# Patient Record
Sex: Male | Born: 1940
Health system: Southern US, Community
[De-identification: ages and names within clinical notes are randomized; demographics above are authoritative.]

## PROBLEM LIST (undated history)

## (undated) ENCOUNTER — Emergency Department (HOSPITAL_COMMUNITY): Admission: EM | Payer: Medicare HMO | Source: Home / Self Care | Admitting: Urology

## (undated) DIAGNOSIS — E890 Postprocedural hypothyroidism: Secondary | ICD-10-CM

## (undated) DIAGNOSIS — N390 Urinary tract infection, site not specified: Secondary | ICD-10-CM

## (undated) DIAGNOSIS — I5020 Unspecified systolic (congestive) heart failure: Secondary | ICD-10-CM

## (undated) DIAGNOSIS — Z9009 Acquired absence of other part of head and neck: Secondary | ICD-10-CM

## (undated) DIAGNOSIS — I10 Essential (primary) hypertension: Secondary | ICD-10-CM

## (undated) DIAGNOSIS — I5032 Chronic diastolic (congestive) heart failure: Secondary | ICD-10-CM

## (undated) DIAGNOSIS — Z87442 Personal history of urinary calculi: Secondary | ICD-10-CM

## (undated) DIAGNOSIS — Z9089 Acquired absence of other organs: Secondary | ICD-10-CM

## (undated) DIAGNOSIS — Z9889 Other specified postprocedural states: Secondary | ICD-10-CM

## (undated) DIAGNOSIS — E039 Hypothyroidism, unspecified: Secondary | ICD-10-CM

## (undated) DIAGNOSIS — A419 Sepsis, unspecified organism: Secondary | ICD-10-CM

## (undated) DIAGNOSIS — I493 Ventricular premature depolarization: Secondary | ICD-10-CM

## (undated) DIAGNOSIS — R7303 Prediabetes: Secondary | ICD-10-CM

## (undated) DIAGNOSIS — J302 Other seasonal allergic rhinitis: Secondary | ICD-10-CM

## (undated) DIAGNOSIS — N400 Enlarged prostate without lower urinary tract symptoms: Secondary | ICD-10-CM

## (undated) DIAGNOSIS — C61 Malignant neoplasm of prostate: Secondary | ICD-10-CM

## (undated) DIAGNOSIS — N529 Male erectile dysfunction, unspecified: Secondary | ICD-10-CM

## (undated) DIAGNOSIS — E785 Hyperlipidemia, unspecified: Secondary | ICD-10-CM

## (undated) HISTORY — PX: PROSTATE SURGERY: SHX751

## (undated) HISTORY — DX: Unspecified systolic (congestive) heart failure: I50.20

## (undated) HISTORY — DX: Chronic diastolic (congestive) heart failure: I50.32

## (undated) HISTORY — DX: Urinary tract infection, site not specified: N39.0

## (undated) HISTORY — DX: Male erectile dysfunction, unspecified: N52.9

## (undated) HISTORY — DX: Hyperlipidemia, unspecified: E78.5

## (undated) HISTORY — DX: Essential (primary) hypertension: I10

## (undated) HISTORY — DX: Sepsis, unspecified organism: A41.9

## (undated) HISTORY — DX: Other seasonal allergic rhinitis: J30.2

## (undated) HISTORY — DX: Ventricular premature depolarization: I49.3

## (undated) HISTORY — DX: Malignant neoplasm of prostate: C61

## (undated) HISTORY — DX: Hypothyroidism, unspecified: E03.9

---

## 2001-10-14 HISTORY — PX: OTHER SURGICAL HISTORY: SHX169

## 2001-10-14 HISTORY — PX: THYROIDECTOMY: SHX17

## 2001-12-24 ENCOUNTER — Ambulatory Visit (HOSPITAL_COMMUNITY): Admission: RE | Admit: 2001-12-24 | Discharge: 2001-12-24 | Payer: Self-pay | Admitting: Family Medicine

## 2001-12-24 ENCOUNTER — Encounter: Payer: Self-pay | Admitting: Family Medicine

## 2001-12-29 ENCOUNTER — Ambulatory Visit (HOSPITAL_COMMUNITY): Admission: RE | Admit: 2001-12-29 | Discharge: 2001-12-29 | Payer: Self-pay | Admitting: Family Medicine

## 2001-12-29 ENCOUNTER — Encounter: Payer: Self-pay | Admitting: Family Medicine

## 2002-01-12 ENCOUNTER — Ambulatory Visit (HOSPITAL_COMMUNITY): Admission: RE | Admit: 2002-01-12 | Discharge: 2002-01-12 | Payer: Self-pay | Admitting: General Surgery

## 2002-01-13 ENCOUNTER — Encounter: Payer: Self-pay | Admitting: General Surgery

## 2002-03-16 ENCOUNTER — Encounter: Payer: Self-pay | Admitting: Internal Medicine

## 2002-03-16 ENCOUNTER — Inpatient Hospital Stay (HOSPITAL_COMMUNITY): Admission: AD | Admit: 2002-03-16 | Discharge: 2002-03-25 | Payer: Self-pay | Admitting: Internal Medicine

## 2002-03-17 ENCOUNTER — Encounter: Payer: Self-pay | Admitting: Internal Medicine

## 2002-03-20 ENCOUNTER — Encounter: Payer: Self-pay | Admitting: Internal Medicine

## 2003-01-31 ENCOUNTER — Ambulatory Visit (HOSPITAL_COMMUNITY): Admission: RE | Admit: 2003-01-31 | Discharge: 2003-01-31 | Payer: Self-pay | Admitting: Family Medicine

## 2003-01-31 ENCOUNTER — Encounter: Payer: Self-pay | Admitting: Family Medicine

## 2003-02-01 ENCOUNTER — Encounter: Payer: Self-pay | Admitting: Family Medicine

## 2003-02-01 ENCOUNTER — Ambulatory Visit (HOSPITAL_COMMUNITY): Admission: RE | Admit: 2003-02-01 | Discharge: 2003-02-01 | Payer: Self-pay | Admitting: Family Medicine

## 2004-09-28 ENCOUNTER — Ambulatory Visit: Payer: Self-pay | Admitting: Family Medicine

## 2004-11-21 ENCOUNTER — Ambulatory Visit: Payer: Self-pay | Admitting: Family Medicine

## 2005-01-02 ENCOUNTER — Ambulatory Visit: Payer: Self-pay | Admitting: Family Medicine

## 2005-05-16 ENCOUNTER — Ambulatory Visit (HOSPITAL_COMMUNITY): Admission: RE | Admit: 2005-05-16 | Discharge: 2005-05-16 | Payer: Self-pay | Admitting: General Surgery

## 2005-05-16 LAB — HM COLONOSCOPY: HM Colonoscopy: NORMAL

## 2005-06-03 ENCOUNTER — Ambulatory Visit: Payer: Self-pay | Admitting: Family Medicine

## 2005-07-23 ENCOUNTER — Ambulatory Visit: Payer: Self-pay | Admitting: Family Medicine

## 2005-11-22 ENCOUNTER — Ambulatory Visit: Payer: Self-pay | Admitting: Family Medicine

## 2006-04-17 ENCOUNTER — Encounter (INDEPENDENT_AMBULATORY_CARE_PROVIDER_SITE_OTHER): Payer: Self-pay | Admitting: *Deleted

## 2006-04-17 LAB — CONVERTED CEMR LAB: PSA: 3.42 ng/mL

## 2006-05-09 ENCOUNTER — Ambulatory Visit: Payer: Self-pay | Admitting: Family Medicine

## 2006-06-05 ENCOUNTER — Ambulatory Visit: Payer: Self-pay | Admitting: Family Medicine

## 2006-08-12 ENCOUNTER — Ambulatory Visit: Payer: Self-pay | Admitting: Family Medicine

## 2006-11-19 ENCOUNTER — Encounter: Payer: Self-pay | Admitting: Family Medicine

## 2006-11-19 LAB — CONVERTED CEMR LAB
ALT: 17 units/L (ref 0–53)
Basophils Absolute: 0 10*3/uL (ref 0.0–0.1)
CO2: 27 meq/L (ref 19–32)
Creatinine, Ser: 1.29 mg/dL (ref 0.40–1.50)
Eosinophils Absolute: 0.2 10*3/uL (ref 0.0–0.7)
HCT: 48.8 % (ref 39.0–52.0)
HDL: 49 mg/dL (ref >39)
Hemoglobin: 15.9 g/dL (ref 13.0–17.0)
LDL Cholesterol: 62 mg/dL (ref 0–99)
MCHC: 32.6 g/dL (ref 30.0–36.0)
Monocytes Absolute: 0.5 10*3/uL (ref 0.2–0.7)
Monocytes Relative: 8 % (ref 3–11)
Neutrophils Relative %: 60 % (ref 43–77)
TSH: 4.352 microintl units/mL (ref 0.350–5.50)
Total CHOL/HDL Ratio: 2.7
Triglycerides: 100 mg/dL (ref <150)
VLDL: 20 mg/dL (ref 0–40)

## 2006-11-25 ENCOUNTER — Ambulatory Visit: Payer: Self-pay | Admitting: Family Medicine

## 2006-12-04 ENCOUNTER — Ambulatory Visit: Payer: Self-pay | Admitting: Internal Medicine

## 2006-12-08 ENCOUNTER — Ambulatory Visit: Payer: Self-pay | Admitting: Internal Medicine

## 2006-12-08 ENCOUNTER — Ambulatory Visit (HOSPITAL_COMMUNITY): Admission: RE | Admit: 2006-12-08 | Discharge: 2006-12-08 | Payer: Self-pay | Admitting: Internal Medicine

## 2007-03-30 ENCOUNTER — Encounter: Payer: Self-pay | Admitting: Family Medicine

## 2007-03-30 LAB — CONVERTED CEMR LAB
BUN: 14 mg/dL
CO2: 26 meq/L
Calcium: 9 mg/dL
Chloride: 103 meq/L
Creatinine, Ser: 1.09 mg/dL
Glucose, Bld: 108 mg/dL — ABNORMAL HIGH
Potassium: 4.1 meq/L
Sodium: 141 meq/L
TSH: 3.889 u[IU]/mL

## 2007-04-07 ENCOUNTER — Ambulatory Visit: Payer: Self-pay | Admitting: Family Medicine

## 2007-07-09 ENCOUNTER — Encounter: Payer: Self-pay | Admitting: Family Medicine

## 2007-07-09 LAB — CONVERTED CEMR LAB
Albumin: 4.2 g/dL (ref 3.5–5.2)
BUN: 13 mg/dL (ref 6–23)
Calcium: 9.2 mg/dL (ref 8.4–10.5)
Glucose, Bld: 112 mg/dL — ABNORMAL HIGH (ref 70–99)
Indirect Bilirubin: 0.7 mg/dL (ref 0.0–0.9)
TSH: 7.244 microintl units/mL — ABNORMAL HIGH (ref 0.350–5.50)
Total Bilirubin: 0.8 mg/dL (ref 0.3–1.2)

## 2007-07-14 ENCOUNTER — Ambulatory Visit: Payer: Self-pay | Admitting: Family Medicine

## 2007-09-15 ENCOUNTER — Encounter: Payer: Self-pay | Admitting: Family Medicine

## 2007-09-15 LAB — CONVERTED CEMR LAB: TSH: 4.35 microintl units/mL (ref 0.350–5.50)

## 2007-10-15 ENCOUNTER — Encounter: Payer: Self-pay | Admitting: Family Medicine

## 2007-11-11 ENCOUNTER — Encounter: Payer: Self-pay | Admitting: Family Medicine

## 2007-11-11 LAB — CONVERTED CEMR LAB
AST: 16 units/L (ref 0–37)
Alkaline Phosphatase: 59 units/L (ref 39–117)
Bilirubin, Direct: 0.2 mg/dL (ref 0.0–0.3)
Calcium: 9.5 mg/dL (ref 8.4–10.5)
Chloride: 103 meq/L (ref 96–112)
Indirect Bilirubin: 0.7 mg/dL (ref 0.0–0.9)
Potassium: 4 meq/L (ref 3.5–5.3)
Total Bilirubin: 0.9 mg/dL (ref 0.3–1.2)
Triglycerides: 114 mg/dL (ref <150)
VLDL: 23 mg/dL (ref 0–40)

## 2007-11-13 ENCOUNTER — Encounter: Payer: Self-pay | Admitting: Family Medicine

## 2007-11-13 LAB — CONVERTED CEMR LAB: Glucose, 2 hour: 197 mg/dL — ABNORMAL HIGH (ref 70–139)

## 2007-11-17 ENCOUNTER — Ambulatory Visit: Payer: Self-pay | Admitting: Family Medicine

## 2007-12-29 ENCOUNTER — Ambulatory Visit: Payer: Self-pay | Admitting: Family Medicine

## 2007-12-29 ENCOUNTER — Encounter (INDEPENDENT_AMBULATORY_CARE_PROVIDER_SITE_OTHER): Payer: Self-pay | Admitting: *Deleted

## 2008-01-08 ENCOUNTER — Encounter (INDEPENDENT_AMBULATORY_CARE_PROVIDER_SITE_OTHER): Payer: Self-pay | Admitting: *Deleted

## 2008-01-08 DIAGNOSIS — I10 Essential (primary) hypertension: Secondary | ICD-10-CM | POA: Insufficient documentation

## 2008-01-08 DIAGNOSIS — J301 Allergic rhinitis due to pollen: Secondary | ICD-10-CM | POA: Insufficient documentation

## 2008-01-08 DIAGNOSIS — E785 Hyperlipidemia, unspecified: Secondary | ICD-10-CM | POA: Insufficient documentation

## 2008-01-08 DIAGNOSIS — E039 Hypothyroidism, unspecified: Secondary | ICD-10-CM | POA: Insufficient documentation

## 2008-01-08 DIAGNOSIS — F528 Other sexual dysfunction not due to a substance or known physiological condition: Secondary | ICD-10-CM

## 2008-03-16 ENCOUNTER — Encounter: Payer: Self-pay | Admitting: Family Medicine

## 2008-03-16 ENCOUNTER — Ambulatory Visit: Payer: Self-pay | Admitting: Family Medicine

## 2008-03-16 LAB — CONVERTED CEMR LAB
AST: 14 units/L (ref 0–37)
BUN: 19 mg/dL (ref 6–23)
Basophils Absolute: 0 10*3/uL (ref 0.0–0.1)
Basophils Relative: 1 % (ref 0–1)
Chloride: 105 meq/L (ref 96–112)
Cholesterol: 154 mg/dL (ref 0–200)
Creatinine, Ser: 1.22 mg/dL (ref 0.40–1.50)
Eosinophils Absolute: 0.2 10*3/uL (ref 0.0–0.7)
Eosinophils Relative: 5 % (ref 0–5)
HDL: 51 mg/dL (ref >39)
Hemoglobin: 15.2 g/dL (ref 13.0–17.0)
Indirect Bilirubin: 0.5 mg/dL (ref 0.0–0.9)
LDL Cholesterol: 85 mg/dL (ref 0–99)
MCHC: 31.7 g/dL (ref 30.0–36.0)
MCV: 85.3 fL (ref 78.0–100.0)
Monocytes Absolute: 0.3 10*3/uL (ref 0.1–1.0)
Neutro Abs: 2.6 10*3/uL (ref 1.7–7.7)
RDW: 14.7 % (ref 11.5–15.5)
Sodium: 144 meq/L (ref 135–145)
Total Bilirubin: 0.6 mg/dL (ref 0.3–1.2)
Total CHOL/HDL Ratio: 3

## 2008-05-19 ENCOUNTER — Encounter: Payer: Self-pay | Admitting: Family Medicine

## 2008-06-27 ENCOUNTER — Ambulatory Visit: Payer: Self-pay | Admitting: Family Medicine

## 2008-07-21 ENCOUNTER — Encounter: Payer: Self-pay | Admitting: Family Medicine

## 2008-07-26 ENCOUNTER — Encounter: Payer: Self-pay | Admitting: Family Medicine

## 2008-08-10 ENCOUNTER — Encounter: Payer: Self-pay | Admitting: Family Medicine

## 2008-08-11 ENCOUNTER — Encounter: Payer: Self-pay | Admitting: Family Medicine

## 2008-08-24 ENCOUNTER — Encounter: Payer: Self-pay | Admitting: Family Medicine

## 2008-09-14 ENCOUNTER — Encounter: Payer: Self-pay | Admitting: Family Medicine

## 2008-10-05 ENCOUNTER — Encounter: Payer: Self-pay | Admitting: Family Medicine

## 2008-10-21 ENCOUNTER — Encounter: Payer: Self-pay | Admitting: Family Medicine

## 2008-10-31 ENCOUNTER — Encounter: Payer: Self-pay | Admitting: Family Medicine

## 2008-10-31 LAB — CONVERTED CEMR LAB
ALT: 14 units/L (ref 0–53)
AST: 19 units/L (ref 0–37)
Alkaline Phosphatase: 61 units/L (ref 39–117)
BUN: 16 mg/dL (ref 6–23)
CO2: 21 meq/L (ref 19–32)
Calcium: 9.5 mg/dL (ref 8.4–10.5)
Chloride: 103 meq/L (ref 96–112)
Cholesterol: 150 mg/dL (ref 0–200)
Creatinine, Ser: 1.02 mg/dL (ref 0.40–1.50)
Glucose, Bld: 104 mg/dL — ABNORMAL HIGH (ref 70–99)
TSH: 3.176 microintl units/mL (ref 0.350–4.50)
Total Bilirubin: 1.3 mg/dL — ABNORMAL HIGH (ref 0.3–1.2)
Triglycerides: 84 mg/dL (ref <150)

## 2008-11-04 ENCOUNTER — Ambulatory Visit: Payer: Self-pay | Admitting: Family Medicine

## 2008-11-04 LAB — CONVERTED CEMR LAB
Glucose, Bld: 138 mg/dL
Hgb A1c MFr Bld: 6 %

## 2008-12-21 ENCOUNTER — Encounter: Payer: Self-pay | Admitting: Family Medicine

## 2009-02-02 ENCOUNTER — Encounter: Payer: Self-pay | Admitting: Family Medicine

## 2009-02-02 LAB — CONVERTED CEMR LAB: TSH: 2.599 microintl units/mL (ref 0.350–4.500)

## 2009-02-06 ENCOUNTER — Encounter: Payer: Self-pay | Admitting: Family Medicine

## 2009-02-07 ENCOUNTER — Encounter: Payer: Self-pay | Admitting: Family Medicine

## 2009-02-09 ENCOUNTER — Encounter: Payer: Self-pay | Admitting: Family Medicine

## 2009-03-08 ENCOUNTER — Encounter: Payer: Self-pay | Admitting: Family Medicine

## 2009-03-09 ENCOUNTER — Ambulatory Visit: Payer: Self-pay | Admitting: Family Medicine

## 2009-03-09 LAB — CONVERTED CEMR LAB: Blood Glucose, Fasting: 99 mg/dL

## 2009-03-10 ENCOUNTER — Encounter: Payer: Self-pay | Admitting: Family Medicine

## 2009-03-10 LAB — CONVERTED CEMR LAB: Microalb, Ur: 2.01 mg/dL — ABNORMAL HIGH (ref 0.00–1.89)

## 2009-05-02 ENCOUNTER — Encounter: Payer: Self-pay | Admitting: Family Medicine

## 2009-05-02 LAB — CONVERTED CEMR LAB
AST: 15 units/L (ref 0–37)
Basophils Relative: 1 % (ref 0–1)
Bilirubin, Direct: 0.2 mg/dL (ref 0.0–0.3)
Calcium: 8.9 mg/dL (ref 8.4–10.5)
Chloride: 104 meq/L (ref 96–112)
Eosinophils Relative: 4 % (ref 0–5)
Glucose, Bld: 99 mg/dL (ref 70–99)
HDL: 54 mg/dL (ref >39)
Hemoglobin: 15 g/dL (ref 13.0–17.0)
LDL Cholesterol: 73 mg/dL (ref 0–99)
Lymphs Abs: 1.9 10*3/uL (ref 0.7–4.0)
MCHC: 32.1 g/dL (ref 30.0–36.0)
MCV: 85.1 fL (ref 78.0–100.0)
Monocytes Absolute: 0.5 10*3/uL (ref 0.1–1.0)
Monocytes Relative: 8 % (ref 3–12)
Neutro Abs: 3.1 10*3/uL (ref 1.7–7.7)
Neutrophils Relative %: 54 % (ref 43–77)
Potassium: 4.3 meq/L (ref 3.5–5.3)
RBC: 5.49 M/uL (ref 4.22–5.81)
Total Bilirubin: 1 mg/dL (ref 0.3–1.2)
Total CHOL/HDL Ratio: 2.6
Triglycerides: 79 mg/dL (ref <150)
VLDL: 16 mg/dL (ref 0–40)
WBC: 5.7 10*3/uL (ref 4.0–10.5)

## 2009-06-05 ENCOUNTER — Encounter: Payer: Self-pay | Admitting: Family Medicine

## 2009-07-11 ENCOUNTER — Ambulatory Visit: Payer: Self-pay | Admitting: Family Medicine

## 2009-07-11 LAB — CONVERTED CEMR LAB
Glucose, Bld: 115 mg/dL
Hgb A1c MFr Bld: 6.2 %

## 2009-08-01 ENCOUNTER — Encounter: Payer: Self-pay | Admitting: Family Medicine

## 2009-08-02 ENCOUNTER — Encounter: Payer: Self-pay | Admitting: Family Medicine

## 2009-10-14 DIAGNOSIS — C61 Malignant neoplasm of prostate: Secondary | ICD-10-CM

## 2009-10-14 HISTORY — DX: Malignant neoplasm of prostate: C61

## 2009-10-17 ENCOUNTER — Ambulatory Visit: Payer: Self-pay | Admitting: Family Medicine

## 2009-10-17 LAB — CONVERTED CEMR LAB
Blood Glucose, Fasting: 117 mg/dL
Hgb A1c MFr Bld: 5.6 %

## 2009-10-18 LAB — CONVERTED CEMR LAB
ALT: 8 units/L (ref 0–53)
Calcium: 9.3 mg/dL (ref 8.4–10.5)
Cholesterol: 163 mg/dL (ref 0–200)
HDL: 60 mg/dL (ref >39)
Indirect Bilirubin: 0.8 mg/dL (ref 0.0–0.9)
LDL Cholesterol: 90 mg/dL (ref 0–99)
Potassium: 4.2 meq/L (ref 3.5–5.3)
TSH: 3.425 microintl units/mL (ref 0.350–4.500)
Total CHOL/HDL Ratio: 2.7
Total Protein: 7.2 g/dL (ref 6.0–8.3)
Triglycerides: 67 mg/dL (ref <150)
VLDL: 13 mg/dL (ref 0–40)

## 2009-10-24 DIAGNOSIS — H6120 Impacted cerumen, unspecified ear: Secondary | ICD-10-CM | POA: Insufficient documentation

## 2009-11-16 ENCOUNTER — Ambulatory Visit (HOSPITAL_COMMUNITY): Admission: RE | Admit: 2009-11-16 | Discharge: 2009-11-16 | Payer: Self-pay | Admitting: Family Medicine

## 2009-12-12 ENCOUNTER — Ambulatory Visit: Admission: RE | Admit: 2009-12-12 | Discharge: 2010-03-12 | Payer: Self-pay | Admitting: Radiation Oncology

## 2010-01-22 ENCOUNTER — Encounter: Payer: Self-pay | Admitting: Family Medicine

## 2010-03-19 ENCOUNTER — Encounter: Payer: Self-pay | Admitting: Family Medicine

## 2010-03-21 ENCOUNTER — Ambulatory Visit: Payer: Self-pay | Admitting: Family Medicine

## 2010-03-21 DIAGNOSIS — R5383 Other fatigue: Secondary | ICD-10-CM

## 2010-03-21 DIAGNOSIS — R5381 Other malaise: Secondary | ICD-10-CM

## 2010-03-21 LAB — CONVERTED CEMR LAB
AST: 14 units/L (ref 0–37)
Albumin: 4.1 g/dL (ref 3.5–5.2)
BUN: 14 mg/dL (ref 6–23)
Basophils Absolute: 0 10*3/uL (ref 0.0–0.1)
Basophils Relative: 0 % (ref 0–1)
Cholesterol: 132 mg/dL (ref 0–200)
Creatinine, Ser: 1.16 mg/dL (ref 0.40–1.50)
Creatinine, Urine: 219.7 mg/dL
Eosinophils Absolute: 0.1 10*3/uL (ref 0.0–0.7)
Eosinophils Relative: 1 % (ref 0–5)
HCT: 43.7 % (ref 39.0–52.0)
HDL: 52 mg/dL (ref >39)
Hemoglobin: 13.9 g/dL (ref 13.0–17.0)
Hgb A1c MFr Bld: 6 % — ABNORMAL HIGH (ref <5.7)
MCV: 87.4 fL (ref 78.0–100.0)
Monocytes Absolute: 0.4 10*3/uL (ref 0.1–1.0)
Neutro Abs: 5.8 10*3/uL (ref 1.7–7.7)
Neutrophils Relative %: 81 % — ABNORMAL HIGH (ref 43–77)
RBC: 5 M/uL (ref 4.22–5.81)
Total Bilirubin: 0.8 mg/dL (ref 0.3–1.2)
Total CHOL/HDL Ratio: 2.5
Total Protein: 7.3 g/dL (ref 6.0–8.3)
VLDL: 16 mg/dL (ref 0–40)
Vit D, 25-Hydroxy: 29 ng/mL — ABNORMAL LOW (ref 30–89)

## 2010-03-21 LAB — HM DIABETES FOOT EXAM

## 2010-03-25 DIAGNOSIS — C61 Malignant neoplasm of prostate: Secondary | ICD-10-CM

## 2010-05-08 ENCOUNTER — Telehealth: Payer: Self-pay | Admitting: Family Medicine

## 2010-07-11 ENCOUNTER — Encounter: Payer: Self-pay | Admitting: Family Medicine

## 2010-07-18 ENCOUNTER — Telehealth: Payer: Self-pay | Admitting: Family Medicine

## 2010-07-24 ENCOUNTER — Encounter: Payer: Self-pay | Admitting: Family Medicine

## 2010-07-25 ENCOUNTER — Ambulatory Visit: Payer: Self-pay | Admitting: Family Medicine

## 2010-07-25 DIAGNOSIS — R7303 Prediabetes: Secondary | ICD-10-CM

## 2010-07-26 LAB — CONVERTED CEMR LAB
BUN: 8 mg/dL (ref 6–23)
CO2: 27 meq/L (ref 19–32)
Cholesterol: 137 mg/dL (ref 0–200)
LDL Cholesterol: 64 mg/dL (ref 0–99)
Sodium: 141 meq/L (ref 135–145)
Total Bilirubin: 0.8 mg/dL (ref 0.3–1.2)
Triglycerides: 66 mg/dL (ref <150)
VLDL: 13 mg/dL (ref 0–40)

## 2010-08-23 ENCOUNTER — Telehealth: Payer: Self-pay | Admitting: Family Medicine

## 2010-09-20 ENCOUNTER — Encounter: Payer: Self-pay | Admitting: Family Medicine

## 2010-10-29 ENCOUNTER — Ambulatory Visit
Admission: RE | Admit: 2010-10-29 | Discharge: 2010-10-29 | Payer: Self-pay | Source: Home / Self Care | Attending: Family Medicine | Admitting: Family Medicine

## 2010-10-29 DIAGNOSIS — N39498 Other specified urinary incontinence: Secondary | ICD-10-CM | POA: Insufficient documentation

## 2010-11-07 LAB — CONVERTED CEMR LAB: TSH: 2.363 microintl units/mL (ref 0.350–4.500)

## 2010-11-13 NOTE — Letter (Signed)
Summary: MEDICAL RELEASE  MEDICAL RELEASE   Imported By: Lind Guest 01/22/2010 13:22:05  _____________________________________________________________________  External Attachment:    Type:   Image     Comment:   External Document

## 2010-11-13 NOTE — Letter (Signed)
Summary: labs  labs   Imported By: Curtis Sites 03/02/2010 12:45:01  _____________________________________________________________________  External Attachment:    Type:   Image     Comment:   External Document

## 2010-11-13 NOTE — Letter (Signed)
Summary: consults  consults   Imported By: Curtis Sites 03/02/2010 12:37:37  _____________________________________________________________________  External Attachment:    Type:   Image     Comment:   External Document

## 2010-11-13 NOTE — Miscellaneous (Signed)
Summary: samples  Clinical Lists Changes     viagra samples given lot [id number] exp 07/15

## 2010-11-13 NOTE — Progress Notes (Signed)
Summary: medication  Phone Note Call from Patient   Summary of Call: [facility]IENT [facility] klor con m20  20 meq, he would like the 90 day supply for this  their number is [phone], [phone]  Initial call taken by: Curtis Sites,  August 23, 2010 3:13 PM  Follow-up for Phone Call        prescription entered historically, pls print and fax tioo the phar of his choice after you spk with him Follow-up by: Syliva Overman MD,  August 23, 2010 5:23 PM    New/Updated Medications: KLOR-CON M20 20 MEQ CR-TABS (POTASSIUM CHLORIDE CRYS CR) Take 1 tablet by mouth once a day Prescriptions: KLOR-CON M20 20 MEQ CR-TABS (POTASSIUM CHLORIDE CRYS CR) Take 1 tablet by mouth once a day  #90 x 3   Entered by:   Adella Hare LPN   Authorized by:   Syliva Overman MD   Signed by:   Adella Hare LPN on 84/69/6295   Method used:   Faxed to ...       [facility]             , [place]         Ph:        Fax: [phone]   RxID:   [id number] KLOR-CON M20 20 MEQ CR-TABS (POTASSIUM CHLORIDE CRYS CR) Take 1 tablet by mouth once a day  #90 x 3   Entered and Authorized by:   Syliva Overman MD   Signed by:   Syliva Overman MD on 08/23/2010   Method used:   Historical   RxID:   [id number]

## 2010-11-13 NOTE — Miscellaneous (Signed)
  Clinical Lists Changes  Medications: Removed medication of METFORMIN HCL 500 MG  TB24 (METFORMIN HCL) one tab by mouth once daily - Signed Added new medication of METFORMIN HCL 500 MG TABS (METFORMIN HCL) Take 1 tablet by mouth once a day - Signed Rx of METFORMIN HCL 500 MG TABS (METFORMIN HCL) Take 1 tablet by mouth once a day;  #90 x 3;  Signed;  Entered by: Syliva Overman MD;  Authorized by: Syliva Overman MD;  Method used: Printed then faxed to [facility], , , [place]  , Ph: , Fax: [phone] Rx of METFORMIN HCL 500 MG TABS (METFORMIN HCL) Take 1 tablet by mouth once a day;  #90 x 3;  Signed;  Entered by: Everitt Amber LPN;  Authorized by: Syliva Overman MD;  Method used: Faxed to [facility], , , [place]  , Ph: [phone], Fax: [phone]    Prescriptions: METFORMIN HCL 500 MG TABS (METFORMIN HCL) Take 1 tablet by mouth once a day  #90 x 3   Entered by:   Everitt Amber LPN   Authorized by:   Syliva Overman MD   Signed by:   Everitt Amber LPN on 75/64/3329   Method used:   Faxed to ...       [facility] (mail-order)             , [place]         Ph: [phone]       Fax: [phone]   RxID:   [id number] METFORMIN HCL 500 MG TABS (METFORMIN HCL) Take 1 tablet by mouth once a day  #90 x 3   Entered and Authorized by:   Syliva Overman MD   Signed by:   Syliva Overman MD on 07/24/2010   Method used:   Printed then faxed to ...       [facility]             , [place]         Ph:        Fax: [phone]   RxID:   [id number]  pls fax new rx for metformin to [facility] and notify thanks

## 2010-11-13 NOTE — Letter (Signed)
Summary: progress notes  progress notes   Imported By: Curtis Sites 03/02/2010 12:36:17  _____________________________________________________________________  External Attachment:    Type:   Image     Comment:   External Document

## 2010-11-13 NOTE — Assessment & Plan Note (Signed)
Summary: F UP   Vital Signs:  Patient profile:   70 year old male Height:      65 inches Weight:      158.50 pounds BMI:     26.47 O2 Sat:      98 % Pulse rate:   92 / minute Pulse rhythm:   regular Resp:     16 per minute BP sitting:   116 / 82  (left arm) Cuff size:   regular  Vitals Entered By: Syliva Overman MD (March 21, 2010 10:17 AM)  Nutrition Counseling: Patient's BMI is greater than 25 and therefore counseled on weight management options. CC: Follow up chronic problems, had some radiation treatment and still having the burning around his prostate but they say every thing is good   Primary Care Provider:  Kerri Perches MD  CC:  Follow up chronic problems and had some radiation treatment and still having the burning around his prostate but they say every thing is good.  History of Present Illness: pt reports thart he just completed an 8 week radiation treatment schedule of radiation for prostate cancer and c/o burning with both urination and defecation, no fever or chills and frequent urination . states that he was told this was expeected and would continue for a few more months. he has been weak, apetite fluictuates  Current Medications (verified): 1)  Klor-Con M20 20 Meq  Tbcr (Potassium Chloride Crys Cr) .... One Tab By Mouth Once Daily 2)  Hydrochlorothiazide 25 Mg  Tabs (Hydrochlorothiazide) .... One Tab By Mouth Once Daily 3)  Norvasc 10 Mg  Tabs (Amlodipine Besylate) .... One Tab By Mouth Once Daily 4)  Simvastatin 20 Mg  Tabs (Simvastatin) .... One Tab By Mouth At Bedtime 5)  Synthroid 75 Mcg  Tabs (Levothyroxine Sodium) .... One Tab By Mouth Once Daily 6)  Metformin Hcl 500 Mg  Tb24 (Metformin Hcl) .... One Tab By Mouth Once Daily 7)  Zyrtec Allergy 10 Mg  Tabs (Cetirizine Hcl) .... One Tab By Mouth Once Daily 8)  Multivitamins  Tabs (Multiple Vitamin) .... One Tab By Mouth Once Daily 9)  Flomax 0.4 Mg Caps (Tamsulosin Hcl) .... Take 1 Tablet By Mouth  Once A Day  Allergies (verified): 1)  ! Pcn 2)  ! * Eggs  Past History:  Past Medical History: Current Problems:  ALLERGIC RHINITIS, SEASONAL (ICD-477.0) ERECTILE DYSFUNCTION (ICD-302.72) UNSPECIFIED HYPOTHYROIDISM (ICD-244.9) HYPERLIPIDEMIA (ICD-272.4) HYPERTENSION (ICD-401.9) DIABETES MELLITUS, TYPE II (ICD-250.00) Prostate cancer dx 2011treated with radiaition  Review of Systems      See HPI General:  Complains of fatigue and malaise; denies chills and fever. Eyes:  Denies discharge and red eye. ENT:  Denies hoarseness, nasal congestion, and sinus pressure. CV:  Denies chest pain or discomfort, difficulty breathing while lying down, palpitations, shortness of breath with exertion, and swelling of feet. Resp:  Denies cough and sputum productive. GI:  Complains of loss of appetite; denies abdominal pain, constipation, diarrhea, nausea, and vomiting. GU:  Denies dysuria, urinary frequency, and urinary hesitancy. MS:  Complains of joint pain and stiffness. Derm:  Denies itching and rash. Neuro:  Denies headaches and seizures. Psych:  Denies anxiety and depression. Endo:  Denies cold intolerance, excessive hunger, excessive thirst, excessive urination, heat intolerance, polyuria, and weight change. Heme:  Denies abnormal bruising and bleeding. Allergy:  Denies hives or rash and itching eyes.  Physical Exam  General:  Well-developed,well-nourished,in no acute distress; alert,appropriate and cooperative throughout examination HEENT: No facial asymmetry,  EOMI, No sinus tenderness, cerumen i[paction, oropharynx  pink and moist.   Chest: Clear to auscultation bilaterally.  CVS: S1, S2, No murmurs, No S3.   Abd: Soft, Nontender.  MS: Adequate ROM spine, hips, shoulders and knees.  Ext: No edema.   CNS: CN 2-12 intact, power tone and sensation normal throughout.   Skin: Intact, no visible lesions or rashes.  Psych: Good eye contact, normal affect.  Memory intact, not anxious or  depressed appearing.    Diabetes Management Exam:    Foot Exam (with socks and/or shoes not present):       Sensory-Monofilament:          Left foot: normal          Right foot: normal       Inspection:          Left foot: normal          Right foot: normal       Nails:          Left foot: thickened          Right foot: thickened   Impression & Recommendations:  Problem # 1:  UNSPECIFIED HYPOTHYROIDISM (ICD-244.9) Assessment Comment Only  His updated medication list for this problem includes:    Synthroid 75 Mcg Tabs (Levothyroxine sodium) ..... One tab by mouth once daily  Labs Reviewed: TSH: 3.425 (10/17/2009)    HgBA1c: 5.6 (10/17/2009) Chol: 163 (10/17/2009)   HDL: 60 (10/17/2009)   LDL: 90 (10/17/2009)   TG: 67 (10/17/2009)  Problem # 2:  DIABETES MELLITUS, TYPE II (ICD-250.00) Assessment: Comment Only  His updated medication list for this problem includes:    Metformin Hcl 500 Mg Tb24 (Metformin hcl) ..... One tab by mouth once daily  Orders: T-Urine Microalbumin w/creat. ratio [phone])  Labs Reviewed: Creat: 1.09 (10/17/2009)    Reviewed HgBA1c results: 5.6 (10/17/2009)  6.2 (07/11/2009)  Problem # 3:  HYPERTENSION (ICD-401.9) Assessment: Unchanged  His updated medication list for this problem includes:    Hydrochlorothiazide 25 Mg Tabs (Hydrochlorothiazide) ..... One tab by mouth once daily    Norvasc 10 Mg Tabs (Amlodipine besylate) ..... One tab by mouth once daily  Orders: T-Basic Metabolic Panel ([id number])  BP today: 116/82 Prior BP: 110/86 (10/17/2009)  Labs Reviewed: K+: 4.2 (10/17/2009) Creat: : 1.09 (10/17/2009)   Chol: 163 (10/17/2009)   HDL: 60 (10/17/2009)   LDL: 90 (10/17/2009)   TG: 67 (10/17/2009)  Problem # 4:  HYPERLIPIDEMIA (ICD-272.4) Assessment: Comment Only  His updated medication list for this problem includes:    Simvastatin 20 Mg Tabs (Simvastatin) ..... One tab by mouth at bedtime  Orders: T-Hepatic  Function [phone]) T-Lipid Profile [phone])  Labs Reviewed: SGOT: 13 (10/17/2009)   SGPT: 8 (10/17/2009)   HDL:60 (10/17/2009), 54 (05/02/2009)  LDL:90 (10/17/2009), 73 (05/02/2009)  Chol:163 (10/17/2009), 143 (05/02/2009)  Trig:67 (10/17/2009), 79 (05/02/2009)  Problem # 5:  PROSTATE CANCER (ICD-185) Assessment: Comment Only dx and treeated with s weeks of radiaition therapy in 2011, followed by urology and oncology  Complete Medication List: 1)  Klor-con M20 20 Meq Tbcr (Potassium chloride crys cr) .... One tab by mouth once daily 2)  Hydrochlorothiazide 25 Mg Tabs (Hydrochlorothiazide) .... One tab by mouth once daily 3)  Norvasc 10 Mg Tabs (Amlodipine besylate) .... One tab by mouth once daily 4)  Simvastatin 20 Mg Tabs (Simvastatin) .... One tab by mouth at bedtime 5)  Synthroid 75 Mcg Tabs (Levothyroxine sodium) .... One tab by mouth once daily  6)  Metformin Hcl 500 Mg Tb24 (Metformin hcl) .... One tab by mouth once daily 7)  Zyrtec Allergy 10 Mg Tabs (Cetirizine hcl) .... One tab by mouth once daily 8)  Multivitamins Tabs (Multiple vitamin) .... One tab by mouth once daily 9)  Flomax 0.4 Mg Caps (Tamsulosin hcl) .... Take 1 tablet by mouth once a day  Other Orders: T-CBC w/Diff ([id number]) T-TSH [phone]) T- Hemoglobin A1C ([id number])  Patient Instructions: 1)  Please schedule a follow-up appointment in 4 months. 2)  BMP prior to visit, ICD-9: 3)  Hepatic Panel prior to visit, ICD-9: 4)  Lipid Panel prior to visit, ICD-9:  fasting today 5)  TSH prior to visit, ICD-9: 6)  CBC w/ Diff prior to visit, ICD-9: 7)  HbgA1C prior to visit, ICD-9: 8)  vitamin d 9)  Urine Microalbumin prior to visit, ICD-9:  today

## 2010-11-13 NOTE — Assessment & Plan Note (Signed)
Summary: OFFICE VISIT   Vital Signs:  Patient profile:   70 year old male Height:      65 inches Weight:      157.50 pounds BMI:     26.30 O2 Sat:      99 % Pulse rate:   77 / minute Pulse rhythm:   regular Resp:     16 per minute BP sitting:   110 / 86  (left arm)  Vitals Entered By: Everitt Amber (October 17, 2009 10:01 AM)  Nutrition Counseling: Patient's BMI is greater than 25 and therefore counseled on weight management options. CC: Follow up chronic problems, was told his psa was elevated lastweek but states he has been feeling really good Is Patient Diabetic? Yes   Primary Care Provider:  Kerri Perches MD  CC:  Follow up chronic problems and was told his psa was elevated lastweek but states he has been feeling really good.  History of Present Illness: Reports  thathe has been doing well. Denies recent fever or chills. Denies sinus pressure, nasal congestion , ear pain or sore throat. Denies chest congestion, or cough productive of sputum. Denies chest pain, palpitations, PND, orthopnea or leg swelling. Denies abdominal pain, nausea, vomitting, diarrhea or constipation. Denies change in bowel movements or bloody stool. Denies dysuria , frequency, incontinence or hesitancy.Still has significant ED symtoms and is barely able to afford the medication. Reports chronic back pain which is unchanged, and which does not adversely affect hismobility. Denies localised joint swelling or instability. Denies headaches, vertigo, seizures. Denies depression, anxiety or insomnia. Denies  rash, lesions, or itch.     Allergies: 1)  ! Pcn 2)  ! * Eggs  Review of Systems      See HPI Eyes:  Denies blurring, discharge, and red eye. Endo:  dfaily fdastings  98 to 105.  Physical Exam  General:  Well-developed,well-nourished,in no acute distress; alert,appropriate and cooperative throughout examination HEENT: No facial asymmetry,  EOMI, No sinus tenderness, cerumen i[paction,  oropharynx  pink and moist.   Chest: Clear to auscultation bilaterally.  CVS: S1, S2, No murmurs, No S3.   Abd: Soft, Nontender.  MS: Adequate ROM spine, hips, shoulders and knees.  Ext: No edema.   CNS: CN 2-12 intact, power tone and sensation normal throughout.   Skin: Intact, no visible lesions or rashes.  Psych: Good eye contact, normal affect.  Memory intact, not anxious or depressed appearing.    Diabetes Management Exam:    Foot Exam (with socks and/or shoes not present):       Sensory-Monofilament:          Left foot: diminished          Right foot: diminished       Inspection:          Left foot: normal          Right foot: normal       Nails:          Left foot: normal          Right foot: thickened   Impression & Recommendations:  Problem # 1:  ERECTILE DYSFUNCTION (ICD-302.72) Assessment Unchanged  The following medications were removed from the medication list:    Cialis 20 Mg Tabs (Tadalafil) ..... One tab by mouth once daily as needed    Viagra 100 Mg Tabs (Sildenafil citrate) ..... Uad    Levitra 20 Mg Tabs (Vardenafil hcl) ..... Uad unable to afford meds, de[pends on samples  and none available  Problem # 2:  HYPERLIPIDEMIA (ICD-272.4) Assessment: Comment Only  His updated medication list for this problem includes:    Simvastatin 20 Mg Tabs (Simvastatin) ..... One tab by mouth at bedtime  Labs Reviewed: SGOT: 15 (05/01/2009)   SGPT: 11 (05/01/2009)   HDL:54 (05/02/2009), 60 (10/31/2008)  LDL:73 (05/02/2009), 73 (10/31/2008)  Chol:143 (05/02/2009), 150 (10/31/2008)  Trig:79 (05/02/2009), 84 (10/31/2008) needs rept labs this month  Problem # 3:  HYPERTENSION (ICD-401.9) Assessment: Unchanged  His updated medication list for this problem includes:    Hydrochlorothiazide 25 Mg Tabs (Hydrochlorothiazide) ..... One tab by mouth once daily    Norvasc 10 Mg Tabs (Amlodipine besylate) ..... One tab by mouth once daily  Orders: CXR- 2view (CXR)  BP  today: 110/86 Prior BP: 110/84 (07/11/2009)  Labs Reviewed: K+: 4.3 (05/01/2009) Creat: : 1.24 (05/01/2009)   Chol: 143 (05/02/2009)   HDL: 54 (05/02/2009)   LDL: 73 (05/02/2009)   TG: 79 (05/02/2009)  Problem # 4:  Gynecological examination-routine (ICD-V72.31)  Problem # 5:  DIABETES MELLITUS, TYPE II (ICD-250.00) Assessment: Improved  His updated medication list for this problem includes:    Metformin Hcl 500 Mg Tb24 (Metformin hcl) ..... One tab by mouth once daily  Orders: Glucose, (CBG) (82962) Hemoglobin A1C (83036)  Labs Reviewed: Creat: 1.24 (05/01/2009)    Reviewed HgBA1c results: 5.6 (10/17/2009)  6.2 (07/11/2009)  Problem # 6:  CERUMEN IMPACTION (ICD-380.4) Assessment: Comment Only pt to use wax remover and return for irrigatrion by nursing  Complete Medication List: 1)  Klor-con M20 20 Meq Tbcr (Potassium chloride crys cr) .... One tab by mouth once daily 2)  Hydrochlorothiazide 25 Mg Tabs (Hydrochlorothiazide) .... One tab by mouth once daily 3)  Norvasc 10 Mg Tabs (Amlodipine besylate) .... One tab by mouth once daily 4)  Simvastatin 20 Mg Tabs (Simvastatin) .... One tab by mouth at bedtime 5)  Synthroid 75 Mcg Tabs (Levothyroxine sodium) .... One tab by mouth once daily 6)  Metformin Hcl 500 Mg Tb24 (Metformin hcl) .... One tab by mouth once daily 7)  Zyrtec Allergy 10 Mg Tabs (Cetirizine hcl) .... One tab by mouth once daily 8)  Multivitamins Tabs (Multiple vitamin) .... One tab by mouth once daily  Patient Instructions: 1)  Please schedule a follow-up appointment in 4.5 months. 2)  It is important that you exercise regularly at least 20 minutes 5 times a week. If you develop chest pain, have severe difficulty breathing, or feel very tired , stop exercising immediately and seek medical attention. 3)  You need to lose weight. Consider a lower calorie diet and regular exercise.  4)  no med  changes. 5)  Happy Birthday  Laboratory Results   Blood Tests     Date/Time Received: October 17, 2009  Date/Time Reported: October 17, 2009   Glucose (fasting): 117 mg/dL   (Normal Range: 57-846) HGBA1C: 5.6%   (Normal Range: Non-Diabetic - 3-6%   Control Diabetic - 6-8%)     Appended Document: OFFICE VISIT pt needed an appt to return for ear irrigation, by nursing, pls schedule and letr him know, in the next 1 to 2 weeks.   Nursing pls go over directions for use of wax softener with the pt prior to his coming in for ear irrigation  Appended Document: OFFICE VISIT patient declines appt for ear irrigation, states he is working on it at home, advised patient not to use Qtips. patient agrees  Appended Document: OFFICE VISIT HE  IS GOING TO DO IT HIMSELF

## 2010-11-13 NOTE — Letter (Signed)
Summary: phone notes  phone notes   Imported By: Curtis Sites 03/02/2010 12:36:55  _____________________________________________________________________  External Attachment:    Type:   Image     Comment:   External Document

## 2010-11-13 NOTE — Letter (Signed)
Summary: history and physical  history and physical   Imported By: Curtis Sites 03/02/2010 12:34:40  _____________________________________________________________________  External Attachment:    Type:   Image     Comment:   External Document

## 2010-11-13 NOTE — Progress Notes (Signed)
Summary: med  Phone Note Call from Patient   Summary of Call: needs to speak with nurse about meds. [phone] Initial call taken by: Rudene Anda,  May 08, 2010 3:29 PM  Follow-up for Phone Call        can you send prescription of cialis Follow-up by: Adella Hare LPN,  May 08, 2010 4:06 PM  Additional Follow-up for Phone Call Additional follow up Details #1::        prescription sent, pls let him know Additional Follow-up by: Syliva Overman MD,  May 08, 2010 4:10 PM    Additional Follow-up for Phone Call Additional follow up Details #2::    Patient aware Follow-up by: Everitt Amber LPN,  May 08, 2010 4:35 PM  New/Updated Medications: CIALIS 20 MG TABS (TADALAFIL) one tablet every 3 days as needed, to be used 30 minutes before intercourse Prescriptions: CIALIS 20 MG TABS (TADALAFIL) one tablet every 3 days as needed, to be used 30 minutes before intercourse  #8 x 4   Entered and Authorized by:   Syliva Overman MD   Signed by:   Syliva Overman MD on 05/08/2010   Method used:   Electronically to        [facility] [address] [phone]* (retail)       [address], [place]  [address]       Ph: [phone]       Fax: [phone]   RxID:   [id number]

## 2010-11-13 NOTE — Letter (Signed)
Summary: misc.  misc.   Imported By: Curtis Sites 03/02/2010 12:45:42  _____________________________________________________________________  External Attachment:    Type:   Image     Comment:   External Document

## 2010-11-13 NOTE — Letter (Signed)
Summary: demographic  demographic   Imported By: Curtis Sites 03/02/2010 12:33:11  _____________________________________________________________________  External Attachment:    Type:   Image     Comment:   External Document

## 2010-11-13 NOTE — Progress Notes (Signed)
Summary: refills  Phone Note Call from Patient   Summary of Call: needs hydrochlorot, metformin, potclmicro and amlodipine and simvastatin sent to [facility]. [phone] Initial call taken by: Rudene Anda,  July 18, 2010 11:46 AM  Follow-up for Phone Call        pls refill x 3 Follow-up by: Syliva Overman MD,  July 18, 2010 12:28 PM  Additional Follow-up for Phone Call Additional follow up Details #1::        [facility] Additional Follow-up by: Everitt Amber LPN,  July 18, 2010 2:11 PM    Prescriptions: METFORMIN HCL 500 MG  TB24 (METFORMIN HCL) one tab by mouth once daily  #90 x 0   Entered by:   Everitt Amber LPN   Authorized by:   Syliva Overman MD   Signed by:   Everitt Amber LPN on 62/13/0865   Method used:   Faxed to ...       [facility] (mail-order)             , [place]         Ph: [phone]       Fax: [phone]   RxID:   [id number] NORVASC 10 MG  TABS (AMLODIPINE BESYLATE) one tab by mouth once daily  #90 x 0   Entered by:   Everitt Amber LPN   Authorized by:   Syliva Overman MD   Signed by:   Everitt Amber LPN on 95/63/8756   Method used:   Faxed to ...       [facility] (mail-order)             , [place]         Ph: [phone]       Fax: [phone]   RxID:   [id number] SIMVASTATIN 20 MG  TABS (SIMVASTATIN) one tab by mouth at bedtime  #90 x 0   Entered by:   Everitt Amber LPN   Authorized by:   Syliva Overman MD   Signed by:   Everitt Amber LPN on 27/03/2375   Method used:   Faxed to ...       [facility] (mail-order)             , [place]         Ph: [phone]       Fax: [phone]   RxID:   [id number] HYDROCHLOROTHIAZIDE 25 MG  TABS (HYDROCHLOROTHIAZIDE) one tab by mouth once daily  #90 x 0   Entered by:   Everitt Amber LPN   Authorized by:   Syliva Overman MD   Signed by:   Everitt Amber LPN on 82/99/3716   Method used:   Faxed to ...       [facility] (mail-order)             ,  [place]         Ph: [phone]       Fax: [phone]   RxID:   [id number] KLOR-CON M20 20 MEQ  TBCR (POTASSIUM CHLORIDE CRYS CR) one tab by mouth once daily  #30.0 Each x 0   Entered by:   Everitt Amber LPN   Authorized by:   Syliva Overman MD   Signed by:   Everitt Amber LPN on 00/86/7619   Method used:   Faxed to ...       [facility] Environmental education officer)             ,  [place]         Ph: [phone]       Fax: [phone]   RxID:   [id number]

## 2010-11-13 NOTE — Progress Notes (Signed)
Summary: [facility]   Imported By: Lind Guest 03/23/2010 09:81:19  _____________________________________________________________________  External Attachment:    Type:   Image     Comment:   External Document

## 2010-11-13 NOTE — Letter (Signed)
Summary: xray  xray   Imported By: Curtis Sites 03/02/2010 12:34:01  _____________________________________________________________________  External Attachment:    Type:   Image     Comment:   External Document

## 2010-11-13 NOTE — Assessment & Plan Note (Signed)
Summary: office visit   Vital Signs:  Patient profile:   70 year old male Height:      65 inches Weight:      162.25 pounds BMI:     27.10 O2 Sat:      97 % on Room air Pulse rate:   112 / minute Pulse rhythm:   regular Resp:     16 per minute BP sitting:   118 / 80  (left arm)  Vitals Entered By: Johny Drilling  Nutrition Counseling: Patient's BMI is greater than 25 and therefore counseled on weight management options.  O2 Flow:  Room air CC: follow up   Primary Care Provider:  Kerri Perches MD  CC:  follow up.  History of Present Illness: Reports  that he has been  doing well. Denies recent fever or chills. Denies sinus pressure, nasal congestion , ear pain or sore throat. Denies chest congestion, or cough productive of sputum. Denies chest pain, palpitations, PND, orthopnea or leg swelling. Denies abdominal pain, nausea, vomitting, diarrhea or constipation. Denies change in bowel movements or bloody stool. Denies dysuria , frequency, incontinence or hesitancy. Intermittent back  pain, with reduced mobility. Denies headaches, vertigo, seizures. Denies depression, anxiety or insomnia. Denies  rash, lesions, or itch.     Allergies: 1)  ! Pcn 2)  ! * Eggs 3)  ! * Peanuts  Review of Systems      See HPI General:  Complains of fatigue. Eyes:  Denies blurring and discharge. MS:  Complains of low back pain and mid back pain. Endo:  Denies excessive thirst and excessive urination. Heme:  Denies abnormal bruising and bleeding. Allergy:  Denies hives or rash and itching eyes.  Physical Exam  General:  Well-developed,well-nourished,in no acute distress; alert,appropriate and cooperative throughout examination HEENT: No facial asymmetry,  EOMI, No sinus tenderness, cerumen i[paction, oropharynx  pink and moist.   Chest: Clear to auscultation bilaterally.  CVS: S1, S2, No murmurs, No S3.   Abd: Soft, Nontender.  MS: Adequate ROM spine, hips, shoulders and  knees.  Ext: No edema.   CNS: CN 2-12 intact, power tone and sensation normal throughout.   Skin: Intact, no visible lesions or rashes.  Psych: Good eye contact, normal affect.  Memory intact, not anxious or depressed appearing.     Impression & Recommendations:  Problem # 1:  PREDIABETES (ICD-790.29) Assessment Comment Only  The following medications were removed from the medication list:    Metformin Hcl 500 Mg Tabs (Metformin hcl) .Marland Kitchen... Take 1 tablet by mouth once a day Pt advised to reduce carbohydrate intake, espescially sweets, and to start regular physical activity, at least 30 minutes 5 days weekly, to enable weight loss, and reduce the risk of becoming diabetic   Problem # 2:  ALLERGIC RHINITIS, SEASONAL (ICD-477.0) Assessment: Unchanged  Problem # 3:  HYPERTENSION (ICD-401.9) Assessment: Unchanged  His updated medication list for this problem includes:    Hydrochlorothiazide 25 Mg Tabs (Hydrochlorothiazide) ..... One tab by mouth once daily    Norvasc 10 Mg Tabs (Amlodipine besylate) ..... One tab by mouth once daily  Orders: T-Basic Metabolic Panel [phone]) T-Basic Metabolic Panel [phone])  BP today: 118/80 Prior BP: 116/82 (03/21/2010)  Labs Reviewed: K+: 4.1 (03/21/2010) Creat: : 1.16 (03/21/2010)   Chol: 132 (03/21/2010)   HDL: 52 (03/21/2010)   LDL: 64 (03/21/2010)   TG: 82 (03/21/2010)  Problem # 4:  HYPERLIPIDEMIA (ICD-272.4) Assessment: Comment Only  The following medications were removed from the  medication list:    Simvastatin 20 Mg Tabs (Simvastatin) ..... One tab by mouth at bedtime  Orders: T-Lipid Profile [phone]) T-Hepatic Function [phone]) T-Lipid Profile [id number]) Low fat diet discussed and encouraged, and literature also given  Labs Reviewed: SGOT: 14 (03/21/2010)   SGPT: 12 (03/21/2010)   HDL:52 (03/21/2010), 60 (10/17/2009)  LDL:64 (03/21/2010), 90 (10/17/2009)  Chol:132 (03/21/2010), 163 (10/17/2009)  Trig:82  (03/21/2010), 67 (10/17/2009)  Complete Medication List: 1)  Klor-con M20 20 Meq Tbcr (Potassium chloride crys cr) .... One tab by mouth once daily 2)  Hydrochlorothiazide 25 Mg Tabs (Hydrochlorothiazide) .... One tab by mouth once daily 3)  Norvasc 10 Mg Tabs (Amlodipine besylate) .... One tab by mouth once daily 4)  Synthroid 75 Mcg Tabs (Levothyroxine sodium) .... One tab by mouth once daily 5)  Zyrtec Allergy 10 Mg Tabs (Cetirizine hcl) .... One tab by mouth once daily 6)  Multivitamins Tabs (Multiple vitamin) .... One tab by mouth once daily 7)  Flomax 0.4 Mg Caps (Tamsulosin hcl) .... Take 1 tablet by mouth once a day 8)  Cialis 20 Mg Tabs (Tadalafil) .... One tablet every 3 days as needed, to be used 30 minutes before intercourse  Other Orders: T- Hemoglobin A1C ([id number]) T-TSH ([id number]) T- Hemoglobin A1C (38756-43329) T-TSH ([id number])  Patient Instructions: 1)  Please schedule a follow-up appointment in 3.5 months. 2)  BMP prior to visit, ICD-9: 3)  Hepatic Panel prior to visit, ICD-9: 4)  Lipid Panel prior to visit, ICD-9:  fasting today 5)  TSH prior to visit, ICD-9: 6)  HbgA1C prior to visit, ICD-9: 7)  BMP prior to visit, ICD-9: 8)  Lipid Panel prior to visit, ICD-9: 9)  TSH prior to visit, ICD-9:   fasting in 3.5 months 10)  HbgA1C prior to visit, ICD-9 11)  STOP simvastatin and metformin please as of today. 12)  I am removing the dx of diabetes from your record as of today. 13)  Patient advised to reduce carbs and sweets, commit to regular physical activity, take meds as prescribed, test blood sugars as directed, and attempt to lose weight , to improve blood sugar control. 14)  Low fat diet discussed and encouraged, and literature also given 15)  The medication list was reviewed and reconciled..All changed/newly prescribed medications were explained. A complete medication list was provided to the patient/caregiver.

## 2010-11-15 NOTE — Assessment & Plan Note (Signed)
Summary: OFFIE VISIT   Vital Signs:  Patient profile:   70 year old male Height:      65 inches Weight:      157.50 pounds BMI:     26.30 O2 Sat:      96 % Pulse rate:   82 / minute Pulse rhythm:   regular Resp:     16 per minute BP sitting:   108 / 82  (left arm) Cuff size:   regular  Vitals Entered By: Everitt Amber LPN (October 29, 2010 9:43 AM)  Nutrition Counseling: Patient's BMI is greater than 25 and therefore counseled on weight management options. CC: Follow up chronic problems, wants to know if he can cut back on fluid pill because he has to go to the bathroom all the time    Primary Care Provider:  Kerri Perches MD  CC:  Follow up chronic problems and wants to know if he can cut back on fluid pill because he has to go to the bathroom all the time .  History of Present Illness: Reports  that he is doing well. Denies recent fever or chills. Denies sinus pressure, nasal congestion , ear pain or sore throat. Denies chest congestion, or cough productive of sputum. Denies chest pain, palpitations, PND, orthopnea or leg swelling.He reports urinary frequency, requestingf a change in his BP med if possible. Has significant probs in social situations in particular. Denies abdominal pain, nausea, vomitting, diarrhea or constipation. Denies change in bowel movements or bloody stool. Denies dysuria , frequency, incontinence or hesitancy. Denies  joint pain, swelling, or reduced mobility. Denies headaches, vertigo, seizures. Denies depression, anxiety or insomnia. Denies  rash, lesions, or itch.     Current Medications (verified): 1)  Hydrochlorothiazide 25 Mg  Tabs (Hydrochlorothiazide) .... One Tab By Mouth Once Daily 2)  Norvasc 10 Mg  Tabs (Amlodipine Besylate) .... One Tab By Mouth Once Daily 3)  Synthroid 75 Mcg  Tabs (Levothyroxine Sodium) .... One Tab By Mouth Once Daily 4)  Zyrtec Allergy 10 Mg  Tabs (Cetirizine Hcl) .... One Tab By Mouth Once Daily 5)   Multivitamins  Tabs (Multiple Vitamin) .... One Tab By Mouth Once Daily 6)  Flomax 0.4 Mg Caps (Tamsulosin Hcl) .... Take 1 Tablet By Mouth Once A Day 7)  Klor-Con M20 20 Meq Cr-Tabs (Potassium Chloride Crys Cr) .... Take 1 Tablet By Mouth Once A Day  Allergies (verified): 1)  ! Pcn 2)  ! * Eggs 3)  ! * Peanuts  Review of Systems      See HPI General:  Complains of fatigue. Eyes:  Denies blurring, discharge, eye pain, and red eye. GU:  Complains of erectile dysfunction and urinary frequency. MS:  Complains of joint pain and stiffness; mild. Endo:  Denies cold intolerance, excessive hunger, excessive thirst, excessive urination, and heat intolerance; on thyroid replacement. Heme:  Denies abnormal bruising and bleeding. Allergy:  Complains of seasonal allergies; denies hives or rash and itching eyes.  Physical Exam  General:  Well-developed,well-nourished,in no acute distress; alert,appropriate and cooperative throughout examination HEENT: No facial asymmetry,  EOMI, No sinus tenderness, cerumen i[paction, oropharynx  pink and moist.   Chest: Clear to auscultation bilaterally.  CVS: S1, S2, No murmurs, No S3.   Abd: Soft, Nontender.  MS: Adequate ROM spine, hips, shoulders and knees.  Ext: No edema.   CNS: CN 2-12 intact, power tone and sensation normal throughout.   Skin: Intact, no visible lesions or rashes.  Psych: Good eye  contact, normal affect.  Memory intact, not anxious or depressed appearing.     Impression & Recommendations:  Problem # 1:  OTHER URINARY INCONTINENCE (ICD-788.39) Assessment Comment Only  Orders: Medicare Electronic Prescription [phone])  Problem # 2:  ERECTILE DYSFUNCTION (ICD-302.72) Assessment: Unchanged  The following medications were removed from the medication list:    Cialis 20 Mg Tabs (Tadalafil) ..... One tablet every 3 days as needed, to be used 30 minutes before intercourse  Problem # 3:  HYPERTENSION (ICD-401.9) Assessment:  Unchanged  The following medications were removed from the medication list:    Hydrochlorothiazide 25 Mg Tabs (Hydrochlorothiazide) ..... One tab by mouth once daily His updated medication list for this problem includes:    Norvasc 10 Mg Tabs (Amlodipine besylate) ..... One tab by mouth once daily    Hydrochlorothiazide 12.5 Mg Tabs (Hydrochlorothiazide) .Marland Kitchen... Take 1 tablet by mouth once a day dose reduction effective 10/29/2010  Orders: Medicare Electronic Prescription [phone]) T-Basic Metabolic Panel [phone])  BP today: 108/82 Prior BP: 118/80 (07/25/2010)  Labs Reviewed: K+: 4.0 (07/25/2010) Creat: : 1.13 (07/25/2010)   Chol: 137 (07/25/2010)   HDL: 60 (07/25/2010)   LDL: 64 (07/25/2010)   TG: 66 (07/25/2010)  Problem # 4:  UNSPECIFIED HYPOTHYROIDISM (ICD-244.9) Assessment: Comment Only  His updated medication list for this problem includes:    Synthroid 75 Mcg Tabs (Levothyroxine sodium) ..... One tab by mouth once daily  Orders: T-TSH ([id number]) T-TSH [phone])  Labs Reviewed: TSH: 9.346 (07/25/2010)   rept lab due HgBA1c: 6.2 (07/25/2010) Chol: 137 (07/25/2010)   HDL: 60 (07/25/2010)   LDL: 64 (07/25/2010)   TG: 66 (07/25/2010)  Problem # 5:  HYPERLIPIDEMIA (ICD-272.4)  Complete Medication List: 1)  Norvasc 10 Mg Tabs (Amlodipine besylate) .... One tab by mouth once daily 2)  Synthroid 75 Mcg Tabs (Levothyroxine sodium) .... One tab by mouth once daily 3)  Zyrtec Allergy 10 Mg Tabs (Cetirizine hcl) .... One tab by mouth once daily 4)  Multivitamins Tabs (Multiple vitamin) .... One tab by mouth once daily 5)  Flomax 0.4 Mg Caps (Tamsulosin hcl) .... Take 1 tablet by mouth once a day 6)  Klor-con M20 20 Meq Cr-tabs (Potassium chloride crys cr) .... Take 1 tablet by mouth once a day 7)  Oxybutynin Chloride 5 Mg Tabs (Oxybutynin chloride) .... Take 1 tablet by mouth once a day as needed for urinary incontinence 8)  Viagra  .... Take 1 tablet by mouth once a  day as needed for sexual activity, use 30 mins before intercourse 9)  Hydrochlorothiazide 12.5 Mg Tabs (Hydrochlorothiazide) .... Take 1 tablet by mouth once a day dose reduction effective 10/29/2010  Other Orders: T- Hemoglobin A1C ([id number])  Patient Instructions: 1)  Please schedule a follow-up appointment in 4. months. 2)  NURSE BP CHECK in 6 weeks 3)  A new med is being started for excessive wetting, use one daily only on the days you anticipate the need, eg when you are going to sing.If this does not work pls contact the specialist 4)  The dose of HCTZ is half the current, pls break your tabs in half, and the new med is 12.5mg  one daily 5)  TSH today. 6)  TSH prior to visit, ICD-9: 7)  HbgA1C prior to visit, ICD-9: in 4 months 8)  BMP prior to visit, ICD-9: Prescriptions: FLOMAX 0.4 MG CAPS (TAMSULOSIN HCL) Take 1 tablet by mouth once a day  #30 x 3   Entered by:   Adella Hare LPN  Authorized by:   Syliva Overman MD   Signed by:   Adella Hare LPN on 04/54/0981   Method used:   Electronically to        [facility] [phone]* (retail)       [address] [place], [place]  [address]       Ph: [phone]       Fax: [phone]   RxID:   [id number] HYDROCHLOROTHIAZIDE 12.5 MG TABS (HYDROCHLOROTHIAZIDE) Take 1 tablet by mouth once a day dose reduction effective 10/29/2010  #30 x 3   Entered and Authorized by:   Syliva Overman MD   Signed by:   Syliva Overman MD on 10/29/2010   Method used:   Printed then faxed to ...       [facility] [address] [phone]* (retail)       [address] [place], [place]  [address]       Ph: [phone]       Fax: [phone]   RxID:   [id number] OXYBUTYNIN CHLORIDE 5 MG TABS (OXYBUTYNIN CHLORIDE) Take 1 tablet by mouth once a day as needed for urinary incontinence  #30 x 2   Entered and Authorized by:   Syliva Overman MD   Signed by:   Syliva Overman MD on 10/29/2010   Method used:   Electronically to         [facility] [address] [phone]* (retail)       [address] [place], [place]  [address]       Ph: [phone]       Fax: [phone]   RxID:   [id number]    Orders Added: 1)  Est. Patient Level IV [38182] 2)  Medicare Electronic Prescription [G8553] 3)  T-TSH [99371-69678] 4)  T-Basic Metabolic Panel [phone] 5)  T- Hemoglobin A1C [83036-23375] 6)  T-TSH [25852-77824]    viagra samples given x2 [id number] exp 7/15 Adella Hare LPN  October 29, 2010 10:44 AM

## 2010-11-19 ENCOUNTER — Telehealth: Payer: Self-pay | Admitting: Family Medicine

## 2010-11-29 NOTE — Progress Notes (Signed)
Summary: medicine  Phone Note Call from Patient   Summary of Call: patient would like to have his medicine called into [facility] he needs his hydrochlorothia zide 12.5 mg, norvasac 10 mg, and klor-cam m20 meq. Initial call taken by: Lind Guest,  November 19, 2010 9:44 AM  Follow-up for Phone Call        Will fax after doctor signs and fax to [facility] Follow-up by: Everitt Amber LPN,  November 19, 2010 9:47 AM    Prescriptions: HYDROCHLOROTHIAZIDE 12.5 MG TABS (HYDROCHLOROTHIAZIDE) Take 1 tablet by mouth once a day dose reduction effective 10/29/2010  #90 x 0   Entered by:   Everitt Amber LPN   Authorized by:   Syliva Overman MD   Signed by:   Everitt Amber LPN on 09/81/1914   Method used:   Printed then faxed to ...       [facility] [address] [phone]* (retail)       [address] [place], [place]  [address]       Ph: [phone]       Fax: [phone]   RxID:   [id number] KLOR-CON M20 20 MEQ CR-TABS (POTASSIUM CHLORIDE CRYS CR) Take 1 tablet by mouth once a day  #90 x 0   Entered by:   Everitt Amber LPN   Authorized by:   Syliva Overman MD   Signed by:   Everitt Amber LPN on 42/59/5638   Method used:   Printed then faxed to ...       [facility] [address] [phone]* (retail)       [address] [place], [place]  [address]       Ph: [phone]       Fax: [phone]   RxID:   [id number] NORVASC 10 MG  TABS (AMLODIPINE BESYLATE) one tab by mouth once daily  #90 x 0   Entered by:   Everitt Amber LPN   Authorized by:   Syliva Overman MD   Signed by:   Everitt Amber LPN on 62/83/1517   Method used:   Printed then faxed to ...       [facility] [address] [phone]* (retail)       [address], [place]  [address]       Ph: [phone]       Fax: [phone]   RxID:   [id number]

## 2010-12-17 ENCOUNTER — Encounter: Payer: Self-pay | Admitting: Family Medicine

## 2010-12-25 NOTE — Assessment & Plan Note (Signed)
Summary: nurse visit/slj  Nurse Visit   Vital Signs:  Patient profile:   70 year old male Pulse rhythm:   regular BP sitting:   120 / 82  (left arm)  Allergies: 1)  ! Pcn 2)  ! * Eggs 3)  ! * Peanuts Of note the right upper ext has a systolic of 120/84, no change in med will be made at this vist, no med changes

## 2011-01-07 ENCOUNTER — Other Ambulatory Visit: Payer: Self-pay | Admitting: Family Medicine

## 2011-01-24 ENCOUNTER — Other Ambulatory Visit: Payer: Self-pay | Admitting: Family Medicine

## 2011-01-24 DIAGNOSIS — R32 Unspecified urinary incontinence: Secondary | ICD-10-CM

## 2011-02-12 ENCOUNTER — Telehealth: Payer: Self-pay | Admitting: Family Medicine

## 2011-02-12 NOTE — Telephone Encounter (Signed)
Patient aware.

## 2011-02-12 NOTE — Telephone Encounter (Signed)
i do not authorize pumps he needs to see a urologist for this, pls let him know, if he decides on this I will refer happily

## 2011-02-12 NOTE — Telephone Encounter (Signed)
Just wanted to make sure you did authorize this because the message wasn't clear

## 2011-02-13 NOTE — Telephone Encounter (Deleted)
error 

## 2011-02-13 NOTE — Telephone Encounter (Signed)
error 

## 2011-02-28 ENCOUNTER — Encounter: Payer: Self-pay | Admitting: Family Medicine

## 2011-03-01 NOTE — Assessment & Plan Note (Signed)
[facility]                        CARDIOLOGY OFFICE NOTE   [name], [name]                  MRN:          [id number]  DATE:12/04/2006                            DOB:          03-01-1941    IDENTIFICATION:  [name] is a 70 year old gentleman who is referred  by Dr. Lodema Hong for an abnormal EKG.   HISTORY OF PRESENT ILLNESS:  The patient is followed for high blood  pressure, hypertension, hypothyroidism. He was seen in [facility]  and had an EKG done earlier this month and this showed sinus rhythm 94  beats per minute. Q-wave only in AVF. Tiny R-waves seen in III, but  again difficult because of transmission by fax.   Talking to the patient he denies chest pain. No shortness of breath. 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 Denies any limitation with  this. No dizziness. No palpitations.   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 No records on  this.   ALLERGIES:  PENICILLIN.   MEDICATIONS:  1. Zyrtec 10.  2. Hydrochlorothiazide 25.  3. Caduet 10/10 daily.  4. Synthroid 0.5 daily.  5. Klor-con 20 mEq.  6. Aspirin 81 mg daily.   PAST MEDICAL HISTORY:  1. Hypertension.  2. Hypothyroidism.  3. Dyslipidemia.  4. Tumor removed five years ago.   SOCIAL HISTORY:  The patient is married. Does not smoke and does not  drink.   FAMILY HISTORY:  Father died at age 34 and was diabetic. Mother died of  a brain hemorrhage at age 45. The patient has five brothers, two are  deceased and one in the military and one because of complications of  diabetes. He has two sisters who are alive.   REVIEW OF SYSTEMS:  All systems reviewed. Negative to the above problem  except as noted above.   PHYSICAL EXAMINATION:  The patient is in no distress. Blood pressure is  130/88, pulse is 87. Weight is 169.  HEENT: Normocephalic, atraumatic.  EOMI. PERRL. Throat clear.  NECK: JVP is normal. No bruits. No thyromegaly.  LUNGS:  Clear without rales or wheezes.  CARDIAC: Regular rate and rhythm, S1, S2. No S3, no S4 and no murmurs.  PMI not displaced.  ABDOMEN: Benign. No hepatosplenomegaly. No masses. Supple.  EXTREMITIES: Good distal pulses throughout. No lower extremity edema.   A 12-lead EKG shows normal sinus rhythm 87 beats per minute. Occasional  PVCs, three during the EKG. Incomplete right bundle branch block.  Possible septal myocardial infarction. Left anterior fascicular block.  No Q-wave seen in III and F. Septal Q's noted.   IMPRESSION:  A 70 year old with no known history of coronary artery  disease. He has had a sternotomy before for a tumor removal.   He is active, denies any limitations. His EKG has some subtle  abnormalities and some PVCs today for which he does not sense. Note,  potassium on last lab check was normal.   Based on the above and with his history of hypertension, I would get  an  echocardiogram. If normal, I would not schedule any other followup. This  would be considered a normal EKG for him. Followup palpitations, but  again asymptomatic and with recent electrolytes normal, would not pursue  unless he becomes symptomatic.   Review of his labs-He has excellent lipid control again at 49 HDL and  LDL of 62.  Continue on Caduet.   I have not set a definite followup unless his echo is abnormal.     Paul Riffle, MD, [facility]  Electronically Signed    PVR/MedQ  DD: 12/04/2006  DT: 12/04/2006  Job #: [id number]   cc:   Milus Mallick. Lodema Hong, M.D.

## 2011-03-01 NOTE — H&P (Signed)
[facility]  Patient:    [name], [name] Visit Number: [id number] MRN: [id number]          Service Type: MED Location: 2A A213 01 Attending Physician:  Elliot Cousin Dictated by:   Elliot Cousin, M.D. Admit Date:  03/16/2002                           History and Physical  CHIEF COMPLAINT: 1. Lower extremity swelling and fatigue. 2. Increase in creatinine and BUN.  HISTORY OF PRESENT ILLNESS:  [name] is a 70 year old African-American man with a past medical history significant for status post median sternotomy, exploration of the mediastinum, and total thyromegaly on Mar 01, 2002, secondary to a large mediastinal goiter, who presented to the office on March 15, 2002, with increasing lower extremity swelling and progressive generalized weakness.  The patient was evaluated by Elpidio Anis, M.D., who ordered a chemistry panel.  The chemistry panel revealed that the patients creatinine was 19.2 and BUN was 82.  As a comparison, in March of 2003, the creatinine was 1.0 and the BUN was 16.  Upon questioning the patient, he had a two to three-day history of progressive swelling in his legs and generalized weakness and fatigue that he could not understand.  He admitted to urinating only approximately 50-100 cc of urine over the past 24-48 hours.  In fact, he states that his urine output has been significantly lower than usual over the past four to five days after being discharged from the hospital on Mar 05, 2002.  The patient denies rectal pain, painful urination, bloody urine, penial discharge, shortness of breath, orthopnea, headache, and chest pain.  He denies any associated abdominal pain, however, he did have some nausea and vomiting twice yesterday.  He denies any blood or coffee ground emesis.  He denies diarrhea or constipation.  He denies blood in his stools or black, tarry stools.  In association with the decreased urine output, the  patient does report urinary  hesitancy and difficulty with urination.  PAST MEDICAL HISTORY: 1. Status post median sternotomy, exploration of the mediastinum, and total    thyroidectomy secondary to the large mediastinal goiter on Mar 01, 2002, at    [facility]. 2. Hypothyroidism secondary to thyroidectomy. 3. Hypertension. 4. History of benign prostatic hypertrophy. 5. History of a back injury in 2001.  CURRENT MEDICATIONS: 1. Cardizem LA 240 mg q.d. 2. Lasix 40 mg q.d. (started on March 15, 2002). 3. Potassium chloride 20 mEq q.d. (started on March 15, 2002). 4. Flomax 0.4 mg q.d. (started on March 15, 2002). 5. Synthroid 150 mcg q.d. 6. Calcium in the form of Tums four to five tablets q.d.  ALLERGIES:  PENICILLIN which causes severe rash.  FAMILY HISTORY:  His mother died of a cerebral hemorrhage at the age of 86 years old.  His father died in his 2s secondary to complications of diabetes mellitus.  SOCIAL HISTORY:  The patient is married and lives with his wife and one of his sons in [place].  He has a total of five children in all.  He is currently unemployed.  He denies alcohol, tobacco, and street drug use.  He completed high school and can read and write.  REVIEW OF SYSTEMS:  As above in the history of present illness.  PHYSICAL EXAMINATION:  Vitals in the office showed pulse 104, respiratory rate 18, and blood pressure 170/105.  Vitals in the  hospital showed temperature 97.3 degrees, pulse 74, respiratory rate 18, and blood pressure 156/82.  WEIGHT:  166 pounds.  GENERAL APPEARANCE:  The patient is an alert, well-nourished, 70 year old, African-American man who is currently in no acute distress.  HEENT:  The head is normocephalic and atraumatic.  The pupils are equal, round, and reactive to light.  Extraocular movements are intact.  The conjunctivae are clear.  The sclerae are white.  The tympanic membrane on the left is clear.  The  tympanic membrane on the right is obscured by cerumen. The nasal mucosa is moist with no drainage.  The oropharynx reveals mucous membranes, no exudates, and no erythema.  NECK:  Supple.  No adenopathy.  No thyromegaly.  He does have a well-healed horizontal surgical scar present.  No drainage.  No erythema.  CHEST:  The chest wall revealed a long vertical, well-healed mid chest sternotomy scar which is mildly tender over the scar and mild tender diffusely over the right and left chest wall.  HEART:  S1 and S2 with no murmurs, rubs, or gallops.  LUNGS:  Clear to auscultation bilaterally.  ABDOMEN:  The patient has a very distended bladder which is tender to palpation mildly to moderately.  Bowel sounds are present.  His abdomen is otherwise soft and no masses or hepatosplenomegaly palpated.  RECTAL AND GENITOURINARY:  Exam deferred until hospitalization, however, the patient had a GU and prostatic exam per Alleen Borne, M.D., urologist. Therefore, the rectal/prostate exam will not be reassessed.  EXTREMITIES:  The patient has approximately 3-4+ pedal and pretibial edema. Pedal pulses were unable to be assessed secondary to the swelling.  Otherwise he has no other joint abnormalities.  NEUROLOGIC:  The patient is alert and oriented x 3.  Cranial nerves II-XII intact.  Strength is 5/5 throughout.  Sensation is intact throughout. Psychological:  The patient does have a somewhat flat and saddened affect.  ADMISSION LABORATORY DATA:  Sodium 139, potassium 5.6, chloride 101, CO2 24, glucose 103, BUN 90, creatinine 23.7, calcium 9.0, total bilirubin 0.7, direct bilirubin less than 0.1, alkaline phosphatase 56, SGOT 21, SGPT 33, total protein 6.7, albumin 3.3.  WBC 9.1, hemoglobin 10.8, hematocrit 32.3, platelets 363.  Urinalysis (after Foley catheter):  Urine color red, urine appearance cloudy, urine specific gravity 1.020, urine ketones 15, urine blood large, urine protein greater  than 300, urine nitrites positive, urine leukocytes large, urine bilirubin positive, urine wbcs too numerous to count,  urine rbcs too numerous to count, urine bacteria many.  The admission chest x-ray revealed vascular congestion, small bilateral pleural effusions, and COPD.  ASSESSMENT: 1. Acute renal failure probably secondary to post renal failure, which is    probably secondary to urinary retention.  The patient has no prior history    of chronic renal insufficiency given that his creatinine was 1.0 and his    BUN was 16 on December 24, 2001.  When it was repeated on March 15, 2002, in the    office, his BUN had gone up to 82 and his creatinine had increased to 19.2.    After the exam revealed that the patient had a very distended bladder, a    Foley catheter was inserted after the patient was admitted.  The patient    promptly diuresed over 2 L of urine into the Foley bag. 2. Hypertension. 3. Bilateral lower extremity edema probably secondary to acute renal failure. 4. Pyuria/hematuria/bacteriuria probably secondary to urinary retention and    urinary tract infection. 5. Anemia  with a hemoglobin of 10.8 and a hematocrit of 32.3.  Compare this a    hemoglobin of 14.9 and a hematocrit of 45.0 in March of 2003.  The decrease    in hemoglobin and hematocrit is probably secondary to acute renal    insufficiency. 6. Status post total thyroidectomy on Mar 01, 2002, at [facility]. 7. Hypothyroidism total thyroidectomy.  A thyroid stimulating hormone in the    office on March 15, 2002, revealed a value of 2.99 (within normal limits).  PLAN:  1. The patient will be admitted for further evaluation and management of     acute renal failure and post renal syndrome secondary to urinary     retention.  2. The patient was initially treated with Lasix 40 mg IV q.12h., however, the     Lasix will be discontinued after the prompt diuresis that the patient     experienced after  the Foley catheter was inserted.  3. Obtain a renal ultrasound to evaluate the patients kidneys, specifically     to assess for hydronephrosis or renal masses/abnormalities.  4. Check a PSA.  5. Urine culture and sensitivity.  6. Empiric treatment with Levaquin 500 mg IV q.24h.  7. Urology consult, which has already been provided by Alleen Borne, M.D.  8. Continue chronic medications for hypertension and hypothyroidism.  9. Strict I&Os and daily weights.  Watch for the diuretic phase of ATN. 10. Follow electrolytes and renal function.  If the patients urine output is     excessive and/or if there is no resolution of the acute azotemia, will     obtain a nephrology consult. 11. Volume repletion with normal saline at 125 cc/hr. 12. Obtain stool cultures. 13. Iron supplementation with ferrous sulfate q.d. or hemocyte one q.d.  ADDENDUM:  Renal ultrasound results revealed mild right hydronephrosis, mildly echogenic right kidney, borderline echogenic left kidney consistent with medical renal disease, and three simple left renal cysts. Dictated by:   Elliot Cousin, M.D. Attending Physician:  Elliot Cousin DD:  03/16/02 TD:  03/16/02 Job: [id number]

## 2011-03-01 NOTE — H&P (Signed)
NAMEMANDEL, SEIDEN           ACCOUNT NO.:  [id number]   MEDICAL RECORD NO.:  [id number]          PATIENT TYPE:  AMB   LOCATION:  DAY                           FACILITY:  [facility]   PHYSICIAN:  Jerolyn Shin C. Katrinka Blazing, M.D.   DATE OF BIRTH:  04-01-1941   DATE OF ADMISSION:  DATE OF DISCHARGE:  LH                                HISTORY & PHYSICAL   A 70 year old male referred for screening colonoscopy.  He has not had  rectal bleeding.  He has not had difficulty with bowel movements.  There is  no prior history of colonoscopy.   PAST HISTORY:  1.  Hypertension.  2.  BPH.  3.  Hypothyroidism.   SURGERY:  1.  Total thyroidectomy.  2.  TURP.   MEDICATIONS:  1.  Norvasc 10 mg daily.  2.  Hydrochlorothiazide 25 mg daily.  3.  Potassium chloride 20 mEq daily.  4.  Synthroid 75 mcg daily.   PHYSICAL EXAMINATION:  VITAL SIGNS:  Blood pressure 160/94, pulse 86,  respirations 20, weight 162 pounds.  HEENT:  Unremarkable.  NECK:  Supple.  No JVD, bruit, adenopathy, or thyromegaly.  CHEST:  Clear to auscultation.  HEART:  Regular rate and rhythm without murmur, gallop, or rub.  ABDOMEN:  Soft, nontender.  No masses.  RECTAL:  No masses.  Stool guaiac negative.  EXTREMITIES:  No cyanosis, clubbing, or edema.  NEUROLOGIC:  No focal motor, sensory, or cerebellar deficit.   IMPRESSION:  1.  Need for screening colonoscopy.  2.  Hypertension.  3.  Hypothyroidism.  4.  Benign prostatic hypertrophy.   PLAN:  Screening colonoscopy.      Dirk Dress. Katrinka Blazing, M.D.  Electronically Signed     LCS/MEDQ  D:  05/15/2005  T:  05/15/2005  Job:  [id number]

## 2011-03-01 NOTE — Procedures (Signed)
NAMEMIGEL, HANNIS           ACCOUNT NO.:  [id number]   MEDICAL RECORD NO.:  [id number]          PATIENT TYPE:  OUT   LOCATION:  RAD                           FACILITY:  [facility]   PHYSICIAN:  Pricilla Riffle, MD, FACCDATE OF BIRTH:  06/08/1941   DATE OF PROCEDURE:  12/08/2006  DATE OF DISCHARGE:                                ECHOCARDIOGRAM   REFERRING PHYSICIAN:  Dr. Lodema Hong.   TEST INDICATION:  A 70 year old with a history of hypertension and  abnormal EKG.   A 2D echo with echo Doppler:  Left ventricle is normal in size with an  end diastolic dimension of 44-mm, and the ventricular septum is  minimally thickened at 12-mm, posterior wall normal at 11-mm.   The left atrium is normal at 28-mm.  Right atrium and right ventricle  are normal.   The aortic valve is mildly thickened.  No stenosis.  Trace  insufficiency.  Tricuspid valve is mildly thickened with trace  insufficiency.   Pulmonic valve is normal with mild insufficiency.  Tricuspid valve is  normal with mild insufficiency.  Estimated PA pressure is 28-mmHg.   Overall LV systolic function appears mildly depressed at approximately  45%.  RVF appears grossly normal.  Note, there is mild LV diastolic  dysfunction.   No pericardial effusion is seen.      Pricilla Riffle, MD, [facility]  Electronically Signed     PVR/MEDQ  D:  12/08/2006  T:  12/08/2006  Job:  [id number]

## 2011-03-01 NOTE — Op Note (Signed)
[facility]  Patient:    BUFORD, BREMER Visit Number: [id number] MRN: [id number]          Service Type: MED Location: 2A A213 01 Attending Physician:  Elliot Cousin Dictated by:   Alleen Borne, M.D. Proc. Date: 03/23/02 Admit Date:  03/16/2002   CC:         Elliot Cousin, M.D.   Operative Report  PREOPERATIVE DIAGNOSES: 1. Acute urinary retention. 2. Benign prostatic hypertrophy.  POSTOPERATIVE DIAGNOSES: 1. Acute urinary retention. 2. Benign prostatic hypertrophy.  OPERATION:  Transurethral resection of prostate.  SURGEON:  Alleen Borne, M.D.  ANESTHESIA:  Spinal.  DESCRIPTION OF PROCEDURE:  The patient was given spinal anesthesia, placed in lithotomy position.  After usual prep and drape, a #28 Iglesias resectoscope was introduced into the bladder.  It is inspected.  He has trilobar hypertrophy.  Resectoscope was pulled back in mid prostate.  Urethral median lobe was resected.  Then the bladder neck was circumferentially resected down to the circular fibers.  The resectoscope was pulled back at the level of the verumontanum reached to 11 oclock position.  A groove is created at the 11 oclock position between the bladder neck and the level of the verumontanum. Then the right lobe is resected between 11 and 7 oclock position.  Similarly, the left lobe is resected between the 1 and 5 oclock position.  There is a small amount of tissue in the anterior midline which is resected next.  At this point, the posterior midline and the apical tissue is resected very carefully, not injuring the sphincter of the verumontanum. Prior surgery ______  wide open.  ______ .  Bleeders were coagulated.  The resectoscope was then removed, and #22 Foley catheter left in for drainage.  The patient left the operating room in satisfactory condition.  palpated, rolled, and brought under the skin.  Skin incision about 1/2 inch long is made.  With blunt  and sharp dissection, the vas is exposed.  It is grabbed with an Allis clamp.  A 1 inch segment of the vas was clamped between Hemostats and divided.  The divided ends of the vas were fulgurated and then ligated with 3-0 Vicryl tie.  The proximal end was buried in fascial layers with 4-0 chromic stitch.  The skin was closed with 4-0 chromic.  The same procedure was repeated on the left side.  I sent his right vas for frozen section to make sure it was the proper structure.  The report came back that it is vas deferens.  The second vas from the left side grossly looks fine, so I did not have to do a frozen section because I was sure that is the vas deferens.  Sterile gauze dressing was applied.  The patient left the operating room in satisfactory condition. Dictated by:   Alleen Borne, M.D. Attending Physician:  Elliot Cousin DD:  03/23/02 TD:  03/25/02 Job: [id number]

## 2011-03-01 NOTE — Consult Note (Signed)
[facility]  Patient:    [name], [name] Visit Number: [id number] MRN: [id number]          Service Type: MED Location: 2A A213 01 Attending Physician:  Elliot Cousin Dictated by:   Alleen Borne, M.D. Proc. Date: 03/16/02 Admit Date:  03/16/2002                            Consultation Report  CHIEF COMPLAINT:  Difficulty with urination.  HISTORY OF PRESENT ILLNESS:  This 70 year old says that two weeks ago he had benign tumor removed from his mediastinum in [facility].  Ever since then, he has felt that he is not making enough urine, and he was going downhill.  Before that, he said he was having difficulty voiding with hesitancy, nocturia x 2 to 3, slow and weak stream, inability to completely empty the bladder.  Then, after his surgery, it appears that he is not really making enough urine.  He was admitted by Dr. Sherrie Mustache, and a Foley catheter was inserted.  The patient is diuresing, outputting a lot of urine.  He was found to have BUN 90 and creatinine 23.7.  The patient is in acute renal failure, but he is having good diuresis right now.  PAST MEDICAL HISTORY:  He is known to have hypertension but no diabetes.  The only surgery was recent sternotomy for benign mediastinal tumor.  At that time when he had the tumor, he said it was difficult for him to breath, especially when lying down, but not any more.  FAMILY HISTORY:  No history of prostate cancer.  SOCIAL HISTORY:  Unremarkable.  REVIEW OF SYSTEMS:  Unremarkable.  PHYSICAL EXAMINATION:  PHYSICAL EXAMINATION:  GENERAL:  Well-nourished, well-developed male in no acute distress.  ABDOMEN:  Soft and flat.  Liver, kidney, spleen not palpable.  No CVA tenderness.  GU:  External genitalia circumcised with Foley catheter in place.  Testicles are atrophic.  RECTAL:  Moderate sphincter tone.  No rectal mass.  Prostate is 1.5+, smooth, and firm.  LABORATORY DATA:  WBC count  9.1, hematocrit 32.3.  Sodium 139, potassium 5.6, chloride 101, CO2 24, glucose 103, BUN 90, creatinine 23.7, calcium 9.  Renal ultrasound shows mild right hydronephrosis, mildly echogenic right kidney, and borderline echogenic left kidney.  Simple left renal cyst.  IMPRESSION: 1. Possible benign prostatic hypertrophy with bladder neck obstruction. 2. Acute renal failure, could be secondary to #1 or maybe dehydration. 3. Hypertension.  RECOMMENDATIONS:  Continue IV fluids, Foley catheter drainage, monitor electrolytes BUN, creatinine, and also get PSA.  Discontinue potassium chloride.  Once BUN and creatinine are stabilized, he will need cystoscopy.  I thank Dr. Sherrie Mustache for allowing me to see her patient. Dictated by:   Alleen Borne, M.D. Attending Physician:  Elliot Cousin DD:  03/16/02 TD:  03/18/02 Job: [id number]

## 2011-03-01 NOTE — Discharge Summary (Signed)
[facility]  Patient:    [name], [name] Visit Number: [id number] MRN: [id number]          Service Type: MED Location: 2A A213 01 Attending Physician:  Elliot Cousin Dictated by:   Elliot Cousin, M.D. Admit Date:  03/16/2002 Discharge Date: 03/25/2002   CC:         Alleen Borne, M.D.   Discharge Summary  DISCHARGE DIAGNOSES: 1. Acute renal failure secondary to obstructive uropathy. 2. Benign prostatic hypertrophy status post transurethral resection of the prostate on March 23, 2002. 3. Hematuria secondary to acute renal failure/obstructive uropathy. 4. Normocytic anemia secondary to hematuria. 5. Lower extremity edema secondary to acute renal failure. Resolved during hospital course. 6. Hypertension.  SECONDARY DISCHARGE DIAGNOSES: 1. Status post mediastinotomy, exploration of the mediastinum, and total thyroidectomy secondary to a large mediastinal goiter on Mar 01, 2002 at [facility]. 2. Hypothyroidism secondary to thyroidectomy. 3. History of back injury in 2001.  DISCHARGE MEDICATIONS: 1. Synthroid 150 mcg one q.d. 2. Cardizem LA 180 mg q.d. 3. Calcium with Vitamin D one q.d. 4. Levaquin 500 mg q.d. 5. Vicodin 5 mg every 6 hours as needed for pain. 6. Discontinue Lasix and potassium.  HOSPITAL DISPOSITION:  The patient was discharged to home on March 25, 2002 in improved and stable condition.  FOLLOWUP:  He has a followup appointment with his urologist, Dr. Jerre Simon, in two weeks. He will need to follow up with his primary care physician, Dr. Lodema Hong, in approximately one week.  CONSULTATIONS:  Alleen Borne, M.D.  PROCEDURES PERFORMED: 1. Transurethral resection of the prostate on March 23, 2002. 2. Renal ultrasound. The results revealed mild right hydronephrosis and proximal hydroureter, increased echogenicity of the right kidney and borderline echogenicity of the left kidney, and three simple cysts  on the left kidney. 3. CT scan of the abdomen and pelvis with contrast. The results revealed tiny   bilateral pleural effusions with bibasilar scarring and atelectasis, no abdominal or retroperitoneal lymphadenopathy seen, the kidney on the left revealed a 2 cm cystic structure, adrenal glands normal, pancreas and gallbladder appear normal, small bowel unremarkable. CT scan of the pelvis revealed markedly thickened bladder wall, nonobstructing renal calculi, left-sided cystic renal lesions, normal appearance of the appendix. 4.  Cystoscopy, performed by Dr. Jerre Simon.  The results revealed a markedly trabeculated bladder secondary to catheter bladder reaction.  No gross evidence of tumor, stone, or foreign body.  The prostate urethra is completely obstructed with trilobar hypertrophy.  HISTORY OF PRESENT ILLNESS:  The patient is a 70 year old African-American man who presented to the office on March 15, 2002 with a chief complaint of progressive lower extremity swelling and progressive generalized weakness. Dr. Elpidio Anis ordered a chemistry panel for evaluation. The chemistry panel revealed that the creatinine was 19.2 and the BUN was 82. In comparison in March 2003, the creatinine was 1.0 and the BUN was 16. The patient was therefore admitted for evaluation and management of acute renal failure.  For additional details, please see the admitting History and Physical.  HOSPITAL COURSE: #1 - ACUTE RENAL FAILURE SECONDARY TO OBSTRUCTIVE UROPATHY:  On initial exam in the office, the patient had a very distended and tender bladder. Upon questioning the patient, he had a 2-3 day history of scant to very little urine output. He thought this was unusual; however, he did not believe it was very significant. However, as stated above in the History of Present Illness, the patients BUN was 82 and creatinine  was 19.2 per chemistry panel ordered March 15, 2002.  The initial management included  inserting a Foley catheter for decompensation of the bladder, Lasix 40 mg IV q.12 h., obtaining a renal ultrasound to evaluate the patients kidneys, obtaining a PSA, obtaining a urine culture and sensitivity, empiric treatment with Levaquin 500 mg IV q.24 h., and a urology consult with Dr. Jerre Simon. After the Foley catheter was inserted by nursing, the patient probably diuresed over 2-1/2 liters within 30 minutes. Approximately five minutes into the Foley insertion, the patient began having hematuria. This was thought to be secondary to the expansion and the decompensation of bladder veins.  On admission to the hospital, the patient had a BUN and creatinine repeated. The creatinine had slightly increased to 23.7 and the BUN had increased to 90 prior to the Foley catheter insertion. However, over the initial 48-72 hours, the BUN and creatinine fell dramatically as the patient began diuresing 3-4 liters of urine daily. On hospital day #2, the BUN fell to 21 and the creatinine fell to 3.0. On hospital day #8, the last recorded BUN was 6 and creatinine was 1.0. As stated previously, the patients diuresis was brisk and quite copious in the initial 2-3 days of hospital course. The patients IV fluids were adjusted accordingly to try to match the output. The patients ins and outs were matched with a good success during the hospital course. The IV fluids were eventually tapered off. By hospital day #3, the lower extremity edema had totally resolved.  Per Dr. Jerre Simon, the patient had benign prostatic hypertrophy on exam. He subsequently performed a transurethral resection of the prostate after the trail of discontinuing the Foley catheter had failed. Dr. Jerre Simon managed the postsurgery course. However, the patient continued to have complaints of suprapubic pain. Therefore, a CT scan of the abdomen and pelvis was ordered for further evaluation. The CT scan of the pelvis was remarkable for an  inflamed  thickened bladder, no other abnormalities with the exception of tiny bilateral renal calculi which were nonobstructing. The patient was therefore continued on treatment with Levaquin 500 mg IV q.24 h. and Vicodin as needed for pain.  After the transurethral resection of the prostate on March 23, 2002, there was trial of discontinuing the Foley catheter on the final hospital day. The Foley catheter was discontinued approximately 48 hours after the surgery. The patient was able to void without difficulty without the catheter. The patient was therefore discharged home on March 25, 2002 with close followup with Dr. Jerre Simon in approximately two weeks.  The patients total PSA was within normal limits at 2.56. The free PSA was 0.2 and the PSA percentage was 8% which was within normal limits. The urine culture was negative for growth during the hospital course.  #2 - NORMOCYTIC ANEMIA THOUGHT TO BE SECONDARY TO HEMATURIA AND ACUTE RENAL FAILURE:  The patients hemoglobin ranged from 10.8 to 12.4 and the hematocrit ranged from 32.3 to 37.7 during the hospital course. The MCV was within normal limits. The patients hemoglobin stabilized at 11.4 prior to hospital discharge. Given that the patient had no prior history of anemia, the anemia during the hospital course was felt to be secondary to the sequelae of acute renal failure and the gross hematuria that the patient experienced after the Foley catheter was inserted. Prior to discharge, the patients urine was clear. He was treated with an iron supplement during hospital course.  #3 - HYPERTENSION:  The patient was started on Cardizem LA at 240  mg q.d. prior to hospital admission secondary to recently uncontrolled hypertension. After the patient was hospitalized, his initial blood pressures systolically ranged in the 140s to the 150s. However, after the acute renal failure had resolved, the patients blood pressure systolically ranged from 110 to 125.  As a consequence, the Cardizem LA dose was reduced to 180 mg q.d. It is possible that the patients acute renal failure and volume overload initially contributed to the patients hypertension. He was quite normotensive prior to hospital discharge. Dictated by:   Elliot Cousin, M.D. Attending Physician:  Elliot Cousin DD:  03/30/02 TD:  03/31/02 Job: [id number]

## 2011-03-01 NOTE — Discharge Summary (Signed)
[facility]  Patient:    [name], STAUFFER Visit Number: [id number] MRN: [id number]          Service Type: MED Location: 2A A213 01 Attending Physician:  Elliot Cousin Dictated by:   Elliot Cousin, M.D. Admit Date:  03/16/2002 Discharge Date: 03/25/2002                             Discharge Summary  CONTINUATION TO #2841:  ADDENDUM:  Please add this addendum under procedures performed.  Add:  4. Cystoscopy, performed by Dr. Jerre Simon.  The results revealed a markedly    trabeculated bladder secondary to catheter bladder reaction.  No gross    evidence of tumor, stone, or foreign body.  The prostate urethra is    completely obstructed with trilobar hypertrophy. Dictated by:   Elliot Cousin, M.D. Attending Physician:  Elliot Cousin DD:  03/30/02 TD:  04/01/02 Job: [id number]

## 2011-03-06 ENCOUNTER — Ambulatory Visit (INDEPENDENT_AMBULATORY_CARE_PROVIDER_SITE_OTHER): Payer: Medicare HMO | Admitting: Family Medicine

## 2011-03-06 ENCOUNTER — Encounter: Payer: Self-pay | Admitting: Family Medicine

## 2011-03-06 DIAGNOSIS — R7309 Other abnormal glucose: Secondary | ICD-10-CM

## 2011-03-06 DIAGNOSIS — E039 Hypothyroidism, unspecified: Secondary | ICD-10-CM

## 2011-03-06 DIAGNOSIS — J301 Allergic rhinitis due to pollen: Secondary | ICD-10-CM

## 2011-03-06 DIAGNOSIS — R7301 Impaired fasting glucose: Secondary | ICD-10-CM

## 2011-03-06 DIAGNOSIS — E119 Type 2 diabetes mellitus without complications: Secondary | ICD-10-CM

## 2011-03-06 DIAGNOSIS — H6120 Impacted cerumen, unspecified ear: Secondary | ICD-10-CM

## 2011-03-06 DIAGNOSIS — R5381 Other malaise: Secondary | ICD-10-CM

## 2011-03-06 DIAGNOSIS — I10 Essential (primary) hypertension: Secondary | ICD-10-CM

## 2011-03-06 DIAGNOSIS — R5383 Other fatigue: Secondary | ICD-10-CM

## 2011-03-06 DIAGNOSIS — E785 Hyperlipidemia, unspecified: Secondary | ICD-10-CM

## 2011-03-06 LAB — BASIC METABOLIC PANEL
BUN: 14 mg/dL (ref 6–23)
Chloride: 104 mEq/L (ref 96–112)
Glucose, Bld: 100 mg/dL — ABNORMAL HIGH (ref 70–99)
Potassium: 4.6 mEq/L (ref 3.5–5.3)
Sodium: 141 mEq/L (ref 135–145)

## 2011-03-06 MED ORDER — HYDROCHLOROTHIAZIDE 12.5 MG PO TABS: 12.5000 mg | ORAL_TABLET | Freq: Every day | ORAL | Status: DC

## 2011-03-06 MED ORDER — LEVOTHYROXINE SODIUM 75 MCG PO TABS: 75.0000 ug | ORAL_TABLET | Freq: Every day | ORAL | Status: DC

## 2011-03-06 MED ORDER — POTASSIUM CHLORIDE CRYS ER 20 MEQ PO TBCR: 20.0000 meq | EXTENDED_RELEASE_TABLET | Freq: Every day | ORAL | Status: DC

## 2011-03-06 MED ORDER — AMLODIPINE BESYLATE 10 MG PO TABS: 10.0000 mg | ORAL_TABLET | Freq: Every day | ORAL | Status: DC

## 2011-03-06 NOTE — Patient Instructions (Addendum)
F/U in 5 months.  TSH, HBA1C and chem 7 today and in 5 months.   It is important that you exercise regularly at least 30 minutes 5 times a week. If you develop chest pain, have severe difficulty breathing, or feel very tired, stop exercising immediately and seek medical attention    A healthy diet is rich in fruit, vegetables and whole grains. Poultry fish, nuts and beans are a healthy choice for protein rather then red meat. A low sodium diet and drinking 64 ounces of water daily is generally recommended. Oils and sweet should be limited. Carbohydrates especially for those who are diabetic or overweight, should be limited to 34-45 gram per meal. It is important to eat on a regular schedule, at least 3 times daily. Snacks should be primarily fruits, vegetables or nuts.

## 2011-03-07 LAB — TSH: TSH: 2.387 u[IU]/mL (ref 0.350–4.500)

## 2011-03-07 MED ORDER — METFORMIN HCL 500 MG PO TABS: 500.0000 mg | ORAL_TABLET | Freq: Every day | ORAL | Status: DC

## 2011-03-07 NOTE — Assessment & Plan Note (Signed)
Deteriorated, will resume metformin

## 2011-03-11 ENCOUNTER — Encounter: Payer: Self-pay | Admitting: Family Medicine

## 2011-03-11 DIAGNOSIS — E119 Type 2 diabetes mellitus without complications: Secondary | ICD-10-CM | POA: Insufficient documentation

## 2011-03-11 NOTE — Assessment & Plan Note (Signed)
Deteriorated, deitary counseling done, and pt to resume metformin

## 2011-03-11 NOTE — Assessment & Plan Note (Signed)
Mild obstruction, pt uses syringe at home succesfully

## 2011-03-11 NOTE — Assessment & Plan Note (Signed)
Controlled, no change in medication  

## 2011-03-11 NOTE — Assessment & Plan Note (Signed)
Adequately controlled, no med change 

## 2011-03-11 NOTE — Assessment & Plan Note (Signed)
Increased symptoms as epected at this time of the year, use of barrier when mowing encouraged, continue med

## 2011-03-11 NOTE — Progress Notes (Signed)
  Subjective:    Patient ID: [name], male    DOB: [birth year], 70 y.o.   MRN: [id number]  HPI The PT is here for follow up and re-evaluation of chronic medical conditions, medication management and review of recent lab and radiology data.  Preventive health is updated, specifically  Cancer screening,  and Immunization.   Questions or concerns regarding consultations or procedures which the PT has had in the interim are  addressed. The PT denies any adverse reactions to current medications since the last visit.  There are no new concerns.  C/o increased allergy symptoms x 6 weks   Review of Systems Denies recent fever or chills. Denies sinus pressure,reports  nasal congestiondenies , ear pain or sore throat. Denies chest congestion, productive cough or wheezing. Denies chest pains, palpitations, paroxysmal nocturnal dyspnea, orthopnea and leg swelling Denies abdominal pain, nausea, vomiting,diarrhea or constipation.  Denies rectal bleeding or change in bowel movement. Denies dysuria, frequency, hesitancy or incontinence. Intermittent back pain and stiffness. Denies headaches, seizure, numbness, or tingling. Denies depression, anxiety or insomnia. Denies skin break down or rash.        Objective:   Physical Exam Patient alert and oriented and in no Cardiopulmonary distress.  HEENT: No facial asymmetry, EOMI, no sinus tenderness, TM's clear, Oropharynx pink and moist.  Neck supple no adenopathy.  Chest: Clear to auscultation bilaterally.  CVS: S1, S2 no murmurs, no S3.  ABD: Soft non tender. Bowel sounds normal.  Ext: No edema  MS: Adequate ROM spine, shoulders, hips and knees.  Skin: Intact, no ulcerations or rash noted.  Psych: Good eye contact, normal affect. Memory intact not anxious or depressed appearing.  CNS: CN 2-12 intact, power, tone and sensation normal throughout.        Assessment & Plan:

## 2011-03-12 ENCOUNTER — Other Ambulatory Visit: Payer: Self-pay | Admitting: Family Medicine

## 2011-04-03 ENCOUNTER — Telehealth: Payer: Self-pay | Admitting: Family Medicine

## 2011-04-04 NOTE — Telephone Encounter (Signed)
Patient states they have already sent the meter but doesn't look like the right thing, asked that I hold off on sending the form until he calls me back

## 2011-04-08 ENCOUNTER — Telehealth: Payer: Self-pay | Admitting: Family Medicine

## 2011-04-08 NOTE — Telephone Encounter (Signed)
noted 

## 2011-04-12 ENCOUNTER — Telehealth: Payer: Self-pay | Admitting: Family Medicine

## 2011-04-18 NOTE — Telephone Encounter (Signed)
NOTED AND WILL COMPLETE PAPERWORK

## 2011-04-19 ENCOUNTER — Telehealth: Payer: Self-pay | Admitting: Family Medicine

## 2011-04-19 NOTE — Telephone Encounter (Signed)
Noted, have paperwork, will complete

## 2011-04-25 ENCOUNTER — Telehealth: Payer: Self-pay | Admitting: Family Medicine

## 2011-04-26 NOTE — Telephone Encounter (Signed)
Completed and sent.

## 2011-04-26 NOTE — Telephone Encounter (Signed)
Completed and faxed.

## 2011-05-23 ENCOUNTER — Other Ambulatory Visit: Payer: Self-pay | Admitting: Family Medicine

## 2011-06-12 ENCOUNTER — Telehealth: Payer: Self-pay | Admitting: Family Medicine

## 2011-06-12 DIAGNOSIS — I10 Essential (primary) hypertension: Secondary | ICD-10-CM

## 2011-06-12 MED ORDER — POTASSIUM CHLORIDE CRYS ER 20 MEQ PO TBCR: 20.0000 meq | EXTENDED_RELEASE_TABLET | Freq: Every day | ORAL | Status: DC

## 2011-06-12 NOTE — Telephone Encounter (Signed)
Faxed to [facility] [employer].

## 2011-07-15 ENCOUNTER — Inpatient Hospital Stay (HOSPITAL_COMMUNITY)
Admission: EM | Admit: 2011-07-15 | Discharge: 2011-07-16 | Disposition: A | Discharge status: Other Acute Inpatient Hospital | Payer: Medicare HMO | Source: Home / Self Care | Attending: Internal Medicine | Admitting: Internal Medicine

## 2011-07-15 ENCOUNTER — Emergency Department (HOSPITAL_COMMUNITY): Payer: Medicare HMO

## 2011-07-15 ENCOUNTER — Other Ambulatory Visit: Payer: Self-pay

## 2011-07-15 ENCOUNTER — Encounter (HOSPITAL_COMMUNITY): Payer: Self-pay

## 2011-07-15 DIAGNOSIS — N179 Acute kidney failure, unspecified: Secondary | ICD-10-CM | POA: Diagnosis present

## 2011-07-15 DIAGNOSIS — E876 Hypokalemia: Secondary | ICD-10-CM | POA: Diagnosis not present

## 2011-07-15 DIAGNOSIS — R6521 Severe sepsis with septic shock: Secondary | ICD-10-CM | POA: Diagnosis present

## 2011-07-15 DIAGNOSIS — N390 Urinary tract infection, site not specified: Secondary | ICD-10-CM | POA: Diagnosis present

## 2011-07-15 DIAGNOSIS — A419 Sepsis, unspecified organism: Secondary | ICD-10-CM

## 2011-07-15 DIAGNOSIS — E039 Hypothyroidism, unspecified: Secondary | ICD-10-CM | POA: Diagnosis present

## 2011-07-15 HISTORY — DX: Sepsis, unspecified organism: A41.9

## 2011-07-15 LAB — CBC
Hemoglobin: 13.5 g/dL (ref 13.0–17.0)
MCH: 26.8 pg (ref 26.0–34.0)
MCV: 85.5 fL (ref 78.0–100.0)
RBC: 5.04 MIL/uL (ref 4.22–5.81)

## 2011-07-15 LAB — URINALYSIS, ROUTINE W REFLEX MICROSCOPIC
Ketones, ur: NEGATIVE mg/dL
Nitrite: POSITIVE — AB
Urobilinogen, UA: 1 mg/dL (ref 0.0–1.0)
pH: 6.5 (ref 5.0–8.0)

## 2011-07-15 LAB — BASIC METABOLIC PANEL
CO2: 26 mEq/L (ref 19–32)
GFR calc non Af Amer: 21 mL/min — ABNORMAL LOW (ref >90)
Glucose, Bld: 102 mg/dL — ABNORMAL HIGH (ref 70–99)
Potassium: 3.9 mEq/L (ref 3.5–5.1)
Sodium: 140 mEq/L (ref 135–145)

## 2011-07-15 LAB — TROPONIN I: Troponin I: <0.3 ng/mL (ref <0.30)

## 2011-07-15 LAB — CK TOTAL AND CKMB (NOT AT ARMC)
Relative Index: 2.1 (ref 0.0–2.5)
Total CK: 128 U/L (ref 7–232)

## 2011-07-15 MED ORDER — SODIUM CHLORIDE 0.9 % IV SOLN
Freq: Once | INTRAVENOUS | Status: AC
  Administered 2011-07-15 (×2): via INTRAVENOUS

## 2011-07-15 MED ORDER — IBUPROFEN 100 MG/5ML PO SUSP
ORAL | Status: AC
  Administered 2011-07-15
  Filled 2011-07-15: qty 40

## 2011-07-15 MED ORDER — IBUPROFEN 100 MG/5ML PO SUSP
800.0000 mg | Freq: Once | ORAL | Status: AC
  Administered 2011-07-15: 800 mg via ORAL

## 2011-07-15 MED ORDER — ACETAMINOPHEN 325 MG PO TABS
650.0000 mg | ORAL_TABLET | Freq: Once | ORAL | Status: AC
  Administered 2011-07-15: 650 mg via ORAL
  Filled 2011-07-15: qty 2

## 2011-07-15 MED ORDER — DEXTROSE 5 % IV SOLN
1.0000 g | Freq: Once | INTRAVENOUS | Status: AC
  Administered 2011-07-15: 1 g via INTRAVENOUS
  Filled 2011-07-15: qty 1

## 2011-07-15 MED ORDER — GENTAMICIN SULFATE 40 MG/ML IJ SOLN: 5.0000 mg/kg | Freq: Once | INTRAMUSCULAR | Status: DC

## 2011-07-15 MED ORDER — GENTAMICIN SULFATE 40 MG/ML IJ SOLN
5.0000 mg/kg | Freq: Once | INTRAVENOUS | Status: AC
  Administered 2011-07-16: 342 mg via INTRAVENOUS
  Filled 2011-07-15: qty 8.55

## 2011-07-15 NOTE — ED Notes (Signed)
Awake , talking,  Resting quietly.

## 2011-07-15 NOTE — ED Provider Notes (Addendum)
Scribed for Nelia Shi, MD, the patient was seen in room APA07/APA07 . This chart was scribed by Ellie Lunch. This patient's care was started at 9:34 PM.   CSN: [id number] Arrival date & time: 07/15/2011  8:20 PM  Chief Complaint  Patient presents with  . Weakness   (Consider location/radiation/quality/duration/timing/severity/associated sxs/prior treatment) HPI [name] is a 70 y.o. male who presents to the Emergency Department complaining of weakness.  Family reports that pt had cp last night, then when woke this am was unsteady and weak, has cont. All day. C/o associated chills, fever, n/v with 1 episode of emesis today. Denies cough.  No cough.   PCP Dr. Lodema Hong.   Past Medical History  Diagnosis Date  . Allergic rhinitis, seasonal   . Erectile dysfunction   . Hypothyroidism   . Hyperlipidemia   . Hypertension   . Diabetes mellitus, type 2   . Prostate cancer 2011    treated with radiation    Past Surgical History  Procedure Date  . Thyroidectomy 2003  . Transurethral resection of thr prostate 2003  Mass behind lungs removed ~12 years ago.  Family History  Problem Relation Age of Onset  . Stroke Mother   . Heart failure Mother   . Diabetes Father   . Kidney disease Brother   . Diabetes Brother     History  Substance Use Topics  . Smoking status: Former Games developer  . Smokeless tobacco: Not on file  . Alcohol Use: No    Review of Systems 10 Systems reviewed and are negative for acute change except as noted in the HPI.  Allergies  Peanut-containing drug products; Eggs or egg-derived products; Iodine; Shellfish allergy; and Penicillins  Home Medications   Current Outpatient Rx  Name Route Sig Dispense Refill  . AMLODIPINE BESYLATE 10 MG PO TABS Oral Take 10 mg by mouth daily. One tablet by mouth once daily for blood pressure      . CETIRIZINE HCL 10 MG PO TABS Oral Take 10 mg by mouth daily. One by mouth once daily for allergies     .  HYDROCHLOROTHIAZIDE 25 MG PO TABS Oral Take 25 mg by mouth daily. Blood pressure/fluid     . LEVOTHYROXINE SODIUM 75 MCG PO TABS Oral Take 75 mcg by mouth daily. For thyroid     . METFORMIN HCL 500 MG PO TABS Oral Take 500 mg by mouth daily with breakfast. For blood sugar     . THERA M PLUS PO TABS Oral Take 1 tablet by mouth daily.      . OXYBUTYNIN CHLORIDE 5 MG PO TABS  TAKE 1 TABLET BY MOUTH EVERY DAY FOR URINARY INCONTINENCE 30 tablet 3  . POTASSIUM CHLORIDE CRYS CR 20 MEQ PO TBCR Oral Take 20 mEq by mouth daily. Take one tablet by mouth once a day for potassium      . SIMVASTATIN 20 MG PO TABS Oral Take 20 mg by mouth every evening. For cholesterol      BP 118/89  Pulse 130  Temp(Src) 101.1 F (38.4 C) (Oral)  Resp 44  Ht 5\' 8"  (1.727 m)  Wt 160 lb (72.576 kg)  BMI 24.33 kg/m2  SpO2 97%  Physical Exam  Nursing note and vitals reviewed. Constitutional: He is oriented to person, place, and time. He appears well-developed and well-nourished.  HENT:  Head: Normocephalic and atraumatic.  Eyes: EOM are normal.  Neck: Normal range of motion.  Cardiovascular: Regular rhythm and normal heart sounds.  tachycardic  Pulmonary/Chest: Effort normal and breath sounds normal. No respiratory distress.  Abdominal: Soft. He exhibits no distension. There is no tenderness.  Musculoskeletal: Normal range of motion.  Neurological: He is alert and oriented to person, place, and time.  Skin: Skin is warm and dry.  Psychiatric: He has a normal mood and affect. Judgment normal.   Procedures (including critical care time)  OTHER DATA REVIEWED: Nursing notes, vital signs, and past medical records reviewed.  DIAGNOSTIC STUDIES: Oxygen Saturation is 97% on room air, normal by my interpretation.    EKG: Rate: 120 Sinus tachycardia with occasional Premature ventricular complexes Left axis deviation Nonspecific T wave abnormality Abnormal ECG No previous ECGs available LABS / RADIOLOGY:    Labs Reviewed  BASIC METABOLIC PANEL - Abnormal; Notable for the following:    Glucose, Bld 102 (*)    Creatinine, Ser 2.80 (*)    GFR calc non Af Amer 21 (*)    GFR calc Af Amer 24 (*)    All other components within normal limits  CBC - Abnormal; Notable for the following:    WBC 22.4 (*)    All other components within normal limits  URINALYSIS, ROUTINE W REFLEX MICROSCOPIC - Abnormal; Notable for the following:    Appearance HAZY (*)    Hgb urine dipstick LARGE (*)    Protein, ur 100 (*)    Nitrite POSITIVE (*)    Leukocytes, UA MODERATE (*)    All other components within normal limits  URINE MICROSCOPIC-ADD ON - Abnormal; Notable for the following:    Bacteria, UA MANY (*)    All other components within normal limits  CK TOTAL AND CKMB  TROPONIN I  CULTURE, BLOOD (ROUTINE X 2)  LACTIC ACID, PLASMA  CULTURE, BLOOD (ROUTINE X 2)   Dg Chest Portable 1 View  07/15/2011  *RADIOLOGY REPORT*  Clinical Data: Fever, chest pain, weakness  PORTABLE CHEST - 1 VIEW  Comparison: 11/16/2009  Findings: Stable postoperative changes in the mediastinum.  Shallow inspiration.  Normal heart size and pulmonary vascularity. Emphysematous changes in the lungs.  Fibrosis in the lung bases. No focal airspace consolidation.  No pneumothorax.  No significant change since prior study.  IMPRESSION: Emphysematous changes and fibrosis in the lungs.  No evidence of active pulmonary disease.  Original Report Authenticated By: Marlon Pel, M.D.    ED COURSE / COORDINATION OF CARE: [facility] Consulted and will see pt in ED  MDM:  Critical Care Time 30 min.   SCRIBE ATTESTATION:I personally performed the services described in this documentation, which was scribed in my presence. The recorded information has been reviewed and considered. Nelia Shi, MD          Nelia Shi, MD 07/16/11 1191  Nelia Shi, MD 08/22/11 [phone]

## 2011-07-15 NOTE — ED Notes (Signed)
Family reports that pt had cp last night, then when woke this am was unsteady and weak, has cont. All day.

## 2011-07-15 NOTE — ED Notes (Signed)
Pt began having chills.  Family at bedside and Dr Radford Pax in to see pt.  Given motrin for fever of 101.3 Rocephin started.

## 2011-07-15 NOTE — ED Notes (Signed)
Feels weak, fever,  Had chest pain last pm.  Nausea, sl cough.  Answer questions well. Knows family and phone no

## 2011-07-15 NOTE — ED Notes (Signed)
Beeped Hospitalist to [employer] phone.

## 2011-07-15 NOTE — ED Notes (Signed)
Resting quietly, no distress. Says he feels "about the same".  Foley draining yellow urine.  Family at bedside.

## 2011-07-15 NOTE — ED Notes (Signed)
Dr Radford Pax notifed of low bp,  Giving 1 liter bolus of n/s

## 2011-07-16 ENCOUNTER — Inpatient Hospital Stay (HOSPITAL_COMMUNITY): Payer: Medicare HMO

## 2011-07-16 ENCOUNTER — Inpatient Hospital Stay (HOSPITAL_COMMUNITY)
Admission: AD | Admit: 2011-07-16 | Discharge: 2011-07-20 | DRG: 871 | Disposition: A | Discharge status: Nursing Home (Includes ICF) | Payer: Medicare HMO | Source: Other Acute Inpatient Hospital | Attending: Internal Medicine | Admitting: Internal Medicine

## 2011-07-16 ENCOUNTER — Encounter (HOSPITAL_COMMUNITY): Payer: Self-pay | Admitting: Internal Medicine

## 2011-07-16 ENCOUNTER — Other Ambulatory Visit (HOSPITAL_COMMUNITY): Payer: Medicare HMO

## 2011-07-16 DIAGNOSIS — J9819 Other pulmonary collapse: Secondary | ICD-10-CM | POA: Diagnosis present

## 2011-07-16 DIAGNOSIS — A419 Sepsis, unspecified organism: Secondary | ICD-10-CM | POA: Diagnosis present

## 2011-07-16 DIAGNOSIS — E039 Hypothyroidism, unspecified: Secondary | ICD-10-CM | POA: Diagnosis present

## 2011-07-16 DIAGNOSIS — Q619 Cystic kidney disease, unspecified: Secondary | ICD-10-CM

## 2011-07-16 DIAGNOSIS — N179 Acute kidney failure, unspecified: Secondary | ICD-10-CM

## 2011-07-16 DIAGNOSIS — N390 Urinary tract infection, site not specified: Secondary | ICD-10-CM

## 2011-07-16 DIAGNOSIS — I428 Other cardiomyopathies: Secondary | ICD-10-CM | POA: Diagnosis present

## 2011-07-16 DIAGNOSIS — R6521 Severe sepsis with septic shock: Secondary | ICD-10-CM

## 2011-07-16 DIAGNOSIS — N400 Enlarged prostate without lower urinary tract symptoms: Secondary | ICD-10-CM | POA: Diagnosis present

## 2011-07-16 DIAGNOSIS — E876 Hypokalemia: Secondary | ICD-10-CM | POA: Diagnosis not present

## 2011-07-16 DIAGNOSIS — N170 Acute kidney failure with tubular necrosis: Secondary | ICD-10-CM | POA: Diagnosis present

## 2011-07-16 DIAGNOSIS — Z8546 Personal history of malignant neoplasm of prostate: Secondary | ICD-10-CM

## 2011-07-16 DIAGNOSIS — N211 Calculus in urethra: Secondary | ICD-10-CM | POA: Diagnosis present

## 2011-07-16 DIAGNOSIS — R6520 Severe sepsis without septic shock: Secondary | ICD-10-CM

## 2011-07-16 HISTORY — DX: Acquired absence of other part of head and neck: Z90.09

## 2011-07-16 HISTORY — DX: Acquired absence of other organs: Z90.89

## 2011-07-16 HISTORY — DX: Benign prostatic hyperplasia without lower urinary tract symptoms: N40.0

## 2011-07-16 HISTORY — DX: Postprocedural hypothyroidism: E89.0

## 2011-07-16 HISTORY — DX: Other specified postprocedural states: Z98.890

## 2011-07-16 LAB — COMPREHENSIVE METABOLIC PANEL
ALT: 15 U/L (ref 0–53)
AST: 97 U/L — ABNORMAL HIGH (ref 0–37)
Albumin: 2.6 g/dL — ABNORMAL LOW (ref 3.5–5.2)
Alkaline Phosphatase: 70 U/L (ref 39–117)
CO2: 19 mEq/L (ref 19–32)
Chloride: 107 mEq/L (ref 96–112)
Chloride: 106 mEq/L (ref 96–112)
Creatinine, Ser: 2.2 mg/dL — ABNORMAL HIGH (ref 0.50–1.35)
GFR calc Af Amer: 27 mL/min — ABNORMAL LOW (ref >90)
GFR calc non Af Amer: 23 mL/min — ABNORMAL LOW (ref >90)
Glucose, Bld: 74 mg/dL (ref 70–99)
Potassium: 3.2 mEq/L — ABNORMAL LOW (ref 3.5–5.1)
Potassium: 4.6 mEq/L (ref 3.5–5.1)
Sodium: 140 mEq/L (ref 135–145)
Total Bilirubin: 2.2 mg/dL — ABNORMAL HIGH (ref 0.3–1.2)

## 2011-07-16 LAB — PROTIME-INR
INR: 1.14 (ref 0.00–1.49)
INR: 1.61 — ABNORMAL HIGH (ref 0.00–1.49)
Prothrombin Time: 14.8 seconds (ref 11.6–15.2)
Prothrombin Time: 19.4 seconds — ABNORMAL HIGH (ref 11.6–15.2)

## 2011-07-16 LAB — GLUCOSE, CAPILLARY
Glucose-Capillary: 132 mg/dL — ABNORMAL HIGH (ref 70–99)
Glucose-Capillary: 174 mg/dL — ABNORMAL HIGH (ref 70–99)

## 2011-07-16 LAB — CBC
Hemoglobin: 12.4 g/dL — ABNORMAL LOW (ref 13.0–17.0)
MCH: 27.8 pg (ref 26.0–34.0)
MCHC: 32.7 g/dL (ref 30.0–36.0)
RBC: 4.38 MIL/uL (ref 4.22–5.81)
RDW: 14.3 % (ref 11.5–15.5)

## 2011-07-16 LAB — CARBOXYHEMOGLOBIN
Carboxyhemoglobin: 0.8 % (ref 0.5–1.5)
Carboxyhemoglobin: 0.9 % (ref 0.5–1.5)
Carboxyhemoglobin: 0.8 % (ref 0.5–1.5)
Methemoglobin: 1 % (ref 0.0–1.5)
O2 Saturation: 46.8 %
O2 Saturation: 63.3 %
O2 Saturation: 66.9 %
Total hemoglobin: 12.4 g/dL — ABNORMAL LOW (ref 13.5–18.0)
Total hemoglobin: 12.5 g/dL — ABNORMAL LOW (ref 13.5–18.0)

## 2011-07-16 LAB — DIFFERENTIAL
Basophils Absolute: 0 10*3/uL (ref 0.0–0.1)
Lymphocytes Relative: 1 % — ABNORMAL LOW (ref 12–46)
Neutro Abs: 28.6 10*3/uL — ABNORMAL HIGH (ref 1.7–7.7)
WBC Morphology: INCREASED

## 2011-07-16 LAB — URINALYSIS, ROUTINE W REFLEX MICROSCOPIC
Ketones, ur: NEGATIVE mg/dL
Nitrite: NEGATIVE
Protein, ur: 100 mg/dL — AB
Urobilinogen, UA: 1 mg/dL (ref 0.0–1.0)

## 2011-07-16 LAB — D-DIMER, QUANTITATIVE: D-Dimer, Quant: 10.34 ug/mL-FEU — ABNORMAL HIGH (ref 0.00–0.48)

## 2011-07-16 LAB — CARDIAC PANEL(CRET KIN+CKTOT+MB+TROPI)
CK, MB: 7.5 ng/mL (ref 0.3–4.0)
CK, MB: 8.9 ng/mL (ref 0.3–4.0)
Total CK: 493 U/L — ABNORMAL HIGH (ref 7–232)
Total CK: 709 U/L — ABNORMAL HIGH (ref 7–232)

## 2011-07-16 LAB — LACTIC ACID, PLASMA
Lactic Acid, Venous: 3.7 mmol/L — ABNORMAL HIGH (ref 0.5–2.2)
Lactic Acid, Venous: 4.4 mmol/L — ABNORMAL HIGH (ref 0.5–2.2)

## 2011-07-16 LAB — URINE MICROSCOPIC-ADD ON

## 2011-07-16 LAB — TYPE AND SCREEN: Antibody Screen: NEGATIVE

## 2011-07-16 LAB — SODIUM, URINE, RANDOM: Sodium, Ur: 27 mEq/L

## 2011-07-16 LAB — PROCALCITONIN: Procalcitonin: >175 ng/mL

## 2011-07-16 LAB — ABO/RH: ABO/RH(D): O POS

## 2011-07-16 MED ORDER — DEXTROSE 5 % IV SOLN
1.0000 g | INTRAVENOUS | Status: DC
  Filled 2011-07-16: qty 1

## 2011-07-16 MED ORDER — SODIUM CHLORIDE 0.9 % IV SOLN
Freq: Once | INTRAVENOUS | Status: AC
  Administered 2011-07-16: 01:00:00 via INTRAVENOUS

## 2011-07-16 MED ORDER — SODIUM CHLORIDE 0.9 % IJ SOLN: 10.0000 mL | Freq: Two times a day (BID) | INTRAMUSCULAR | Status: DC

## 2011-07-16 MED ORDER — SODIUM CHLORIDE 0.9 % IV SOLN
INTRAVENOUS | Status: DC
  Administered 2011-07-16 (×2): via INTRAVENOUS

## 2011-07-16 MED ORDER — NOREPINEPHRINE BITARTRATE 1 MG/ML IJ SOLN
2.0000 ug/min | INTRAVENOUS | Status: DC
  Filled 2011-07-16: qty 8

## 2011-07-16 MED ORDER — HYDROCORTISONE SOD SUCCINATE 100 MG IJ SOLR
100.0000 mg | Freq: Once | INTRAMUSCULAR | Status: AC
  Administered 2011-07-16: 100 mg via INTRAVENOUS
  Filled 2011-07-16: qty 2

## 2011-07-16 MED ORDER — VASOPRESSIN 20 UNIT/ML IJ SOLN
0.0300 [IU]/min | INTRAVENOUS | Status: DC
  Administered 2011-07-16: 0.03 [IU]/min via INTRAVENOUS
  Filled 2011-07-16: qty 2.5

## 2011-07-16 MED ORDER — HYDROCORTISONE SOD SUCCINATE 100 MG IJ SOLR
INTRAMUSCULAR | Status: AC
  Filled 2011-07-16: qty 2

## 2011-07-16 MED ORDER — ACETAMINOPHEN 650 MG RE SUPP: 650.0000 mg | Freq: Four times a day (QID) | RECTAL | Status: DC | PRN

## 2011-07-16 MED ORDER — SODIUM CHLORIDE 0.9 % IV BOLUS (SEPSIS)
250.0000 mL | Freq: Once | INTRAVENOUS | Status: AC
  Administered 2011-07-16: 250 mL via INTRAVENOUS

## 2011-07-16 MED ORDER — SODIUM CHLORIDE 0.9 % IJ SOLN: 10.0000 mL | INTRAMUSCULAR | Status: DC | PRN

## 2011-07-16 MED ORDER — NOREPINEPHRINE BITARTRATE 1 MG/ML IJ SOLN
2.0000 ug/min | INTRAVENOUS | Status: DC
  Administered 2011-07-16: 18 ug/min via INTRAVENOUS
  Administered 2011-07-16: 5.3 ug/min via INTRAVENOUS
  Filled 2011-07-16 (×2): qty 4

## 2011-07-16 MED ORDER — HYDROCORTISONE SOD SUCCINATE 100 MG IJ SOLR
100.0000 mg | Freq: Four times a day (QID) | INTRAMUSCULAR | Status: DC
  Administered 2011-07-16: 100 mg via INTRAVENOUS
  Filled 2011-07-16: qty 2

## 2011-07-16 MED ORDER — POTASSIUM CHLORIDE CRYS ER 20 MEQ PO TBCR
20.0000 meq | EXTENDED_RELEASE_TABLET | Freq: Once | ORAL | Status: AC
  Administered 2011-07-16: 20 meq via ORAL
  Filled 2011-07-16: qty 1

## 2011-07-16 MED ORDER — ALBUTEROL SULFATE (5 MG/ML) 0.5% IN NEBU: 2.5000 mg | INHALATION_SOLUTION | RESPIRATORY_TRACT | Status: DC | PRN

## 2011-07-16 MED ORDER — SODIUM CHLORIDE 0.9 % IV BOLUS (SEPSIS)
1000.0000 mL | Freq: Once | INTRAVENOUS | Status: AC
  Administered 2011-07-16: 1000 mL via INTRAVENOUS

## 2011-07-16 MED ORDER — ENOXAPARIN SODIUM 40 MG/0.4ML ~~LOC~~ SOLN: 40.0000 mg | SUBCUTANEOUS | Status: DC

## 2011-07-16 MED ORDER — ACETAMINOPHEN 325 MG PO TABS: 650.0000 mg | ORAL_TABLET | Freq: Four times a day (QID) | ORAL | Status: DC | PRN

## 2011-07-16 MED ORDER — SODIUM CHLORIDE 0.9 % IV BOLUS (SEPSIS): 500.0000 mL | Freq: Once | INTRAVENOUS | Status: DC

## 2011-07-16 MED ORDER — NOREPINEPHRINE BITARTRATE 1 MG/ML IJ SOLN
INTRAMUSCULAR | Status: AC
  Filled 2011-07-16: qty 4

## 2011-07-16 MED ORDER — PANTOPRAZOLE SODIUM 40 MG PO TBEC: 40.0000 mg | DELAYED_RELEASE_TABLET | Freq: Every day | ORAL | Status: DC

## 2011-07-16 MED ORDER — POTASSIUM CHLORIDE CRYS ER 20 MEQ PO TBCR: 40.0000 meq | EXTENDED_RELEASE_TABLET | Freq: Once | ORAL | Status: DC

## 2011-07-16 MED ORDER — ACETAMINOPHEN 500 MG PO TABS
1000.0000 mg | ORAL_TABLET | Freq: Once | ORAL | Status: AC
  Administered 2011-07-16: 1000 mg via ORAL
  Filled 2011-07-16: qty 2

## 2011-07-16 MED ORDER — LEVOTHYROXINE SODIUM 75 MCG PO TABS
75.0000 ug | ORAL_TABLET | Freq: Every day | ORAL | Status: DC
  Administered 2011-07-16: 75 ug via ORAL
  Filled 2011-07-16: qty 1

## 2011-07-16 MED ORDER — LIDOCAINE HCL (PF) 1 % IJ SOLN
INTRAMUSCULAR | Status: AC
  Filled 2011-07-16: qty 5

## 2011-07-16 MED ORDER — GENTAMICIN SULFATE 40 MG/ML IJ SOLN
INTRAMUSCULAR | Status: AC
  Filled 2011-07-16: qty 8

## 2011-07-16 MED ORDER — SODIUM CHLORIDE 0.9 % IV BOLUS (SEPSIS)
500.0000 mL | Freq: Once | INTRAVENOUS | Status: AC
  Administered 2011-07-16: 500 mL via INTRAVENOUS

## 2011-07-16 NOTE — Procedures (Signed)
Central Venous Catheter Insertion Procedure Note HAMZAH SAVOCA [id number] [birth year]  Procedure: Insertion of Central Venous Catheter Indications: sepsis  Procedure Details Consent: Risks of procedure as well as the alternatives and risks of each were explained to the (patient/caregiver).  Consent for procedure obtained. Time Out: Verified patient identification, verified procedure, site/side was marked, verified correct patient position, special equipment/implants available, medications/allergies/relevent history reviewed, required imaging and test results available.  Performed  Maximum sterile technique was used including antiseptics, cap, gloves, gown, hand hygiene, mask and sheet. Skin prep: Chlorhexidine; local anesthetic administered A antimicrobial bonded/coated triple lumen catheter was placed in the right subclavian vein using the Seldinger technique.  Evaluation Blood flow good Complications: No apparent complications Patient did tolerate procedure well. Chest X-ray ordered to verify placement.  CXR: pending.  Jeno Calleros A 07/16/2011, 9:01 AM

## 2011-07-16 NOTE — Progress Notes (Signed)
Dr Rito Ehrlich updated on ckmb=7.5

## 2011-07-16 NOTE — Progress Notes (Signed)
Subjective: Patient is easily arousable. Patient is mentating. The patient does feel clammy. Patient denies any chest pain. Patient denies any shortness of breath. Patient denies any abdominal pain.  Objective: Vital signs in last 24 hours: Filed Vitals:   07/16/11 0515 07/16/11 0530 07/16/11 0545 07/16/11 0600  BP: 81/56 74/52 78/62  74/52  Pulse: 109 108 108 106  Temp:      TempSrc:      Resp: 17 18 25 21   Height:      Weight:      SpO2: 98% 98% 98% 98%    Intake/Output Summary (Last 24 hours) at 07/16/11 0821 Last data filed at 07/16/11 0600  Gross per 24 hour  Intake 4134.92 ml  Output    200 ml  Net 3934.92 ml    Weight change:   General: Alert, awake, oriented x3, in no acute cardiopulmonary distress. Clammy. HEENT: No bruits, no goiter. Heart: Tachycardic with regular rhythm, without murmurs, rubs, gallops. Lungs: Bibasilar crackles. No Wheezing. Abdomen: Soft, nontender, nondistended, positive bowel sounds. Extremities: No clubbing cyanosis or edema with positive pedal pulses. Neuro: Grossly intact, nonfocal.    Lab Results: Results for orders placed during the hospital encounter of 07/15/11 (from the past 24 hour(s))  BASIC METABOLIC PANEL     Status: Abnormal   Collection Time   07/15/11  8:44 PM      Component Value Range   Sodium 140  135 - 145 (mEq/L)   Potassium 3.9  3.5 - 5.1 (mEq/L)   Chloride 101  96 - 112 (mEq/L)   CO2 26  19 - 32 (mEq/L)   Glucose, Bld 102 (*) 70 - 99 (mg/dL)   BUN 23  6 - 23 (mg/dL)   Creatinine, Ser 1.61 (*) 0.50 - 1.35 (mg/dL)   Calcium 9.5  8.4 - 09.6 (mg/dL)   GFR calc non Af Amer 21 (*) >90 (mL/min)   GFR calc Af Amer 24 (*) >90 (mL/min)  CBC     Status: Abnormal   Collection Time   07/15/11  8:44 PM      Component Value Range   WBC 22.4 (*) 4.0 - 10.5 (K/uL)   RBC 5.04  4.22 - 5.81 (MIL/uL)   Hemoglobin 13.5  13.0 - 17.0 (g/dL)   HCT 04.5  40.9 - 81.1 (%)   MCV 85.5  78.0 - 100.0 (fL)   MCH 26.8  26.0 - 34.0 (pg)     MCHC 31.3  30.0 - 36.0 (g/dL)   RDW 91.4  78.2 - 95.6 (%)   Platelets 182  150 - 400 (K/uL)  CK TOTAL AND CKMB     Status: Normal   Collection Time   07/15/11  8:44 PM      Component Value Range   Total CK 128  7 - 232 (U/L)   CK, MB 2.7  0.3 - 4.0 (ng/mL)   Relative Index 2.1  0.0 - 2.5   TROPONIN I     Status: Normal   Collection Time   07/15/11  8:44 PM      Component Value Range   Troponin I <0.30  <0.30 (ng/mL)  URINALYSIS, ROUTINE W REFLEX MICROSCOPIC     Status: Abnormal   Collection Time   07/15/11 10:13 PM      Component Value Range   Color, Urine YELLOW  YELLOW    Appearance HAZY (*) CLEAR    Specific Gravity, Urine 1.010  1.005 - 1.030    pH 6.5  5.0 -  8.0    Glucose, UA NEGATIVE  NEGATIVE (mg/dL)   Hgb urine dipstick LARGE (*) NEGATIVE    Bilirubin Urine NEGATIVE  NEGATIVE    Ketones, ur NEGATIVE  NEGATIVE (mg/dL)   Protein, ur 161 (*) NEGATIVE (mg/dL)   Urobilinogen, UA 1.0  0.0 - 1.0 (mg/dL)   Nitrite POSITIVE (*) NEGATIVE    Leukocytes, UA MODERATE (*) NEGATIVE   URINE MICROSCOPIC-ADD ON     Status: Abnormal   Collection Time   07/15/11 10:13 PM      Component Value Range   Squamous Epithelial / LPF RARE  RARE    WBC, UA 21-50  <3 (WBC/hpf)   RBC / HPF 21-50  <3 (RBC/hpf)   Bacteria, UA MANY (*) RARE   LACTIC ACID, PLASMA     Status: Abnormal   Collection Time   07/15/11 10:21 PM      Component Value Range   Lactic Acid, Venous 4.5 (*) 0.5 - 2.2 (mmol/L)  CULTURE, BLOOD (ROUTINE X 2)     Status: Normal (Preliminary result)   Collection Time   07/15/11 10:21 PM      Component Value Range   Specimen Description Blood RIGHT ANTECUBITAL     Special Requests BOTTLES DRAWN AEROBIC AND ANAEROBIC 6CC     Culture PENDING     Report Status PENDING    CULTURE, BLOOD (ROUTINE X 2)     Status: Normal (Preliminary result)   Collection Time   07/15/11 10:21 PM      Component Value Range   Specimen Description BLOOD BLOOD RIGHT HAND     Special Requests BOTTLES  DRAWN AEROBIC ONLY 5CC     Culture PENDING     Report Status PENDING    MRSA PCR SCREENING     Status: Normal   Collection Time   07/16/11  1:56 AM      Component Value Range   MRSA by PCR NEGATIVE  NEGATIVE   CARDIAC PANEL(CRET KIN+CKTOT+MB+TROPI)     Status: Abnormal   Collection Time   07/16/11  2:32 AM      Component Value Range   Total CK 493 (*) 7 - 232 (U/L)   CK, MB 7.5 (*) 0.3 - 4.0 (ng/mL)   Troponin I <0.30  <0.30 (ng/mL)   Relative Index 1.5  0.0 - 2.5   COMPREHENSIVE METABOLIC PANEL     Status: Abnormal   Collection Time   07/16/11  2:33 AM      Component Value Range   Sodium 140  135 - 145 (mEq/L)   Potassium 3.2 (*) 3.5 - 5.1 (mEq/L)   Chloride 107  96 - 112 (mEq/L)   CO2 19  19 - 32 (mEq/L)   Glucose, Bld 74  70 - 99 (mg/dL)   BUN 24 (*) 6 - 23 (mg/dL)   Creatinine, Ser 0.96 (*) 0.50 - 1.35 (mg/dL)   Calcium 7.8 (*) 8.4 - 10.5 (mg/dL)   Total Protein 5.5 (*) 6.0 - 8.3 (g/dL)   Albumin 2.6 (*) 3.5 - 5.2 (g/dL)   AST 31  0 - 37 (U/L)   ALT 15  0 - 53 (U/L)   Alkaline Phosphatase 70  39 - 117 (U/L)   Total Bilirubin 1.3 (*) 0.3 - 1.2 (mg/dL)   GFR calc non Af Amer 23 (*) >90 (mL/min)   GFR calc Af Amer 27 (*) >90 (mL/min)  CBC     Status: Abnormal   Collection Time   07/16/11  2:33 AM  Component Value Range   WBC 19.7 (*) 4.0 - 10.5 (K/uL)   RBC 4.43  4.22 - 5.81 (MIL/uL)   Hemoglobin 12.3 (*) 13.0 - 17.0 (g/dL)   HCT 16.1 (*) 09.6 - 52.0 (%)   MCV 84.9  78.0 - 100.0 (fL)   MCH 27.8  26.0 - 34.0 (pg)   MCHC 32.7  30.0 - 36.0 (g/dL)   RDW 04.5  40.9 - 81.1 (%)   Platelets 124 (*) 150 - 400 (K/uL)  PROTIME-INR     Status: Normal   Collection Time   07/16/11  2:51 AM      Component Value Range   Prothrombin Time 14.8  11.6 - 15.2 (seconds)   INR 1.14  0.00 - 1.49   APTT     Status: Normal   Collection Time   07/16/11  2:51 AM      Component Value Range   aPTT 33  24 - 37 (seconds)  LACTIC ACID, PLASMA     Status: Abnormal   Collection Time    07/16/11  6:02 AM      Component Value Range   Lactic Acid, Venous 3.7 (*) 0.5 - 2.2 (mmol/L)  PROCALCITONIN     Status: Normal   Collection Time   07/16/11  6:03 AM      Component Value Range   Procalcitonin >175.00    DIFFERENTIAL     Status: Abnormal   Collection Time   07/16/11  6:03 AM      Component Value Range   Neutrophils Relative 96 (*) 43 - 77 (%)   Neutro Abs 28.6 (*) 1.7 - 7.7 (K/uL)   Lymphocytes Relative 1 (*) 12 - 46 (%)   Lymphs Abs 0.4 (*) 0.7 - 4.0 (K/uL)   Monocytes Relative 3  3 - 12 (%)   Monocytes Absolute 0.8  0.1 - 1.0 (K/uL)   Eosinophils Relative 0  0 - 5 (%)   Eosinophils Absolute 0.0  0.0 - 0.7 (K/uL)   Basophils Relative 0  0 - 1 (%)   Basophils Absolute 0.0  0.0 - 0.1 (K/uL)   WBC Morphology INCREASED BANDS (>20% BANDS)       Micro: Recent Results (from the past 240 hour(s))  CULTURE, BLOOD (ROUTINE X 2)     Status: Normal (Preliminary result)   Collection Time   07/15/11 10:21 PM      Component Value Range Status Comment   Specimen Description Blood RIGHT ANTECUBITAL   Final    Special Requests BOTTLES DRAWN AEROBIC AND ANAEROBIC 6CC   Final    Culture PENDING   Incomplete    Report Status PENDING   Incomplete   CULTURE, BLOOD (ROUTINE X 2)     Status: Normal (Preliminary result)   Collection Time   07/15/11 10:21 PM      Component Value Range Status Comment   Specimen Description BLOOD BLOOD RIGHT HAND   Final    Special Requests BOTTLES DRAWN AEROBIC ONLY 5CC   Final    Culture PENDING   Incomplete    Report Status PENDING   Incomplete   MRSA PCR SCREENING     Status: Normal   Collection Time   07/16/11  1:56 AM      Component Value Range Status Comment   MRSA by PCR NEGATIVE  NEGATIVE  Final     Studies/Results: Dg Chest Portable 1 View  07/15/2011  *RADIOLOGY REPORT*  Clinical Data: Fever, chest pain, weakness  PORTABLE CHEST -  1 VIEW  Comparison: 11/16/2009  Findings: Stable postoperative changes in the mediastinum.  Shallow  inspiration.  Normal heart size and pulmonary vascularity. Emphysematous changes in the lungs.  Fibrosis in the lung bases. No focal airspace consolidation.  No pneumothorax.  No significant change since prior study.  IMPRESSION: Emphysematous changes and fibrosis in the lungs.  No evidence of active pulmonary disease.  Original Report Authenticated By: Marlon Pel, M.D.    Medications:    . sodium chloride   Intravenous Once  . sodium chloride   Intravenous Once  . acetaminophen  1,000 mg Oral Once  . acetaminophen  650 mg Oral Once  . cefTRIAXone (ROCEPHIN) IVPB 1 gram/50 mL D5W  1 g Intravenous Once  . cefTRIAXone (ROCEPHIN) IV  1 g Intravenous Q24H  . gentamicin  5 mg/kg (Ideal) Intravenous Once  . hydrocortisone sodium succinate  100 mg Intravenous Once  . hydrocortisone sodium succinate  100 mg Intravenous Q6H  . ibuprofen  800 mg Oral Once  . ibuprofen      . levothyroxine  75 mcg Oral Daily  . potassium chloride  20 mEq Oral Once  . sodium chloride  1,000 mL Intravenous Once  . sodium chloride  250 mL Intravenous Once  . sodium chloride  500 mL Intravenous Once  . DISCONTD: gentamicin  5 mg/kg (Order-Specific) Intramuscular Once     Assessment/Plan Principal Problem:  *Septic shock Active Problems:  Unspecified hypothyroidism  UTI (lower urinary tract infection)  ARF (acute renal failure) Hypokalemia   #1 septic shock, secondary to urinary tract infection: Lactic acid elevated. SBP in 70-80s. Urine cultures and blood cultures are pending. Pro calcitonin level is pending. Patient is currently afebrile. Leukocytosis is slowly improving. And patient has already received 4 L IVF and on levophed at 14 mcg and on stress dose steroids.  Abdominal ultrasound is pending. His echocardiogram from earlier this summer showed EF of about 40-45%, so, we need to be to be cautious with IV hydration. General surgery has been consulted for central line placement and is pending. Will  give a 500 cc bolus off on normal saline. We'll titrate levophed up to 18 mcg. Once central line is placed will place patient on vasopressin. We'll also place arterial line.  Continue empiric IV Rocephin and gentamicin. I have discussed the case with Dr. Vassie Loll of critical care medicine, and will transport the patient to [facility] to the ICU for management of his acute septic shock. #2 acute cystitis- urine cultures are pending. Leukocytosis is slowly trending down. Continue empiric IV Rocephin. #2 history of hypothyroidism:  Check TSH and Free T4.  Continue levothyroxine. #3 history of prostate cancer.  #4 acute renal failure: Likely secondary to prerenal azotemia secondary to problem #1 . His creatinine from earlier this year was 1.28.  Renal function slowly improving from admission with IV fluids. Will check a fractional excretion of sodium. Abdominal ultrasound is pending. Continue IV fluids and follow. #5 hypokalemia- check a magnesium level. Replete. # 6 leukocytosis- Likely secondary to urinary tract infection. Blood cultures and urine cultures are pending. Chest x-ray was unremarkable. Continue empiric IV Rocephin and gentamicin. Patient is a full code.  Prophylaxis- Protonix for GI prophylaxis. Lovenox for DVT prophylaxis.  Further management decisions will depend on results of further testing and patient's response to treatment.     LOS: 1 day   Concettina Leth 07/16/2011, 8:21 AM

## 2011-07-16 NOTE — Progress Notes (Signed)
Patient's BP still low despite multiple fluid boluses. UO not entirely adequate. Will need measurement of CVP to guide fluid therapy. Will proceed with central line. Discussed with Dr. Lovell Sheehan (surgeon) who will attempt placement of central line this morning. Will check lactic acid and procalcitonin levels. Patient alert per RN. Will monitor.

## 2011-07-16 NOTE — H&P (Addendum)
[name] is an 70 y.o. male.    PCP: Syliva Overman, MD, MD   Chief Complaint: Fever and chills  HPI: This is a 70 year old, African American male, with a past medical history of hypertension, hypothyroidism, and diabetes, who was in his usual state of health till yesterday when he started having chills and shakes. He felt dehydrated, had a couple episodes of vomiting. Unfortunately, he did not check his temperature at home. His wife mentioned that he was complaining of chest pain yesterday, but currently denies any chest pains. He has been having some lower abdominal discomfort, but hasn't had any diarrhea or any dysuria. Denies any recent urological procedures. He does have a history of prostate cancer and has had the TURP in the past. Denies dizziness or lightheadedness. Denies any syncopal episodes. Denies any falls. He is followed by his urologist, Dr. Mitzi Hansen as an outpatient. The last time he saw him was in September for a routine visit.   Prior to Admission medications   Medication Sig Start Date End Date Taking? Authorizing Provider  amLODipine (NORVASC) 10 MG tablet Take 10 mg by mouth daily. One tablet by mouth once daily for blood pressure   03/06/11  Yes Syliva Overman, MD  cetirizine (ZYRTEC ALLERGY) 10 MG tablet Take 10 mg by mouth daily. One by mouth once daily for allergies    Yes Historical Provider, MD  hydrochlorothiazide (HYDRODIURIL) 25 MG tablet Take 25 mg by mouth daily. Blood pressure/fluid    Yes Historical Provider, MD  levothyroxine (SYNTHROID, LEVOTHROID) 75 MCG tablet Take 75 mcg by mouth daily. For thyroid  03/06/11  Yes Syliva Overman, MD  metFORMIN (GLUCOPHAGE) 500 MG tablet Take 500 mg by mouth daily with breakfast. For blood sugar  03/07/11 03/06/12 Yes Syliva Overman, MD  Multiple Vitamins-Minerals (MULTIVITAMINS THER. W/MINERALS) TABS Take 1 tablet by mouth daily.     Yes Historical Provider, MD  oxybutynin (DITROPAN) 5 MG tablet TAKE 1 TABLET BY  MOUTH EVERY DAY FOR URINARY INCONTINENCE 05/23/11  Yes Syliva Overman, MD  potassium chloride SA (K-DUR,KLOR-CON) 20 MEQ tablet Take 20 mEq by mouth daily. Take one tablet by mouth once a day for potassium   06/12/11  Yes Syliva Overman, MD  simvastatin (ZOCOR) 20 MG tablet Take 20 mg by mouth every evening. For cholesterol   Yes Historical Provider, MD    Allergies:  Allergies  Allergen Reactions  . Peanut-Containing Drug Products Shortness Of Breath and Swelling  . Eggs Or Egg-Derived Products Swelling  . Iodine Swelling  . Shellfish Allergy Swelling  . Penicillins Hives and Rash    Past Medical History  Diagnosis Date  . Allergic rhinitis, seasonal   . Erectile dysfunction   . Hypothyroidism   . Hyperlipidemia   . Hypertension   . Diabetes mellitus, type 2   . Prostate cancer 2011    treated with radiation    Past Surgical History  Procedure Date  . Thyroidectomy 2003  . Transurethral resection of thr prostate 2003    Social History:  reports that he has quit smoking. He does not have any smokeless tobacco history on file. He reports that he does not drink alcohol or use illicit drugs.  Family History:  Family History  Problem Relation Age of Onset  . Stroke Mother   . Heart failure Mother   . Diabetes Father   . Hypertension Father   . Kidney disease Brother   . Diabetes Brother     Review of Systems  Constitutional: Positive for fever and chills.  HENT: Negative.   Eyes: Negative.   Respiratory: Negative.   Cardiovascular: Positive for chest pain.  Gastrointestinal: Negative.   Genitourinary: Negative.   Musculoskeletal: Negative.   Skin: Negative.   Neurological: Positive for weakness.  Endo/Heme/Allergies: Negative.   Psychiatric/Behavioral: Negative.    Blood pressure 82/53, pulse 128, temperature 101.3 F (38.5 C), temperature source Oral, resp. rate 44, height 5\' 8"  (1.727 m), weight 72.576 kg (160 lb), SpO2 95.00%. Physical Exam  Vitals  reviewed. Constitutional: He appears well-developed and well-nourished. No distress.  HENT:  Head: Normocephalic and atraumatic.  Mouth/Throat: No oropharyngeal exudate.  Eyes: EOM are normal. Pupils are equal, round, and reactive to light.  Neck: Normal range of motion. Neck supple. No tracheal deviation present. No thyromegaly present.  Cardiovascular: Normal rate and regular rhythm.  Exam reveals no friction rub.   No murmur heard. Pulmonary/Chest: Effort normal. No stridor. No respiratory distress. He has no wheezes. He has no rales. He exhibits no tenderness.  Abdominal: Soft. Bowel sounds are normal. He exhibits no distension and no mass. There is no tenderness. There is no rebound and no guarding.  Musculoskeletal: Normal range of motion.  Lymphadenopathy:    He has no cervical adenopathy.  Neurological: He is alert. He displays normal reflexes. No cranial nerve deficit. Coordination normal.  Skin: Skin is warm. No rash noted. He is not diaphoretic. No erythema.  Psychiatric: He has a normal mood and affect.    Results for orders placed during the hospital encounter of 07/15/11 (from the past 48 hour(s))  BASIC METABOLIC PANEL     Status: Abnormal   Collection Time   07/15/11  8:44 PM      Component Value Range Comment   Sodium 140  135 - 145 (mEq/L)    Potassium 3.9  3.5 - 5.1 (mEq/L)    Chloride 101  96 - 112 (mEq/L)    CO2 26  19 - 32 (mEq/L)    Glucose, Bld 102 (*) 70 - 99 (mg/dL)    BUN 23  6 - 23 (mg/dL)    Creatinine, Ser 1.61 (*) 0.50 - 1.35 (mg/dL)    Calcium 9.5  8.4 - 10.5 (mg/dL)    GFR calc non Af Amer 21 (*) >90 (mL/min)    GFR calc Af Amer 24 (*) >90 (mL/min)   CBC     Status: Abnormal   Collection Time   07/15/11  8:44 PM      Component Value Range Comment   WBC 22.4 (*) 4.0 - 10.5 (K/uL)    RBC 5.04  4.22 - 5.81 (MIL/uL)    Hemoglobin 13.5  13.0 - 17.0 (g/dL)    HCT 09.6  04.5 - 40.9 (%)    MCV 85.5  78.0 - 100.0 (fL)    MCH 26.8  26.0 - 34.0 (pg)     MCHC 31.3  30.0 - 36.0 (g/dL)    RDW 81.1  91.4 - 78.2 (%)    Platelets 182  150 - 400 (K/uL)   CK TOTAL AND CKMB     Status: Normal   Collection Time   07/15/11  8:44 PM      Component Value Range Comment   Total CK 128  7 - 232 (U/L)    CK, MB 2.7  0.3 - 4.0 (ng/mL)    Relative Index 2.1  0.0 - 2.5    TROPONIN I     Status: Normal   Collection Time  07/15/11  8:44 PM      Component Value Range Comment   Troponin I <0.30  <0.30 (ng/mL)   URINALYSIS, ROUTINE W REFLEX MICROSCOPIC     Status: Abnormal   Collection Time   07/15/11 10:13 PM      Component Value Range Comment   Color, Urine YELLOW  YELLOW     Appearance HAZY (*) CLEAR     Specific Gravity, Urine 1.010  1.005 - 1.030     pH 6.5  5.0 - 8.0     Glucose, UA NEGATIVE  NEGATIVE (mg/dL)    Hgb urine dipstick LARGE (*) NEGATIVE     Bilirubin Urine NEGATIVE  NEGATIVE     Ketones, ur NEGATIVE  NEGATIVE (mg/dL)    Protein, ur 161 (*) NEGATIVE (mg/dL)    Urobilinogen, UA 1.0  0.0 - 1.0 (mg/dL)    Nitrite POSITIVE (*) NEGATIVE     Leukocytes, UA MODERATE (*) NEGATIVE    URINE MICROSCOPIC-ADD ON     Status: Abnormal   Collection Time   07/15/11 10:13 PM      Component Value Range Comment   Squamous Epithelial / LPF RARE  RARE     WBC, UA 21-50  <3 (WBC/hpf)    RBC / HPF 21-50  <3 (RBC/hpf)    Bacteria, UA MANY (*) RARE    LACTIC ACID, PLASMA     Status: Abnormal   Collection Time   07/15/11 10:21 PM      Component Value Range Comment   Lactic Acid, Venous 4.5 (*) 0.5 - 2.2 (mmol/L)   CULTURE, BLOOD (ROUTINE X 2)     Status: Normal (Preliminary result)   Collection Time   07/15/11 10:21 PM      Component Value Range Comment   Specimen Description Blood RIGHT ANTECUBITAL      Special Requests BOTTLES DRAWN AEROBIC AND ANAEROBIC 6CC      Culture PENDING      Report Status PENDING     CULTURE, BLOOD (ROUTINE X 2)     Status: Normal (Preliminary result)   Collection Time   07/15/11 10:21 PM      Component Value Range Comment    Specimen Description BLOOD BLOOD RIGHT HAND      Special Requests BOTTLES DRAWN AEROBIC ONLY 5CC      Culture PENDING      Report Status PENDING      Dg Chest Portable 1 View  07/15/2011  *RADIOLOGY REPORT*  Clinical Data: Fever, chest pain, weakness  PORTABLE CHEST - 1 VIEW  Comparison: 11/16/2009  Findings: Stable postoperative changes in the mediastinum.  Shallow inspiration.  Normal heart size and pulmonary vascularity. Emphysematous changes in the lungs.  Fibrosis in the lung bases. No focal airspace consolidation.  No pneumothorax.  No significant change since prior study.  IMPRESSION: Emphysematous changes and fibrosis in the lungs.  No evidence of active pulmonary disease.  Original Report Authenticated By: Marlon Pel, M.D.   EKG shows sinus tachycardia at 121 beats per minute. His car. Left axis deviation. Intervals appear to be in the normal range. PVC is noted. No concerning ST or T-wave changes are seen. No Q waves are noted.  Assessment/Plan  Principal Problem:  *Septic shock Active Problems:  Unspecified hypothyroidism  UTI (lower urinary tract infection)  ARF (acute renal failure)   #1 septic shock, secondary to urinary tract infection: Patient will be drawn ceftriaxone and gentamicin for now. Urine cultures and blood cultures will be obtained.  Aggressive IV hydration will provided. Hydrocortisone intravenously will be given for stress dose. If his blood pressure does not improve Levophed will be utilized. And, if needed a central line will also be placed. His echocardiogram from earlier this summer showed EF of about 40-45%, so, we need to be to be cautious with IV hydration.  #2 history of hypothyroidism: Continue with l-thyroxine. Check TSH and Free T4.  #3 history of prostate cancer.  #4 acute renal failure: We will give IV fluids, and monitor his renal function closely. His creatinine from earlier this year was 1.28.  Patient is a full code.  DVT,  prophylaxis will utilize.  Further management decisions will depend on results of further testing and patient's response to treatment.  Ata Pecha 07/16/2011, 12:40 AM

## 2011-07-17 ENCOUNTER — Inpatient Hospital Stay (HOSPITAL_COMMUNITY): Payer: Medicare HMO

## 2011-07-17 DIAGNOSIS — I369 Nonrheumatic tricuspid valve disorder, unspecified: Secondary | ICD-10-CM

## 2011-07-17 LAB — CBC
HCT: 33.7 % — ABNORMAL LOW (ref 39.0–52.0)
Hemoglobin: 11.4 g/dL — ABNORMAL LOW (ref 13.0–17.0)
MCHC: 33.8 g/dL (ref 30.0–36.0)
MCV: 82.2 fL (ref 78.0–100.0)
RDW: 14.9 % (ref 11.5–15.5)

## 2011-07-17 LAB — URINE CULTURE

## 2011-07-17 LAB — BASIC METABOLIC PANEL
BUN: 24 mg/dL — ABNORMAL HIGH (ref 6–23)
Chloride: 107 mEq/L (ref 96–112)
Creatinine, Ser: 1.51 mg/dL — ABNORMAL HIGH (ref 0.50–1.35)
GFR calc Af Amer: 51 mL/min — ABNORMAL LOW (ref >90)
Glucose, Bld: 104 mg/dL — ABNORMAL HIGH (ref 70–99)
Potassium: 3.4 mEq/L — ABNORMAL LOW (ref 3.5–5.1)

## 2011-07-17 LAB — GLUCOSE, CAPILLARY: Glucose-Capillary: 129 mg/dL — ABNORMAL HIGH (ref 70–99)

## 2011-07-17 LAB — CARBOXYHEMOGLOBIN
Carboxyhemoglobin: 1.1 % (ref 0.5–1.5)
Carboxyhemoglobin: 0.9 % (ref 0.5–1.5)
Methemoglobin: 1.1 % (ref 0.0–1.5)
Methemoglobin: 0.9 % (ref 0.0–1.5)
Total hemoglobin: 11.5 g/dL — ABNORMAL LOW (ref 13.5–18.0)

## 2011-07-18 ENCOUNTER — Inpatient Hospital Stay (HOSPITAL_COMMUNITY): Payer: Medicare HMO

## 2011-07-18 ENCOUNTER — Encounter (HOSPITAL_COMMUNITY): Payer: Self-pay | Admitting: Radiology

## 2011-07-18 DIAGNOSIS — R6521 Severe sepsis with septic shock: Secondary | ICD-10-CM

## 2011-07-18 DIAGNOSIS — A419 Sepsis, unspecified organism: Secondary | ICD-10-CM

## 2011-07-18 DIAGNOSIS — N390 Urinary tract infection, site not specified: Secondary | ICD-10-CM

## 2011-07-18 DIAGNOSIS — R6520 Severe sepsis without septic shock: Secondary | ICD-10-CM

## 2011-07-18 DIAGNOSIS — N179 Acute kidney failure, unspecified: Secondary | ICD-10-CM

## 2011-07-18 LAB — APTT: aPTT: 35 seconds (ref 24–37)

## 2011-07-18 LAB — GLUCOSE, CAPILLARY
Glucose-Capillary: 112 mg/dL — ABNORMAL HIGH (ref 70–99)
Glucose-Capillary: 124 mg/dL — ABNORMAL HIGH (ref 70–99)
Glucose-Capillary: 134 mg/dL — ABNORMAL HIGH (ref 70–99)

## 2011-07-18 LAB — BASIC METABOLIC PANEL
BUN: 22 mg/dL (ref 6–23)
Calcium: 8 mg/dL — ABNORMAL LOW (ref 8.4–10.5)
Creatinine, Ser: 1.1 mg/dL (ref 0.50–1.35)
GFR calc Af Amer: 75 mL/min — ABNORMAL LOW (ref >90)
GFR calc non Af Amer: 65 mL/min — ABNORMAL LOW (ref >90)
Glucose, Bld: 121 mg/dL — ABNORMAL HIGH (ref 70–99)

## 2011-07-18 LAB — CBC
HCT: 36.8 % — ABNORMAL LOW (ref 39.0–52.0)
Hemoglobin: 12.6 g/dL — ABNORMAL LOW (ref 13.0–17.0)
MCH: 27.8 pg (ref 26.0–34.0)
MCHC: 34.2 g/dL (ref 30.0–36.0)
MCV: 81.2 fL (ref 78.0–100.0)
RDW: 14.5 % (ref 11.5–15.5)

## 2011-07-18 LAB — URINE CULTURE
Colony Count: NO GROWTH
Culture  Setup Time: 201210032050
Culture: NO GROWTH

## 2011-07-18 LAB — CULTURE, BLOOD (ROUTINE X 2): Culture  Setup Time: 201210022254

## 2011-07-19 LAB — CBC
HCT: 38.5 % — ABNORMAL LOW (ref 39.0–52.0)
MCHC: 33.5 g/dL (ref 30.0–36.0)
Platelets: 119 10*3/uL — ABNORMAL LOW (ref 150–400)
RDW: 14.3 % (ref 11.5–15.5)
WBC: 21.4 10*3/uL — ABNORMAL HIGH (ref 4.0–10.5)

## 2011-07-19 LAB — GLUCOSE, CAPILLARY
Glucose-Capillary: 96 mg/dL (ref 70–99)
Glucose-Capillary: 86 mg/dL (ref 70–99)
Glucose-Capillary: 148 mg/dL — ABNORMAL HIGH (ref 70–99)

## 2011-07-19 LAB — BASIC METABOLIC PANEL
BUN: 19 mg/dL (ref 6–23)
GFR calc Af Amer: 61 mL/min — ABNORMAL LOW (ref >90)
GFR calc non Af Amer: 53 mL/min — ABNORMAL LOW (ref >90)
Potassium: 3 mEq/L — ABNORMAL LOW (ref 3.5–5.1)

## 2011-07-20 ENCOUNTER — Inpatient Hospital Stay (HOSPITAL_COMMUNITY)
Admission: AD | Admit: 2011-07-20 | Discharge: 2011-07-22 | DRG: 694 | Disposition: A | Discharge status: Home / Self Care | Payer: Medicare HMO | Source: Other Acute Inpatient Hospital | Attending: Internal Medicine | Admitting: Internal Medicine

## 2011-07-20 ENCOUNTER — Ambulatory Visit (HOSPITAL_COMMUNITY)
Admission: AD | Admit: 2011-07-20 | Discharge: 2011-07-20 | Disposition: A | Discharge status: Still In Hospital | Payer: Medicare HMO | Source: Other Acute Inpatient Hospital | Attending: Urology | Admitting: Urology

## 2011-07-20 DIAGNOSIS — E785 Hyperlipidemia, unspecified: Secondary | ICD-10-CM | POA: Diagnosis present

## 2011-07-20 DIAGNOSIS — N390 Urinary tract infection, site not specified: Secondary | ICD-10-CM | POA: Diagnosis present

## 2011-07-20 DIAGNOSIS — I10 Essential (primary) hypertension: Secondary | ICD-10-CM | POA: Diagnosis present

## 2011-07-20 DIAGNOSIS — N400 Enlarged prostate without lower urinary tract symptoms: Secondary | ICD-10-CM | POA: Diagnosis present

## 2011-07-20 DIAGNOSIS — E039 Hypothyroidism, unspecified: Secondary | ICD-10-CM | POA: Diagnosis present

## 2011-07-20 DIAGNOSIS — N201 Calculus of ureter: Principal | ICD-10-CM | POA: Diagnosis present

## 2011-07-20 LAB — CBC
MCH: 28.2 pg (ref 26.0–34.0)
MCV: 82.3 fL (ref 78.0–100.0)
Platelets: 132 10*3/uL — ABNORMAL LOW (ref 150–400)
RDW: 14.4 % (ref 11.5–15.5)

## 2011-07-20 LAB — BASIC METABOLIC PANEL
BUN: 18 mg/dL (ref 6–23)
Calcium: 8.6 mg/dL (ref 8.4–10.5)
Creatinine, Ser: 1.2 mg/dL (ref 0.50–1.35)
GFR calc Af Amer: 68 mL/min — ABNORMAL LOW (ref >90)
GFR calc non Af Amer: 59 mL/min — ABNORMAL LOW (ref >90)

## 2011-07-20 LAB — SURGICAL PCR SCREEN: MRSA, PCR: NEGATIVE

## 2011-07-20 LAB — GLUCOSE, CAPILLARY: Glucose-Capillary: 105 mg/dL — ABNORMAL HIGH (ref 70–99)

## 2011-07-21 LAB — GLUCOSE, CAPILLARY
Glucose-Capillary: 120 mg/dL — ABNORMAL HIGH (ref 70–99)
Glucose-Capillary: 118 mg/dL — ABNORMAL HIGH (ref 70–99)
Glucose-Capillary: 136 mg/dL — ABNORMAL HIGH (ref 70–99)

## 2011-07-21 LAB — CBC
HCT: 38.2 % — ABNORMAL LOW (ref 39.0–52.0)
Hemoglobin: 12.9 g/dL — ABNORMAL LOW (ref 13.0–17.0)
MCV: 82.2 fL (ref 78.0–100.0)
Platelets: 138 10*3/uL — ABNORMAL LOW (ref 150–400)
RBC: 4.65 MIL/uL (ref 4.22–5.81)
WBC: 11.2 10*3/uL — ABNORMAL HIGH (ref 4.0–10.5)

## 2011-07-21 LAB — BASIC METABOLIC PANEL
BUN: 17 mg/dL (ref 6–23)
CO2: 29 mEq/L (ref 19–32)
Chloride: 101 mEq/L (ref 96–112)
Creatinine, Ser: 1.26 mg/dL (ref 0.50–1.35)
Glucose, Bld: 132 mg/dL — ABNORMAL HIGH (ref 70–99)

## 2011-07-21 NOTE — Consult Note (Signed)
[name], RAN NO.:  [id number]  MEDICAL RECORD NO.:  [id number]  LOCATION:  5511                         FACILITY:  [facility]  PHYSICIAN:  Bertram Millard. Aysia Lowder, M.D.DATE OF BIRTH:  May 05, 1941  DATE OF CONSULTATION: DATE OF DISCHARGE:                                CONSULTATION   HISTORY:  [name] is a 70 year old male whose only prior urologic history was TURP approximately 10 years ago in [place].  He presented to the Emergency Room at [facility] on July 15, 2011 with urosepsis. The patient needed pressors and fluid challenges.  Because of his persistent critical state, he was transferred to the Intensive Care Unit at [facility].  The patient has grown blood cultures positive for E. coli, and his urinalysis on admission was obviously infected but culture was negative.  The patient has had a clinically improved course, but recent CT of the abdomen and pelvis was performed on July 18, 2011.  This revealed a cystic left kidney, with the cyst appearing normal.  There was right hydronephrosis and a 5-mm right ureterovesical junction stone.  Interestingly, he denies any right flank pain over the past month or two.  He denies any prior history of kidney stones.  His medical history is significant for hypertension, elevated cholesterol, history of BPH with TURP and hypothyroidism.  He is status post median sternotomy, mediastinal exploration, and thyroidectomy in May 2003 at [facility] for mediastinal goiter.  MEDICATIONS PRIOR TO ADMISSION: 1. Cardizem 240 mg daily. 2. Lasix 40 mg daily. 3. KCl 20 mEq daily. 4. Flomax 0.4 mg daily. 5. Synthroid 150 mcg daily. 6. Calcium supplement.  ALLERGIES:  He is allergic to PENICILLIN.  SOCIAL HISTORY:  The patient is married.  He has children.  He lives with his wife.  He is retired.  He denies tobacco or alcohol use.  FAMILY HISTORY:  Significant for CVA  and diabetes.  REVIEW OF SYSTEMS:  Negative except for symptoms of urosepsis.  PHYSICAL EXAMINATION:  GENERAL:  Pleasant elderly male.  He was talkative and appropriate. VITAL SIGNS:  Blood pressure 111/65, most recent temperature was 98.8, heart rate 69, respiratory rate 18. HEENT:  Normal. NECK:  Without mass.  It was supple. RESPIRATORY:  Normal inspiratory and expiratory excursion, no respiratory distress. ABDOMEN:  Flat, soft, nondistended, nontender.  No mass or megaly. Foley catheter was present in the meatus. GU:  Genitals were normal. RECTAL:  Deferred. EXTREMITIES:  Without cyanosis, clubbing, or edema. NEUROLOGIC:  Grossly intact.  LABORATORY DATA:  Laboratories at this time creatinine 1.3, potassium 3.0, and calcium 8.2.  BMET was otherwise normal.  CBC:  White count 21,000, hematocrit 38.5%, and platelet count 119,000.  Urine culture from July 17, 2011 revealed no growth.  Blood cultures from July 15, 2011 revealed E. coli resistant to Cipro, otherwise sensitive to everything.  I reviewed the patient's CT scan.  This shows very large simple left renal cyst, a 5-mm right ureteral stone at the UVJ.  Bladder appeared normal.  There were right greater than left pleural effusions.  The prostate does not appear enlarged.  There seemed to be fiducial markers within the prostate, which may indicate  that the patient has had prostate cancer and subsequent radiotherapy.  There are punctate stones in each kidney.  IMPRESSION: 1. Right distal ureteral stone with urosepsis.  The patient is     currently improved from his critical status at admission.  Blood     cultures were positive, although urine cultures were negative. 2. Left renal cyst, large, asymptomatic. 3. Urinary tract infection with urosepsis, improved.  This may well be     related the patient's right distal ureteral stone. 4. Possible history of prostate cancer.  The patient does have     fiducial markers,  it looks like he may have had external beam     radiotherapy with perhaps inadequate followup.  PLAN: 1. I discussed with the patient the fact that I think we should     proceed with ureteroscopy and right ureteral stone extraction,     perhaps with stent placement.  I think we should try to do that     tomorrow. 2. I think it worthwhile to consider PSA check while the patient is     here, to follow up prostate cancer. 3. I put the patient's case on for 8:30 on Saturday morning at [facility].  We can transfer him back to [facility] following that     procedure.     Bertram Millard. Jeyda Siebel, M.D.     SMD/MEDQ  D:  07/19/2011  T:  07/20/2011  Job:  [id number]  cc:   Milus Mallick. Lodema Hong, M.D.  Electronically Signed by Marcine Matar M.D. on 07/21/2011 10:19:12 AM

## 2011-07-22 LAB — BASIC METABOLIC PANEL
Calcium: 9.1 mg/dL (ref 8.4–10.5)
Chloride: 102 mEq/L (ref 96–112)
Creatinine, Ser: 1.34 mg/dL (ref 0.50–1.35)
GFR calc Af Amer: 59 mL/min — ABNORMAL LOW (ref >90)

## 2011-07-22 LAB — GLUCOSE, CAPILLARY
Glucose-Capillary: 100 mg/dL — ABNORMAL HIGH (ref 70–99)
Glucose-Capillary: 140 mg/dL — ABNORMAL HIGH (ref 70–99)
Glucose-Capillary: 97 mg/dL (ref 70–99)

## 2011-07-22 LAB — CBC
MCH: 27.7 pg (ref 26.0–34.0)
Platelets: 148 10*3/uL — ABNORMAL LOW (ref 150–400)
RBC: 4.83 MIL/uL (ref 4.22–5.81)

## 2011-07-25 NOTE — Discharge Summary (Signed)
[name], [name]           ACCOUNT NO.:  [id number]  MEDICAL RECORD NO.:  [id number]  LOCATION:  5505                         FACILITY:  [facility]  PHYSICIAN:  Manson Passey, MD        DATE OF BIRTH:  [birth year]  DATE OF ADMISSION:  07/16/2011 DATE OF DISCHARGE:  07/22/2011                              DISCHARGE SUMMARY   PRIMARY CARE PHYSICIAN:  Unassigned.  DISCHARGE DIAGNOSES: 1. Right distal ureteral stone with urosepsis. 2. Urinary tract infection with urosepsis. 3. Benign prostatic hypertrophy. 4. Dyslipidemia. 5. Hypertension. 6. Hypothyroidism.  DISCHARGE MEDICATIONS: 1. Norvasc 10 mg daily. 2. Cetirizine 10 mg daily. 3. Hydrochlorothiazide 25 mg daily. 4. Levothyroxine 75 mcg daily. 5. Metformin 500 mg daily with meals. 6. Multivitamin 1 tab daily. 7. Oxybutynin 5 mg daily. 8. Potassium chloride 20 mEq daily. 9. Simvastatin 20 mg every evening.  CONSULTS:  Genitourinary.  DIAGNOSTIC STUDIES: 1. July 16, 2011, chest x-ray shows no pneumothorax and mild     enlargement of cardiac silhouette.  Emphysematous and bronchitic     changing with developing opacities at the lung bases, atelectasis     versus early infiltrate. 2. July 16, 2011, renal ultrasound shows no evidence of renal     obstruction.  Both kidneys demonstrate increased cortical     echogenicity consistent with chronic kidney disease.  Multiple     cysts are present on the left. 3. July 18, 2011, chest x-ray shows stable chest x-ray findings. 4. July 18, 2011, CT chest without contrast shows bilateral pleural     effusions with overlying atelectasis or infiltrates.  No mass     lesions or lymphadenopathy.  There is a 5 mm right UVJ calculus     causing moderate right-sided hydroureter nephrosis.  Multiple     bilateral renal calculi and multiple large left renal cyst.     Cholelithiasis.  Mild diffuse bladder wall thickening.  DISCHARGE LABS:  July 22, 2011, sodium 142, potassium 3.5,  chloride 102, bicarb 30, BUN 15, creatinine 1.34, glucose 107, calcium 9.1. White blood cells 11.5, hemoglobin 13.4 hematocrit 39.8, platelet count 148,000; blood cultures from July 15, 2011, 2 sets grew E. coli; blood cultures from July 21, 2011, 2 sets negative growth to date; PSA 2.06; urine culture July 17, 2011 shows no growth.  HISTORY OF PRESENT ILLNESS:  The patient is a pleasant 70 year old male with history of BPH, TURP approximately 10 years ago in [place], diabetes, dyslipidemia who presented to ED, initially [facility] July 15, 2011 with signs and symptoms consistent with urosepsis at which time the patient required fluid challenges and pressors but because he was refractory to treatment, the patient was transferred to be managed by Intensive Care Unit at [facility]. While in ICU, the patient required pressors as well as continuous fluid challenges.  Once hemodynamically stable, the patient was transferred to hospitalist team for further management.  IMAGING STUDIES:  CAT scan abdomen pelvis showed 5-mm right ureterovesical junction stone for which reason GU was consulted.  PHYSICAL EXAMINATION:  VITAL SIGNS:  Blood pressure 117/80, pulse 89, respiration 18, temperature 98.4 Fahrenheit, oxygen saturation 94% on room air. GENERAL:  No acute distress, the  patient appears comfortable. LUNGS:  Clear to auscultation bilaterally, no wheezing, no rales. CARDIOVASCULAR:  Positive S1, S2, regular rate and rhythm, no murmurs. ABDOMEN:  Positive bowel sounds, soft, nontender/nondistended. EXTREMITIES:  Pulses palpable bilaterally, no lower extremity edema. SKIN:  Warm, dry. NECK:  Supple, no lymphadenopathy. NEUROLOGICAL:  Alert, awake, oriented x3, no focal neurologic deficits.  HOSPITAL COURSE BY PROBLEM: 1. Right distal ureteral stone with urosepsis.  The patient is status     post ureteroscopy and right ureteral stone extraction with stent      placement.  The patient has a good urine output and will follow as     per GU on an outpatient basis with Dr. Retta Diones at phone [phone]     [phone].  Appointment is scheduled for October 16. 2. Urinary tract infection with urosepsis, improved.  The patient has     been afebrile since the admission with white blood cells slowly     trending down to the discharge value of 11.5.  The patient was on     antibiotics throughout the hospitalization on ceftriaxone 2 g     daily, which was adjusted after the sensitivities came back from     blood culture results being sensitive to the ceftriaxone but     resistant to cefepime.  The patient has received antibiotics for 7     days now and can go home without antibiotics.  The blood cultures     from July 21, 2011, 2 sets showed no evidence of growth. 3. Hypertension.  The patient's blood pressure is at goal, 117/80.     The patient can go home on the same medications which is Norvasc 10     mg and hydrochlorothiazide 25 mg. 4. Diabetes.  Blood glucose measurements while in hospital 140, 136,     118, 110.  Well managed with metformin 500 mg daily, so please     continue the same regimen. 5. Hypothyroidism.  Continue taking 75 mcg levothyroxine. 6. Dyslipidemia.  Continue simvastatin 20 mg every evening.  CONDITION AT DISCHARGE:  The patient is clinically well and medically stable to be discharged home today.  TIME SPENT DISCHARGING PATIENT:  More than 35 minutes.          ______________________________ Manson Passey, MD     AD/MEDQ  D:  07/22/2011  T:  07/22/2011  Job:  [id number]  Electronically Signed by Manson Passey MD on 07/25/2011 09:41:17 AM

## 2011-07-25 NOTE — H&P (Signed)
NAMECLEMONS, [name]           ACCOUNT NO.:  [id number]  MEDICAL RECORD NO.:  [id number]  LOCATION:  5511                         FACILITY:  [facility]  PHYSICIAN:  Manson Passey, MD        DATE OF BIRTH:  1940/12/20  DATE OF ADMISSION:  07/16/2011 DATE OF DISCHARGE:                             HISTORY & PHYSICAL   CHIEF COMPLAINT:  Sepsis.  HISTORY OF PRESENT ILLNESS:  The patient is a pleasant 70 year old male with history of hypertension, dyslipidemia, BPH, hypothyroidism who initially presented to [facility] on July 15, 2011, with signs and symptoms consistent with sepsis/urosepsis.  The patient was on pressors and required additional fluid challenges, however, due to failed treatment, the patient was transferred to [facility] under Pulmonary Critical Care Medicine Team.  In ICU, the patient continued to require pressors but eventually was tapered off when he was hemodynamically stable.  The patient is now transferred to Hospitalist Team for further management.  At present, the patient feels very well. No complaints of nausea or vomiting or abdominal pain.  No complaints of chest pain or cough.  No dysuria or hematuria.  No fever or chills.  REVIEW OF SYSTEMS:  As per HPI.  PAST MEDICAL HISTORY: 1. Hypertension. 2. Dyslipidemia. 3. BPH. 4. Hypothyroidism.  PAST SURGICAL HISTORY:  Status post median sternotomy, exploration of the mediastinum, and total thyroidectomy secondary to large mediastinal goiter (Mar 01, 2002) at [facility].  ALLERGIES:  PENICILLIN.  FAMILY HISTORY:  Significant for mother who died of cerebral hemorrhage at the age of 44 years.  Father died in his 26s secondary to complications of diabetes mellitus.  SOCIAL HISTORY:  The patient denies alcohol, tobacco, illegal drug use.  HOME MEDICATIONS: 1. Cardizem 240 mg daily. 2. Lasix 40 mg daily. 3. Potassium chloride 20 mEq daily. 4. Flomax 0.4 mg daily. 5. Synthroid  150 mcg daily. 6. Calcium 4-5 tablets daily in the form of Tums.  PHYSICAL EXAMINATION:  VITAL SIGNS:  Blood pressure 112/82, pulse was 79, respirations 15, oxygen saturation 100% on room air. GENERAL APPEARANCE:  No acute distress, the patient appears comfortable. LUNGS:  Clear to auscultation bilaterally.  No wheezes, no rales, no rhonchi. CARDIOVASCULAR:  Positive S1-S2, regular rate and rhythm.  No murmurs appreciated. ABDOMEN:  Positive bowel sounds, soft, nontender/nondistended. NEUROLOGIC:  Alert, awake, oriented x3.  No focal neurologic deficits. EXTREMITY:  No lower extremity edema, pulses palpable bilaterally. SKIN:  Warm, dry. NECK:  Supple, no lymphadenopathy.  LABORATORY:  July 18, 2011, sodium 141, potassium 3.0, chloride 109, bicarb 27, BUN 22, creatinine 1.10, glucose 121, calcium 8, albumin 2.6. White blood cells 49.1, hemoglobin 12.6, hematocrit 36.8, platelet count 106.  Blood cultures two sets July 15, 2011, grew E-coli resistant to Ciprofloxacin only.  Urine culture July 15, 2011, shows no growth to date.  DIAGNOSTIC STUDIES:  CT chest without contrast July 18, 2011 - pending.  CT abdomen and pelvis without contrast July 18, 2011 - pending.  Chest x-ray July 18, 2011 - stable.  Persistent bilateral lower lobe airspace disease and central vascular congestion.  No pneumothorax.  ASSESSMENT AND PLAN: 1. Cardiovascular:  The patient is at present hemodynamically stable,  off pressors.  Continue to monitor blood pressure.  At present,     blood pressure is 113/68. 2. Infectious Disease/Escherichia coli bacteremia.  Blood culture     sensitivity resistant to ciprofloxacin only.  Please continue     Maxipime q. 24 hours and start ceftriaxone 1 g q. 12 IV.  We will     discontinue ciprofloxacin.  Followup CT abdomen and pelvis without     contrast to evaluate for possible prostate abscess or prostatitis.     The patient had 2-D echo July 17, 2011,  which showed systolic     function moderately to severely reduced with estimated ejection     fraction in 30%.  Diffuse hypokinesis.  Pulmonary artery pressure     36-40 mmHg.  No valvular vegetation seen. 3. Respiratory.  Follow up CT chest to rule out the possibility of     pneumonia.  We will taper Solu-Cortef to oral steroids.  The     patient is saturating well on ambient air at present. 4. Endocrine.  The patient has history of hypothyroidism.  Continue     levothyroxine 75 mcg daily. 5. Diet - heart healthy. 6. Education - the patient and family are aware of the plan of care     and treatment.          ______________________________ Manson Passey, MD     AD/MEDQ  D:  07/18/2011  T:  07/19/2011  Job:  [id number]  Electronically Signed by Manson Passey MD on 07/25/2011 09:41:14 AM

## 2011-07-27 LAB — CULTURE, BLOOD (ROUTINE X 2): Culture  Setup Time: 201210071805

## 2011-08-03 LAB — HEMOGLOBIN A1C: Hgb A1c MFr Bld: 6.1 % — ABNORMAL HIGH (ref <5.7)

## 2011-08-03 LAB — LIPID PANEL
HDL: 52 mg/dL (ref >39)
LDL Cholesterol: 128 mg/dL — ABNORMAL HIGH (ref 0–99)
Triglycerides: 107 mg/dL (ref <150)
VLDL: 21 mg/dL (ref 0–40)

## 2011-08-03 LAB — BASIC METABOLIC PANEL
BUN: 16 mg/dL (ref 6–23)
CO2: 28 mEq/L (ref 19–32)
Chloride: 100 mEq/L (ref 96–112)
Creat: 1.16 mg/dL (ref 0.50–1.35)
Sodium: 139 mEq/L (ref 135–145)

## 2011-08-03 LAB — CBC WITH DIFFERENTIAL/PLATELET
Basophils Absolute: 0.1 10*3/uL (ref 0.0–0.1)
HCT: 40.1 % (ref 39.0–52.0)
Hemoglobin: 13.1 g/dL (ref 13.0–17.0)
Lymphs Abs: 1.1 10*3/uL (ref 0.7–4.0)
MCH: 27.9 pg (ref 26.0–34.0)
MCHC: 32.7 g/dL (ref 30.0–36.0)
RDW: 14.6 % (ref 11.5–15.5)

## 2011-08-06 ENCOUNTER — Encounter: Payer: Self-pay | Admitting: Family Medicine

## 2011-08-06 NOTE — Op Note (Addendum)
[name], [name] NO.:  [id number]  MEDICAL RECORD NO.:  [id number]  LOCATION:  5505                         FACILITY:  [facility]  PHYSICIAN:  Bertram Millard. Seleta Hovland, M.D.DATE OF BIRTH:  [birth year]  DATE OF PROCEDURE:  07/20/2011 DATE OF DISCHARGE:                              OPERATIVE REPORT   PREOPERATIVE DIAGNOSIS:  Obstructing right distal ureteral stone, 5 mm, with history of sepsis and hydronephrosis.  POSTOPERATIVE DIAGNOSIS:  Obstructing right distal ureteral stone, 5 mm, with history of sepsis and hydronephrosis.  BRIEF HISTORY:  This is a 70 year old male who was transferred to the Intensive Care Unit at [facility] earlier this week from [place].  The patient had urosepsis.  He was recently found to have a right distal ureteral stone and mild hydro.  Because of the patient's resolving sepsis/urinary tract infection, and his obstructing right ureteral calculus, it was recommended that he will be taken to the operating room urgently for decompression of his kidney plus possible stone _extraction_.  I discussed the procedure with the patient as well as his wife.  He was transferred from [facility] to [place] on July 20, 2011, for this procedure.  DESCRIPTION OF PROCEDURE:  The patient has been on IV Rocephin.  He was appropriately marked and identified in the holding area.  He was taken to the operating room where general anesthetic was administered using the LMA.  He was placed in the dorsal lithotomy position.  Genitalia and perineum were prepped and draped.  Time-out was then performed.  The patient then commenced.  A 22-French panendoscope was advanced under direct vision through his urethra.  Prostate was nonobstructive and indicative per prior resection.  There was some mild nodular regrowth on the left.  There was erythematous mucosa at the bladder base and within the prostatic urethra consistent with prior  radiation.  It was very difficult to identify the ureteral orifices due to mild distortion at the base of the bladder as well as his erythema.  No lesions were seen within his bladder.  The rest of the bladder mucosa was pale, with mild trabeculations noted.  The left ureteral orifice was eventually identified, and this was only done after I thought that it was a right orifice and then a Glidewire had been advanced.  Eventually, after using the 70-degree lens and a flexible ureteroscope, I used Albarran bridge on the 70-degree lens to eventually identify the right ureteral orifice.  More than likely, a stone in the distal ureter was causing distortion of the orifice as well, and this was located on the superior edge of the interureteric ridge.  Using the bridge, an open- ended catheter and a Glidewire, I was eventually able to negotiate the Glidewire up into the right ureteral orifice.  The Glidewire was advanced all the way to the renal pelvis using fluoroscopic guidance.  I then removed the open-ended catheter, advanced a ureteral access sheath into the distal ureter.  This dilated the orifice.  I then replaced a Cook guidewire for the Glidewire, and then tried to advance a 6-French rigid ureteroscope into the right distal ureter.  This was unsuccessful, basically because of the angle of the ureter as  well as the fibrosis of the ureteral orifice.  I did attempt to do this for elongated period of time, but eventually, after attempts were met with resistance, placed a 24 cm x 6-French Contour stent in the right ureter using fluoroscopic and cystoscopic guidance.  Good proximal distal curls were seen after I removed the guidewire.  The bladder was then drained.  The procedure was then terminated.  The patient tolerated the procedure well.  He was awakened and taken to PACU in stable condition.  He will be transferred to [facility] to his regular bed.     Bertram Millard. Sagrario Lineberry,  M.D.     SMD/MEDQ  D:  07/21/2011  T:  07/21/2011  Job:  [id number]  cc:   Milus Mallick. Lodema Hong, M.D. Fax: [phone]  Dr. __________  Electronically Signed by Marcine Matar M.D. on 08/06/2011 06:00:16 PM

## 2011-08-07 ENCOUNTER — Ambulatory Visit (INDEPENDENT_AMBULATORY_CARE_PROVIDER_SITE_OTHER): Payer: Medicare HMO | Admitting: Family Medicine

## 2011-08-07 ENCOUNTER — Encounter: Payer: Self-pay | Admitting: Family Medicine

## 2011-08-07 VITALS — BP 110/80 | HR 91 | Resp 16 | Ht 66.5 in | Wt 154.1 lb

## 2011-08-07 DIAGNOSIS — E039 Hypothyroidism, unspecified: Secondary | ICD-10-CM

## 2011-08-07 DIAGNOSIS — E785 Hyperlipidemia, unspecified: Secondary | ICD-10-CM

## 2011-08-07 DIAGNOSIS — I10 Essential (primary) hypertension: Secondary | ICD-10-CM

## 2011-08-07 DIAGNOSIS — E119 Type 2 diabetes mellitus without complications: Secondary | ICD-10-CM

## 2011-08-07 DIAGNOSIS — R5383 Other fatigue: Secondary | ICD-10-CM

## 2011-08-07 MED ORDER — OXYBUTYNIN CHLORIDE 5 MG PO TABS: ORAL_TABLET | ORAL | Status: DC

## 2011-08-07 MED ORDER — LEVOTHYROXINE SODIUM 75 MCG PO TABS: 75.0000 ug | ORAL_TABLET | Freq: Every day | ORAL | Status: DC

## 2011-08-07 MED ORDER — LOVASTATIN 40 MG PO TABS: 40.0000 mg | ORAL_TABLET | Freq: Every day | ORAL | Status: DC

## 2011-08-07 NOTE — Patient Instructions (Signed)
/  u in 4 months.  Fasting labs and urine in 4 months LABWORK  NEEDS TO BE DONE BETWEEN 3 TO 7 DAYS BEFORE YOUR NEXT SCEDULED  VISIT.  THIS WILL IMPROVE THE QUALITY OF YOUR CARE.   I am thankful you are better. New is lovastatin 40mg  one at bedtime for your cholesterol   Blood sugar abd thyroid lab work and blood pressure are all excellent

## 2011-08-13 ENCOUNTER — Other Ambulatory Visit: Payer: Self-pay | Admitting: Family Medicine

## 2011-08-14 ENCOUNTER — Ambulatory Visit (HOSPITAL_BASED_OUTPATIENT_CLINIC_OR_DEPARTMENT_OTHER)
Admission: RE | Admit: 2011-08-14 | Discharge: 2011-08-14 | Disposition: A | Discharge status: Home / Self Care | Payer: Medicare HMO | Source: Ambulatory Visit | Attending: Urology | Admitting: Urology

## 2011-08-14 DIAGNOSIS — Z8546 Personal history of malignant neoplasm of prostate: Secondary | ICD-10-CM | POA: Insufficient documentation

## 2011-08-14 DIAGNOSIS — Z01812 Encounter for preprocedural laboratory examination: Secondary | ICD-10-CM | POA: Insufficient documentation

## 2011-08-14 DIAGNOSIS — Z79899 Other long term (current) drug therapy: Secondary | ICD-10-CM | POA: Insufficient documentation

## 2011-08-14 DIAGNOSIS — N201 Calculus of ureter: Secondary | ICD-10-CM | POA: Insufficient documentation

## 2011-08-14 DIAGNOSIS — I10 Essential (primary) hypertension: Secondary | ICD-10-CM | POA: Insufficient documentation

## 2011-08-14 DIAGNOSIS — N1330 Unspecified hydronephrosis: Secondary | ICD-10-CM | POA: Insufficient documentation

## 2011-08-14 LAB — POCT I-STAT, CHEM 8
Creatinine, Ser: 1.2 mg/dL (ref 0.50–1.35)
Glucose, Bld: 102 mg/dL — ABNORMAL HIGH (ref 70–99)
HCT: 41 % (ref 39.0–52.0)
Hemoglobin: 13.9 g/dL (ref 13.0–17.0)
Potassium: 3.7 mEq/L (ref 3.5–5.1)
Sodium: 144 mEq/L (ref 135–145)
TCO2: 28 mmol/L (ref 0–100)

## 2011-08-15 HISTORY — PX: OTHER SURGICAL HISTORY: SHX169

## 2011-08-16 MED ORDER — HYDROCHLOROTHIAZIDE 12.5 MG PO CAPS: 12.5000 mg | ORAL_CAPSULE | Freq: Every day | ORAL | Status: DC

## 2011-08-16 MED ORDER — AMLODIPINE BESYLATE 10 MG PO TABS: 10.0000 mg | ORAL_TABLET | Freq: Every day | ORAL | Status: DC

## 2011-08-16 NOTE — Telephone Encounter (Signed)
Sent in

## 2011-08-18 NOTE — Op Note (Signed)
[name], [name] NO.:  [id number]  MEDICAL RECORD NO.:  [id number]  LOCATION:                                 FACILITY:  PHYSICIAN:  [name], M.D.DATE OF BIRTH:  [birth year]  DATE OF PROCEDURE:  08/14/2011 DATE OF DISCHARGE:                              OPERATIVE REPORT   PREOPERATIVE DIAGNOSIS:  History of right ureteral stone with urosepsis and hydronephrosis.  POSTOPERATIVE DIAGNOSIS:  History of right ureteral stone with urosepsis and hydronephrosis, with ureteral stone passing spontaneously.  PRINCIPLE PROCEDURES:  Cystoscopy, right double-J stent extraction, right ureteroscopy - flexible.  SURGEON:  Paul Millard. Antjuan Rothe, MD  ANESTHESIA:  General with LMA.  COMPLICATIONS:  None.  BRIEF HISTORY:  [name] is a 70 year old male with a prior history of prostate cancer.  I have consulted on him in early October, when he was admitted to the hospital with urosepsis.  He spent time in the intensive care unit.  At the time of hospitalization, he was noted to have hydronephrosis on the right with a right distal ureteral stone which was fairly decent size.  He underwent urgent cystoscopy and double- J stent placement.  He presents at this time for ureteroscopy to extract the stone.  Risks and complications were discussed with the patient during recent hospitalization on October 18.  These include, but are not limited to recurring infection, bleeding, injury to the ureter, among others.  He accepts these and desires to proceed.  The patient does state that recently he passed a dark object which he cannot tell me was soft or hard.  This was the same time of his significant hematuria which has since cleared.  DESCRIPTION OF PROCEDURE:  The patient was identified in the holding area and the surgical site correctly marked.  He received preoperative IV antibiotics.  He was taken to the operating room where general anesthetic was  administered with LMA.  He was placed in the dorsal lithotomy position.  Genitalia and perineum were prepped and draped. Time-out was then performed.  At this point, a 22-French panendoscope was placed through his urethra. Urethra was normal and prostate was nonobstructive.  His bladder was entered and inspected circumferentially.  There was mild erythema around the right ureteral orifice where the stent was adequately seen curled up.  No bladder lesions were seen.  The left ureteral orifice was normal.  There was no significant air erythema or radiation effects.  The stent was grasped, and the distal end extracted just to the urethral meatus.  At this point, a guidewire was placed through the stent and up into the right renal pelvis using fluoroscopic guidance.  The stent was then removed over top of the guidewire.  I then tried to pass a 6-French rigid ureteroscope.  I was unable to get this into the right ureteral orifice.  At this point, I dilated the distal ureter with the ureteral access sheath and left the access sheath indwelling.  I then placed a digital ureteroscope (flexible) up through the sheath and up into the right renal pelvis using fluoroscopic guidance.  The entire right pyelocaliceal system was inspected.  There had been some punctate calcifications on the patient's prior CT  scan.  I was unable to see any stones within the pyelocaliceal system which appeared slightly dilated. Pictures were taken.  There were no lesions seen.  I then removed the scope slowly through the ureter.  At this point, no stones were seen. With a normal cystoscopy, it was felt that the patient most likely passed the stone between August 01, 2011, and today.  The ureter had been adequately dilated, with the stent being indwelling for approximately 4 weeks.  I decided not to replace the stent.  After total inspection of the ureter and renal pelvis was completed, I removed the ureteroscope,  drained the bladder, and then removed the scope.  The patient was then awakened and taken to PACU in stable condition.  I will send the patient home on 3 days of Cipro 500 mg p.o. q.12 hours, as well as hydrocodone.  He will follow up in my office in [place] on September 03, 2011, at 10:45 a.m.     [name], M.D.     SMD/MEDQ  D:  08/14/2011  T:  08/14/2011  Job:  [id number]  cc:   Milus Mallick. Lodema Hong, M.D. Fax: [phone]  Electronically Signed by Marcine Matar M.D. on 08/18/2011 05:34:41 AM

## 2011-08-21 ENCOUNTER — Telehealth: Payer: Self-pay | Admitting: Family Medicine

## 2011-08-21 MED ORDER — AMLODIPINE BESYLATE 10 MG PO TABS: 10.0000 mg | ORAL_TABLET | Freq: Every day | ORAL | Status: DC

## 2011-08-21 MED ORDER — HYDROCHLOROTHIAZIDE 12.5 MG PO CAPS: 12.5000 mg | ORAL_CAPSULE | Freq: Every day | ORAL | Status: DC

## 2011-08-21 NOTE — Telephone Encounter (Signed)
Sent one week to local pharmacy

## 2011-09-03 ENCOUNTER — Ambulatory Visit (INDEPENDENT_AMBULATORY_CARE_PROVIDER_SITE_OTHER): Payer: Medicare HMO | Admitting: Urology

## 2011-09-03 DIAGNOSIS — N529 Male erectile dysfunction, unspecified: Secondary | ICD-10-CM

## 2011-09-03 DIAGNOSIS — C61 Malignant neoplasm of prostate: Secondary | ICD-10-CM

## 2011-09-03 DIAGNOSIS — N201 Calculus of ureter: Secondary | ICD-10-CM

## 2011-09-06 NOTE — Progress Notes (Signed)
  Subjective:    Patient ID: [name], male    DOB: 1940-11-08, 70 y.o.   MRN: [id number]  HPI Pt in for f/u. He was hospitalized with urosepsis from 10/02 to 10/08/2012At that time he had  A kidney stone the first ever.He is feeling much now , though still somewhat weak, and apetiie gradually improving but not back to normal as yet.He denies any recent fever or chills. He denies blood in the urine or dysuria   Review of Systems See HPI Denies recent fever or chills. Denies sinus pressure, nasal congestion, ear pain or sore throat. Denies chest congestion, productive cough or wheezing. Denies chest pains, palpitations and leg swelling Denies abdominal pain, nausea, vomiting,diarrhea or constipation.   Denies dysuria, frequency, hesitancy or incontinence. C/o mild  joint pain, and limitation in mobility. Denies headaches, seizures, numbness, or tingling. Denies depression, anxiety or insomnia. Denies skin break down or rash.        Objective:   Physical Exam Patient alert and oriented and in no cardiopulmonary distress.  HEENT: No facial asymmetry, EOMI, no sinus tenderness,  oropharynx pink and moist.  Neck supple no adenopathy.  Chest: Clear to auscultation bilaterally.  CVS: S1, S2 no murmurs, no S3.  ABD: Soft non tender. Bowel sounds normal.  Ext: No edema  MS: Adequate ROM spine, shoulders, hips and knees.  Skin: Intact, no ulcerations or rash noted.  Psych: Good eye contact, normal affect. Memory intact not anxious or depressed appearing.  CNS: CN 2-12 intact, power, tone and sensation normal throughout.        Assessment & Plan:

## 2011-09-06 NOTE — Assessment & Plan Note (Signed)
Controlled, no change in medication  

## 2011-09-06 NOTE — Assessment & Plan Note (Addendum)
Uncontrolled, low fat diet discussed and encouraged, lovastatin dose increased

## 2011-09-26 ENCOUNTER — Other Ambulatory Visit: Payer: Self-pay

## 2011-09-26 MED ORDER — POTASSIUM CHLORIDE CRYS ER 20 MEQ PO TBCR: 20.0000 meq | EXTENDED_RELEASE_TABLET | Freq: Every day | ORAL | Status: DC

## 2011-10-03 ENCOUNTER — Telehealth: Payer: Self-pay | Admitting: Family Medicine

## 2011-10-03 MED ORDER — POTASSIUM CHLORIDE CRYS ER 20 MEQ PO TBCR: 20.0000 meq | EXTENDED_RELEASE_TABLET | Freq: Every day | ORAL | Status: DC

## 2011-10-03 NOTE — Telephone Encounter (Signed)
7 pills sent to [facility]

## 2011-10-03 NOTE — Telephone Encounter (Signed)
Sent in 7 pills to [facility]

## 2011-10-04 ENCOUNTER — Other Ambulatory Visit: Payer: Self-pay | Admitting: Family Medicine

## 2011-11-11 ENCOUNTER — Other Ambulatory Visit: Payer: Self-pay

## 2011-11-11 ENCOUNTER — Telehealth: Payer: Self-pay | Admitting: Family Medicine

## 2011-11-11 DIAGNOSIS — E785 Hyperlipidemia, unspecified: Secondary | ICD-10-CM

## 2011-11-11 MED ORDER — HYDROCHLOROTHIAZIDE 12.5 MG PO CAPS: 12.5000 mg | ORAL_CAPSULE | Freq: Every day | ORAL | Status: DC

## 2011-11-11 MED ORDER — AMLODIPINE BESYLATE 10 MG PO TABS: 10.0000 mg | ORAL_TABLET | Freq: Every day | ORAL | Status: DC

## 2011-11-11 MED ORDER — LOVASTATIN 40 MG PO TABS: 40.0000 mg | ORAL_TABLET | Freq: Every day | ORAL | Status: DC

## 2011-11-11 MED ORDER — LEVOTHYROXINE SODIUM 75 MCG PO TABS: 75.0000 ug | ORAL_TABLET | Freq: Every day | ORAL | Status: DC

## 2011-11-11 MED ORDER — POTASSIUM CHLORIDE CRYS ER 20 MEQ PO TBCR: 20.0000 meq | EXTENDED_RELEASE_TABLET | Freq: Every day | ORAL | Status: DC

## 2011-11-11 MED ORDER — OXYBUTYNIN CHLORIDE 5 MG PO TABS: ORAL_TABLET | ORAL | Status: DC

## 2011-11-11 MED ORDER — METFORMIN HCL 500 MG PO TABS: 500.0000 mg | ORAL_TABLET | Freq: Every day | ORAL | Status: DC

## 2011-11-12 NOTE — Telephone Encounter (Signed)
meds sent

## 2011-12-11 ENCOUNTER — Ambulatory Visit (INDEPENDENT_AMBULATORY_CARE_PROVIDER_SITE_OTHER): Payer: Medicare Other | Admitting: Family Medicine

## 2011-12-11 DIAGNOSIS — I10 Essential (primary) hypertension: Secondary | ICD-10-CM

## 2011-12-11 DIAGNOSIS — E119 Type 2 diabetes mellitus without complications: Secondary | ICD-10-CM

## 2011-12-11 DIAGNOSIS — J301 Allergic rhinitis due to pollen: Secondary | ICD-10-CM

## 2011-12-11 DIAGNOSIS — E785 Hyperlipidemia, unspecified: Secondary | ICD-10-CM

## 2011-12-11 DIAGNOSIS — R5381 Other malaise: Secondary | ICD-10-CM

## 2011-12-11 DIAGNOSIS — E039 Hypothyroidism, unspecified: Secondary | ICD-10-CM

## 2011-12-11 MED ORDER — LEVOTHYROXINE SODIUM 75 MCG PO TABS: 75.0000 ug | ORAL_TABLET | Freq: Every day | ORAL | Status: DC

## 2011-12-11 MED ORDER — AMLODIPINE BESYLATE 10 MG PO TABS: 10.0000 mg | ORAL_TABLET | Freq: Every day | ORAL | Status: DC

## 2011-12-11 MED ORDER — HYDROCHLOROTHIAZIDE 12.5 MG PO CAPS: 12.5000 mg | ORAL_CAPSULE | Freq: Every day | ORAL | Status: DC

## 2011-12-11 MED ORDER — OXYBUTYNIN CHLORIDE 5 MG PO TABS: ORAL_TABLET | ORAL | Status: DC

## 2011-12-11 NOTE — Patient Instructions (Addendum)
F/u in 5.5 month  Please stop metformin once you finish what you have, your diabetes can be managed by healthy food choices, regular exercise and maintaining a healthy weight.  Test supplies will not be paid for when you are on no medication, and you really do not need to test anymore. I will do blood tests every 5.5 to 6 month  Fasting lipid, cmp and HBA1C and tSH as soon as possible  Hba1C in 5.5 month

## 2011-12-12 LAB — HEMOGLOBIN A1C: Mean Plasma Glucose: 114 mg/dL (ref <117)

## 2011-12-12 LAB — LIPID PANEL
Cholesterol: 151 mg/dL (ref 0–200)
HDL: 67 mg/dL (ref >39)
Total CHOL/HDL Ratio: 2.3 Ratio
Triglycerides: 65 mg/dL (ref <150)
VLDL: 13 mg/dL (ref 0–40)

## 2011-12-12 LAB — COMPLETE METABOLIC PANEL WITH GFR
AST: 18 U/L (ref 0–37)
Alkaline Phosphatase: 60 U/L (ref 39–117)
BUN: 13 mg/dL (ref 6–23)
GFR, Est Non African American: 55 mL/min — ABNORMAL LOW
Glucose, Bld: 103 mg/dL — ABNORMAL HIGH (ref 70–99)
Potassium: 4.2 mEq/L (ref 3.5–5.3)
Total Bilirubin: 0.7 mg/dL (ref 0.3–1.2)

## 2011-12-13 ENCOUNTER — Encounter: Payer: Self-pay | Admitting: Family Medicine

## 2011-12-13 NOTE — Assessment & Plan Note (Signed)
Controlled, no change in medication  

## 2011-12-13 NOTE — Progress Notes (Signed)
  Subjective:    Patient ID: [name], male    DOB: 09/19/1941, 70 y.o.   MRN: [id number]  HPI The PT is here for follow up and re-evaluation of chronic medical conditions, medication management and review of any available recent lab and radiology data.  Preventive health is updated, specifically  Cancer screening and Immunization.   Questions or concerns regarding consultations or procedures which the PT has had in the interim are  addressed. The PT denies any adverse reactions to current medications since the last visit.  There are no new concerns.  There are no specific complaints  Pt has been testing blood sugar and taking medication for diabetes. Numbers are excellent , with fasting sugars seldom over 110. I discussed at length th fact that he no longer needs med for dM or to test at home, just lifestyle modification is acceptable for his control, and will closely f/u on labwork to verify this. He denies cold or heat intolerance     Review of Systems See HPI Denies recent fever or chills. Denies sinus pressure, nasal congestion, ear pain or sore throat. Denies chest congestion, productive cough or wheezing. Denies chest pains, palpitations and leg swelling Denies abdominal pain, nausea, vomiting,diarrhea or constipation.   Denies dysuria, frequency, hesitancy or incontinence. Denies joint pain, swelling and limitation in mobility. Denies headaches, seizures, numbness, or tingling. Denies depression, anxiety or insomnia. Denies skin break down or rash.        Objective:   Physical Exam Patient alert and oriented and in no cardiopulmonary distress.  HEENT: No facial asymmetry, EOMI, no sinus tenderness,  oropharynx pink and moist.  Neck supple no adenopathy.  Chest: Clear to auscultation bilaterally.  CVS: S1, S2 no murmurs, no S3.  ABD: Soft non tender. Bowel sounds normal.  Ext: No edema  MS: Adequate ROM spine, shoulders, hips and knees.  Skin: Intact,  no ulcerations or rash noted.  Psych: Good eye contact, normal affect. Memory intact not anxious or depressed appearing.  CNS: CN 2-12 intact, power, tone and sensation normal throughout.        Assessment & Plan:

## 2011-12-23 ENCOUNTER — Telehealth: Payer: Self-pay | Admitting: Family Medicine

## 2011-12-24 NOTE — Telephone Encounter (Signed)
This is ok, pls sched a new pt visit when open

## 2011-12-31 ENCOUNTER — Other Ambulatory Visit: Payer: Self-pay

## 2011-12-31 ENCOUNTER — Telehealth: Payer: Self-pay | Admitting: Family Medicine

## 2011-12-31 MED ORDER — POTASSIUM CHLORIDE CRYS ER 20 MEQ PO TBCR: 20.0000 meq | EXTENDED_RELEASE_TABLET | Freq: Every day | ORAL | Status: DC

## 2011-12-31 NOTE — Telephone Encounter (Signed)
Refill sent.

## 2012-01-16 ENCOUNTER — Telehealth: Payer: Self-pay | Admitting: Family Medicine

## 2012-01-17 ENCOUNTER — Other Ambulatory Visit: Payer: Self-pay

## 2012-01-17 MED ORDER — POTASSIUM CHLORIDE CRYS ER 20 MEQ PO TBCR: 20.0000 meq | EXTENDED_RELEASE_TABLET | Freq: Every day | ORAL | Status: DC

## 2012-01-17 NOTE — Telephone Encounter (Signed)
Med sent in.

## 2012-02-10 ENCOUNTER — Telehealth: Payer: Self-pay | Admitting: Family Medicine

## 2012-02-11 ENCOUNTER — Telehealth: Payer: Self-pay | Admitting: Family Medicine

## 2012-02-13 ENCOUNTER — Other Ambulatory Visit: Payer: Self-pay

## 2012-02-13 DIAGNOSIS — E785 Hyperlipidemia, unspecified: Secondary | ICD-10-CM

## 2012-02-13 MED ORDER — LEVOTHYROXINE SODIUM 75 MCG PO TABS: 75.0000 ug | ORAL_TABLET | Freq: Every day | ORAL | Status: DC

## 2012-02-13 MED ORDER — OXYBUTYNIN CHLORIDE 5 MG PO TABS: ORAL_TABLET | ORAL | Status: DC

## 2012-02-13 MED ORDER — HYDROCHLOROTHIAZIDE 12.5 MG PO CAPS: 12.5000 mg | ORAL_CAPSULE | Freq: Every day | ORAL | Status: DC

## 2012-02-13 MED ORDER — AMLODIPINE BESYLATE 10 MG PO TABS: 10.0000 mg | ORAL_TABLET | Freq: Every day | ORAL | Status: DC

## 2012-02-13 MED ORDER — LOVASTATIN 40 MG PO TABS: 40.0000 mg | ORAL_TABLET | Freq: Every day | ORAL | Status: DC

## 2012-02-14 NOTE — Telephone Encounter (Signed)
I told the patient I sent his meds in before but I sent them in again yesterday

## 2012-02-14 NOTE — Telephone Encounter (Signed)
meds sent

## 2012-02-21 ENCOUNTER — Other Ambulatory Visit: Payer: Self-pay

## 2012-02-21 MED ORDER — POTASSIUM CHLORIDE CRYS ER 20 MEQ PO TBCR: 20.0000 meq | EXTENDED_RELEASE_TABLET | Freq: Every day | ORAL | Status: DC

## 2012-05-14 ENCOUNTER — Ambulatory Visit (INDEPENDENT_AMBULATORY_CARE_PROVIDER_SITE_OTHER): Payer: Medicare Other | Admitting: Family Medicine

## 2012-05-14 ENCOUNTER — Encounter: Payer: Self-pay | Admitting: Family Medicine

## 2012-05-14 VITALS — BP 120/90 | HR 100 | Resp 15 | Ht 66.5 in | Wt 159.0 lb

## 2012-05-14 DIAGNOSIS — M25559 Pain in unspecified hip: Secondary | ICD-10-CM

## 2012-05-14 DIAGNOSIS — E785 Hyperlipidemia, unspecified: Secondary | ICD-10-CM

## 2012-05-14 DIAGNOSIS — I10 Essential (primary) hypertension: Secondary | ICD-10-CM

## 2012-05-14 DIAGNOSIS — M25552 Pain in left hip: Secondary | ICD-10-CM

## 2012-05-14 DIAGNOSIS — R5381 Other malaise: Secondary | ICD-10-CM

## 2012-05-14 DIAGNOSIS — R7309 Other abnormal glucose: Secondary | ICD-10-CM

## 2012-05-14 DIAGNOSIS — E039 Hypothyroidism, unspecified: Secondary | ICD-10-CM

## 2012-05-14 DIAGNOSIS — R5383 Other fatigue: Secondary | ICD-10-CM

## 2012-05-14 MED ORDER — IBUPROFEN 800 MG PO TABS: 800.0000 mg | ORAL_TABLET | Freq: Two times a day (BID) | ORAL | Status: DC

## 2012-05-14 MED ORDER — METHYLPREDNISOLONE ACETATE 80 MG/ML IJ SUSP
80.0000 mg | Freq: Once | INTRAMUSCULAR | Status: AC
  Administered 2012-05-14: 80 mg via INTRAMUSCULAR

## 2012-05-14 MED ORDER — KETOROLAC TROMETHAMINE 60 MG/2ML IM SOLN
60.0000 mg | Freq: Once | INTRAMUSCULAR | Status: AC
  Administered 2012-05-14: 60 mg via INTRAMUSCULAR

## 2012-05-14 MED ORDER — PREDNISONE (PAK) 5 MG PO TABS: 5.0000 mg | ORAL_TABLET | ORAL | Status: DC

## 2012-05-14 NOTE — Progress Notes (Signed)
  Subjective:    Patient ID: [name], male    DOB: May 04, 1941, 71 y.o.   MRN: [id number]  HPI Pt injured left hip 10 years ago, building fell on him, now has 1 week h/o pain radiating to mid thigh, he is limping, denies lower extremity weakness or numbness, no incontinence of stool or urine Pain occurs with weight bearing does not keep him up at night. Otherwise he reports feeling weill , with no other concerns   Review of Systems See HPI Denies recent fever or chills. Denies sinus pressure, nasal congestion, ear pain or sore throat. Denies chest congestion, productive cough or wheezing. Denies chest pains, palpitations and leg swelling Denies abdominal pain, nausea, vomiting,diarrhea or constipation.   Denies dysuria, frequency, hesitancy or incontinence. Denies headaches, seizures, numbness, or tingling. Denies depression, anxiety or insomnia. Denies skin break down or rash.        Objective:   Physical Exam  Patient alert and oriented and in no cardiopulmonary distress.Pt in pain  HEENT: No facial asymmetry, EOMI, no sinus tenderness,  oropharynx pink and moist.  Neck supple no adenopathy.  Chest: Clear to auscultation bilaterally.  CVS: S1, S2 no murmurs, no S3.  ABD: Soft non tender. Bowel sounds normal.  Ext: No edema  MS: decreased  ROM spine,  And left hip, adequate  shoulders and knees  Skin: Intact, no ulcerations or rash noted.  Psych: Good eye contact, normal affect. Memory intact not anxious or depressed appearing.  CNS: CN 2-12 intact, power, tone and sensation normal throughout.       Assessment & Plan:

## 2012-05-14 NOTE — Patient Instructions (Addendum)
Annual exam in November.  Fasting lipid , cmp TSH , HBA1C October 31 or after   You are being treated for acute arthritis flare in left hip. An xray of the hip is ordered, and you will get injections of toradol and depo medrol in the office , also medication is sent in.  Please call if symptoms are still severe next week

## 2012-05-17 NOTE — Assessment & Plan Note (Signed)
Hyperlipidemia:Low fat diet discussed and encouraged.  Controlled, no change in medication   

## 2012-05-17 NOTE — Assessment & Plan Note (Signed)
Controlled, no change in medication DASH diet and commitment to daily physical activity for a minimum of 30 minutes discussed and encouraged, as a part of hypertension management. The importance of attaining a healthy weight is also discussed.  

## 2012-05-17 NOTE — Assessment & Plan Note (Signed)
Controlled, no change in medication  

## 2012-05-17 NOTE — Assessment & Plan Note (Signed)
Acute onset, radiating from hip to calf, short sharp anti inflammatory course, will refer if symptoms persost

## 2012-05-26 ENCOUNTER — Ambulatory Visit (HOSPITAL_COMMUNITY)
Admission: RE | Admit: 2012-05-26 | Discharge: 2012-05-26 | Disposition: A | Discharge status: Home / Self Care | Payer: Medicare Other | Source: Ambulatory Visit | Attending: Family Medicine | Admitting: Family Medicine

## 2012-05-26 ENCOUNTER — Other Ambulatory Visit: Payer: Self-pay

## 2012-05-26 DIAGNOSIS — M25552 Pain in left hip: Secondary | ICD-10-CM

## 2012-05-26 DIAGNOSIS — C61 Malignant neoplasm of prostate: Secondary | ICD-10-CM | POA: Insufficient documentation

## 2012-05-26 DIAGNOSIS — M25559 Pain in unspecified hip: Secondary | ICD-10-CM | POA: Insufficient documentation

## 2012-05-26 MED ORDER — PREDNISONE (PAK) 5 MG PO TABS: 5.0000 mg | ORAL_TABLET | ORAL | Status: DC

## 2012-05-27 ENCOUNTER — Ambulatory Visit: Payer: Medicare Other | Admitting: Family Medicine

## 2012-08-20 LAB — COMPREHENSIVE METABOLIC PANEL
CO2: 30 mEq/L (ref 19–32)
Creat: 1.18 mg/dL (ref 0.50–1.35)
Glucose, Bld: 103 mg/dL — ABNORMAL HIGH (ref 70–99)
Total Bilirubin: 0.9 mg/dL (ref 0.3–1.2)

## 2012-08-20 LAB — LIPID PANEL
Cholesterol: 141 mg/dL (ref 0–200)
LDL Cholesterol: 75 mg/dL (ref 0–99)
Triglycerides: 59 mg/dL (ref <150)

## 2012-08-20 LAB — HEMOGLOBIN A1C: Hgb A1c MFr Bld: 6.2 % — ABNORMAL HIGH (ref <5.7)

## 2012-08-20 LAB — TSH: TSH: 1.933 u[IU]/mL (ref 0.350–4.500)

## 2012-08-26 ENCOUNTER — Ambulatory Visit (INDEPENDENT_AMBULATORY_CARE_PROVIDER_SITE_OTHER): Payer: Medicare Other | Admitting: Family Medicine

## 2012-08-26 ENCOUNTER — Encounter: Payer: Self-pay | Admitting: Family Medicine

## 2012-08-26 VITALS — BP 132/80 | HR 91 | Resp 18 | Ht 66.5 in | Wt 166.1 lb

## 2012-08-26 DIAGNOSIS — Z0000 Encounter for general adult medical examination without abnormal findings: Secondary | ICD-10-CM

## 2012-08-26 DIAGNOSIS — E785 Hyperlipidemia, unspecified: Secondary | ICD-10-CM

## 2012-08-26 DIAGNOSIS — E039 Hypothyroidism, unspecified: Secondary | ICD-10-CM

## 2012-08-26 DIAGNOSIS — R5381 Other malaise: Secondary | ICD-10-CM

## 2012-08-26 DIAGNOSIS — I10 Essential (primary) hypertension: Secondary | ICD-10-CM

## 2012-08-26 DIAGNOSIS — R7301 Impaired fasting glucose: Secondary | ICD-10-CM

## 2012-08-26 MED ORDER — OXYBUTYNIN CHLORIDE 5 MG PO TABS: ORAL_TABLET | ORAL | Status: DC

## 2012-08-26 NOTE — Progress Notes (Signed)
Subjective:    Patient ID: [name], male    DOB: [birth year], 71 y.o.   MRN: [id number]  HPI Preventive Screening-Counseling & Management   Patient present here today for a Medicare annual wellness visit.   Current Problems (verified)   Medications Prior to Visit Allergies (verified)   PAST HISTORY  Family History: 5 living brothers and 2 living sisters. 2 brothers deceased killed in the service, the other was uncontrolled diabetic died in his late 42's  Social History Married x 52 years, father of 5 children. Retired at age 39 from [employer] in [place]. No illicit drug use or nicotine  Risk Factors  Current exercise habits:  Current is 20 minutes daily, need to increase to 30 minutes  Dietary issues discussed:need to increase fruit and vegetable, and needs to commit to breakfast  Cardiac risk factors: Diabetes increases his risk  Depression Screen  (Note: if answer to either of the following is "Yes", a more complete depression screening is indicated)   Over the past two weeks, have you felt down, depressed or hopeless? No  Over the past two weeks, have you felt little interest or pleasure in doing things? No  Have you lost interest or pleasure in daily life? No  Do you often feel hopeless? No  Do you cry easily over simple problems? No   Activities of Daily Living  In your present state of health, do you have any difficulty performing the following activities?  Driving?: No Managing money?: No Feeding yourself?:No Getting from bed to chair?:No Climbing a flight of stairs?:No Preparing food and eating?:No Bathing or showering?:No Getting dressed?:No Getting to the toilet?:No Using the toilet?:No Moving around from place to place?: No  Fall Risk Assessment In the past year have you fallen or had a near fall?:No Are you currently taking any medications that make you dizziness?:No   Hearing Difficulties: No Do you often ask people  to speak up or repeat themselves?:No Do you experience ringing or noises in your ears?:No Do you have difficulty understanding soft or whispered voices?:No  Cognitive Testing  Alert? Yes Normal Appearance?Yes  Oriented to person? Yes Place? Yes  Time? Yes  Displays appropriate judgment?Yes  Can read the correct time from a watch face? yes Are you having problems remembering things?No  Advanced Directives have been discussed with the patient?Yes    List the Names of Other Physician/Practitioners you currently use: Dr. Jerre Simon     Assessment:    Annual Wellness Exam   Plan:    During the course of the visit the patient was educated and counseled about appropriate screening and preventive services including:  A healthy diet is rich in fruit, vegetables and whole grains. Poultry fish, nuts and beans are a healthy choice for protein rather then red meat. A low sodium diet and drinking 64 ounces of water daily is generally recommended. Oils and sweet should be limited. Carbohydrates especially for those who are diabetic or overweight, should be limited to 30-45 gram per meal. It is important to eat on a regular schedule, at least 3 times daily. Snacks should be primarily fruits, vegetables or nuts. It is important that you exercise regularly at least 30 minutes 5 times a week. If you develop chest pain, have severe difficulty breathing, or feel very tired, stop exercising immediately and seek medical attention  Immunization reviewed and updated. Cancer screening reviewed and updated    Patient Instructions (the written plan) was given to the patient.  Medicare  Attestation  I have personally reviewed:  The patient's medical and social history  Their use of alcohol, tobacco or illicit drugs  Their current medications and supplements  The patient's functional ability including ADLs,fall risks, home safety risks, cognitive, and hearing and visual impairment  Diet and physical activities   Evidence for depression or mood disorders  The patient's weight, height, BMI, and visual acuity have been recorded in the chart. I have made referrals, counseling, and provided education to the patient based on review of the above and I have provided the patient with a written personalized care plan for preventive services.      Review of Systems     Objective:   Physical Exam        Assessment & Plan:

## 2012-08-26 NOTE — Patient Instructions (Addendum)
F/u in 5.5 month  Fasting lipid, cmp and HBa1Cand TSH and CBC in 5.5 month  No med changes  Commit to daily physical activity please

## 2012-08-30 DIAGNOSIS — Z0000 Encounter for general adult medical examination without abnormal findings: Secondary | ICD-10-CM | POA: Insufficient documentation

## 2012-08-30 NOTE — Assessment & Plan Note (Signed)
Annual wellness performed and documented. Problem list, Providers, and history reviewed and updated. Lifestyle issues discussed to promote improved health, pt needs to commit more fully to daily exercise. Advanced directives discussed, need to be formalized in the presence of his wife, full code status, but if prognosis poor or brain death, then discontinuation of of life support within what is deemed a reasonable time

## 2012-09-03 ENCOUNTER — Telehealth: Payer: Self-pay | Admitting: Family Medicine

## 2012-09-03 MED ORDER — OXYBUTYNIN CHLORIDE 5 MG PO TABS: ORAL_TABLET | ORAL | Status: DC

## 2012-09-03 NOTE — Telephone Encounter (Signed)
Sent to his mail order pharmacy.

## 2012-09-08 ENCOUNTER — Other Ambulatory Visit: Payer: Self-pay

## 2012-09-08 ENCOUNTER — Telehealth: Payer: Self-pay | Admitting: Family Medicine

## 2012-09-08 MED ORDER — OXYBUTYNIN CHLORIDE 5 MG PO TABS: ORAL_TABLET | ORAL | Status: DC

## 2012-09-08 NOTE — Telephone Encounter (Signed)
Med reordered.

## 2013-02-03 ENCOUNTER — Other Ambulatory Visit: Payer: Self-pay | Admitting: Family Medicine

## 2013-02-04 LAB — LIPID PANEL
Cholesterol: 159 mg/dL (ref 0–200)
HDL: 58 mg/dL (ref >39)
Total CHOL/HDL Ratio: 2.7 Ratio
Triglycerides: 92 mg/dL (ref <150)

## 2013-02-04 LAB — COMPREHENSIVE METABOLIC PANEL
ALT: 18 U/L (ref 0–53)
BUN: 22 mg/dL (ref 6–23)
CO2: 31 mEq/L (ref 19–32)
Calcium: 9.8 mg/dL (ref 8.4–10.5)
Chloride: 104 mEq/L (ref 96–112)
Creat: 1.27 mg/dL (ref 0.50–1.35)
Glucose, Bld: 111 mg/dL — ABNORMAL HIGH (ref 70–99)

## 2013-02-04 LAB — CBC WITH DIFFERENTIAL/PLATELET
Eosinophils Relative: 4 % (ref 0–5)
HCT: 46.1 % (ref 39.0–52.0)
Lymphocytes Relative: 31 % (ref 12–46)
Lymphs Abs: 1.5 10*3/uL (ref 0.7–4.0)
MCH: 27 pg (ref 26.0–34.0)
MCV: 81.3 fL (ref 78.0–100.0)
Monocytes Absolute: 0.3 10*3/uL (ref 0.1–1.0)
Monocytes Relative: 6 % (ref 3–12)
RBC: 5.67 MIL/uL (ref 4.22–5.81)
WBC: 4.8 10*3/uL (ref 4.0–10.5)

## 2013-02-04 LAB — HEMOGLOBIN A1C
Hgb A1c MFr Bld: 6.3 % — ABNORMAL HIGH (ref <5.7)
Mean Plasma Glucose: 134 mg/dL — ABNORMAL HIGH (ref <117)

## 2013-02-09 ENCOUNTER — Encounter: Payer: Self-pay | Admitting: Family Medicine

## 2013-02-09 ENCOUNTER — Ambulatory Visit (INDEPENDENT_AMBULATORY_CARE_PROVIDER_SITE_OTHER): Payer: Medicare Other | Admitting: Family Medicine

## 2013-02-09 VITALS — BP 132/72 | HR 88 | Resp 18 | Ht 66.5 in | Wt 161.1 lb

## 2013-02-09 DIAGNOSIS — E039 Hypothyroidism, unspecified: Secondary | ICD-10-CM

## 2013-02-09 DIAGNOSIS — R7309 Other abnormal glucose: Secondary | ICD-10-CM

## 2013-02-09 DIAGNOSIS — J301 Allergic rhinitis due to pollen: Secondary | ICD-10-CM

## 2013-02-09 DIAGNOSIS — E785 Hyperlipidemia, unspecified: Secondary | ICD-10-CM

## 2013-02-09 DIAGNOSIS — I10 Essential (primary) hypertension: Secondary | ICD-10-CM

## 2013-02-09 DIAGNOSIS — F528 Other sexual dysfunction not due to a substance or known physiological condition: Secondary | ICD-10-CM

## 2013-02-09 MED ORDER — AMLODIPINE BESYLATE 10 MG PO TABS: 10.0000 mg | ORAL_TABLET | Freq: Every day | ORAL | Status: DC

## 2013-02-09 NOTE — Progress Notes (Signed)
  Subjective:    Patient ID: [name], male    DOB: Jan 02, 1941, 72 y.o.   MRN: [id number]  HPI The PT is here for follow up and re-evaluation of chronic medical conditions, medication management and review of any available recent lab and radiology data.  Preventive health is updated, specifically  Cancer screening and Immunization.   Questions or concerns regarding consultations or procedures which the PT has had in the interim are  addressed. The PT denies any adverse reactions to current medications since the last visit.  There are no new concerns.  There are no specific complaints       Review of Systems See HPI Denies recent fever or chills. Denies sinus pressure, nasal congestion, ear pain or sore throat. Denies chest congestion, productive cough or wheezing. Denies chest pains, palpitations and leg swelling Denies abdominal pain, nausea, vomiting,diarrhea or constipation.   Denies dysuria, frequency, hesitancy or incontinence. Denies joint pain, swelling and limitation in mobility. Denies headaches, seizures, numbness, or tingling. Denies depression, anxiety or insomnia. Denies skin break down or rash.        Objective:   Physical Exam Patient alert and oriented and in no cardiopulmonary distress.  HEENT: No facial asymmetry, EOMI, no sinus tenderness,  oropharynx pink and moist.  Neck supple no adenopathy.  Chest: Clear to auscultation bilaterally.  CVS: S1, S2 no murmurs, no S3.  ABD: Soft non tender. Bowel sounds normal.  Ext: No edema  MS: Adequate ROM spine, shoulders, hips and knees.  Skin: Intact, no ulcerations or rash noted.  Psych: Good eye contact, normal affect. Memory intact not anxious or depressed appearing.  CNS: CN 2-12 intact, power, tone and sensation normal throughout.        Assessment & Plan:

## 2013-02-09 NOTE — Patient Instructions (Addendum)
Annual wellness in 5 month  It is important that you exercise regularly at least 30 minutes 5 times a week. If you develop chest pain, have severe difficulty breathing, or feel very tired, stop exercising immediately and seek medical attention   Cholesterol is excellent, no med change.   Blood sugar has increased slightly , cut back on carbohydrates and sugars  I will try to get samples of cialis for you, however, please speak with Dr Jerre Simon about use of a vacuum pump  Keep active and eat a lot of vegetable and fruit  TSH, HBA1C and chem 7  in 5 month

## 2013-02-13 NOTE — Assessment & Plan Note (Signed)
Controlled, no change in medication DASH diet and commitment to daily physical activity for a minimum of 30 minutes discussed and encouraged, as a part of hypertension management. The importance of attaining a healthy weight is also discussed.  

## 2013-02-13 NOTE — Assessment & Plan Note (Signed)
Deteriorated, Patient educated about the importance of limiting  Carbohydrate intake , the need to commit to daily physical activity for a minimum of 30 minutes , and to commit weight loss. The fact that changes in all these areas will reduce or eliminate all together the development of diabetes is stressed.

## 2013-02-13 NOTE — Assessment & Plan Note (Signed)
Controlled, no change in medication  

## 2013-02-13 NOTE — Assessment & Plan Note (Signed)
Unchanged, encouraged pt to discuss with urology use of a vacuum pump  Will attempt to get samples of meds

## 2013-02-13 NOTE — Assessment & Plan Note (Signed)
Hyperlipidemia:Low fat diet discussed and encouraged.  Controlled, no change in medication   

## 2013-07-07 ENCOUNTER — Other Ambulatory Visit: Payer: Self-pay | Admitting: Family Medicine

## 2013-07-07 LAB — HEMOGLOBIN A1C
Hgb A1c MFr Bld: 6.4 % — ABNORMAL HIGH (ref <5.7)
Mean Plasma Glucose: 137 mg/dL — ABNORMAL HIGH (ref <117)

## 2013-07-07 LAB — BASIC METABOLIC PANEL
BUN: 20 mg/dL (ref 6–23)
Calcium: 9.3 mg/dL (ref 8.4–10.5)
Chloride: 102 mEq/L (ref 96–112)
Creat: 1.14 mg/dL (ref 0.50–1.35)

## 2013-07-07 LAB — TSH: TSH: 2.697 u[IU]/mL (ref 0.350–4.500)

## 2013-07-12 ENCOUNTER — Encounter: Payer: Self-pay | Admitting: Family Medicine

## 2013-07-12 ENCOUNTER — Ambulatory Visit (INDEPENDENT_AMBULATORY_CARE_PROVIDER_SITE_OTHER): Payer: Medicare Other | Admitting: Family Medicine

## 2013-07-12 ENCOUNTER — Other Ambulatory Visit: Payer: Self-pay

## 2013-07-12 VITALS — BP 120/82 | HR 83 | Resp 16 | Ht 66.5 in | Wt 159.8 lb

## 2013-07-12 DIAGNOSIS — C61 Malignant neoplasm of prostate: Secondary | ICD-10-CM

## 2013-07-12 DIAGNOSIS — E039 Hypothyroidism, unspecified: Secondary | ICD-10-CM

## 2013-07-12 DIAGNOSIS — E785 Hyperlipidemia, unspecified: Secondary | ICD-10-CM

## 2013-07-12 DIAGNOSIS — R195 Other fecal abnormalities: Secondary | ICD-10-CM

## 2013-07-12 DIAGNOSIS — N529 Male erectile dysfunction, unspecified: Secondary | ICD-10-CM

## 2013-07-12 DIAGNOSIS — R7309 Other abnormal glucose: Secondary | ICD-10-CM

## 2013-07-12 DIAGNOSIS — I10 Essential (primary) hypertension: Secondary | ICD-10-CM

## 2013-07-12 DIAGNOSIS — Z1211 Encounter for screening for malignant neoplasm of colon: Secondary | ICD-10-CM

## 2013-07-12 LAB — POC HEMOCCULT BLD/STL (OFFICE/1-CARD/DIAGNOSTIC)

## 2013-07-12 MED ORDER — POTASSIUM CHLORIDE CRYS ER 20 MEQ PO TBCR: 20.0000 meq | EXTENDED_RELEASE_TABLET | Freq: Every day | ORAL | Status: DC

## 2013-07-12 MED ORDER — AMLODIPINE BESYLATE 10 MG PO TABS: 10.0000 mg | ORAL_TABLET | Freq: Every day | ORAL | Status: DC

## 2013-07-12 MED ORDER — LOVASTATIN 40 MG PO TABS: 40.0000 mg | ORAL_TABLET | Freq: Every day | ORAL | Status: DC

## 2013-07-12 MED ORDER — OXYBUTYNIN CHLORIDE 5 MG PO TABS: ORAL_TABLET | ORAL | Status: DC

## 2013-07-12 NOTE — Assessment & Plan Note (Signed)
Controlled, no change in medication  

## 2013-07-12 NOTE — Assessment & Plan Note (Signed)
Hyperlipidemia:Low fat diet discussed and encouraged. Updated lab next lab draw

## 2013-07-12 NOTE — Assessment & Plan Note (Signed)
C/o difficulty attaining and maintaining erections, will request samples of cialis preferably

## 2013-07-12 NOTE — Assessment & Plan Note (Signed)
Controlled well, continue low carb diet and regular exercise

## 2013-07-12 NOTE — Assessment & Plan Note (Signed)
Recent note made of BRRB with defecation at times and pt has piles and stool is also heme positive, will refer  for GI eval and recommendation

## 2013-07-12 NOTE — Progress Notes (Signed)
  Subjective:   HPI  The PT is here for follow up and re-evaluation of chronic medical conditions, medication management and review of any available recent lab and radiology data.  Preventive health is updated, specifically  Cancer screening and Immunization.   Questions or concerns regarding consultations or procedures which the PT has had in the interim are  addressed. The PT denies any adverse reactions to current medications since the last visit.  There are no new concerns.  There are no specific complaints           Assessment:     Plan:    IReview of Systems See HPI Denies recent fever or chills. Denies sinus pressure, nasal congestion, ear pain or sore throat. Denies chest congestion, productive cough or wheezing. Denies chest pains, palpitations and leg swelling Denies abdominal pain, nausea, vomiting,diarrhea or constipation. Does note BRRB at defecation from time to time  Denies dysuria, frequency, hesitancy or incontinence.Ongoing problems with ED requests samples Denies uncontrolled  joint pain, swelling and limitation in mobility. Denies headaches, seizures, numbness, or tingling.Does note some knee pain and stiffness at times Denies depression, anxiety or insomnia. Denies skin break down or rash.        Objective:   Physical Exam   Patient alert and oriented and in no cardiopulmonary distress.  HEENT: No facial asymmetry, EOMI, no sinus tenderness,  oropharynx pink and moist.  Neck supple no adenopathy.  Chest: Clear to auscultation bilaterally.  CVS: S1, S2 no murmurs, no S3.  ABD: Soft non tender. Bowel sounds normal. Rectal: positive piles and heme positive stool Ext: No edema  MS: Adequate ROM spine, shoulders, hips and knees.  Skin: Intact, no ulcerations or rash noted.  Psych: Good eye contact, normal affect. Memory intact not anxious or depressed appearing.  CNS: CN 2-12 intact, power, tone and sensation normal throughout.       Assessment & Plan:

## 2013-07-12 NOTE — Assessment & Plan Note (Signed)
Appointment in next 1 to 2 months with urology

## 2013-07-12 NOTE — Patient Instructions (Addendum)
Annual wellness in 5.5 month, call if you need me before  Rectal exam has hidden blood, you report straining at stool, you need to use stool softener and drinhk a lot of water and eat high fiber  Foods so you go regularly and not strain. I am also referring you to you GI Doc to determine if you need your colonoscopy a bit sooner , you will get a letter from dr Rehmn's office requesting you call to schedule an appointment  Blood pressure and labs are excellent  Fasting lipid, cmp and TSH in 5.5 month, before next visit  We will request cialis samples for you and call when available

## 2013-07-12 NOTE — Addendum Note (Signed)
Addended by: Abner Greenspan on: 07/12/2013 01:27 PM   Modules accepted: Orders

## 2013-07-12 NOTE — Assessment & Plan Note (Signed)
Controlled, no change in medication DASH diet and commitment to daily physical activity for a minimum of 30 minutes discussed and encouraged, as a part of hypertension management. The importance of attaining a healthy weight is also discussed.  

## 2013-08-19 ENCOUNTER — Encounter (INDEPENDENT_AMBULATORY_CARE_PROVIDER_SITE_OTHER): Payer: Self-pay | Admitting: *Deleted

## 2013-08-30 ENCOUNTER — Encounter (INDEPENDENT_AMBULATORY_CARE_PROVIDER_SITE_OTHER): Payer: Self-pay | Admitting: Internal Medicine

## 2013-08-30 ENCOUNTER — Ambulatory Visit (INDEPENDENT_AMBULATORY_CARE_PROVIDER_SITE_OTHER): Payer: Medicare Other | Admitting: Internal Medicine

## 2013-08-30 VITALS — BP 124/84 | HR 84 | Temp 98.6°F | Ht 67.5 in | Wt 161.2 lb

## 2013-08-30 DIAGNOSIS — R195 Other fecal abnormalities: Secondary | ICD-10-CM

## 2013-08-30 NOTE — Patient Instructions (Signed)
Colonoscopy with Dr. [name]. The risks and benefits such as perforation, bleeding, and infection were reviewed with the patient and is agreeable. 

## 2013-08-30 NOTE — Progress Notes (Signed)
Subjective:     Patient ID: [name], male   DOB: [birth year], 72 y.o.   MRN: [id number]  HPI Referred to our office by Dr. Lodema Hong for heme positive stools. He tells me when he wipes after having a BM he will see blood.  He sees blood occasionally. Symptoms for about a month. He denies being constipated. He denies any abdominal pain. There has been no weight loss.  He usually has a BM about once a day or every three days.  He denies any black stools. His BMs are tan in color. He does not see blood on the stool. His appetite is good for the most part. He rarely has acid reflux and none lately.   Last colonoscopy in 2006 by Dr. Katrinka Blazing and he reports it was normal. (Screening).    CBC    Component Value Date/Time   WBC 4.8 02/03/2013 1100   RBC 5.67 02/03/2013 1100   HGB 15.3 02/03/2013 1100   HCT 46.1 02/03/2013 1100   PLT 233 02/03/2013 1100   MCV 81.3 02/03/2013 1100   MCH 27.0 02/03/2013 1100   MCHC 33.2 02/03/2013 1100   RDW 14.7 02/03/2013 1100   LYMPHSABS 1.5 02/03/2013 1100   MONOABS 0.3 02/03/2013 1100   EOSABS 0.2 02/03/2013 1100   BASOSABS 0.1 02/03/2013 1100      Review of Systems see hpi Current Outpatient Prescriptions  Medication Sig Dispense Refill  . amLODipine (NORVASC) 10 MG tablet Take 1 tablet (10 mg total) by mouth daily. One tablet by mouth once daily for blood pressure  90 tablet  1  . cetirizine (ZYRTEC ALLERGY) 10 MG tablet Take 10 mg by mouth daily. One by mouth once daily for allergies       . levothyroxine (SYNTHROID, LEVOTHROID) 75 MCG tablet Take 1 tablet (75 mcg total) by mouth daily. For thyroid  90 tablet  1  . lovastatin (MEVACOR) 40 MG tablet Take 1 tablet (40 mg total) by mouth daily.  90 tablet  1  . Multiple Vitamins-Minerals (MULTIVITAMINS THER. W/MINERALS) TABS Take 1 tablet by mouth daily.        Marland Kitchen oxybutynin (DITROPAN) 5 MG tablet TAKE 1 TABLET BY MOUTH EVERY DAY FOR URINARY INCONTINENCE  90 tablet  1  . potassium chloride SA  (K-DUR,KLOR-CON) 20 MEQ tablet Take 1 tablet (20 mEq total) by mouth daily. Take one tablet by mouth once a day for potassium  90 tablet  1  . hydrochlorothiazide (MICROZIDE) 12.5 MG capsule Take 1 capsule (12.5 mg total) by mouth daily.  90 capsule  1  . tadalafil (CIALIS) 10 MG tablet Take 1 tablet (10 mg total) by mouth as needed for erectile dysfunction.  10 tablet  1  . [DISCONTINUED] oxybutynin (DITROPAN) 5 MG tablet TAKE 1 TABLET BY MOUTH EVERY DAY FOR URINARY INCONTINENCE  90 tablet  1   No current facility-administered medications for this visit.   Past Medical History  Diagnosis Date  . Allergic rhinitis, seasonal   . Erectile dysfunction   . Hypothyroidism   . Hyperlipidemia   . Hypertension   . BPH (benign prostatic hypertrophy)   . Hx of thyroidectomy   . Diabetes mellitus, type 2     diet controlled  . Prostate cancer 2011    treated with radiation  . Urosepsis 07/2011    ICU admission [facility]   Past Surgical History  Procedure Laterality Date  . Thyroidectomy  2003  . Transurethral resection of thr prostate  2003  .  Ureteral stone extraction  08/2011  . Prostate surgery     Allergies  Allergen Reactions  . Peanut-Containing Drug Products Shortness Of Breath and Swelling  . Eggs Or Egg-Derived Products Swelling  . Iodine Swelling  . Shellfish Allergy Swelling  . Penicillins Hives and Rash        Objective:   Physical Exam  Filed Vitals:   08/30/13 1109  BP: 124/84  Pulse: 84  Temp: 98.6 F (37 C)  Height: 5' 7.5" (1.715 m)  Weight: 161 lb 3.2 oz (73.12 kg)   Alert and oriented. Skin warm and dry. Oral mucosa is moist.   . Sclera anicteric, conjunctivae is pink. Thyroid not enlarged. No cervical lymphadenopathy. Lungs clear. Heart regular rate and rhythm.  Abdomen is soft. Bowel sounds are positive. No hepatomegaly. No abdominal masses felt. No tenderness.  No edema to lower extremities. Stool brown and some bright red blood noted. Guaiac positive.      Assessment:   Heme positive stools. Colonic neoplasm needs to be ruled out,.  Hemorrhoids, polyp are also in the differential    Plan:    Colonoscopy with Dr Karilyn Cota.   The risks and benefits such as perforation, bleeding, and infection were reviewed with the patient and is agreeable.

## 2013-09-01 ENCOUNTER — Other Ambulatory Visit (INDEPENDENT_AMBULATORY_CARE_PROVIDER_SITE_OTHER): Payer: Self-pay | Admitting: *Deleted

## 2013-09-01 ENCOUNTER — Encounter (INDEPENDENT_AMBULATORY_CARE_PROVIDER_SITE_OTHER): Payer: Self-pay | Admitting: *Deleted

## 2013-09-01 DIAGNOSIS — R195 Other fecal abnormalities: Secondary | ICD-10-CM

## 2013-09-03 ENCOUNTER — Encounter (HOSPITAL_COMMUNITY)
Admission: RE | Disposition: A | Discharge status: Home / Self Care | Payer: Self-pay | Source: Ambulatory Visit | Attending: Internal Medicine

## 2013-09-03 ENCOUNTER — Encounter (HOSPITAL_COMMUNITY): Payer: Self-pay | Admitting: *Deleted

## 2013-09-03 ENCOUNTER — Ambulatory Visit (HOSPITAL_COMMUNITY)
Admission: RE | Admit: 2013-09-03 | Discharge: 2013-09-03 | Disposition: A | Discharge status: Home / Self Care | Payer: Medicare Other | Source: Ambulatory Visit | Attending: Internal Medicine | Admitting: Internal Medicine

## 2013-09-03 DIAGNOSIS — K644 Residual hemorrhoidal skin tags: Secondary | ICD-10-CM

## 2013-09-03 DIAGNOSIS — R195 Other fecal abnormalities: Secondary | ICD-10-CM

## 2013-09-03 DIAGNOSIS — K6289 Other specified diseases of anus and rectum: Secondary | ICD-10-CM | POA: Insufficient documentation

## 2013-09-03 DIAGNOSIS — K5730 Diverticulosis of large intestine without perforation or abscess without bleeding: Secondary | ICD-10-CM | POA: Insufficient documentation

## 2013-09-03 DIAGNOSIS — Z8546 Personal history of malignant neoplasm of prostate: Secondary | ICD-10-CM | POA: Insufficient documentation

## 2013-09-03 DIAGNOSIS — D126 Benign neoplasm of colon, unspecified: Secondary | ICD-10-CM

## 2013-09-03 DIAGNOSIS — K520 Gastroenteritis and colitis due to radiation: Secondary | ICD-10-CM

## 2013-09-03 DIAGNOSIS — K921 Melena: Secondary | ICD-10-CM | POA: Insufficient documentation

## 2013-09-03 DIAGNOSIS — I10 Essential (primary) hypertension: Secondary | ICD-10-CM | POA: Insufficient documentation

## 2013-09-03 HISTORY — PX: COLONOSCOPY: SHX5424

## 2013-09-03 SURGERY — COLONOSCOPY
Anesthesia: Moderate Sedation

## 2013-09-03 MED ORDER — SODIUM CHLORIDE 0.9 % IV SOLN
INTRAVENOUS | Status: DC
  Administered 2013-09-03: 12:00:00 via INTRAVENOUS

## 2013-09-03 MED ORDER — MIDAZOLAM HCL 5 MG/5ML IJ SOLN
INTRAMUSCULAR | Status: DC | PRN
  Administered 2013-09-03 (×2): 2 mg via INTRAVENOUS
  Administered 2013-09-03: 1 mg via INTRAVENOUS

## 2013-09-03 MED ORDER — MEPERIDINE HCL 50 MG/ML IJ SOLN
INTRAMUSCULAR | Status: AC
  Filled 2013-09-03: qty 1

## 2013-09-03 MED ORDER — MEPERIDINE HCL 50 MG/ML IJ SOLN
INTRAMUSCULAR | Status: DC | PRN
  Administered 2013-09-03 (×2): 25 mg via INTRAVENOUS

## 2013-09-03 MED ORDER — MIDAZOLAM HCL 5 MG/5ML IJ SOLN
INTRAMUSCULAR | Status: AC
  Filled 2013-09-03: qty 10

## 2013-09-03 NOTE — H&P (Signed)
[name]. is an 72 y.o. male.   Chief Complaint: Patient is here for colonoscopy. HPI: Patient is 72 year old African male who is here for diagnostic colonoscopy. He was recently found to have heme-positive stools his primary care physician Dr. Syliva Overman. He denies melena or frank rectal bleeding. Every now and then he notices blood on the tissue. He denies abdominal pain anorexia weight loss. His last colonoscopy was more than 8 years ago. Patient does not take NSAIDs. Family history is negative for CRC.  Past Medical History  Diagnosis Date  . Allergic rhinitis, seasonal   . Erectile dysfunction   . Hypothyroidism   . Hyperlipidemia   . Hypertension   . BPH (benign prostatic hypertrophy)   . Hx of thyroidectomy   . Prostate cancer 2011    treated with radiation  . Urosepsis 07/2011    ICU admission [facility]    Past Surgical History  Procedure Laterality Date  . Thyroidectomy  2003  . Transurethral resection of thr prostate  2003  . Ureteral stone extraction  08/2011  . Prostate surgery      Family History  Problem Relation Age of Onset  . Stroke Mother   . Heart failure Mother   . Diabetes Father   . Hypertension Father   . Kidney disease Brother   . Diabetes Brother    Social History:  reports that he has never smoked. He has never used smokeless tobacco. He reports that he does not drink alcohol or use illicit drugs.  Allergies:  Allergies  Allergen Reactions  . Peanut-Containing Drug Products Shortness Of Breath and Swelling  . Eggs Or Egg-Derived Products Swelling  . Iodine Swelling  . Shellfish Allergy Swelling  . Penicillins Hives and Rash    Medications Prior to Admission  Medication Sig Dispense Refill  . amLODipine (NORVASC) 10 MG tablet Take 1 tablet (10 mg total) by mouth daily. One tablet by mouth once daily for blood pressure  90 tablet  1  . cetirizine (ZYRTEC ALLERGY) 10 MG tablet Take 10 mg by mouth daily. One by mouth once daily  for allergies       . hydrochlorothiazide (MICROZIDE) 12.5 MG capsule Take 1 capsule (12.5 mg total) by mouth daily.  90 capsule  1  . levothyroxine (SYNTHROID, LEVOTHROID) 75 MCG tablet Take 1 tablet (75 mcg total) by mouth daily. For thyroid  90 tablet  1  . lovastatin (MEVACOR) 40 MG tablet Take 1 tablet (40 mg total) by mouth daily.  90 tablet  1  . Multiple Vitamins-Minerals (MULTIVITAMINS THER. W/MINERALS) TABS Take 1 tablet by mouth daily.        Marland Kitchen oxybutynin (DITROPAN) 5 MG tablet TAKE 1 TABLET BY MOUTH EVERY DAY FOR URINARY INCONTINENCE  90 tablet  1  . potassium chloride SA (K-DUR,KLOR-CON) 20 MEQ tablet Take 1 tablet (20 mEq total) by mouth daily. Take one tablet by mouth once a day for potassium  90 tablet  1  . tadalafil (CIALIS) 10 MG tablet Take 1 tablet (10 mg total) by mouth as needed for erectile dysfunction.  10 tablet  1    No results found for this or any previous visit (from the past 48 hour(s)). No results found.  ROS  Blood pressure 133/94, pulse 95, temperature 98.3 F (36.8 C), temperature source Oral, resp. rate 18, height 5' 7.5" (1.715 m), weight 161 lb (73.029 kg), SpO2 99.00%. Physical Exam  Constitutional: He appears well-developed and well-nourished.  HENT:  Mouth/Throat:  Oropharynx is clear and moist.  Eyes: Conjunctivae are normal. No scleral icterus.  Neck: No thyromegaly present.  Cardiovascular: Regular rhythm and normal heart sounds.   No murmur heard. Respiratory: Effort normal and breath sounds normal.  Midsternal scar  GI: Soft. He exhibits no distension. There is no tenderness.  Musculoskeletal: He exhibits no edema.  Lymphadenopathy:    He has no cervical adenopathy.  Neurological: He is alert.  Skin: Skin is warm and dry.     Assessment/Plan Heme positive stool. Diagnostic colonoscopy.  [name],[name] 09/03/2013, 12:01 PM

## 2013-09-03 NOTE — Op Note (Signed)
COLONOSCOPY PROCEDURE REPORT  PATIENT:  [name].  MR#:  [id number] Birthdate:  July 30, 1941, 72 y.o., male Endoscopist:  Dr. Malissa Hippo, MD Referred By:  Dr. Syliva Overman, MD  Procedure Date: 09/03/2013  Procedure:   Colonoscopy with snare polypectomy and Hemoclip application.  Indications:  Patient is 72 year old African male who is undergoing diagnostic colonoscopy because of heme positive stools. His last colonoscopy was over 8 years ago.  Informed Consent:  The procedure and risks were reviewed with the patient and informed consent was obtained.  Medications:  Demerol 50 mg IV Versed 5 mg IV  Description of procedure:  After a digital rectal exam was performed, that colonoscope was advanced from the anus through the rectum and colon to the area of the cecum, ileocecal valve and appendiceal orifice. The cecum was deeply intubated. These structures were well-seen and photographed for the record. From the level of the cecum and ileocecal valve, the scope was slowly and cautiously withdrawn. The mucosal surfaces were carefully surveyed utilizing scope tip to flexion to facilitate fold flattening as needed. The scope was pulled down into the rectum where a thorough exam including retroflexion was performed.  Findings:   Prep satisfactory. 8 mm polyp snared from the hepatic flexure. Over 20 mm lobulated broad-based polyp snared from hepatic flexure. 2 hemoclips applied to polypectomy site. Polyp retrieved using a Roth net. Scattered diverticula at sigmoid colon. Multiple telangiectasia at distal rectum. Small hemorrhoids below the dentate line.   Therapeutic/Diagnostic Maneuvers Performed:  See above  Complications:  None  Cecal Withdrawal Time:  25 minutes  Impression:  Examination performed to cecum. Large broad-based polyp snared from hepatic flexure and two hemoclips applied to polypectomy site. 8mm polyp also snare from hepatic flexure. Mild sigmoid colon  diverticulosis. Radiation proctitis and small external hemorrhoids.  Recommendations:  Standard instructions given. Patient advised not to undergo MRI until hemoclips have passed. I will contact patient with biopsy results and further recommendations.  Corrine Tillis U  09/03/2013 1:01 PM  CC: Dr. Syliva Overman, MD & Dr. Bonnetta Barry ref. provider found

## 2013-09-13 ENCOUNTER — Encounter (HOSPITAL_COMMUNITY): Payer: Self-pay | Admitting: Internal Medicine

## 2013-11-05 ENCOUNTER — Other Ambulatory Visit: Payer: Self-pay

## 2013-11-05 DIAGNOSIS — E785 Hyperlipidemia, unspecified: Secondary | ICD-10-CM

## 2013-11-05 MED ORDER — LOVASTATIN 40 MG PO TABS: 40.0000 mg | ORAL_TABLET | Freq: Every day | ORAL | Status: DC

## 2013-11-05 MED ORDER — POTASSIUM CHLORIDE CRYS ER 20 MEQ PO TBCR: 20.0000 meq | EXTENDED_RELEASE_TABLET | Freq: Every day | ORAL | Status: DC

## 2013-11-05 MED ORDER — AMLODIPINE BESYLATE 10 MG PO TABS: 10.0000 mg | ORAL_TABLET | Freq: Every day | ORAL | Status: DC

## 2013-12-14 LAB — LIPID PANEL
Cholesterol: 137 mg/dL (ref 0–200)
HDL: 59 mg/dL (ref >39)
LDL Cholesterol: 65 mg/dL (ref 0–99)
Total CHOL/HDL Ratio: 2.3 Ratio
Triglycerides: 65 mg/dL (ref <150)
VLDL: 13 mg/dL (ref 0–40)

## 2013-12-14 LAB — TSH: TSH: 2.425 u[IU]/mL (ref 0.350–4.500)

## 2013-12-14 LAB — COMPREHENSIVE METABOLIC PANEL
ALT: 18 U/L (ref 0–53)
AST: 19 U/L (ref 0–37)
Albumin: 4.3 g/dL (ref 3.5–5.2)
Alkaline Phosphatase: 54 U/L (ref 39–117)
BUN: 18 mg/dL (ref 6–23)
CO2: 31 mEq/L (ref 19–32)
Calcium: 9.4 mg/dL (ref 8.4–10.5)
Chloride: 102 mEq/L (ref 96–112)
Creat: 1.23 mg/dL (ref 0.50–1.35)
Glucose, Bld: 103 mg/dL — ABNORMAL HIGH (ref 70–99)
Potassium: 4.3 mEq/L (ref 3.5–5.3)
Sodium: 141 mEq/L (ref 135–145)
Total Bilirubin: 0.7 mg/dL (ref 0.2–1.2)
Total Protein: 6.9 g/dL (ref 6.0–8.3)

## 2013-12-20 ENCOUNTER — Encounter: Payer: Self-pay | Admitting: Family Medicine

## 2013-12-20 ENCOUNTER — Ambulatory Visit (INDEPENDENT_AMBULATORY_CARE_PROVIDER_SITE_OTHER): Payer: Medicare Other | Admitting: Family Medicine

## 2013-12-20 ENCOUNTER — Encounter (INDEPENDENT_AMBULATORY_CARE_PROVIDER_SITE_OTHER): Payer: Self-pay

## 2013-12-20 VITALS — BP 130/84 | HR 80 | Resp 16 | Ht 67.5 in | Wt 164.4 lb

## 2013-12-20 DIAGNOSIS — R5381 Other malaise: Secondary | ICD-10-CM

## 2013-12-20 DIAGNOSIS — E785 Hyperlipidemia, unspecified: Secondary | ICD-10-CM

## 2013-12-20 DIAGNOSIS — I10 Essential (primary) hypertension: Secondary | ICD-10-CM

## 2013-12-20 DIAGNOSIS — R7309 Other abnormal glucose: Secondary | ICD-10-CM

## 2013-12-20 DIAGNOSIS — R5383 Other fatigue: Secondary | ICD-10-CM

## 2013-12-20 DIAGNOSIS — E039 Hypothyroidism, unspecified: Secondary | ICD-10-CM

## 2013-12-20 DIAGNOSIS — Z0000 Encounter for general adult medical examination without abnormal findings: Secondary | ICD-10-CM

## 2013-12-20 MED ORDER — HYDROCHLOROTHIAZIDE 12.5 MG PO CAPS: 12.5000 mg | ORAL_CAPSULE | Freq: Every day | ORAL | Status: DC

## 2013-12-20 MED ORDER — AMLODIPINE BESYLATE 10 MG PO TABS: 10.0000 mg | ORAL_TABLET | Freq: Every day | ORAL | Status: DC

## 2013-12-20 MED ORDER — POTASSIUM CHLORIDE CRYS ER 20 MEQ PO TBCR: 20.0000 meq | EXTENDED_RELEASE_TABLET | Freq: Every day | ORAL | Status: DC

## 2013-12-20 MED ORDER — OXYBUTYNIN CHLORIDE 5 MG PO TABS: ORAL_TABLET | ORAL | Status: DC

## 2013-12-20 MED ORDER — LOVASTATIN 40 MG PO TABS: 40.0000 mg | ORAL_TABLET | Freq: Every day | ORAL | Status: DC

## 2013-12-20 MED ORDER — LEVOTHYROXINE SODIUM 75 MCG PO TABS: 75.0000 ug | ORAL_TABLET | Freq: Every day | ORAL | Status: DC

## 2013-12-20 MED ORDER — ASPIRIN EC 81 MG PO TBEC: 81.0000 mg | DELAYED_RELEASE_TABLET | Freq: Every day | ORAL | Status: AC

## 2013-12-20 NOTE — Assessment & Plan Note (Addendum)
Annual exam as documented. Counseling done  re healthy lifestyle involving commitment to 150 minutes exercise per week, heart healthy diet, and attaining healthy weight.The importance of adequate sleep also discussed. Regular seat belt use and safe storage  of firearms if patient has them, is also discussed. Changes in health habits are decided on by the patient with goals and time frames  set for achieving them. Immunization and cancer screening needs are specifically addressed at this visit. Fall risk reduction discussed and information provided

## 2013-12-20 NOTE — Progress Notes (Signed)
Subjective:    Patient ID: [name], male    DOB: Mar 29, 1941, 73 y.o.   MRN: [id number]  HPI Preventive Screening-Counseling & Management   Patient present here today for a Medicare annual wellness visit.   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 No  Over the past two weeks, have you felt little interest or pleasure in doing things? No  Have you lost interest or pleasure in daily life? No  Do you often feel hopeless? No  Do you cry easily over simple problems? No   Activities of Daily Living  In your present state of health, do you have any difficulty performing the following activities?  Driving?: No Managing money?: No Feeding yourself?:No Getting from bed to chair?:No Climbing a flight of stairs?:No Preparing food and eating?:No Bathing or showering?:No Getting dressed?:No Getting to the toilet?:No Using the toilet?:No Moving around from place to place?: No  Fall Risk Assessment In the past year have you fallen or had a near fall?:No Are you currently taking any medications that make you dizzy?:No   Hearing Difficulties: No Do you often ask people to speak up or repeat themselves?:No Do you experience ringing or noises in your ears?:No Do you have difficulty understanding soft or whispered  voices?:No  Cognitive Testing  Alert? Yes Normal Appearance?Yes  Oriented to person? Yes Place? Yes  Time? Yes  Displays appropriate judgment?Yes  Can read the correct time from a watch face? yes Are you having problems remembering things?No  Advanced Directives have been discussed with the patient?Yes , full code   List the Names of Other Physician/Practitioners you currently use: [employer]   Indicate any recent Medical Services you may have received from other than [facility] providers in the past year (date may be approximate).   Assessment:    Annual Wellness Exam   Plan:    During the course of the visit the patient was educated and counseled about appropriate screening and preventive services including:  A healthy diet is rich in fruit, vegetables and whole grains. Poultry fish, nuts and beans are a healthy choice for protein rather then red meat. A low sodium diet and drinking 64 ounces of water daily is generally recommended. Oils and sweet should be limited. Carbohydrates especially for those who are diabetic or overweight, should be limited to 30-45 gram per meal. It is important to eat on a regular schedule, at least 3 times daily. Snacks should be primarily fruits, vegetables or nuts. It is important that you exercise regularly at least 30 minutes 5 times a week. If you develop chest pain, have severe difficulty breathing, or feel very tired, stop exercising immediately and seek medical attention  Immunization reviewed and updated. Cancer screening reviewed and updated    Patient Instructions (the written plan) was given to the patient.  Medicare Attestation  I have personally reviewed:  The patient's medical and social history  Their use of alcohol, tobacco or illicit drugs  Their current medications and supplements  The patient's functional ability including ADLs,fall risks, home safety risks, cognitive, and hearing and visual impairment  Diet and physical activities   Evidence for depression or mood disorders  The patient's weight, height, BMI, and visual acuity have been recorded in the chart. I have made referrals, counseling, and provided education to the patient based on review of the above and I have provided the patient with a written personalized care plan for preventive services.      Review of Systems     Objective:   Physical Exam        Assessment & Plan:  Routine general medical examination at a health care facility Annual exam as documented. Counseling done  re healthy lifestyle involving commitment to 150 minutes exercise per week, heart healthy diet, and attaining healthy weight.The importance of adequate sleep also discussed. Regular seat belt use and safe storage  of firearms if patient has them, is also discussed. Changes in health habits are decided on by the patient with goals and time frames  set for achieving them. Immunization and cancer screening needs are specifically addressed at this visit. Fall risk reduction discussed and information provided

## 2013-12-20 NOTE — Patient Instructions (Addendum)
F/u with rectal in mid September , call if you need me before   CBC, hBA1C, tSH and chem 7 in September, non fasting  Commit to daily exercise for health for 30 minutes every day  Fall Prevention and Home Safety Falls cause injuries and can affect all age groups. It is possible to prevent falls.  HOW TO PREVENT FALLS  Wear shoes with rubber soles that do not have an opening for your toes.  Keep the inside and outside of your house well lit.  Use night lights throughout your home.  Remove clutter from floors.  Clean up floor spills.  Remove throw rugs or fasten them to the floor with carpet tape.  Do not place electrical cords across pathways.  Put grab bars by your tub, shower, and toilet. Do not use towel bars as grab bars.  Put handrails on both sides of the stairway. Fix loose handrails.  Do not climb on stools or stepladders, if possible.  Do not wax your floors.  Repair uneven or unsafe sidewalks, walkways, or stairs.  Keep items you use a lot within reach.  Be aware of pets.  Keep emergency numbers next to the telephone.  Put smoke detectors in your home and near bedrooms. Ask your doctor what other things you can do to prevent falls. Document Released: 07/27/2009 Document Revised: 03/31/2012 Document Reviewed: 12/31/2011 [facility] Patient Information 2014 [place], [place].   You need the shingles vaccine

## 2014-05-11 ENCOUNTER — Other Ambulatory Visit: Payer: Self-pay

## 2014-05-11 MED ORDER — LEVOTHYROXINE SODIUM 75 MCG PO TABS: 75.0000 ug | ORAL_TABLET | Freq: Every day | ORAL | Status: DC

## 2014-06-23 ENCOUNTER — Ambulatory Visit: Payer: Medicare Other | Admitting: Family Medicine

## 2014-08-05 ENCOUNTER — Other Ambulatory Visit: Payer: Self-pay

## 2014-08-05 MED ORDER — OXYBUTYNIN CHLORIDE 5 MG PO TABS: ORAL_TABLET | ORAL | Status: DC

## 2014-08-05 MED ORDER — HYDROCHLOROTHIAZIDE 12.5 MG PO CAPS: 12.5000 mg | ORAL_CAPSULE | Freq: Every day | ORAL | Status: DC

## 2014-08-31 ENCOUNTER — Other Ambulatory Visit: Payer: Self-pay | Admitting: Family Medicine

## 2014-08-31 LAB — CBC WITH DIFFERENTIAL/PLATELET
Basophils Absolute: 0.1 10*3/uL (ref 0.0–0.1)
Basophils Relative: 1 % (ref 0–1)
EOS PCT: 5 % (ref 0–5)
Eosinophils Absolute: 0.3 10*3/uL (ref 0.0–0.7)
HCT: 46.4 % (ref 39.0–52.0)
HEMOGLOBIN: 15.4 g/dL (ref 13.0–17.0)
LYMPHS ABS: 1.4 10*3/uL (ref 0.7–4.0)
LYMPHS PCT: 26 % (ref 12–46)
MCH: 27.4 pg (ref 26.0–34.0)
MCHC: 33.2 g/dL (ref 30.0–36.0)
MCV: 82.4 fL (ref 78.0–100.0)
MONO ABS: 0.4 10*3/uL (ref 0.1–1.0)
MPV: 9.9 fL (ref 9.4–12.4)
Monocytes Relative: 8 % (ref 3–12)
Neutro Abs: 3.2 10*3/uL (ref 1.7–7.7)
Neutrophils Relative %: 60 % (ref 43–77)
PLATELETS: 240 10*3/uL (ref 150–400)
RBC: 5.63 MIL/uL (ref 4.22–5.81)
RDW: 14.8 % (ref 11.5–15.5)
WBC: 5.4 10*3/uL (ref 4.0–10.5)

## 2014-08-31 LAB — BASIC METABOLIC PANEL
BUN: 11 mg/dL (ref 6–23)
CHLORIDE: 103 meq/L (ref 96–112)
CO2: 29 meq/L (ref 19–32)
Calcium: 9.6 mg/dL (ref 8.4–10.5)
Creat: 1.1 mg/dL (ref 0.50–1.35)
GLUCOSE: 107 mg/dL — AB (ref 70–99)
Potassium: 4.3 mEq/L (ref 3.5–5.3)
Sodium: 141 mEq/L (ref 135–145)

## 2014-08-31 LAB — HEMOGLOBIN A1C
Hgb A1c MFr Bld: 6.5 % — ABNORMAL HIGH (ref <5.7)
Mean Plasma Glucose: 140 mg/dL — ABNORMAL HIGH (ref <117)

## 2014-08-31 LAB — TSH: TSH: 2.833 u[IU]/mL (ref 0.350–4.500)

## 2014-09-05 ENCOUNTER — Encounter: Payer: Self-pay | Admitting: Family Medicine

## 2014-09-05 ENCOUNTER — Ambulatory Visit (INDEPENDENT_AMBULATORY_CARE_PROVIDER_SITE_OTHER): Payer: Medicare Other | Admitting: Family Medicine

## 2014-09-05 VITALS — BP 130/76 | HR 88 | Resp 18 | Ht 68.0 in | Wt 163.1 lb

## 2014-09-05 DIAGNOSIS — F528 Other sexual dysfunction not due to a substance or known physiological condition: Secondary | ICD-10-CM

## 2014-09-05 DIAGNOSIS — E039 Hypothyroidism, unspecified: Secondary | ICD-10-CM

## 2014-09-05 DIAGNOSIS — R7309 Other abnormal glucose: Secondary | ICD-10-CM

## 2014-09-05 DIAGNOSIS — Z23 Encounter for immunization: Secondary | ICD-10-CM | POA: Insufficient documentation

## 2014-09-05 DIAGNOSIS — J301 Allergic rhinitis due to pollen: Secondary | ICD-10-CM

## 2014-09-05 DIAGNOSIS — Z1211 Encounter for screening for malignant neoplasm of colon: Secondary | ICD-10-CM | POA: Insufficient documentation

## 2014-09-05 DIAGNOSIS — I10 Essential (primary) hypertension: Secondary | ICD-10-CM

## 2014-09-05 DIAGNOSIS — R7303 Prediabetes: Secondary | ICD-10-CM

## 2014-09-05 DIAGNOSIS — N39498 Other specified urinary incontinence: Secondary | ICD-10-CM

## 2014-09-05 DIAGNOSIS — E785 Hyperlipidemia, unspecified: Secondary | ICD-10-CM

## 2014-09-05 LAB — LIPID PANEL
CHOL/HDL RATIO: 2.2 ratio
CHOLESTEROL: 127 mg/dL (ref 0–200)
HDL: 59 mg/dL (ref >39)
LDL CALC: 53 mg/dL (ref 0–99)
Triglycerides: 76 mg/dL (ref <150)
VLDL: 15 mg/dL (ref 0–40)

## 2014-09-05 LAB — POC HEMOCCULT BLD/STL (OFFICE/1-CARD/DIAGNOSTIC): Fecal Occult Blood, POC: POSITIVE

## 2014-09-05 NOTE — Assessment & Plan Note (Signed)
Ongoing problem script given to be filled at [facility] for troches if he can afford them

## 2014-09-05 NOTE — Assessment & Plan Note (Signed)
Controlled, no change in medication  

## 2014-09-05 NOTE — Patient Instructions (Addendum)
Annual wellness in 4 month, call if you need me before  Rectal exam today and script for viagra to be filled at [facility] back on sweet tea and sweets your blood sugar has increased  HBa1C, chem 7 and EGFr and tSH in 4 month  Prevnar today  Next colonoscopy is due in 2017, petr GI report in 2014, you still  have hidden blood in your stool,

## 2014-09-05 NOTE — Assessment & Plan Note (Signed)
No mass, heme positive stool No change in BM, colonoscopy in 2014, diverticulosis, needs rept in 2017, will monitor symptoms and weight

## 2014-09-05 NOTE — Assessment & Plan Note (Signed)
Vaccine administered at visit.  

## 2014-09-05 NOTE — Assessment & Plan Note (Signed)
Controlled, no change in medication DASH diet and commitment to daily physical activity for a minimum of 30 minutes discussed and encouraged, as a part of hypertension management. The importance of attaining a healthy weight is also discussed.  

## 2014-09-05 NOTE — Assessment & Plan Note (Signed)
Controlled with medcation

## 2014-09-05 NOTE — Assessment & Plan Note (Signed)
Deteriorated Patient educated about the importance of limiting  Carbohydrate intake , the need to commit to daily physical activity for a minimum of 30 minutes , and to commit weight loss. The fact that changes in all these areas will reduce or eliminate all together the development of diabetes is stressed.   \Updated lab needed at/ before next visit.  

## 2014-09-05 NOTE — Assessment & Plan Note (Signed)
Hyperlipidemia:Low fat diet discussed and encouraged.  Lab added

## 2014-09-05 NOTE — Progress Notes (Signed)
   Subjective:    Patient ID: [name], male    DOB: 31-Aug-1941, 73 y.o.   MRN: [id number]  HPI  The PT is here for follow up and re-evaluation of chronic medical conditions, medication management and review of any available recent lab and radiology data.  Preventive health is updated, specifically  Cancer screening and Immunization.   Questions or concerns regarding consultations or procedures which the PT has had in the interim are  addressed. The PT denies any adverse reactions to current medications since the last visit.  There are no new concerns.  There are no specific complaints      Review of Systems    See HPI Denies recent fever or chills. Denies sinus pressure, nasal congestion, ear pain or sore throat. Denies chest congestion, productive cough or wheezing. Denies chest pains, palpitations and leg swelling Denies abdominal pain, nausea, vomiting,diarrhea or constipation.   Denies dysuria, frequency, hesitancy or incontinence. Denies joint pain, swelling and limitation in mobility. Denies headaches, seizures, numbness, or tingling. Denies depression, anxiety or insomnia. Denies skin break down or rash.      Objective:   Physical Exam BP 130/76 mmHg  Pulse 88  Resp 18  Ht 5\' 8"  (1.727 m)  Wt 163 lb 1.3 oz (73.973 kg)  BMI 24.80 kg/m2  SpO2 99% Patient alert and oriented and in no cardiopulmonary distress.  HEENT: No facial asymmetry, EOMI,   oropharynx pink and moist.  Neck supple no JVD, no mass.  Chest: Clear to auscultation bilaterally.  CVS: S1, S2 no murmurs, no S3.Regular rate.  ABD: Soft non tender. No organomegaly or mass Rectal: no mass, heme positive stool  Ext: No edema  MS: Adequate ROM spine, shoulders, hips and knees.  Skin: Intact, no ulcerations or rash noted.  Psych: Good eye contact, normal affect. Memory intact not anxious or depressed appearing.  CNS: CN 2-12 intact, power,  normal throughout.no focal deficits  noted.        Assessment & Plan:  Essential hypertension Controlled, no change in medication DASH diet and commitment to daily physical activity for a minimum of 30 minutes discussed and encouraged, as a part of hypertension management. The importance of attaining a healthy weight is also discussed.   ALLERGIC RHINITIS, SEASONAL Controlled, no change in medication   Hypothyroidism Controlled, no change in medication   Prediabetes Deteriorated Patient educated about the importance of limiting  Carbohydrate intake , the need to commit to daily physical activity for a minimum of 30 minutes , and to commit weight loss. The fact that changes in all these areas will reduce or eliminate all together the development of diabetes is stressed.   Updated lab needed at/ before next visit.   ERECTILE DYSFUNCTION Ongoing problem script given to be filled at [facility] for troches if he can afford them  Hyperlipidemia LDL goal <100 Hyperlipidemia:Low fat diet discussed and encouraged.  Lab added  Need for vaccination with 13-polyvalent pneumococcal conjugate vaccine Vaccine administered at visit.   OTHER URINARY INCONTINENCE Controlled with medcation  Special screening for malignant neoplasms, colon No mass, heme positive stool No change in BM, colonoscopy in 2014, diverticulosis, needs rept in 2017, will monitor symptoms and weight

## 2014-11-02 ENCOUNTER — Other Ambulatory Visit: Payer: Self-pay

## 2014-11-02 DIAGNOSIS — E785 Hyperlipidemia, unspecified: Secondary | ICD-10-CM

## 2014-11-02 MED ORDER — POTASSIUM CHLORIDE CRYS ER 20 MEQ PO TBCR: 20.0000 meq | EXTENDED_RELEASE_TABLET | Freq: Every day | ORAL | Status: DC

## 2014-11-02 MED ORDER — LOVASTATIN 40 MG PO TABS: 40.0000 mg | ORAL_TABLET | Freq: Every day | ORAL | Status: DC

## 2014-11-02 MED ORDER — OXYBUTYNIN CHLORIDE 5 MG PO TABS: ORAL_TABLET | ORAL | Status: DC

## 2014-11-02 MED ORDER — HYDROCHLOROTHIAZIDE 12.5 MG PO CAPS: 12.5000 mg | ORAL_CAPSULE | Freq: Every day | ORAL | Status: DC

## 2014-11-02 MED ORDER — AMLODIPINE BESYLATE 10 MG PO TABS: 10.0000 mg | ORAL_TABLET | Freq: Every day | ORAL | Status: DC

## 2014-11-02 MED ORDER — LEVOTHYROXINE SODIUM 75 MCG PO TABS: 75.0000 ug | ORAL_TABLET | Freq: Every day | ORAL | Status: DC

## 2014-12-20 ENCOUNTER — Other Ambulatory Visit: Payer: Self-pay | Admitting: Urology

## 2014-12-20 ENCOUNTER — Ambulatory Visit (INDEPENDENT_AMBULATORY_CARE_PROVIDER_SITE_OTHER): Payer: Medicare Other | Admitting: Urology

## 2014-12-20 DIAGNOSIS — N200 Calculus of kidney: Secondary | ICD-10-CM | POA: Diagnosis not present

## 2014-12-20 DIAGNOSIS — C61 Malignant neoplasm of prostate: Secondary | ICD-10-CM | POA: Diagnosis not present

## 2014-12-20 DIAGNOSIS — N5201 Erectile dysfunction due to arterial insufficiency: Secondary | ICD-10-CM | POA: Diagnosis not present

## 2014-12-20 DIAGNOSIS — N390 Urinary tract infection, site not specified: Secondary | ICD-10-CM | POA: Diagnosis not present

## 2015-01-03 LAB — BASIC METABOLIC PANEL WITH GFR
BUN: 14 mg/dL (ref 6–23)
CHLORIDE: 102 meq/L (ref 96–112)
CO2: 30 meq/L (ref 19–32)
CREATININE: 1.21 mg/dL (ref 0.50–1.35)
Calcium: 9.6 mg/dL (ref 8.4–10.5)
GFR, Est African American: 67 mL/min
GFR, Est Non African American: 58 mL/min — ABNORMAL LOW
Glucose, Bld: 104 mg/dL — ABNORMAL HIGH (ref 70–99)
Potassium: 4.3 mEq/L (ref 3.5–5.3)
Sodium: 140 mEq/L (ref 135–145)

## 2015-01-03 LAB — HEMOGLOBIN A1C
Hgb A1c MFr Bld: 6.4 % — ABNORMAL HIGH (ref <5.7)
MEAN PLASMA GLUCOSE: 137 mg/dL — AB (ref <117)

## 2015-01-03 LAB — TSH: TSH: 3.974 u[IU]/mL (ref 0.350–4.500)

## 2015-01-05 ENCOUNTER — Encounter: Payer: Self-pay | Admitting: Family Medicine

## 2015-01-05 ENCOUNTER — Ambulatory Visit (INDEPENDENT_AMBULATORY_CARE_PROVIDER_SITE_OTHER): Payer: Medicare Other | Admitting: Family Medicine

## 2015-01-05 VITALS — BP 124/84 | HR 91 | Resp 16 | Ht 68.0 in | Wt 164.0 lb

## 2015-01-05 DIAGNOSIS — R7309 Other abnormal glucose: Secondary | ICD-10-CM

## 2015-01-05 DIAGNOSIS — I10 Essential (primary) hypertension: Secondary | ICD-10-CM

## 2015-01-05 DIAGNOSIS — R7303 Prediabetes: Secondary | ICD-10-CM

## 2015-01-05 DIAGNOSIS — E039 Hypothyroidism, unspecified: Secondary | ICD-10-CM

## 2015-01-05 DIAGNOSIS — Z0000 Encounter for general adult medical examination without abnormal findings: Secondary | ICD-10-CM

## 2015-01-05 DIAGNOSIS — E785 Hyperlipidemia, unspecified: Secondary | ICD-10-CM

## 2015-01-05 DIAGNOSIS — F528 Other sexual dysfunction not due to a substance or known physiological condition: Secondary | ICD-10-CM

## 2015-01-05 NOTE — Patient Instructions (Signed)
Annual physical exam in  5 month, call if you need me before  Continue excellent health habits   No med changes  Fasting lipid, cmp and EGFr, hBa1C, tSH in 5 month

## 2015-01-05 NOTE — Progress Notes (Signed)
Subjective:    Patient ID: [name], male    DOB: 06-Sep-1941, 74 y.o.   MRN: [id number]  HPI Preventive Screening-Counseling & Management   Patient present here today for a Medicare annual wellness visit.   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 No  Over the past two weeks, have you felt little interest or pleasure in doing things? No  Have you lost interest or pleasure in daily life? No  Do you often feel hopeless? No  Do you cry easily over simple problems? No   Activities of Daily Living  In your present state of health, do you have any difficulty performing the following activities?  Driving?: No Managing money?: No Feeding yourself?:No Getting from bed to chair?:No Climbing a flight of stairs?:No Preparing food and eating?:No Bathing or showering?:No Getting dressed?:No Getting to the toilet?:No Using the toilet?:No Moving around from place to place?: No  Fall Risk Assessment In the past year have you fallen or had a near fall?:No Are you currently taking any medications that make you dizzy?:No   Hearing Difficulties: No Do you often ask people to speak up or repeat themselves?:No Do you experience ringing or noises in your ears?:No Do you have difficulty understanding soft or whispered voices?:No  Cognitive Testing  Alert? Yes Normal  Appearance?Yes  Oriented to person? Yes Place? Yes  Time? Yes  Displays appropriate judgment?Yes  Can read the correct time from a watch face? yes Are you having problems remembering things?No  Advanced Directives have been discussed with the patient?Yes    List the Names of Other Physician/Practitioners you currently use:    Indicate any recent Medical Services you may have received from other than [facility] providers in the past year (date may be approximate).   Assessment:    Annual Wellness Exam   Plan:     Medicare Attestation  I have personally reviewed:  The patient's medical and social history  Their use of alcohol, tobacco or illicit drugs  Their current medications and supplements  The patient's functional ability including ADLs,fall risks, home safety risks, cognitive, and hearing and visual impairment  Diet and physical activities  Evidence for depression or mood disorders  The patient's weight, height, BMI, and visual acuity have been recorded in the chart. I have made referrals, counseling, and provided education to the patient based on review of the above and I have provided the patient with a written personalized care plan for preventive services.      Review of Systems     Objective:   Physical Exam  BP 124/84 mmHg  Pulse 91  Resp 16  Ht 5\' 8"  (1.727 m)  Wt 164 lb (74.39 kg)  BMI 24.94 kg/m2  SpO2 97%       Assessment & Plan:  Medicare annual wellness visit, subsequent Annual exam as documented. Counseling done  re  healthy lifestyle involving commitment to 150 minutes exercise per week, heart healthy diet, and attaining healthy weight.The importance of adequate sleep also discussed. Regular seat belt use and home safety, is also discussed. Changes in health habits are decided on by the patient with goals and time frames  set for achieving them. Immunization and cancer screening needs are specifically addressed at this visit.

## 2015-01-07 NOTE — Assessment & Plan Note (Signed)

## 2015-01-10 ENCOUNTER — Telehealth: Payer: Self-pay

## 2015-01-10 DIAGNOSIS — Z87438 Personal history of other diseases of male genital organs: Secondary | ICD-10-CM | POA: Insufficient documentation

## 2015-01-10 MED ORDER — SILDENAFIL CITRATE 20 MG PO TABS: ORAL_TABLET | ORAL | Status: DC

## 2015-01-10 NOTE — Telephone Encounter (Signed)
Is wanting something for ED. Per Dr Moshe Cipro, I will send viagra 20 5 tabs daily to see if covered.

## 2015-01-17 ENCOUNTER — Ambulatory Visit (HOSPITAL_COMMUNITY)
Admission: RE | Admit: 2015-01-17 | Discharge: 2015-01-17 | Disposition: A | Discharge status: Home / Self Care | Payer: Medicare Other | Source: Ambulatory Visit | Attending: Urology | Admitting: Urology

## 2015-01-17 DIAGNOSIS — N200 Calculus of kidney: Secondary | ICD-10-CM | POA: Insufficient documentation

## 2015-01-19 ENCOUNTER — Other Ambulatory Visit: Payer: Self-pay

## 2015-01-19 MED ORDER — SILDENAFIL CITRATE 20 MG PO TABS: ORAL_TABLET | ORAL | Status: DC

## 2015-04-24 ENCOUNTER — Other Ambulatory Visit: Payer: Self-pay | Admitting: Family Medicine

## 2015-06-07 ENCOUNTER — Ambulatory Visit (INDEPENDENT_AMBULATORY_CARE_PROVIDER_SITE_OTHER): Payer: Medicare Other | Admitting: Family Medicine

## 2015-06-07 ENCOUNTER — Encounter: Payer: Self-pay | Admitting: Family Medicine

## 2015-06-07 VITALS — BP 130/82 | HR 86 | Resp 16 | Ht 67.0 in | Wt 164.0 lb

## 2015-06-07 DIAGNOSIS — Z0000 Encounter for general adult medical examination without abnormal findings: Secondary | ICD-10-CM | POA: Insufficient documentation

## 2015-06-07 LAB — COMPLETE METABOLIC PANEL WITH GFR
ALT: 14 U/L (ref 9–46)
AST: 19 U/L (ref 10–35)
Albumin: 3.9 g/dL (ref 3.6–5.1)
Alkaline Phosphatase: 56 U/L (ref 40–115)
BILIRUBIN TOTAL: 0.8 mg/dL (ref 0.2–1.2)
BUN: 16 mg/dL (ref 7–25)
CHLORIDE: 104 mmol/L (ref 98–110)
CO2: 29 mmol/L (ref 20–31)
CREATININE: 1.14 mg/dL (ref 0.70–1.18)
Calcium: 9 mg/dL (ref 8.6–10.3)
GFR, EST AFRICAN AMERICAN: 72 mL/min (ref >=60)
GFR, Est Non African American: 62 mL/min (ref >=60)
Glucose, Bld: 107 mg/dL — ABNORMAL HIGH (ref 65–99)
Potassium: 4.3 mmol/L (ref 3.5–5.3)
Sodium: 141 mmol/L (ref 135–146)
TOTAL PROTEIN: 6.8 g/dL (ref 6.1–8.1)

## 2015-06-07 LAB — LIPID PANEL
CHOLESTEROL: 143 mg/dL (ref 125–200)
HDL: 54 mg/dL (ref >=40)
LDL CALC: 76 mg/dL (ref <130)
TRIGLYCERIDES: 66 mg/dL (ref <150)
Total CHOL/HDL Ratio: 2.6 Ratio (ref <=5.0)
VLDL: 13 mg/dL (ref <30)

## 2015-06-07 LAB — HEMOGLOBIN A1C
HEMOGLOBIN A1C: 6.4 % — AB (ref <5.7)
Mean Plasma Glucose: 137 mg/dL — ABNORMAL HIGH (ref <117)

## 2015-06-07 LAB — TSH: TSH: 2.606 u[IU]/mL (ref 0.350–4.500)

## 2015-06-07 NOTE — Progress Notes (Signed)
   Subjective:    Patient ID: [name], male    DOB: April 17, 1941, 74 y.o.   MRN: [id number]  HPI  Patient is in for annual physical exam. No other health concerns are expressed or addressed at the visit. Recent labs, if available are reviewed. Immunization is reviewed , and  updated if needed.   Review of Systems See HPI     Objective:   Physical Exam  BP 130/82 mmHg  Pulse 86  Resp 16  Ht 5\' 7"  (1.702 m)  Wt 164 lb (74.39 kg)  BMI 25.68 kg/m2  SpO2 97%   Pleasant well nourished male, alert and oriented x 3, in no cardio-pulmonary distress. Afebrile. HEENT No facial trauma or asymetry. Sinuses non tender. EOMI, PERTL, fundoscopic exam is negative for hemorhages or exudates. External ears normal, tympanic membranes clear. Oropharynx moist, no exudate, fair dentition. Neck: supple, no adenopathy,JVD or thyromegaly.No bruits.  Chest: Clear to ascultation bilaterally.No crackles or wheezes. Non tender to palpation Central surgical scar  Breast: No asymetry,no masses. No nipple discharge or inversion. No axillary or supraclavicular adenopathy  Cardiovascular system; Heart sounds normal,  S1 and  S2 ,no S3.  No murmur, or thrill. Apical beat not displaced Peripheral pulses normal.  Abdomen: Soft, non tender, no organomegaly or masses. No bruits. Bowel sounds normal. No guarding, tenderness or rebound.  Rectal:  Deferred, FOB testing next due in November  GU: Deferred, sees urology  Musculoskeletal exam: Full ROM of spine, hips , shoulders and knees. No deformity ,swelling or crepitus noted. No muscle wasting or atrophy.   Neurologic: Cranial nerves 2 to 12 intact. Power, tone ,sensation and reflexes normal throughout. No disturbance in gait. No tremor.  Skin: Intact, no ulceration, erythema , scaling or rash noted. Pigmentation normal throughout  Psych; Normal mood and affect. Judgement and concentration normal        Assessment &  Plan:  Annual physical exam Annual exam as documented. Counseling done  re healthy lifestyle involving commitment to 150 minutes exercise per week, heart healthy diet, and attaining healthy weight.The importance of adequate sleep also discussed. Regular seat belt use and home safety, is also discussed. Changes in health habits are decided on by the patient with goals and time frames  set for achieving them. Immunization and cancer screening needs are specifically addressed at this visit.

## 2015-06-07 NOTE — Assessment & Plan Note (Signed)

## 2015-06-07 NOTE — Patient Instructions (Addendum)
F/u with rectal first week in December  Excellent exam and labs, keep up great health habits  No change in meds  Viagra will be refilled  Thanks for choosing [facility], we consider it a privelige to serve you.

## 2015-06-13 ENCOUNTER — Telehealth: Payer: Self-pay | Admitting: *Deleted

## 2015-06-13 NOTE — Telephone Encounter (Signed)
Noted.  Will fax most recent lab results to Dr. Zannie Cove.

## 2015-06-13 NOTE — Telephone Encounter (Signed)
Pt called requesting his blood work to be faxed to Dr. Ruby Cola.

## 2015-06-27 ENCOUNTER — Ambulatory Visit (INDEPENDENT_AMBULATORY_CARE_PROVIDER_SITE_OTHER): Payer: Medicare Other | Admitting: Urology

## 2015-06-27 DIAGNOSIS — N390 Urinary tract infection, site not specified: Secondary | ICD-10-CM

## 2015-06-27 DIAGNOSIS — N5201 Erectile dysfunction due to arterial insufficiency: Secondary | ICD-10-CM

## 2015-06-27 DIAGNOSIS — C61 Malignant neoplasm of prostate: Secondary | ICD-10-CM | POA: Diagnosis not present

## 2015-06-27 DIAGNOSIS — N281 Cyst of kidney, acquired: Secondary | ICD-10-CM | POA: Diagnosis not present

## 2015-07-13 ENCOUNTER — Other Ambulatory Visit: Payer: Self-pay | Admitting: Family Medicine

## 2015-07-26 ENCOUNTER — Other Ambulatory Visit: Payer: Self-pay | Admitting: Family Medicine

## 2015-09-20 ENCOUNTER — Ambulatory Visit (INDEPENDENT_AMBULATORY_CARE_PROVIDER_SITE_OTHER): Payer: Medicare Other | Admitting: Family Medicine

## 2015-09-20 ENCOUNTER — Encounter: Payer: Self-pay | Admitting: Family Medicine

## 2015-09-20 VITALS — BP 130/82 | HR 84 | Resp 16 | Ht 67.0 in | Wt 163.0 lb

## 2015-09-20 DIAGNOSIS — E039 Hypothyroidism, unspecified: Secondary | ICD-10-CM | POA: Diagnosis not present

## 2015-09-20 DIAGNOSIS — Z1211 Encounter for screening for malignant neoplasm of colon: Secondary | ICD-10-CM | POA: Diagnosis not present

## 2015-09-20 DIAGNOSIS — I10 Essential (primary) hypertension: Secondary | ICD-10-CM | POA: Diagnosis not present

## 2015-09-20 DIAGNOSIS — E785 Hyperlipidemia, unspecified: Secondary | ICD-10-CM

## 2015-09-20 DIAGNOSIS — R7303 Prediabetes: Secondary | ICD-10-CM

## 2015-09-20 DIAGNOSIS — R194 Change in bowel habit: Secondary | ICD-10-CM | POA: Insufficient documentation

## 2015-09-20 LAB — HEMOCCULT GUIAC POC 1CARD (OFFICE): Fecal Occult Blood, POC: POSITIVE — AB

## 2015-09-20 MED ORDER — LOVASTATIN 40 MG PO TABS: 40.0000 mg | ORAL_TABLET | Freq: Every day | ORAL | Status: DC

## 2015-09-20 MED ORDER — POTASSIUM CHLORIDE CRYS ER 20 MEQ PO TBCR: 20.0000 meq | EXTENDED_RELEASE_TABLET | Freq: Every day | ORAL | Status: DC

## 2015-09-20 MED ORDER — HYDROCHLOROTHIAZIDE 12.5 MG PO CAPS: 12.5000 mg | ORAL_CAPSULE | Freq: Every day | ORAL | Status: DC

## 2015-09-20 MED ORDER — AMLODIPINE BESYLATE 10 MG PO TABS: 10.0000 mg | ORAL_TABLET | Freq: Every day | ORAL | Status: DC

## 2015-09-20 MED ORDER — LEVOTHYROXINE SODIUM 75 MCG PO TABS: ORAL_TABLET | ORAL | Status: DC

## 2015-09-20 MED ORDER — OXYBUTYNIN CHLORIDE 5 MG PO TABS: ORAL_TABLET | ORAL | Status: DC

## 2015-09-20 NOTE — Progress Notes (Signed)
   Subjective:    Patient ID: [name], male    DOB: 1941-03-18, 74 y.o.   MRN: [id number]  HPI   [name]     MRN: [id number]      DOB: 1941/06/11   HPI [name] is here for follow up and re-evaluation of chronic medical conditions, medication management and review of any available recent lab and radiology data.  Preventive health is updated, specifically  Cancer screening and Immunization.   Questions or concerns regarding consultations or procedures which the PT has had in the interim are  addressed. The PT denies any adverse reactions to current medications since the last visit.  Loose tooth for 1 month, negatively affecting his ability to enjoy food, unable to afford dental care. C/o poor appetite  nd recent change in stool habits  ROS Denies recent fever or chills. Denies sinus pressure, nasal congestion, ear pain or sore throat. Denies chest congestion, productive cough or wheezing. Denies chest pains, palpitations and leg swelling  Denies dysuria, frequency, hesitancy or incontinence. Denies uncontrolled  joint pain, swelling and limitation in mobility. Denies headaches, seizures, numbness, or tingling. Denies depression, anxiety or insomnia. Denies skin break down or rash.   PE  BP 114/74 mmHg  Pulse 84  Resp 16  Ht 5\' 7"  (1.702 m)  Wt 163 lb (73.936 kg)  BMI 25.52 kg/m2  SpO2 97%  Patient alert and oriented and in no cardiopulmonary distress.  HEENT: No facial asymmetry, EOMI,   oropharynx pink and moist.  Neck supple no JVD, no mass. Very poor dentition Chest: Clear to auscultation bilaterally.  CVS: S1, S2 no murmurs, no S3.Regular rate.  ABD: Soft non tender. No organomegaly or mass, normal BS Rectal: no mass , heme positive stool  Ext: No edema  MS: Adequate ROM spine, shoulders, hips and knees.  Skin: Intact, no ulcerations or rash noted.  Psych: Good eye contact, normal affect. Memory intact not anxious or depressed  appearing.  CNS: CN 2-12 intact, power,  normal throughout.no focal deficits noted.   Assessment & Plan   Essential hypertension Controlled, no change in medication DASH diet and commitment to daily physical activity for a minimum of 30 minutes discussed and encouraged, as a part of hypertension management. The importance of attaining a healthy weight is also discussed.  BP/Weight 10/03/2015 09/20/2015 06/07/2015 01/05/2015 09/05/2014 12/20/2013 [id number]  Systolic BP [id number]  Diastolic BP 70 82 82 84 76 84 85  Wt. (Lbs) 162.6 163 164 164 163.08 164.4 161  BMI 25.08 25.52 25.68 24.94 24.8 25.35 24.83        Hypothyroidism Controlled, no change in medication   Change in stool habits Reports recent change in bowel habits with reduced appeitite, stool is heme positive on exam at this visit , will refer to Gi for eval  Hyperlipidemia LDL goal <100 Controlled, no change in medication Hyperlipidemia:Low fat diet discussed and encouraged.   Lipid Panel  Lab Results  Component Value Date   CHOL 143 06/06/2015   HDL 54 06/06/2015   LDLCALC 76 06/06/2015   TRIG 66 06/06/2015   CHOLHDL 2.6 06/06/2015   Updated lab needed at/ before next visit.          Review of Systems     Objective:   Physical Exam        Assessment & Plan:

## 2015-09-20 NOTE — Patient Instructions (Addendum)
Annual wellness March 25 or after, call if you need me sooner  Fasting lipid, cmp and EGFR, hBA1C, TSH , CBC Feb 28 or after   Check the health department for dental services  You are referred to Dr Laural Golden re change in bowels and poor appetite , with hidden blood in stool  Thanks for choosing [facility], we consider it a privelige to serve you.

## 2015-09-25 ENCOUNTER — Encounter (INDEPENDENT_AMBULATORY_CARE_PROVIDER_SITE_OTHER): Payer: Self-pay | Admitting: *Deleted

## 2015-10-03 ENCOUNTER — Encounter (INDEPENDENT_AMBULATORY_CARE_PROVIDER_SITE_OTHER): Payer: Self-pay | Admitting: Internal Medicine

## 2015-10-03 ENCOUNTER — Ambulatory Visit (INDEPENDENT_AMBULATORY_CARE_PROVIDER_SITE_OTHER): Payer: Medicare Other | Admitting: Internal Medicine

## 2015-10-03 ENCOUNTER — Telehealth (INDEPENDENT_AMBULATORY_CARE_PROVIDER_SITE_OTHER): Payer: Self-pay | Admitting: *Deleted

## 2015-10-03 ENCOUNTER — Other Ambulatory Visit (INDEPENDENT_AMBULATORY_CARE_PROVIDER_SITE_OTHER): Payer: Self-pay | Admitting: Internal Medicine

## 2015-10-03 VITALS — BP 134/70 | HR 68 | Temp 98.5°F | Resp 18 | Ht 67.5 in | Wt 162.6 lb

## 2015-10-03 DIAGNOSIS — Z860100 Personal history of colon polyps, unspecified: Secondary | ICD-10-CM

## 2015-10-03 DIAGNOSIS — R195 Other fecal abnormalities: Secondary | ICD-10-CM | POA: Diagnosis not present

## 2015-10-03 DIAGNOSIS — Z1211 Encounter for screening for malignant neoplasm of colon: Secondary | ICD-10-CM

## 2015-10-03 LAB — CBC
HCT: 49 % (ref 39.0–52.0)
Hemoglobin: 15.7 g/dL (ref 13.0–17.0)
MCH: 26.7 pg (ref 26.0–34.0)
MCHC: 32 g/dL (ref 30.0–36.0)
MCV: 83.5 fL (ref 78.0–100.0)
MPV: 10.2 fL (ref 8.6–12.4)
PLATELETS: 190 10*3/uL (ref 150–400)
RBC: 5.87 MIL/uL — AB (ref 4.22–5.81)
RDW: 14.7 % (ref 11.5–15.5)
WBC: 4.8 10*3/uL (ref 4.0–10.5)

## 2015-10-03 NOTE — Progress Notes (Signed)
Presenting complaint;  Heme positive stool and change in bowel habits.  History of present illness:  Patient is 74 year old African-American male who is here for scheduled visit. He was last seen in November 2014 for heme positive stool and underwent colonoscopy and found to have a 20 mm broad-based polyp at hepatic flexure as well as 8 mm polyp at hepatic flexure. Both of these polyps are removed and turned to be tubular adenomas. Patient was advised to return for surveillance colonoscopy in 3 years which would've been November 2017. He was having problems with one of his left upper molars and was not able to eat. He also noted change in his bowel habits. He used to have 1-2 bowel movements a week and he started to have bowel movement at least every other day. He also has noted blood on wiping. He has not had frank bleeding. He was seen by Dr. Moshe Cipro and noted to have heme positive stool. He has not had a recent CBC. He states his appetite has improved since he had molar removed now he has a loose upper incisor. He denies recent weight loss. He also denies abdominal pain or melena. He denies heartburn nausea vomiting or dysphagia. He stays busy and he walks on a treadmill at least 6 times a week. He is on low-dose aspirin but does not take other OTC NSAIDs.    Current Medications: Outpatient Encounter Prescriptions as of 10/03/2015  Medication Sig  . amLODipine (NORVASC) 10 MG tablet Take 1 tablet (10 mg total) by mouth daily. for blood pressure  . aspirin EC 81 MG tablet Take 1 tablet (81 mg total) by mouth daily.  . cetirizine (ZYRTEC ALLERGY) 10 MG tablet Take 10 mg by mouth daily. One by mouth once daily for allergies   . hydrochlorothiazide (MICROZIDE) 12.5 MG capsule Take 1 capsule (12.5 mg total) by mouth daily.  Marland Kitchen levothyroxine (SYNTHROID, LEVOTHROID) 75 MCG tablet TAKE 1 TABLET BY MOUTH DAILY FOR THYROID  . lovastatin (MEVACOR) 40 MG tablet Take 1 tablet (40 mg total) by mouth daily.   . Multiple Vitamin (MULTIVITAMIN WITH MINERALS) TABS tablet Take 1 tablet by mouth daily.  Marland Kitchen oxybutynin (DITROPAN) 5 MG tablet TAKE 1 TABLET BY MOUTH DAILY FOR URINARY INCONTINENCE  . potassium chloride SA (K-DUR,KLOR-CON) 20 MEQ tablet Take 1 tablet (20 mEq total) by mouth daily.   No facility-administered encounter medications on file as of 10/03/2015.   Past Medical History  Diagnosis Date  . Allergic rhinitis, seasonal   . Erectile dysfunction   . Hypothyroidism   . Hyperlipidemia   . Hypertension   . BPH (benign prostatic hypertrophy) he had TURP in June 2002.    Marland Kitchen Hx of thyroidectomy for large retrosternal goiter.    . Prostate cancer ([place]) 2011    treated with radiation  . Urosepsis 07/2011    ICU admission [facility]   Past Surgical History  Procedure Laterality Date  . Thyroidectomy  2003  . Transurethral resection of thr prostate  2003  . Ureteral stone extraction  08/2011  . Prostate surgery    . Colonoscopy N/A 09/03/2013    Procedure: COLONOSCOPY;  Surgeon: Rogene Houston, MD;  Location: [facility];  Service: Endoscopy;  Laterality: N/A;  1225   Allergies; Allergies  Allergen Reactions  . Peanut-Containing Drug Products Shortness Of Breath and Swelling  . Eggs Or Egg-Derived Products Swelling  . Iodine Swelling  . Shellfish Allergy Swelling  . Penicillins Hives and Rash   Family history:  Father was diabetic and died at 71. Mother died of brain hemorrhage at age 81. He has 2 sisters in good health. 2 brothers are deceased and 5 brothers are living. One brother died in service while he was in his early 45s another brother was diabetic and died in his 84s.   Social history: He is married and accompanied by his wife today. They have 5 children; 3 daughters and one son are in good health. One son sustained head injury when he was in middle school(hit by a swing) and now goes to mental health clinic. He is not retired. 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 He does not smoke cigarettes or drink alcohol.    Physical examination: Blood pressure 134/70, pulse 68, temperature 98.5 F (36.9 C), temperature source Oral, resp. rate 18, height 5' 7.5" (1.715 m), weight 162 lb 9.6 oz (73.755 kg). Patient is alert and in no acute distress. Conjunctiva is pink. Sclera is nonicteric Oropharyngeal mucosa is normal. No neck masses or thyromegaly noted. Cardiac exam with regular rhythm normal S1 and S2. No murmur or gallop noted. Lungs are clear to auscultation. Abdomen is symmetrical and soft without tenderness organomegaly or masses. Rectal examination deferred. No LE edema or clubbing noted.  Labs/studies Results: Last CBC is from 08/31/2014  WBC 5.4, H&H 15.4 and 46.4 and platelet count 240K    Assessment:  #1. Heme positive stool and recent change in bowel bowel habits. He has history of colonic polyps. Last colonoscopy was in November 2014 with removal of 20 mm and 8 mm tubular adenoma from hepatic flexure. He was advised to undergo repeat exam in 3 years but with history of heme positive stool and hematochezia lower GI tract need to be evaluated now. He does not have alarm symptoms. Change in bowel habits most likely due to change in his eating habits secondary to dental problems. He is doing better since he had one of left upper molar removed and now he has loose upper incisor which needs to be addressed.    Plan:  Patient will go to the lab for CBC. Diagnostic colonoscopy to be performed in the future. Patient will follow-up with his dentist ASAP regarding loose upper incisor.

## 2015-10-03 NOTE — Patient Instructions (Signed)
Patient will call with results of blood work when completed. Colonoscopy to be scheduled.

## 2015-10-03 NOTE — Telephone Encounter (Signed)
Patient needs trilyte 

## 2015-10-05 ENCOUNTER — Telehealth (INDEPENDENT_AMBULATORY_CARE_PROVIDER_SITE_OTHER): Payer: Self-pay | Admitting: *Deleted

## 2015-10-05 NOTE — Telephone Encounter (Signed)
Rec'd a call from the patient,who stated that the message that Dr.[name] left on the voicemail was unclear. Patient was called and another voice message was left ,stating that the lab,CBC, was normal.

## 2015-10-09 NOTE — Assessment & Plan Note (Signed)
Reports recent change in bowel habits with reduced appeitite, stool is heme positive on exam at this visit , will refer to Gi for eval

## 2015-10-09 NOTE — Assessment & Plan Note (Signed)
Controlled, no change in medication  

## 2015-10-09 NOTE — Assessment & Plan Note (Signed)
Controlled, no change in medication DASH diet and commitment to daily physical activity for a minimum of 30 minutes discussed and encouraged, as a part of hypertension management. The importance of attaining a healthy weight is also discussed.  BP/Weight 10/03/2015 09/20/2015 06/07/2015 01/05/2015 09/05/2014 12/20/2013 [id number]  Systolic BP [id number] [id number]  Diastolic BP 70 82 82 84 76 84 85  Wt. (Lbs) 162.6 163 164 164 163.08 164.4 161  BMI 25.08 25.52 25.68 24.94 24.8 25.35 24.83

## 2015-10-09 NOTE — Assessment & Plan Note (Signed)
Controlled, no change in medication Hyperlipidemia:Low fat diet discussed and encouraged.   Lipid Panel  Lab Results  Component Value Date   CHOL 143 06/06/2015   HDL 54 06/06/2015   LDLCALC 76 06/06/2015   TRIG 66 06/06/2015   CHOLHDL 2.6 06/06/2015   Updated lab needed at/ before next visit.

## 2015-10-10 MED ORDER — PEG 3350-KCL-NA BICARB-NACL 420 G PO SOLR: 4000.0000 mL | Freq: Once | ORAL | Status: DC

## 2015-11-08 ENCOUNTER — Encounter (HOSPITAL_COMMUNITY)
Admission: RE | Disposition: A | Discharge status: Home / Self Care | Payer: Self-pay | Source: Ambulatory Visit | Attending: Internal Medicine

## 2015-11-08 ENCOUNTER — Ambulatory Visit (HOSPITAL_COMMUNITY)
Admission: RE | Admit: 2015-11-08 | Discharge: 2015-11-08 | Disposition: A | Discharge status: Home / Self Care | Payer: PPO | Source: Ambulatory Visit | Attending: Internal Medicine | Admitting: Internal Medicine

## 2015-11-08 DIAGNOSIS — Z8546 Personal history of malignant neoplasm of prostate: Secondary | ICD-10-CM | POA: Diagnosis not present

## 2015-11-08 DIAGNOSIS — Z79899 Other long term (current) drug therapy: Secondary | ICD-10-CM | POA: Insufficient documentation

## 2015-11-08 DIAGNOSIS — K5730 Diverticulosis of large intestine without perforation or abscess without bleeding: Secondary | ICD-10-CM | POA: Insufficient documentation

## 2015-11-08 DIAGNOSIS — Z860100 Personal history of colon polyps, unspecified: Secondary | ICD-10-CM

## 2015-11-08 DIAGNOSIS — K5521 Angiodysplasia of colon with hemorrhage: Secondary | ICD-10-CM | POA: Diagnosis not present

## 2015-11-08 DIAGNOSIS — K625 Hemorrhage of anus and rectum: Secondary | ICD-10-CM | POA: Diagnosis not present

## 2015-11-08 DIAGNOSIS — I10 Essential (primary) hypertension: Secondary | ICD-10-CM | POA: Diagnosis not present

## 2015-11-08 DIAGNOSIS — K648 Other hemorrhoids: Secondary | ICD-10-CM | POA: Diagnosis not present

## 2015-11-08 DIAGNOSIS — K6289 Other specified diseases of anus and rectum: Secondary | ICD-10-CM | POA: Diagnosis not present

## 2015-11-08 DIAGNOSIS — K644 Residual hemorrhoidal skin tags: Secondary | ICD-10-CM | POA: Insufficient documentation

## 2015-11-08 DIAGNOSIS — Z7982 Long term (current) use of aspirin: Secondary | ICD-10-CM | POA: Insufficient documentation

## 2015-11-08 DIAGNOSIS — D125 Benign neoplasm of sigmoid colon: Secondary | ICD-10-CM | POA: Insufficient documentation

## 2015-11-08 DIAGNOSIS — I781 Nevus, non-neoplastic: Secondary | ICD-10-CM | POA: Insufficient documentation

## 2015-11-08 DIAGNOSIS — E785 Hyperlipidemia, unspecified: Secondary | ICD-10-CM | POA: Insufficient documentation

## 2015-11-08 DIAGNOSIS — R195 Other fecal abnormalities: Secondary | ICD-10-CM

## 2015-11-08 DIAGNOSIS — E039 Hypothyroidism, unspecified: Secondary | ICD-10-CM | POA: Diagnosis not present

## 2015-11-08 DIAGNOSIS — Z880 Allergy status to penicillin: Secondary | ICD-10-CM | POA: Insufficient documentation

## 2015-11-08 HISTORY — PX: COLONOSCOPY: SHX5424

## 2015-11-08 SURGERY — COLONOSCOPY
Anesthesia: Moderate Sedation

## 2015-11-08 MED ORDER — BENEFIBER DRINK MIX PO PACK: 4.0000 g | PACK | Freq: Every day | ORAL | Status: DC

## 2015-11-08 MED ORDER — STERILE WATER FOR IRRIGATION IR SOLN
Status: DC | PRN
  Administered 2015-11-08: 12:00:00

## 2015-11-08 MED ORDER — MEPERIDINE HCL 50 MG/ML IJ SOLN
INTRAMUSCULAR | Status: AC
  Filled 2015-11-08: qty 1

## 2015-11-08 MED ORDER — SODIUM CHLORIDE 0.9 % IV SOLN
INTRAVENOUS | Status: DC
  Administered 2015-11-08: 1000 mL via INTRAVENOUS

## 2015-11-08 MED ORDER — MIDAZOLAM HCL 5 MG/5ML IJ SOLN
INTRAMUSCULAR | Status: AC
  Filled 2015-11-08: qty 10

## 2015-11-08 MED ORDER — MEPERIDINE HCL 50 MG/ML IJ SOLN
INTRAMUSCULAR | Status: DC | PRN
  Administered 2015-11-08 (×2): 25 mg via INTRAVENOUS

## 2015-11-08 MED ORDER — MIDAZOLAM HCL 5 MG/5ML IJ SOLN
INTRAMUSCULAR | Status: DC | PRN
  Administered 2015-11-08: 2 mg via INTRAVENOUS
  Administered 2015-11-08 (×2): 1 mg via INTRAVENOUS

## 2015-11-08 NOTE — H&P (Signed)
[name] is an 75 y.o. male.   Chief Complaint:  Patient is here for colonoscopy. HPI:  Asian is 75 year old African male was history of colonic adenomas. Last colonoscopy was in November 2014 with removal of 20 mm broad-based polyp from hepatic flexure along with another 8 mm tubular adenoma. Patient was to return for repeat exam in 3 years which would have been in November this year. However he was noted having positive stool. She also has noted change in his bowel habits. He is to have 1-2 bowel movements every day now having 1 every other day or so.  Family history is negative for CRC.  Past Medical History  Diagnosis Date  . Allergic rhinitis, seasonal   . Erectile dysfunction   . Hypothyroidism   . Hyperlipidemia   . Hypertension   . BPH (benign prostatic hypertrophy)   . Hx of thyroidectomy   . Prostate cancer ([place]) 2011    treated with radiation  . Urosepsis 07/2011    ICU admission [facility]    Past Surgical History  Procedure Laterality Date  . Thyroidectomy  2003  . Transurethral resection of thr prostate  2003  . Ureteral stone extraction  08/2011  . Prostate surgery    . Colonoscopy N/A 09/03/2013    Procedure: COLONOSCOPY;  Surgeon: Rogene Houston, MD;  Location: [facility];  Service: Endoscopy;  Laterality: N/A;  1225    Family History  Problem Relation Age of Onset  . Stroke Mother   . Diabetes Father   . Hypertension Father   . Kidney disease Brother   . Stroke Brother   . Diabetes Brother    Social History:  reports that he has never smoked. He has never used smokeless tobacco. He reports that he does not drink alcohol or use illicit drugs.  Allergies:  Allergies  Allergen Reactions  . Peanut-Containing Drug Products Shortness Of Breath and Swelling  . Eggs Or Egg-Derived Products Swelling  . Iodine Swelling  . Shellfish Allergy Swelling  . Penicillins Hives and Rash    Has patient had a PCN reaction causing immediate rash,  facial/tongue/throat swelling, SOB or lightheadedness with hypotension: No Has patient had a PCN reaction causing severe rash involving mucus membranes or skin necrosis: Yes Has patient had a PCN reaction that required hospitalization No Has patient had a PCN reaction occurring within the last 10 years: No If all of the above answers are "NO", then may proceed with Cephalosporin use.     Medications Prior to Admission  Medication Sig Dispense Refill  . amLODipine (NORVASC) 10 MG tablet Take 1 tablet (10 mg total) by mouth daily. for blood pressure 90 tablet 0  . aspirin EC 81 MG tablet Take 1 tablet (81 mg total) by mouth daily. 30 tablet 5  . cetirizine (ZYRTEC ALLERGY) 10 MG tablet Take 10 mg by mouth daily.     . hydrochlorothiazide (MICROZIDE) 12.5 MG capsule Take 1 capsule (12.5 mg total) by mouth daily. 90 capsule 0  . levothyroxine (SYNTHROID, LEVOTHROID) 75 MCG tablet TAKE 1 TABLET BY MOUTH DAILY FOR THYROID 90 tablet 0  . lovastatin (MEVACOR) 40 MG tablet Take 1 tablet (40 mg total) by mouth daily. 90 tablet 0  . Multiple Vitamin (MULTIVITAMIN WITH MINERALS) TABS tablet Take 1 tablet by mouth daily.    Marland Kitchen oxybutynin (DITROPAN) 5 MG tablet TAKE 1 TABLET BY MOUTH DAILY FOR URINARY INCONTINENCE 90 tablet 0  . polyethylene glycol-electrolytes (NULYTELY/GOLYTELY) 420 G solution Take 4,000  mLs by mouth once. 4000 mL 0  . potassium chloride SA (K-DUR,KLOR-CON) 20 MEQ tablet Take 1 tablet (20 mEq total) by mouth daily. 90 tablet 0    No results found for this or any previous visit (from the past 48 hour(s)). No results found.  ROS  Blood pressure 131/98, pulse 87, temperature 98.2 F (36.8 C), temperature source Oral, resp. rate 17, height 5\' 7"  (1.702 m), weight 164 lb (74.39 kg), SpO2 100 %. Physical Exam  Constitutional: He appears well-developed and well-nourished.  HENT:  Mouth/Throat: Oropharynx is clear and moist.  Eyes: Conjunctivae are normal. No scleral icterus.  Neck: No  thyromegaly present.  Cardiovascular: Normal rate, regular rhythm and normal heart sounds.   No murmur heard. Respiratory: Effort normal and breath sounds normal.  Midsternal scar.  GI: Soft. He exhibits no distension and no mass. There is no tenderness.  Musculoskeletal: He exhibits no edema.  Lymphadenopathy:    He has no cervical adenopathy.  Neurological: He is alert.  Skin: Skin is warm and dry.     Assessment/Plan Heme positive stool and  And change in bowel habits. History of colonic adenomas.  Diagnostic colonoscopy.  PaulNAJEEB Cooper 11/08/2015, 11:37 AM

## 2015-11-08 NOTE — Op Note (Addendum)
COLONOSCOPY PROCEDURE REPORT  PATIENT:  [name]  MR#:  [id number] Birthdate:  1941/07/30, 75 y.o., male Endoscopist:  Dr. Rogene Houston, MD Referred By:  Dr. Tula Nakayama, MD Procedure Date: 11/08/2015  Procedure:   Colonoscopy with coagulation of rectal telangiectasia with APC.  Indications: patient is 75 year old American male who has history of colonic adenomas. He was recently noted to have heme-positive stool in stool frequency has decreased. He also hashas been passing small amount of blood per rectum with his bowel movements.  Informed Consent:  The procedure and risks were reviewed with the patient and informed consent was obtained.  Medications:  Demerol 50 mg IV Versed 4 mg IV  First dose administered at 1143 Last dose administered at  22  Scope out 12:21 PM   Description of procedure:  After a digital rectal exam was performed, that colonoscope was advanced from the anus through the rectum and colon to the area of the cecum, ileocecal valve and appendiceal orifice. The cecum was deeply intubated. These structures were well-seen and photographed for the record. From the level of the cecum and ileocecal valve, the scope was slowly and cautiously withdrawn. The mucosal surfaces were carefully surveyed utilizing scope tip to flexion to facilitate fold flattening as needed. The scope was pulled down into the rectum where a thorough exam including retroflexion was performed.  Findings:   Prep excellent. Normal mucosa of cecum, ascending colon, hepatic flexure, transverse colonand splenic flexure. Scattered diverticula noted at descending and sigmoid colon. Small polyp ablated via cold biopsy from proximal sigmoid colon. Fresh blood noted in the rectum and was traced to bleeding telangiectasia proximal to  dentate line. Small hemorrhoids noted below the dentate line.    Therapeutic/Diagnostic Maneuvers Performed:  Bleeding and nonbleeding telangiectasia were  ablated with argon plasma coagulator. Small lesions very close to the dentate line were not treated.  Complications: None  EBL: Minimal  Cecal Withdrawal Time:  22 minutes  Impression:  Examination performed to cecum. Left-sided diverticulosis. Small polyp ablated via cold biopsy from sigmoid colon. Distal rectal telangiectasia with active bleeding( radiation proctitis). These lesions were coagulated with argon plasma coagulator with hemostasis. Small external hemorrhoids.  Recommendations:  Standard instructions given. No aspirin for 3 days. Benefiber 4 g by mouth daily at bedtime. I will contact patient with biopsy results and further recommendations.  [name],[name]  11/08/2015 12:28 PM  CC: Dr. Tula Nakayama, MD & Dr. Rayne Du ref. provider found

## 2015-11-08 NOTE — Discharge Instructions (Signed)
Resume aspirin on 11/12/2015. Resume other medications as before. High fiber diet. Benefiber 4 g by mouth daily at bedtime. No driving for 24 hours. Physician will call with biopsy results.   Colonoscopy, Care After These instructions give you information on caring for yourself after your procedure. Your doctor may also give you more specific instructions. Call your doctor if you have any problems or questions after your procedure. HOME CARE  Do not drive for 24 hours.  Do not sign important papers or use machinery for 24 hours.  You may shower.  You may go back to your usual activities, but go slower for the first 24 hours.  Take rest breaks often during the first 24 hours.  Walk around or use warm packs on your belly (abdomen) if you have belly cramping or gas.  Drink enough fluids to keep your pee (urine) clear or pale yellow.  Resume your normal diet. Avoid heavy or fried foods.  Avoid drinking alcohol for 24 hours or as told by your doctor.  Only take medicines as told by your doctor. If a tissue sample (biopsy) was taken during the procedure:   Do not take aspirin or blood thinners for 7 days, or as told by your doctor.  Do not drink alcohol for 7 days, or as told by your doctor.  Eat soft foods for the first 24 hours. GET HELP IF: You still have a small amount of blood in your poop (stool) 2-3 days after the procedure. GET HELP RIGHT AWAY IF:  You have more than a small amount of blood in your poop.  You see clumps of tissue (blood clots) in your poop.  Your belly is puffy (swollen).  You feel sick to your stomach (nauseous) or throw up (vomit).  You have a fever.  You have belly pain that gets worse and medicine does not help. MAKE SURE YOU:  Understand these instructions.  Will watch your condition.  Will get help right away if you are not doing well or get worse.   This information is not intended to replace advice given to you by your health  care provider. Make sure you discuss any questions you have with your health care provider.   Document Released: 11/02/2010 Document Revised: 10/05/2013 Document Reviewed: 06/07/2013 Elsevier Interactive Patient Education [employer].   Diverticulosis Diverticulosis is the condition that develops when small pouches (diverticula) form in the wall of your colon. Your colon, or large intestine, is where water is absorbed and stool is formed. The pouches form when the inside layer of your colon pushes through weak spots in the outer layers of your colon. CAUSES  No one knows exactly what causes diverticulosis. RISK FACTORS  Being older than 37. Your risk for this condition increases with age. Diverticulosis is rare in people younger than 40 years. By age 66, almost everyone has it.  Eating a low-fiber diet.  Being frequently constipated.  Being overweight.  Not getting enough exercise.  Smoking.  Taking over-the-counter pain medicines, like aspirin and ibuprofen. SYMPTOMS  Most people with diverticulosis do not have symptoms. DIAGNOSIS  Because diverticulosis often has no symptoms, health care providers often discover the condition during an exam for other colon problems. In many cases, a health care provider will diagnose diverticulosis while using a flexible scope to examine the colon (colonoscopy). TREATMENT  If you have never developed an infection related to diverticulosis, you may not need treatment. If you have had an infection before, treatment may include:  Eating more fruits, vegetables, and grains.  Taking a fiber supplement.  Taking a live bacteria supplement (probiotic).  Taking medicine to relax your colon. HOME CARE INSTRUCTIONS   Drink at least 6-8 glasses of water each day to prevent constipation.  Try not to strain when you have a bowel movement.  Keep all follow-up appointments. If you have had an infection before:  Increase the fiber in your  diet as directed by your health care provider or dietitian.  Take a dietary fiber supplement if your health care provider approves.  Only take medicines as directed by your health care provider. SEEK MEDICAL CARE IF:   You have abdominal pain.  You have bloating.  You have cramps.  You have not gone to the bathroom in 3 days. SEEK IMMEDIATE MEDICAL CARE IF:   Your pain gets worse.  Yourbloating becomes very bad.  You have a fever or chills, and your symptoms suddenly get worse.  You begin vomiting.  You have bowel movements that are bloody or black. MAKE SURE YOU:  Understand these instructions.  Will watch your condition.  Will get help right away if you are not doing well or get worse.   This information is not intended to replace advice given to you by your health care provider. Make sure you discuss any questions you have with your health care provider.   Document Released: 06/27/2004 Document Revised: 10/05/2013 Document Reviewed: 08/25/2013 Elsevier Interactive Patient Education 2016 Else[employer] Inc[employer]    High-Fiber Diet Fiber, also called dietary fiber, is a type of carbohydrate found in fruits, vegetables, whole grains, and beans. A high-fiber diet can have many health benefits. Your health care provider may recommend a high-fiber diet to help:  Prevent constipation. Fiber can make your bowel movements more regular.  Lower your cholesterol.  Relieve hemorrhoids, uncomplicated diverticulosis, or irritable bowel syndrome.  Prevent overeating as part of a weight-loss plan.  Prevent heart disease, type 2 diabetes, and certain cancers. WHAT IS MY PLAN? The recommended daily intake of fiber includes:  38 grams for men under age 33.  83 grams for men over age 10.  65 grams for women under age 47.  22 grams for women over age 79. You can get the recommended daily intake of dietary fiber by eating a variety of fruits, vegetables, grains, and beans. Your  health care provider may also recommend a fiber supplement if it is not possible to get enough fiber through your diet. WHAT DO I NEED TO KNOW ABOUT A HIGH-FIBER DIET?  Fiber supplements have not been widely studied for their effectiveness, so it is better to get fiber through food sources.  Always check the fiber content on thenutrition facts label of any prepackaged food. Look for foods that contain at least 5 grams of fiber per serving.  Ask your dietitian if you have questions about specific foods that are related to your condition, especially if those foods are not listed in the following section.  Increase your daily fiber consumption gradually. Increasing your intake of dietary fiber too quickly may cause bloating, cramping, or gas.  Drink plenty of water. Water helps you to digest fiber. WHAT FOODS CAN I EAT? Grains Whole-grain breads. Multigrain cereal. Oats and oatmeal. Brown rice. Barley. Bulgur wheat. [place]. Bran muffins. Popcorn. Rye wafer crackers. Vegetables Sweet potatoes. Spinach. Kale. Artichokes. Cabbage. Broccoli. Green peas. Carrots. Squash. Fruits Berries. Pears. Apples. Oranges. Avocados. Prunes and raisins. Dried figs. Meats and Other Protein Sources [place], kidney, pinto, and soy  beans. Split peas. Lentils. Nuts and seeds. Dairy Fiber-fortified yogurt. Beverages Fiber-fortified soy milk. Fiber-fortified orange juice. Other Fiber bars. The items listed above may not be a complete list of recommended foods or beverages. Contact your dietitian for more options. WHAT FOODS ARE NOT RECOMMENDED? Grains White bread. Pasta made with refined flour. White rice. Vegetables Fried potatoes. Canned vegetables. Well-cooked vegetables.  Fruits Fruit juice. Cooked, strained fruit. Meats and Other Protein Sources Fatty cuts of meat. Fried Sales executive or fried fish. Dairy Milk. Yogurt. Cream cheese. Sour cream. Beverages Soft drinks. Other Cakes and pastries. Butter and  oils. The items listed above may not be a complete list of foods and beverages to avoid. Contact your dietitian for more information. WHAT ARE SOME TIPS FOR INCLUDING HIGH-FIBER FOODS IN MY DIET?  Eat a wide variety of high-fiber foods.  Make sure that half of all grains consumed each day are whole grains.  Replace breads and cereals made from refined flour or white flour with whole-grain breads and cereals.  Replace white rice with brown rice, bulgur wheat, or millet.  Start the day with a breakfast that is high in fiber, such as a cereal that contains at least 5 grams of fiber per serving.  Use beans in place of meat in soups, salads, or pasta.  Eat high-fiber snacks, such as berries, raw vegetables, nuts, or popcorn.   This information is not intended to replace advice given to you by your health care provider. Make sure you discuss any questions you have with your health care provider.   Document Released: 09/30/2005 Document Revised: 10/21/2014 Document Reviewed: 03/15/2014 Elsevier Interactive Patient Education [employer].

## 2015-11-10 ENCOUNTER — Encounter: Payer: Self-pay | Admitting: Family Medicine

## 2015-11-10 ENCOUNTER — Encounter (HOSPITAL_COMMUNITY): Payer: Self-pay | Admitting: Internal Medicine

## 2015-11-10 DIAGNOSIS — D126 Benign neoplasm of colon, unspecified: Secondary | ICD-10-CM | POA: Insufficient documentation

## 2015-12-11 ENCOUNTER — Other Ambulatory Visit: Payer: Self-pay

## 2015-12-11 MED ORDER — SILDENAFIL CITRATE 20 MG PO TABS: ORAL_TABLET | ORAL | Status: DC

## 2015-12-26 DIAGNOSIS — R7303 Prediabetes: Secondary | ICD-10-CM | POA: Diagnosis not present

## 2015-12-26 DIAGNOSIS — R7309 Other abnormal glucose: Secondary | ICD-10-CM | POA: Diagnosis not present

## 2015-12-26 DIAGNOSIS — E785 Hyperlipidemia, unspecified: Secondary | ICD-10-CM | POA: Diagnosis not present

## 2015-12-26 DIAGNOSIS — I10 Essential (primary) hypertension: Secondary | ICD-10-CM | POA: Diagnosis not present

## 2015-12-26 LAB — CBC
HEMATOCRIT: 49.1 % (ref 39.0–52.0)
HEMOGLOBIN: 15.7 g/dL (ref 13.0–17.0)
MCH: 27 pg (ref 26.0–34.0)
MCHC: 32 g/dL (ref 30.0–36.0)
MCV: 84.4 fL (ref 78.0–100.0)
MPV: 10.4 fL (ref 8.6–12.4)
Platelets: 213 10*3/uL (ref 150–400)
RBC: 5.82 MIL/uL — AB (ref 4.22–5.81)
RDW: 14.8 % (ref 11.5–15.5)
WBC: 5.5 10*3/uL (ref 4.0–10.5)

## 2015-12-27 LAB — COMPLETE METABOLIC PANEL WITH GFR
ALT: 16 U/L (ref 9–46)
AST: 19 U/L (ref 10–35)
Albumin: 4.2 g/dL (ref 3.6–5.1)
Alkaline Phosphatase: 56 U/L (ref 40–115)
BUN: 15 mg/dL (ref 7–25)
CALCIUM: 9.8 mg/dL (ref 8.6–10.3)
CHLORIDE: 102 mmol/L (ref 98–110)
CO2: 29 mmol/L (ref 20–31)
CREATININE: 1.2 mg/dL — AB (ref 0.70–1.18)
GFR, Est African American: 67 mL/min (ref >=60)
GFR, Est Non African American: 58 mL/min — ABNORMAL LOW (ref >=60)
Glucose, Bld: 127 mg/dL — ABNORMAL HIGH (ref 65–99)
POTASSIUM: 4.3 mmol/L (ref 3.5–5.3)
Sodium: 144 mmol/L (ref 135–146)
Total Bilirubin: 0.7 mg/dL (ref 0.2–1.2)
Total Protein: 7.5 g/dL (ref 6.1–8.1)

## 2015-12-27 LAB — LIPID PANEL
Cholesterol: 155 mg/dL (ref 125–200)
HDL: 64 mg/dL (ref >=40)
LDL CALC: 75 mg/dL (ref <130)
Total CHOL/HDL Ratio: 2.4 Ratio (ref <=5.0)
Triglycerides: 78 mg/dL (ref <150)
VLDL: 16 mg/dL (ref <30)

## 2015-12-27 LAB — TSH: TSH: 2.59 m[IU]/L (ref 0.40–4.50)

## 2015-12-27 LAB — HEMOGLOBIN A1C
HEMOGLOBIN A1C: 6.4 % — AB (ref <5.7)
MEAN PLASMA GLUCOSE: 137 mg/dL — AB (ref <117)

## 2016-01-08 ENCOUNTER — Encounter: Payer: Self-pay | Admitting: Family Medicine

## 2016-01-08 ENCOUNTER — Ambulatory Visit (INDEPENDENT_AMBULATORY_CARE_PROVIDER_SITE_OTHER): Payer: PPO | Admitting: Family Medicine

## 2016-01-08 VITALS — BP 124/80 | HR 85 | Resp 16 | Ht 68.0 in | Wt 165.0 lb

## 2016-01-08 DIAGNOSIS — I10 Essential (primary) hypertension: Secondary | ICD-10-CM

## 2016-01-08 DIAGNOSIS — E785 Hyperlipidemia, unspecified: Secondary | ICD-10-CM

## 2016-01-08 DIAGNOSIS — Z0000 Encounter for general adult medical examination without abnormal findings: Secondary | ICD-10-CM | POA: Diagnosis not present

## 2016-01-08 DIAGNOSIS — E039 Hypothyroidism, unspecified: Secondary | ICD-10-CM

## 2016-01-08 DIAGNOSIS — R7303 Prediabetes: Secondary | ICD-10-CM

## 2016-01-08 MED ORDER — AMLODIPINE BESYLATE 10 MG PO TABS: 10.0000 mg | ORAL_TABLET | Freq: Every day | ORAL | Status: DC

## 2016-01-08 MED ORDER — HYDROCHLOROTHIAZIDE 12.5 MG PO CAPS: 12.5000 mg | ORAL_CAPSULE | Freq: Every day | ORAL | Status: DC

## 2016-01-08 MED ORDER — LEVOTHYROXINE SODIUM 75 MCG PO TABS: ORAL_TABLET | ORAL | Status: DC

## 2016-01-08 MED ORDER — POTASSIUM CHLORIDE CRYS ER 20 MEQ PO TBCR: 20.0000 meq | EXTENDED_RELEASE_TABLET | Freq: Every day | ORAL | Status: DC

## 2016-01-08 MED ORDER — LOVASTATIN 40 MG PO TABS: 40.0000 mg | ORAL_TABLET | Freq: Every day | ORAL | Status: DC

## 2016-01-08 MED ORDER — OXYBUTYNIN CHLORIDE 5 MG PO TABS: ORAL_TABLET | ORAL | Status: DC

## 2016-01-08 NOTE — Assessment & Plan Note (Signed)

## 2016-01-08 NOTE — Progress Notes (Signed)
Subjective:    Patient ID: [name], male    DOB: Feb 20, 1941, 75 y.o.   MRN: [id number]  HPI Preventive Screening-Counseling & Management   Patient present here today for a Medicare annual wellness visit.   Current Problems (verified)   Medications Prior to Visit Allergies (verified)   PAST HISTORY  Family History (verified)   Social History Married for 60 years, retired from [employer], educational, never smoked    Risk Factors  Current exercise habits:  Walks on treadmill several days per week  Dietary issues discussed:   Cardiac risk factors:   Depression Screen  (Note: if answer to either of the following is "Yes", a more complete depression screening is indicated)   Over the past two weeks, have you felt down, depressed or hopeless? No  Over the past two weeks, have you felt little interest or pleasure in doing things? No  Have you lost interest or pleasure in daily life? No  Do you often feel hopeless? No  Do you cry easily over simple problems? No   Activities of Daily Living  In your present state of health, do you have any difficulty performing the following activities?  Driving?: No Managing money?: No Feeding yourself?:No Getting from bed to chair?:No Climbing a flight of stairs?:No Preparing food and eating?:No Bathing or showering?:No Getting dressed?:No Getting to the toilet?:No Using the toilet?:No Moving around from place to place?: No  Fall Risk Assessment In the past year have you fallen or had a near fall?:No Are you currently taking any medications that make you dizzy?:No   Hearing Difficulties: No Do you often ask people to speak up or repeat themselves?:No Do you experience ringing or noises in your ears?:No Do you have difficulty understanding soft or whispered voices?:No  Cognitive Testing  Alert? Yes Normal Appearance?Yes  Oriented to person? Yes Place? Yes  Time? Yes  Displays appropriate judgment?Yes  Can read the correct  time from a watch face? yes Are you having problems remembering things?No  Advanced Directives have been discussed with the patient?Yes, brochure given     List the Names of Other Physician/Practitioners you currently use:  Dr Teresa Pelton (urology) Dr Laural Golden (GI )  Dr Richarda Osmond any recent Medical Services you may have received from other than [facility] providers in the past year (date may be approximate).   Assessment:    Annual Wellness Exam   Plan:     Medicare Attestation  I have personally reviewed:  The patient's medical and social history  Their use of alcohol, tobacco or illicit drugs  Their current medications and supplements  The patient's functional ability including ADLs,fall risks, home safety risks, cognitive, and hearing and visual impairment  Diet and physical activities  Evidence for depression or mood disorders  The patient's weight, height, BMI, and visual acuity have been recorded in the chart. I have made referrals, counseling, and provided education to the patient based on review of the above and I have provided the patient with a written personalized care plan for preventive services.     Review of Systems     Objective:   Physical Exam  BP 124/80 mmHg  Pulse 85  Resp 16  Ht 5\' 8"  (1.727 m)  Wt 165 lb (74.844 kg)  BMI 25.09 kg/m2  SpO2 99%       Assessment & Plan:  Medicare annual wellness visit, subsequent Annual exam as documented. Counseling done  re healthy lifestyle involving commitment to 150 minutes exercise per  week, heart healthy diet, and attaining healthy weight.The importance of adequate sleep also discussed. Regular seat belt use and home safety, is also discussed. Changes in health habits are decided on by the patient with goals and time frames  set for achieving them. Immunization and cancer screening needs are specifically addressed at this visit.

## 2016-01-08 NOTE — Patient Instructions (Signed)
Annual physical exam in 6 months, call if you need me before  Fasting lipid cmp and EGFr, HBA1C and TSH in 5 months and 3 weeks  Please work on good  health habits so that your health will improve. 1. Commitment to daily physical activity for 30 to 60  minutes, if you are able to do this.  2. Commitment to wise food choices. Aim for half of your  food intake to be vegetable and fruit, one quarter starchy foods, and one quarter protein. Try to eat on a regular schedule  3 meals per day, snacking between meals should be limited to vegetables or fruits or small portions of nuts. 64 ounces of water per day is generally recommended, unless you have specific health conditions, like heart failure or kidney failure where you will need to limit fluid intake.  3. Commitment to sufficient and a  good quality of physical and mental rest daily, generally between 6 to 8 hours per day.  WITH PERSISTANCE AND PERSEVERANCE, THE IMPOSSIBLE , BECOMES THE NORM!     Fall Prevention in the Home  Falls can cause injuries. They can happen to people of all ages. There are many things you can do to make your home safe and to help prevent falls.  WHAT CAN I DO ON THE OUTSIDE OF MY HOME?  Regularly fix the edges of walkways and driveways and fix any cracks.  Remove anything that might make you trip as you walk through a door, such as a raised step or threshold.  Trim any bushes or trees on the path to your home.  Use bright outdoor lighting.  Clear any walking paths of anything that might make someone trip, such as rocks or tools.  Regularly check to see if handrails are loose or broken. Make sure that both sides of any steps have handrails.  Any raised decks and porches should have guardrails on the edges.  Have any leaves, snow, or ice cleared regularly.  Use sand or salt on walking paths during winter.  Clean up any spills in your garage right away. This includes oil or grease spills. WHAT CAN I DO IN  THE BATHROOM?   Use night lights.  Install grab bars by the toilet and in the tub and shower. Do not use towel bars as grab bars.  Use non-skid mats or decals in the tub or shower.  If you need to sit down in the shower, use a plastic, non-slip stool.  Keep the floor dry. Clean up any water that spills on the floor as soon as it happens.  Remove soap buildup in the tub or shower regularly.  Attach bath mats securely with double-sided non-slip rug tape.  Do not have throw rugs and other things on the floor that can make you trip. WHAT CAN I DO IN THE BEDROOM?  Use night lights.  Make sure that you have a light by your bed that is easy to reach.  Do not use any sheets or blankets that are too big for your bed. They should not hang down onto the floor.  Have a firm chair that has side arms. You can use this for support while you get dressed.  Do not have throw rugs and other things on the floor that can make you trip. WHAT CAN I DO IN THE KITCHEN?  Clean up any spills right away.  Avoid walking on wet floors.  Keep items that you use a lot in easy-to-reach places.  If  you need to reach something above you, use a strong step stool that has a grab bar.  Keep electrical cords out of the way.  Do not use floor polish or wax that makes floors slippery. If you must use wax, use non-skid floor wax.  Do not have throw rugs and other things on the floor that can make you trip. WHAT CAN I DO WITH MY STAIRS?  Do not leave any items on the stairs.  Make sure that there are handrails on both sides of the stairs and use them. Fix handrails that are broken or loose. Make sure that handrails are as long as the stairways.  Check any carpeting to make sure that it is firmly attached to the stairs. Fix any carpet that is loose or worn.  Avoid having throw rugs at the top or bottom of the stairs. If you do have throw rugs, attach them to the floor with carpet tape.  Make sure that you  have a light switch at the top of the stairs and the bottom of the stairs. If you do not have them, ask someone to add them for you. WHAT ELSE CAN I DO TO HELP PREVENT FALLS?  Wear shoes that:  Do not have high heels.  Have rubber bottoms.  Are comfortable and fit you well.  Are closed at the toe. Do not wear sandals.  If you use a stepladder:  Make sure that it is fully opened. Do not climb a closed stepladder.  Make sure that both sides of the stepladder are locked into place.  Ask someone to hold it for you, if possible.  Clearly mark and make sure that you can see:  Any grab bars or handrails.  First and last steps.  Where the edge of each step is.  Use tools that help you move around (mobility aids) if they are needed. These include:  Canes.  Walkers.  Scooters.  Crutches.  Turn on the lights when you go into a dark area. Replace any light bulbs as soon as they burn out.  Set up your furniture so you have a clear path. Avoid moving your furniture around.  If any of your floors are uneven, fix them.  If there are any pets around you, be aware of where they are.  Review your medicines with your doctor. Some medicines can make you feel dizzy. This can increase your chance of falling. Ask your doctor what other things that you can do to help prevent falls.   This information is not intended to replace advice given to you by your health care provider. Make sure you discuss any questions you have with your health care provider.   Document Released: 07/27/2009 Document Revised: 02/14/2015 Document Reviewed: 11/04/2014 Elsevier Interactive Patient Education [employer].

## 2016-04-25 ENCOUNTER — Other Ambulatory Visit: Payer: Self-pay | Admitting: Family Medicine

## 2016-04-30 ENCOUNTER — Other Ambulatory Visit: Payer: Self-pay | Admitting: Family Medicine

## 2016-06-25 ENCOUNTER — Other Ambulatory Visit: Payer: Self-pay | Admitting: Family Medicine

## 2016-07-03 ENCOUNTER — Telehealth: Payer: Self-pay | Admitting: Family Medicine

## 2016-07-03 DIAGNOSIS — E785 Hyperlipidemia, unspecified: Secondary | ICD-10-CM | POA: Diagnosis not present

## 2016-07-03 DIAGNOSIS — R7309 Other abnormal glucose: Secondary | ICD-10-CM | POA: Diagnosis not present

## 2016-07-03 DIAGNOSIS — R7303 Prediabetes: Secondary | ICD-10-CM | POA: Diagnosis not present

## 2016-07-03 DIAGNOSIS — I10 Essential (primary) hypertension: Secondary | ICD-10-CM | POA: Diagnosis not present

## 2016-07-03 NOTE — Telephone Encounter (Signed)
LM to reschedule - Dr. Simpson PAL °

## 2016-07-04 ENCOUNTER — Ambulatory Visit (INDEPENDENT_AMBULATORY_CARE_PROVIDER_SITE_OTHER): Payer: PPO | Admitting: Family Medicine

## 2016-07-04 ENCOUNTER — Encounter: Payer: Self-pay | Admitting: Family Medicine

## 2016-07-04 VITALS — BP 118/80 | HR 82 | Resp 16 | Ht 67.0 in | Wt 162.0 lb

## 2016-07-04 DIAGNOSIS — E559 Vitamin D deficiency, unspecified: Secondary | ICD-10-CM

## 2016-07-04 DIAGNOSIS — R7303 Prediabetes: Secondary | ICD-10-CM

## 2016-07-04 DIAGNOSIS — I10 Essential (primary) hypertension: Secondary | ICD-10-CM

## 2016-07-04 DIAGNOSIS — E785 Hyperlipidemia, unspecified: Secondary | ICD-10-CM

## 2016-07-04 DIAGNOSIS — E039 Hypothyroidism, unspecified: Secondary | ICD-10-CM

## 2016-07-04 DIAGNOSIS — Z0000 Encounter for general adult medical examination without abnormal findings: Secondary | ICD-10-CM

## 2016-07-04 LAB — TSH: TSH: 3.37 m[IU]/L (ref 0.40–4.50)

## 2016-07-04 LAB — LIPID PANEL
CHOL/HDL RATIO: 2.2 ratio (ref <=5.0)
Cholesterol: 146 mg/dL (ref 125–200)
HDL: 65 mg/dL (ref >=40)
LDL CALC: 67 mg/dL (ref <130)
Triglycerides: 70 mg/dL (ref <150)
VLDL: 14 mg/dL (ref <30)

## 2016-07-04 LAB — COMPLETE METABOLIC PANEL WITH GFR
ALBUMIN: 4.1 g/dL (ref 3.6–5.1)
ALK PHOS: 49 U/L (ref 40–115)
ALT: 12 U/L (ref 9–46)
AST: 16 U/L (ref 10–35)
BILIRUBIN TOTAL: 0.9 mg/dL (ref 0.2–1.2)
BUN: 18 mg/dL (ref 7–25)
CALCIUM: 9.2 mg/dL (ref 8.6–10.3)
CHLORIDE: 105 mmol/L (ref 98–110)
CO2: 27 mmol/L (ref 20–31)
CREATININE: 1.36 mg/dL — AB (ref 0.70–1.18)
GFR, EST AFRICAN AMERICAN: 58 mL/min — AB (ref >=60)
GFR, Est Non African American: 50 mL/min — ABNORMAL LOW (ref >=60)
Glucose, Bld: 106 mg/dL — ABNORMAL HIGH (ref 65–99)
Potassium: 4.2 mmol/L (ref 3.5–5.3)
SODIUM: 142 mmol/L (ref 135–146)
Total Protein: 6.9 g/dL (ref 6.1–8.1)

## 2016-07-04 LAB — HEMOGLOBIN A1C
HEMOGLOBIN A1C: 5.9 % — AB (ref <5.7)
Mean Plasma Glucose: 123 mg/dL

## 2016-07-04 NOTE — Progress Notes (Signed)
   [name]     MRN: [id number]      DOB: 01-28-1941   HPI: Patient is in for annual physical exam. No other health concerns are expressed or addressed at the visit. Recent labs, if available are reviewed. Immunization is reviewed , and  updated if needed.    PE; Pleasant male, alert and oriented x 3, in no cardio-pulmonary distress. Afebrile. HEENT No facial trauma or asymetry. Sinuses non tender. EOMI, pupils equally reactive to light. External ears normal, tympanic membranes clear. Oropharynx moist, no exudate. Neck: supple, no adenopathy,JVD or thyromegaly.No bruits.  Chest: Clear to ascultation bilaterally.No crackles or wheezes. Non tender to palpation  Breast: No asymetry,no masses. No nipple discharge or inversion. No axillary or supraclavicular adenopathy  Cardiovascular system; Heart sounds normal,  S1 and  S2 ,no S3.  No murmur, or thrill. Apical beat not displaced Peripheral pulses normal.  Abdomen: Soft, non tender, no organomegaly or masses. No bruits. Bowel sounds normal. No guarding, tenderness or rebound.  Rectal:  Deferred, not due  Musculoskeletal exam: Full ROM of spine, hips , shoulders and knees. No deformity ,swelling or crepitus noted. No muscle wasting or atrophy.   Neurologic: Cranial nerves 2 to 12 intact. Power, tone ,sensation and reflexes normal throughout. No disturbance in gait. No tremor.  Skin: Intact, no ulceration, erythema , scaling or rash noted. Pigmentation normal throughout  Psych; Normal mood and affect. Judgement and concentration normal   Assessment & Plan:  Annual physical exam Annual exam as documented. Counseling done  re healthy lifestyle involving commitment to 150 minutes exercise per week, heart healthy diet, and attaining healthy weight.The importance of adequate sleep also discussed. Regular seat belt use and home safety, is also discussed. Changes in health habits are decided on by the  patient with goals and time frames  set for achieving them. Immunization and cancer screening needs are specifically addressed at this visit.

## 2016-07-04 NOTE — Patient Instructions (Addendum)
Annual wellness March 28 or after, call if you need me sooner  Excellent labs and exam, no changes in medication  Fasting lipid, cmp and eGFr, hBa1C, TSH, cBC and Vit D  2nd week in March  Please work on good  health habits so that your health will improve. 1. Commitment to daily physical activity for 30 to 60  minutes, if you are able to do this.  2. Commitment to wise food choices. Aim for half of your  food intake to be vegetable and fruit, one quarter starchy foods, and one quarter protein. Try to eat on a regular schedule  3 meals per day, snacking between meals should be limited to vegetables or fruits or small portions of nuts. 64 ounces of water per day is generally recommended, unless you have specific health conditions, like heart failure or kidney failure where you will need to limit fluid intake.  3. Commitment to sufficient and a  good quality of physical and mental rest daily, generally between 6 to 8 hours per day.  WITH PERSISTANCE AND PERSEVERANCE, THE IMPOSSIBLE , BECOMES THE NORM! Thank you  for choosing [facility]. We consider it a privelige to serve you.  Delivering excellent health care in a caring and  compassionate way is our goal.  Partnering with you,  so that together we can achieve this goal is our strategy.

## 2016-07-04 NOTE — Assessment & Plan Note (Signed)

## 2016-07-11 ENCOUNTER — Encounter: Payer: PPO | Admitting: Family Medicine

## 2016-08-05 ENCOUNTER — Other Ambulatory Visit: Payer: Self-pay | Admitting: Family Medicine

## 2016-08-12 ENCOUNTER — Other Ambulatory Visit: Payer: Self-pay | Admitting: Family Medicine

## 2016-09-19 ENCOUNTER — Other Ambulatory Visit: Payer: Self-pay | Admitting: Family Medicine

## 2016-10-21 ENCOUNTER — Other Ambulatory Visit: Payer: Self-pay | Admitting: Family Medicine

## 2016-10-21 ENCOUNTER — Other Ambulatory Visit: Payer: Self-pay

## 2016-10-21 MED ORDER — AMLODIPINE BESYLATE 10 MG PO TABS: 10.0000 mg | ORAL_TABLET | Freq: Every day | ORAL | 1 refills | Status: DC

## 2016-10-25 NOTE — Telephone Encounter (Signed)
Is this ok to refill?  

## 2016-10-25 NOTE — Telephone Encounter (Signed)
pls re direct to urology, please have urology prescribe

## 2016-11-08 ENCOUNTER — Other Ambulatory Visit: Payer: Self-pay | Admitting: Family Medicine

## 2017-01-06 ENCOUNTER — Telehealth: Payer: Self-pay

## 2017-01-06 ENCOUNTER — Other Ambulatory Visit: Payer: Self-pay | Admitting: Family Medicine

## 2017-01-06 DIAGNOSIS — R7303 Prediabetes: Secondary | ICD-10-CM | POA: Diagnosis not present

## 2017-01-06 DIAGNOSIS — I10 Essential (primary) hypertension: Secondary | ICD-10-CM

## 2017-01-06 DIAGNOSIS — E039 Hypothyroidism, unspecified: Secondary | ICD-10-CM

## 2017-01-06 DIAGNOSIS — E559 Vitamin D deficiency, unspecified: Secondary | ICD-10-CM | POA: Diagnosis not present

## 2017-01-06 LAB — COMPLETE METABOLIC PANEL WITH GFR
ALBUMIN: 4 g/dL (ref 3.6–5.1)
ALK PHOS: 51 U/L (ref 40–115)
ALT: 11 U/L (ref 9–46)
AST: 18 U/L (ref 10–35)
BILIRUBIN TOTAL: 1 mg/dL (ref 0.2–1.2)
BUN: 16 mg/dL (ref 7–25)
CALCIUM: 9.4 mg/dL (ref 8.6–10.3)
CO2: 31 mmol/L (ref 20–31)
CREATININE: 1.31 mg/dL — AB (ref 0.70–1.18)
Chloride: 102 mmol/L (ref 98–110)
GFR, EST AFRICAN AMERICAN: 60 mL/min (ref >=60)
GFR, Est Non African American: 52 mL/min — ABNORMAL LOW (ref >=60)
Glucose, Bld: 107 mg/dL — ABNORMAL HIGH (ref 65–99)
Potassium: 4.4 mmol/L (ref 3.5–5.3)
Sodium: 141 mmol/L (ref 135–146)
Total Protein: 7.1 g/dL (ref 6.1–8.1)

## 2017-01-06 LAB — CBC
HEMATOCRIT: 46.4 % (ref 38.5–50.0)
HEMOGLOBIN: 15.3 g/dL (ref 13.2–17.1)
MCH: 27.7 pg (ref 27.0–33.0)
MCHC: 33 g/dL (ref 32.0–36.0)
MCV: 84.1 fL (ref 80.0–100.0)
MPV: 9.6 fL (ref 7.5–12.5)
Platelets: 232 10*3/uL (ref 140–400)
RBC: 5.52 MIL/uL (ref 4.20–5.80)
RDW: 14.5 % (ref 11.0–15.0)
WBC: 5.1 10*3/uL (ref 3.8–10.8)

## 2017-01-06 LAB — LIPID PANEL
Cholesterol: 149 mg/dL (ref <200)
HDL: 59 mg/dL (ref >40)
LDL Cholesterol: 75 mg/dL (ref <100)
Total CHOL/HDL Ratio: 2.5 Ratio (ref <5.0)
Triglycerides: 74 mg/dL (ref <150)
VLDL: 15 mg/dL (ref <30)

## 2017-01-06 LAB — TSH: TSH: 2.74 m[IU]/L (ref 0.40–4.50)

## 2017-01-07 LAB — HEMOGLOBIN A1C
HEMOGLOBIN A1C: 6 % — AB (ref <5.7)
MEAN PLASMA GLUCOSE: 126 mg/dL

## 2017-01-07 LAB — VITAMIN D 25 HYDROXY (VIT D DEFICIENCY, FRACTURES): VIT D 25 HYDROXY: 30 ng/mL (ref 30–100)

## 2017-01-09 ENCOUNTER — Ambulatory Visit (INDEPENDENT_AMBULATORY_CARE_PROVIDER_SITE_OTHER): Payer: PPO

## 2017-01-09 VITALS — BP 110/68 | HR 84 | Temp 98.9°F | Ht 67.0 in | Wt 163.1 lb

## 2017-01-09 DIAGNOSIS — Z0000 Encounter for general adult medical examination without abnormal findings: Secondary | ICD-10-CM

## 2017-01-09 NOTE — Patient Instructions (Addendum)
[name] , Thank you for taking time to come for your Medicare Wellness Visit. I appreciate your ongoing commitment to your health goals. Please review the following plan we discussed and let me know if I can assist you in the future.   Screening recommendations/referrals: Colonoscopy: Up to date Recommended yearly ophthalmology/optometry visit for glaucoma screening and checkup Recommended yearly dental visit for hygiene and checkup  Vaccinations: Influenza vaccine: Overdue, declined Pneumococcal vaccine: Up to date Tdap vaccine: Overdue, declined Shingles vaccine: Overdue, declined. Check with your insurance company to see if this is covered.     Advanced directives: Advance directive discussed with you today. I have provided a copy for you to complete at home and have notarized. Once this is complete please bring a copy in to our office so we can scan it into your chart.  Conditions/risks identified: Pre-obese, recommend starting a routine exercise program at least 3 days a week for 30-45 minutes at a time as tolerated.   Next appointment: Follow up with Dr. Moshe Cipro toward the end of September or beginning of October for your complete physical.  Preventive Care 78 Years and Older, Male Preventive care refers to lifestyle choices and visits with your health care provider that can promote health and wellness. What does preventive care include?  A yearly physical exam. This is also called an annual well check.  Dental exams once or twice a year.  Routine eye exams. Ask your health care provider how often you should have your eyes checked.  Personal lifestyle choices, including:  Daily care of your teeth and gums.  Regular physical activity.  Eating a healthy diet.  Avoiding tobacco and drug use.  Limiting alcohol use.  Practicing safe sex.  Taking low doses of aspirin every day.  Taking vitamin and mineral supplements as recommended by your health care  provider. What happens during an annual well check? The services and screenings done by your health care provider during your annual well check will depend on your age, overall health, lifestyle risk factors, and family history of disease. Counseling  Your health care provider may ask you questions about your:  Alcohol use.  Tobacco use.  Drug use.  Emotional well-being.  Home and relationship well-being.  Sexual activity.  Eating habits.  History of falls.  Memory and ability to understand (cognition).  Work and work Statistician. Screening  You may have the following tests or measurements:  Height, weight, and BMI.  Blood pressure.  Lipid and cholesterol levels. These may be checked every 5 years, or more frequently if you are over 55 years old.  Skin check.  Lung cancer screening. You may have this screening every year starting at age 78 if you have a 30-pack-year history of smoking and currently smoke or have quit within the past 15 years.  Fecal occult blood test (FOBT) of the stool. You may have this test every year starting at age 78.  Flexible sigmoidoscopy or colonoscopy. You may have a sigmoidoscopy every 5 years or a colonoscopy every 10 years starting at age 78.  Prostate cancer screening. Recommendations will vary depending on your family history and other risks.  Hepatitis C blood test.  Hepatitis B blood test.  Sexually transmitted disease (STD) testing.  Diabetes screening. This is done by checking your blood sugar (glucose) after you have not eaten for a while (fasting). You may have this done every 1-3 years.  Abdominal aortic aneurysm (AAA) screening. You may need this if you are a  current or former smoker.  Osteoporosis. You Alberico be screened starting at age 78 if you are at high risk. Talk with your health care provider about your test results, treatment options, and if necessary, the need for more tests. Vaccines  Your health care provider  Sobczyk recommend certain vaccines, such as:  Influenza vaccine. This is recommended every year.  Tetanus, diphtheria, and acellular pertussis (Tdap, Td) vaccine. You Bosques need a Td booster every 10 years.  Zoster vaccine. You Cogliano need this after age 78.  Pneumococcal 13-valent conjugate (PCV13) vaccine. One dose is recommended after age 78.  Pneumococcal polysaccharide (PPSV23) vaccine. One dose is recommended after age 78. Talk to your health care provider about which screenings and vaccines you need and how often you need them. This information is not intended to replace advice given to you by your health care provider. Make sure you discuss any questions you have with your health care provider. Document Released: 10/27/2015 Document Revised: 06/19/2016 Document Reviewed: 08/01/2015 Elsevier Interactive Patient Education  2017 [place] Prevention in the Home Falls can cause injuries. They can happen to people of all ages. There are many things you can do to make your home safe and to help prevent falls. What can I do on the outside of my home?  Regularly fix the edges of walkways and driveways and fix any cracks.  Remove anything that might make you trip as you walk through a door, such as a raised step or threshold.  Trim any bushes or trees on the path to your home.  Use bright outdoor lighting.  Clear any walking paths of anything that might make someone trip, such as rocks or tools.  Regularly check to see if handrails are loose or broken. Make sure that both sides of any steps have handrails.  Any raised decks and porches should have guardrails on the edges.  Have any leaves, snow, or ice cleared regularly.  Use sand or salt on walking paths during winter.  Clean up any spills in your garage right away. This includes oil or grease spills. What can I do in the bathroom?  Use night lights.  Install grab bars by the toilet and in the tub and shower. Do not use  towel bars as grab bars.  Use non-skid mats or decals in the tub or shower.  If you need to sit down in the shower, use a plastic, non-slip stool.  Keep the floor dry. Clean up any water that spills on the floor as soon as it happens.  Remove soap buildup in the tub or shower regularly.  Attach bath mats securely with double-sided non-slip rug tape.  Do not have throw rugs and other things on the floor that can make you trip. What can I do in the bedroom?  Use night lights.  Make sure that you have a light by your bed that is easy to reach.  Do not use any sheets or blankets that are too big for your bed. They should not hang down onto the floor.  Have a firm chair that has side arms. You can use this for support while you get dressed.  Do not have throw rugs and other things on the floor that can make you trip. What can I do in the kitchen?  Clean up any spills right away.  Avoid walking on wet floors.  Keep items that you use a lot in easy-to-reach places.  If you need to reach something above  you, use a strong step stool that has a grab bar.  Keep electrical cords out of the way.  Do not use floor polish or wax that makes floors slippery. If you must use wax, use non-skid floor wax.  Do not have throw rugs and other things on the floor that can make you trip. What can I do with my stairs?  Do not leave any items on the stairs.  Make sure that there are handrails on both sides of the stairs and use them. Fix handrails that are broken or loose. Make sure that handrails are as long as the stairways.  Check any carpeting to make sure that it is firmly attached to the stairs. Fix any carpet that is loose or worn.  Avoid having throw rugs at the top or bottom of the stairs. If you do have throw rugs, attach them to the floor with carpet tape.  Make sure that you have a light switch at the top of the stairs and the bottom of the stairs. If you do not have them, ask  someone to add them for you. What else can I do to help prevent falls?  Wear shoes that:  Do not have high heels.  Have rubber bottoms.  Are comfortable and fit you well.  Are closed at the toe. Do not wear sandals.  If you use a stepladder:  Make sure that it is fully opened. Do not climb a closed stepladder.  Make sure that both sides of the stepladder are locked into place.  Ask someone to hold it for you, if possible.  Clearly mark and make sure that you can see:  Any grab bars or handrails.  First and last steps.  Where the edge of each step is.  Use tools that help you move around (mobility aids) if they are needed. These include:  Canes.  Walkers.  Scooters.  Crutches.  Turn on the lights when you go into a dark area. Replace any light bulbs as soon as they burn out.  Set up your furniture so you have a clear path. Avoid moving your furniture around.  If any of your floors are uneven, fix them.  If there are any pets around you, be aware of where they are.  Review your medicines with your doctor. Some medicines can make you feel dizzy. This can increase your chance of falling. Ask your doctor what other things that you can do to help prevent falls. This information is not intended to replace advice given to you by your health care provider. Make sure you discuss any questions you have with your health care provider. Document Released: 07/27/2009 Document Revised: 03/07/2016 Document Reviewed: 11/04/2014 Elsevier Interactive Patient Education  2017 [employer].

## 2017-01-09 NOTE — Progress Notes (Signed)
Subjective:   [name] is a 76 y.o. male who presents for Medicare Annual/Subsequent preventive examination.  Review of Systems:  Cardiac Risk Factors include: advanced age (>52men, >57 women);dyslipidemia;hypertension;male gender;sedentary lifestyle     Objective:    Vitals: BP 110/68   Pulse 84   Temp 98.9 F (37.2 C) (Oral)   Ht 5\' 7"  (1.702 m)   Wt 163 lb 1.9 oz (74 kg)   SpO2 98%   BMI 25.55 kg/m   Body mass index is 25.55 kg/m.  Tobacco History  Smoking Status  . Never Smoker  Smokeless Tobacco  . Never Used     Counseling given: Not Answered   Past Medical History:  Diagnosis Date  . Allergic rhinitis, seasonal   . BPH (benign prostatic hypertrophy)   . Erectile dysfunction   . Hx of thyroidectomy   . Hyperlipidemia   . Hypertension   . Hypothyroidism   . Prostate cancer ([place]) 2011   treated with radiation  . Urosepsis 07/2011   ICU admission [facility]   Past Surgical History:  Procedure Laterality Date  . COLONOSCOPY N/A 09/03/2013   Procedure: COLONOSCOPY;  Surgeon: Rogene Houston, MD;  Location: [facility];  Service: Endoscopy;  Laterality: N/A;  1225  . COLONOSCOPY N/A 11/08/2015   Procedure: COLONOSCOPY;  Surgeon: Rogene Houston, MD;  Location: [facility];  Service: Endoscopy;  Laterality: N/A;  1200  . PROSTATE SURGERY    . THYROIDECTOMY  2003  . transurethral resection of thr prostate  2003  . ureteral stone extraction  08/2011   Family History  Problem Relation Age of Onset  . Stroke Mother   . Diabetes Father   . Hypertension Father   . Kidney disease Brother   . Stroke Brother   . Diabetes Brother   . Gout Brother   . Diabetes Brother    History  Sexual Activity  . Sexual activity: Yes    Outpatient Encounter Prescriptions as of 01/09/2017  Medication Sig  . amLODipine (NORVASC) 10 MG tablet Take 1 tablet (10 mg total) by mouth daily. for blood pressure  . aspirin EC 81 MG tablet Take 1 tablet (81 mg total) by  mouth daily.  . cetirizine (ZYRTEC ALLERGY) 10 MG tablet Take 10 mg by mouth daily.   . hydrochlorothiazide (MICROZIDE) 12.5 MG capsule TAKE ONE CAPSULE BY MOUTH ONCE DAILY.  Marland Kitchen levothyroxine (SYNTHROID, LEVOTHROID) 75 MCG tablet TAKE 1 TABLET BY MOUTH ONCE A DAY.  Marland Kitchen lovastatin (MEVACOR) 40 MG tablet TAKE 1 TABLET BY MOUTH ONCE A DAY.  . Multiple Vitamin (MULTIVITAMIN WITH MINERALS) TABS tablet Take 1 tablet by mouth daily.  Marland Kitchen oxybutynin (DITROPAN) 5 MG tablet TAKE 1 TABLET BY MOUTH ONCE A DAY.  Marland Kitchen potassium chloride SA (K-DUR,KLOR-CON) 20 MEQ tablet TAKE 1 TABLET BY MOUTH ONCE A DAY.  . sildenafil (REVATIO) 20 MG tablet TAKE 3 TABLETS BY MOUTH DAILY.  Marland Kitchen Wheat Dextrin (BENEFIBER DRINK MIX) PACK Take 4 g by mouth at bedtime. (Patient not taking: Reported on 01/09/2017)   No facility-administered encounter medications on file as of 01/09/2017.     Activities of Daily Living In your present state of health, do you have any difficulty performing the following activities: 01/09/2017  Hearing? N  Vision? N  Difficulty concentrating or making decisions? N  Walking or climbing stairs? N  Dressing or bathing? N  Doing errands, shopping? N  Preparing Food and eating ? N  Using the Toilet? N  In the past six months, have you accidently leaked urine? N  Do you have problems with loss of bowel control? N  Managing your Medications? N  Managing your Finances? N  Housekeeping or managing your Housekeeping? N  Some recent data might be hidden    Patient Care Team: Fayrene Helper, MD as PCP - General Enzo Montgomery, MD as Attending Physician (Urology)   Assessment:    Exercise Activities and Dietary recommendations Current Exercise Habits: The patient does not participate in regular exercise at present, Exercise limited by: None identified  Goals    . Exercise 3x per week (30 min per time)          Recommend starting a routine exercise program at least 3 days a week for 30-45 minutes at a  time as tolerated.        Fall Risk Fall Risk  01/09/2017 01/08/2016 09/20/2015 06/07/2015 01/05/2015  Falls in the past year? No No No No No   Depression Screen PHQ 2/9 Scores 01/09/2017 01/05/2015 07/12/2013  PHQ - 2 Score 0 0 0  PHQ- 9 Score - 0 -    Cognitive Function: Normal   6CIT Screen 01/09/2017  What Year? 0 points  What month? 0 points  What time? 0 points  Count back from 20 0 points  Months in reverse 0 points  Repeat phrase 0 points  Total Score 0    Immunization History  Administered Date(s) Administered  . Influenza Whole 09/28/2004  . Pneumococcal Conjugate-13 09/05/2014  . Pneumococcal Polysaccharide-23 03/19/2004, 07/11/2009  . Td 03/19/2004   Screening Tests Health Maintenance  Topic Date Due  . INFLUENZA VACCINE  01/11/2017 (Originally 05/14/2016)  . TETANUS/TDAP  10/01/2017 (Originally 03/19/2014)  . HEMOGLOBIN A1C  07/09/2017  . COLONOSCOPY  11/07/2018  . PNA vac Low Risk Adult  Completed      Plan:  I have personally reviewed and addressed the Medicare Annual Wellness questionnaire and have noted the following in the patient's chart:  A. Medical and social history B. Use of alcohol, tobacco or illicit drugs  C. Current medications and supplements D. Functional ability and status E.  Nutritional status F.  Physical activity G. Advance directives H. List of other physicians I.  Hospitalizations, surgeries, and ER visits in previous 12 months J.  [place] to include cognitive, depression, and falls L. Referrals and appointments - none  In addition, I have reviewed and discussed with patient certain preventive protocols, quality metrics, and best practice recommendations. A written personalized care plan for preventive services as well as general preventive health recommendations were provided to patient.  Signed,   Stormy Fabian, LPN Lead Nurse Health Advisor

## 2017-01-13 NOTE — Telephone Encounter (Signed)
error 

## 2017-02-04 ENCOUNTER — Other Ambulatory Visit: Payer: Self-pay | Admitting: Family Medicine

## 2017-02-24 ENCOUNTER — Encounter: Payer: Self-pay | Admitting: Family Medicine

## 2017-02-24 ENCOUNTER — Ambulatory Visit (INDEPENDENT_AMBULATORY_CARE_PROVIDER_SITE_OTHER): Payer: PPO | Admitting: Family Medicine

## 2017-02-24 VITALS — BP 130/82 | HR 83 | Temp 98.6°F | Resp 16 | Ht 67.0 in | Wt 162.0 lb

## 2017-02-24 DIAGNOSIS — J301 Allergic rhinitis due to pollen: Secondary | ICD-10-CM

## 2017-02-24 DIAGNOSIS — E039 Hypothyroidism, unspecified: Secondary | ICD-10-CM | POA: Diagnosis not present

## 2017-02-24 DIAGNOSIS — R351 Nocturia: Secondary | ICD-10-CM

## 2017-02-24 DIAGNOSIS — E785 Hyperlipidemia, unspecified: Secondary | ICD-10-CM

## 2017-02-24 DIAGNOSIS — Z8546 Personal history of malignant neoplasm of prostate: Secondary | ICD-10-CM

## 2017-02-24 DIAGNOSIS — I10 Essential (primary) hypertension: Secondary | ICD-10-CM

## 2017-02-24 MED ORDER — AZELASTINE HCL 0.1 % NA SOLN: 2.0000 | Freq: Two times a day (BID) | NASAL | 12 refills | Status: DC

## 2017-02-24 NOTE — Patient Instructions (Signed)
Keep appointment for physical as before  I am referring you you to your urologist  Please change the way you drink water to finish by 7:30 at night this will help a lot.  Recent blood work is excellent  I have sent an nose spray to [place] to help with your allergies  Thank you  for choosing [facility]. We consider it a privelige to serve you.  Delivering excellent health care in a caring and  compassionate way is our goal.  Partnering with you,  so that together we can achieve this goal is our strategy.      TSH, hBa1C and chem 7 1 week before next visitnon fasting

## 2017-02-24 NOTE — Assessment & Plan Note (Signed)
Controlled, no change in medication  

## 2017-02-24 NOTE — Assessment & Plan Note (Signed)
Controlled, no change in medication DASH diet and commitment to daily physical activity for a minimum of 30 minutes discussed and encouraged, as a part of hypertension management. The importance of attaining a healthy weight is also discussed.  BP/Weight 02/24/2017 01/09/2017 07/04/2016 01/08/2016 11/08/2015 10/03/2015 06/21/1190  Systolic BP 478 [phone]  Diastolic BP 82 68 80 80 72 70 82  Wt. (Lbs) 162 163.12 162 165 164 162.6 163  BMI 25.37 25.55 25.37 25.09 25.68 25.08 25.52

## 2017-02-24 NOTE — Assessment & Plan Note (Signed)
Hyperlipidemia:Low fat diet discussed and encouraged.   Lipid Panel  Lab Results  Component Value Date   CHOL 149 01/06/2017   HDL 59 01/06/2017   LDLCALC 75 01/06/2017   TRIG 74 01/06/2017   CHOLHDL 2.5 01/06/2017   Controlled, no change in medication

## 2017-02-24 NOTE — Progress Notes (Signed)
[name]     MRN: [id number]      DOB: 27-Apr-1941   HPI [name] is here for follow up and re-evaluation of chronic medical conditions, medication management and review of any available recent lab and radiology data.  Preventive health is updated, specifically  Cancer screening and Immunization.    The PT denies any adverse reactions to current medications since the last visit.  c/o fatigue x 3 month, but states once he does get going he keeps going Notes that he has to void at least 4 times per night, flow is good and he voids a lot but incomplete since December, has not seen urologist for over 1 year, this concerns him, he has been dx with prostate cancer and had radiation treatment. Spouse c/o him lying in bed later in the morning, not eating breakfast, but on review his weight is essentially the same in the past 3 years Labs done 6 weeks ago are excellent, and this is reassuring to both himself and his spouse ROS Denies recent fever or chills. Denies sinus pressure, c/o increased  nasal congestion,  Denies ear pain or sore throat. Denies chest congestion, productive cough or wheezing. Denies chest pains, palpitations and leg swelling Denies abdominal pain, nausea, vomiting,diarrhea or constipation.   Denies disabling joint pain, swelling and limitation in mobility. Denies headaches, seizures, numbness, or tingling. Denies depression, anxiety or insomnia. Denies skin break down or rash.   PE  BP 118/74   Pulse 83   Temp 98.6 F (37 C) (Oral)   Resp 16   Ht 5\' 7"  (1.702 m)   Wt 162 lb (73.5 kg)   SpO2 96%   BMI 25.37 kg/m   Patient alert and oriented and in no cardiopulmonary distress.  HEENT: No facial asymmetry, EOMI,   oropharynx pink and moist.  Neck supple no JVD, no mass.No sinus tenderness, nasal mucosa erythematous and edematous  Chest: Clear to auscultation bilaterally.  CVS: S1, S2 no murmurs, no S3.Regular rate.  ABD: Soft non tender.    Ext: No edema  MS: Adequate though slightly reduced ROM spine, normal in  shoulders, hips and knees.  Skin: Intact, no ulcerations or rash noted.  Psych: Good eye contact, normal affect. Memory intact not anxious or depressed appearing.  CNS: CN 2-12 intact, power,  normal throughout.no focal deficits noted.   Assessment & Plan  Essential hypertension Controlled, no change in medication DASH diet and commitment to daily physical activity for a minimum of 30 minutes discussed and encouraged, as a part of hypertension management. The importance of attaining a healthy weight is also discussed.  BP/Weight 02/24/2017 01/09/2017 07/04/2016 01/08/2016 11/08/2015 10/03/2015 51/0/2585  Systolic BP 277 824 235 361 443 154 008  Diastolic BP 82 68 80 80 72 70 82  Wt. (Lbs) 162 163.12 162 165 164 162.6 163  BMI 25.37 25.55 25.37 25.09 25.68 25.08 25.52       Hyperlipidemia LDL goal <100 Hyperlipidemia:Low fat diet discussed and encouraged.   Lipid Panel  Lab Results  Component Value Date   CHOL 149 01/06/2017   HDL 59 01/06/2017   LDLCALC 75 01/06/2017   TRIG 74 01/06/2017   CHOLHDL 2.5 01/06/2017   Controlled, no change in medication    Hypothyroidism Controlled, no change in medication   ALLERGIC RHINITIS, SEASONAL Uncontrolled add astelin  Nocturia more than twice per night Reports frequency on avg 4 times per night , large amounts, disturbing sleep, causing fatigue , developed over  past 4 months. Needs to re establish with urology who he a snot seen in over 1 year. Already on medication for OAB Encouraged to ensure fluid intake primarily occurs by 7pm

## 2017-02-24 NOTE — Assessment & Plan Note (Signed)
Uncontrolled add astelin

## 2017-02-25 DIAGNOSIS — R351 Nocturia: Secondary | ICD-10-CM | POA: Insufficient documentation

## 2017-02-25 NOTE — Assessment & Plan Note (Signed)
Reports frequency on avg 4 times per night , large amounts, disturbing sleep, causing fatigue , developed over past 4 months. Needs to re establish with urology who he a snot seen in over 1 year. Already on medication for OAB Encouraged to ensure fluid intake primarily occurs by 7pm

## 2017-03-28 ENCOUNTER — Other Ambulatory Visit: Payer: Self-pay | Admitting: Family Medicine

## 2017-04-22 ENCOUNTER — Other Ambulatory Visit: Payer: Self-pay | Admitting: Family Medicine

## 2017-05-01 ENCOUNTER — Other Ambulatory Visit: Payer: Self-pay | Admitting: Family Medicine

## 2017-06-23 ENCOUNTER — Other Ambulatory Visit: Payer: Self-pay | Admitting: Family Medicine

## 2017-06-23 NOTE — Telephone Encounter (Signed)
Seen [phone] 

## 2017-07-11 DIAGNOSIS — I10 Essential (primary) hypertension: Secondary | ICD-10-CM | POA: Diagnosis not present

## 2017-07-11 DIAGNOSIS — E785 Hyperlipidemia, unspecified: Secondary | ICD-10-CM | POA: Diagnosis not present

## 2017-07-12 LAB — COMPLETE METABOLIC PANEL WITH GFR
AG Ratio: 1.4 (calc) (ref 1.0–2.5)
ALBUMIN MSPROF: 4.1 g/dL (ref 3.6–5.1)
ALKALINE PHOSPHATASE (APISO): 56 U/L (ref 40–115)
ALT: 12 U/L (ref 9–46)
AST: 16 U/L (ref 10–35)
BUN: 14 mg/dL (ref 7–25)
CO2: 30 mmol/L (ref 20–32)
CREATININE: 1.17 mg/dL (ref 0.70–1.18)
Calcium: 8.9 mg/dL (ref 8.6–10.3)
Chloride: 104 mmol/L (ref 98–110)
GFR, Est African American: 69 mL/min/{1.73_m2} (ref >=60)
GFR, Est Non African American: 59 mL/min/{1.73_m2} — ABNORMAL LOW (ref >=60)
Globulin: 3 g/dL (calc) (ref 1.9–3.7)
Glucose, Bld: 98 mg/dL (ref 65–99)
Potassium: 4.3 mmol/L (ref 3.5–5.3)
Sodium: 142 mmol/L (ref 135–146)
Total Bilirubin: 0.9 mg/dL (ref 0.2–1.2)
Total Protein: 7.1 g/dL (ref 6.1–8.1)

## 2017-07-12 LAB — HEMOGLOBIN A1C
EAG (MMOL/L): 7 (calc)
HEMOGLOBIN A1C: 6 %{Hb} — AB (ref <5.7)
MEAN PLASMA GLUCOSE: 126 (calc)

## 2017-07-12 LAB — TSH: TSH: 4.08 mIU/L (ref 0.40–4.50)

## 2017-07-15 ENCOUNTER — Ambulatory Visit (INDEPENDENT_AMBULATORY_CARE_PROVIDER_SITE_OTHER): Payer: PPO | Admitting: Family Medicine

## 2017-07-15 ENCOUNTER — Encounter: Payer: Self-pay | Admitting: Family Medicine

## 2017-07-15 VITALS — BP 130/78 | HR 83 | Temp 98.6°F | Resp 16 | Ht 67.0 in | Wt 160.2 lb

## 2017-07-15 DIAGNOSIS — I10 Essential (primary) hypertension: Secondary | ICD-10-CM

## 2017-07-15 DIAGNOSIS — E785 Hyperlipidemia, unspecified: Secondary | ICD-10-CM | POA: Diagnosis not present

## 2017-07-15 DIAGNOSIS — E039 Hypothyroidism, unspecified: Secondary | ICD-10-CM

## 2017-07-15 DIAGNOSIS — R351 Nocturia: Secondary | ICD-10-CM | POA: Diagnosis not present

## 2017-07-15 DIAGNOSIS — R221 Localized swelling, mass and lump, neck: Secondary | ICD-10-CM

## 2017-07-15 DIAGNOSIS — R7303 Prediabetes: Secondary | ICD-10-CM

## 2017-07-15 DIAGNOSIS — L723 Sebaceous cyst: Secondary | ICD-10-CM

## 2017-07-15 DIAGNOSIS — Z1211 Encounter for screening for malignant neoplasm of colon: Secondary | ICD-10-CM | POA: Diagnosis not present

## 2017-07-15 DIAGNOSIS — Z0000 Encounter for general adult medical examination without abnormal findings: Secondary | ICD-10-CM

## 2017-07-15 LAB — HEMOCCULT GUIAC POC 1CARD (OFFICE): FECAL OCCULT BLD: NEGATIVE

## 2017-07-15 NOTE — Patient Instructions (Addendum)
F/u with nurse for wellness visit in end February  We will try to get appt date for Dr Blair Dolphin for you   MD visit first week in April  You are referred to Dr Arnoldo Morale in [place] re mass at back of neck      We will add lipid and PSA  To recent labs  You need fasting lipid, cmp and eGFr, hBa1C tSH , cbc  March 27 or shortly after  Thank you  for choosing [facility]. We consider it a privelige to serve you.  Delivering excellent health care in a caring and  compassionate way is our goal.  Partnering with you,  so that together we can achieve this goal is our strategy.

## 2017-07-20 NOTE — Assessment & Plan Note (Signed)
Large sebaceous cyst on upper back and posterior neck over 10 cm diameter, reportedly increasing over years in size. Needs removal. Refer to Dr Arnoldo Morale

## 2017-07-20 NOTE — Assessment & Plan Note (Signed)
Annual exam as documented. Counseling done  re healthy lifestyle involving commitment to 150 minutes exercise per week, heart healthy diet, and attaining healthy weight.The importance of adequate sleep also discussed.  Immunization and cancer screening needs are specifically addressed at this visit.  

## 2017-07-20 NOTE — Progress Notes (Signed)
   [name]     MRN: [id number]      DOB: [birth year]   HPI: Patient is in for annual physical exam. No other health concerns are expressed or addressed at the visit. Recent labs, i are reviewed. Immunization is reviewed , patients is allergic to eggs and cannot have the influenza vaccine  PE; BP 130/78 (BP Location: Left Arm, Patient Position: Sitting, Cuff Size: Normal)   Pulse 83   Temp 98.6 F (37 C) (Other (Comment))   Resp 16   Ht 5\' 7"  (1.702 m)   Wt 160 lb 4 oz (72.7 kg)   SpO2 97%   BMI 25.10 kg/m   Pleasant male, alert and oriented x 3, in no cardio-pulmonary distress. Afebrile. HEENT No facial trauma or asymetry. Sinuses non tender. EOMI, pupils equally reactive to light. External ears normal, tympanic membranes clear. Oropharynx moist, no exudate. Neck: supple, no adenopathy,JVD or thyromegaly.No bruits.  Chest: Clear to ascultation bilaterally.No crackles or wheezes. Non tender to palpation  Breast: No asymetry,no masses. No nipple discharge or inversion. No axillary or supraclavicular adenopathy  Cardiovascular system; Heart sounds normal,  S1 and  S2 ,no S3.  No murmur, or thrill. Apical beat not displaced Peripheral pulses normal.  Abdomen: Soft, non tender, no organomegaly or masses. No bruits. Bowel sounds normal. No guarding, tenderness or rebound.  Rectal:  Normal sphincter tone. No hemorrhoids or  masses. guaiac negative stool. Prostate smooth and firm    Musculoskeletal exam: Decreased though adequate  ROM of spine, hips , shoulders and knees. No deformity ,swelling or crepitus noted. No muscle wasting or atrophy.   Neurologic: Cranial nerves 2 to 12 intact. Power, tone ,sensation and reflexes normal throughout. No disturbance in gait. No tremor.  Skin: Intact, no ulceration, erythema , scaling or rash noted. Pigmentation normal throughout Extremal;y large cyst on upper back and posterior neck , reports continual  increase in size, non tender  Psych; Normal mood and affect. Judgement and concentration normal   Assessment & Plan:  Annual physical exam Annual exam as documented. Counseling done  re healthy lifestyle involving commitment to 150 minutes exercise per week, heart healthy diet, and attaining healthy weight.The importance of adequate sleep also discussed.  Immunization and cancer screening needs are specifically addressed at this visit.   Mass in neck Large sebaceous cyst on upper back and posterior neck over 10 cm diameter, reportedly increasing over years in size. Needs removal. Refer to Dr Arnoldo Morale  Nocturia more than twice per night No appt as yet, will follow up, noted pt has billing issue which he denied during visit , will unfortunately need to request our office staff to make pt aware of his need to call urology again to sort this out

## 2017-07-20 NOTE — Assessment & Plan Note (Signed)
No appt as yet, will follow up, noted pt has billing issue which he denied during visit , will unfortunately need to request our office staff to make pt aware of his need to call urology again to sort this out

## 2017-08-04 ENCOUNTER — Other Ambulatory Visit: Payer: Self-pay | Admitting: Family Medicine

## 2017-08-05 NOTE — Telephone Encounter (Signed)
Seen 10 2 18

## 2017-08-07 ENCOUNTER — Ambulatory Visit (INDEPENDENT_AMBULATORY_CARE_PROVIDER_SITE_OTHER): Payer: PPO | Admitting: General Surgery

## 2017-08-07 ENCOUNTER — Encounter: Payer: Self-pay | Admitting: General Surgery

## 2017-08-07 VITALS — BP 156/102 | HR 83 | Temp 97.7°F | Resp 18 | Ht 67.0 in | Wt 164.0 lb

## 2017-08-07 DIAGNOSIS — L723 Sebaceous cyst: Secondary | ICD-10-CM | POA: Diagnosis not present

## 2017-08-07 NOTE — Patient Instructions (Signed)
Epidermal Cyst An epidermal cyst is a small, painless lump under your skin. It may be called an epidermal inclusion cyst or an infundibular cyst. The cyst contains a grayish-white, bad-smelling substance (keratin). It is important not to pop epidermal cysts yourself. These cysts are usually harmless (benign), but they can get infected. Symptoms of infection may include:  Redness.  Inflammation.  Tenderness.  Warmth.  Fever.  A grayish-white, bad-smelling substance draining from the cyst.  Pus draining from the cyst.  Follow these instructions at home:  Take over-the-counter and prescription medicines only as told by your doctor.  If you were prescribed an antibiotic, use it as told by your doctor. Do not stop using the antibiotic even if you start to feel better.  Keep the area around your cyst clean and dry.  Wear loose, dry clothing.  Do not try to pop your cyst.  Avoid touching your cyst.  Check your cyst every day for signs of infection.  Keep all follow-up visits as told by your doctor. This is important. How is this prevented?  Wear clean, dry, clothing.  Avoid wearing tight clothing.  Keep your skin clean and dry. Shower or take baths every day.  Wash your body with a benzoyl peroxide wash when you shower or bathe. Contact a health care provider if:  Your cyst has symptoms of infection.  Your condition is not improving or is getting worse.  You have a cyst that looks different from other cysts you have had.  You have a fever. Get help right away if:  Redness spreads from the cyst into the surrounding area. This information is not intended to replace advice given to you by your health care provider. Make sure you discuss any questions you have with your health care provider. Document Released: 11/07/2004 Document Revised: 05/29/2016 Document Reviewed: 08/02/2015 Elsevier Interactive Patient Education  2018 [employer]vier Inc[employer]  

## 2017-08-07 NOTE — Progress Notes (Signed)
[name]; [id number]; [birth year]   HPI Patient is a 76 year old black male who is referred to my care by Dr. Moshe Cipro for evaluation and treatment of a sebaceous cyst on his neck. It is been present for some time now, but is a causing increasing discomfort. He has had it incised in the past. He currently has no drainage. He currently has no pain. Past Medical History:  Diagnosis Date  . Allergic rhinitis, seasonal   . BPH (benign prostatic hypertrophy)   . Erectile dysfunction   . Hx of thyroidectomy   . Hyperlipidemia   . Hypertension   . Hypothyroidism   . Prostate cancer ([place]) 2011   treated with radiation  . Urosepsis 07/2011   ICU admission [facility]    Past Surgical History:  Procedure Laterality Date  . COLONOSCOPY N/A 09/03/2013   Procedure: COLONOSCOPY;  Surgeon: Rogene Houston, MD;  [facility];  Service: Endoscopy;  Laterality: N/A;  1225  . COLONOSCOPY N/A 11/08/2015   Procedure: COLONOSCOPY;  Surgeon: Rogene Houston, MD;  Location: [facility];  Service: Endoscopy;  Laterality: N/A;  1200  . PROSTATE SURGERY    . THYROIDECTOMY  2003  . transurethral resection of thr prostate  2003  . ureteral stone extraction  08/2011    Family History  Problem Relation Age of Onset  . Stroke Mother   . Diabetes Father   . Hypertension Father   . Kidney disease Brother   . Stroke Brother   . Diabetes Brother   . Gout Brother   . Diabetes Brother     Current Outpatient Prescriptions on File Prior to Visit  Medication Sig Dispense Refill  . amLODipine (NORVASC) 10 MG tablet TAKE ONE TABLET BY MOUTH ONCE DAILY. 90 tablet 0  . aspirin EC 81 MG tablet Take 1 tablet (81 mg total) by mouth daily. 30 tablet 5  . cetirizine (ZYRTEC ALLERGY) 10 MG tablet Take 10 mg by mouth daily.     . hydrochlorothiazide (MICROZIDE) 12.5 MG capsule TAKE ONE CAPSULE BY MOUTH ONCE DAILY. 90 capsule 0  . levothyroxine (SYNTHROID, LEVOTHROID) 75 MCG tablet TAKE 1 TABLET BY MOUTH  ONCE A DAY. 90 tablet 0  . lovastatin (MEVACOR) 40 MG tablet TAKE 1 TABLET BY MOUTH ONCE A DAY. 90 tablet 0  . Multiple Vitamin (MULTIVITAMIN WITH MINERALS) TABS tablet Take 1 tablet by mouth daily.    Marland Kitchen oxybutynin (DITROPAN) 5 MG tablet TAKE 1 TABLET BY MOUTH ONCE A DAY. 90 tablet 1  . oxybutynin (DITROPAN) 5 MG tablet TAKE 1 TABLET BY MOUTH ONCE DAILY. 90 tablet 0  . potassium chloride SA (K-DUR,KLOR-CON) 20 MEQ tablet TAKE 1 TABLET BY MOUTH ONCE A DAY. 90 tablet 0  . sildenafil (REVATIO) 20 MG tablet TAKE 3 TABLETS BY MOUTH DAILY. 90 tablet 0  . Wheat Dextrin (BENEFIBER DRINK MIX) PACK Take 4 g by mouth at bedtime. (Patient not taking: Reported on 01/09/2017)    . [DISCONTINUED] oxybutynin (DITROPAN) 5 MG tablet TAKE 1 TABLET BY MOUTH EVERY DAY FOR URINARY INCONTINENCE 90 tablet 1   No current facility-administered medications on file prior to visit.     Allergies  Allergen Reactions  . Peanut-Containing Drug Products Shortness Of Breath and Swelling  . Eggs Or Egg-Derived Products Swelling  . Iodine Swelling  . Shellfish Allergy Swelling  . Penicillins Hives and Rash    Has patient had a PCN reaction causing immediate rash, facial/tongue/throat swelling, SOB or lightheadedness with hypotension: No  Has patient had a PCN reaction causing severe rash involving mucus membranes or skin necrosis: Yes Has patient had a PCN reaction that required hospitalization No Has patient had a PCN reaction occurring within the last 10 years: No If all of the above answers are "NO", then may proceed with Cephalosporin use.     History  Alcohol Use No    History  Smoking Status  . Never Smoker  Smokeless Tobacco  . Never Used    Review of Systems  Constitutional: Positive for malaise/fatigue.  HENT: Negative.   Eyes: Negative.   Respiratory: Negative.   Cardiovascular: Negative.   Gastrointestinal: Positive for heartburn.  Genitourinary: Positive for frequency.  Musculoskeletal:  Negative.   Skin: Negative.   Neurological: Negative.   Endo/Heme/Allergies: Negative.   Psychiatric/Behavioral: Negative.     Objective   Vitals:   08/07/17 1058  BP: (!) 156/102  Pulse: 83  Resp: 18  Temp: 97.7 F (36.5 C)    Physical Exam  Constitutional: He is oriented to person, place, and time and well-developed, well-nourished, and in no distress.  HENT:  Head: Normocephalic and atraumatic.  Neck:  Large 8 cm ovoid sebaceous cyst present with punctum. Tender to touch. No erythema noted.  Cardiovascular: Normal rate, regular rhythm and normal heart sounds.  Exam reveals no gallop and no friction rub.   No murmur heard. Pulmonary/Chest: Effort normal and breath sounds normal. No respiratory distress. He has no wheezes. He has no rales.  Neurological: He is alert and oriented to person, place, and time.  Vitals reviewed.  Dr. Griffin Dakin notes reviewed.  Assessment  Sebaceous cyst, neck Plan   Patient is scheduled for excision of the sebaceous cyst, neck on 08/15/2017. Risks and benefits of the procedure including recurrence of the cyst were fully explained to the patient, who gave informed consent.

## 2017-08-07 NOTE — H&P (Signed)
[name]; [id number]; December 19, 1940   HPI Patient is a 76 year old black male who is referred to my care by Dr. Moshe Cooper for evaluation and treatment of a sebaceous cyst on his neck. It is been present for some time now, but is a causing increasing discomfort. He has had it incised in the past. He currently has no drainage. He currently has no pain. Past Medical History:  Diagnosis Date  . Allergic rhinitis, seasonal   . BPH (benign prostatic hypertrophy)   . Erectile dysfunction   . Hx of thyroidectomy   . Hyperlipidemia   . Hypertension   . Hypothyroidism   . Prostate cancer ([place]) 2011   treated with radiation  . Urosepsis 07/2011   ICU admission [facility]    Past Surgical History:  Procedure Laterality Date  . COLONOSCOPY N/A 09/03/2013   Procedure: COLONOSCOPY;  Surgeon: Paul Houston, MD;  [facility];  Service: Endoscopy;  Laterality: N/A;  1225  . COLONOSCOPY N/A 11/08/2015   Procedure: COLONOSCOPY;  Surgeon: Paul Houston, MD;  [facility];  Service: Endoscopy;  Laterality: N/A;  1200  . PROSTATE SURGERY    . THYROIDECTOMY  2003  . transurethral resection of thr prostate  2003  . ureteral stone extraction  08/2011    Family History  Problem Relation Age of Onset  . Stroke Mother   . Diabetes Father   . Hypertension Father   . Kidney disease Brother   . Stroke Brother   . Diabetes Brother   . Gout Brother   . Diabetes Brother     Current Outpatient Prescriptions on File Prior to Visit  Medication Sig Dispense Refill  . amLODipine (NORVASC) 10 MG tablet TAKE ONE TABLET BY MOUTH ONCE DAILY. 90 tablet 0  . aspirin EC 81 MG tablet Take 1 tablet (81 mg total) by mouth daily. 30 tablet 5  . cetirizine (ZYRTEC ALLERGY) 10 MG tablet Take 10 mg by mouth daily.     . hydrochlorothiazide (MICROZIDE) 12.5 MG capsule TAKE ONE CAPSULE BY MOUTH ONCE DAILY. 90 capsule 0  . levothyroxine (SYNTHROID, LEVOTHROID) 75 MCG tablet TAKE 1 TABLET BY MOUTH  ONCE A DAY. 90 tablet 0  . lovastatin (MEVACOR) 40 MG tablet TAKE 1 TABLET BY MOUTH ONCE A DAY. 90 tablet 0  . Multiple Vitamin (MULTIVITAMIN WITH MINERALS) TABS tablet Take 1 tablet by mouth daily.    Marland Kitchen oxybutynin (DITROPAN) 5 MG tablet TAKE 1 TABLET BY MOUTH ONCE A DAY. 90 tablet 1  . oxybutynin (DITROPAN) 5 MG tablet TAKE 1 TABLET BY MOUTH ONCE DAILY. 90 tablet 0  . potassium chloride SA (K-DUR,KLOR-CON) 20 MEQ tablet TAKE 1 TABLET BY MOUTH ONCE A DAY. 90 tablet 0  . sildenafil (REVATIO) 20 MG tablet TAKE 3 TABLETS BY MOUTH DAILY. 90 tablet 0  . Wheat Dextrin (BENEFIBER DRINK MIX) PACK Take 4 g by mouth at bedtime. (Patient not taking: Reported on 01/09/2017)    . [DISCONTINUED] oxybutynin (DITROPAN) 5 MG tablet TAKE 1 TABLET BY MOUTH EVERY DAY FOR URINARY INCONTINENCE 90 tablet 1   No current facility-administered medications on file prior to visit.     Allergies  Allergen Reactions  . Peanut-Containing Drug Products Shortness Of Breath and Swelling  . Eggs Or Egg-Derived Products Swelling  . Iodine Swelling  . Shellfish Allergy Swelling  . Penicillins Hives and Rash    Has patient had a PCN reaction causing immediate rash, facial/tongue/throat swelling, SOB or lightheadedness with hypotension: No  Has patient had a PCN reaction causing severe rash involving mucus membranes or skin necrosis: Yes Has patient had a PCN reaction that required hospitalization No Has patient had a PCN reaction occurring within the last 10 years: No If all of the above answers are "NO", then may proceed with Cephalosporin use.     History  Alcohol Use No    History  Smoking Status  . Never Smoker  Smokeless Tobacco  . Never Used    Review of Systems  Constitutional: Positive for malaise/fatigue.  HENT: Negative.   Eyes: Negative.   Respiratory: Negative.   Cardiovascular: Negative.   Gastrointestinal: Positive for heartburn.  Genitourinary: Positive for frequency.  Musculoskeletal:  Negative.   Skin: Negative.   Neurological: Negative.   Endo/Heme/Allergies: Negative.   Psychiatric/Behavioral: Negative.     Objective   Vitals:   08/07/17 1058  BP: (!) 156/102  Pulse: 83  Resp: 18  Temp: 97.7 F (36.5 C)    Physical Exam  Constitutional: He is oriented to person, place, and time and well-developed, well-nourished, and in no distress.  HENT:  Head: Normocephalic and atraumatic.  Neck:  Large 8 cm ovoid sebaceous cyst present with punctum. Tender to touch. No erythema noted.  Cardiovascular: Normal rate, regular rhythm and normal heart sounds.  Exam reveals no gallop and no friction rub.   No murmur heard. Pulmonary/Chest: Effort normal and breath sounds normal. No respiratory distress. He has no wheezes. He has no rales.  Neurological: He is alert and oriented to person, place, and time.  Vitals reviewed.  Dr. Griffin Cooper notes reviewed.  Assessment  Sebaceous cyst, neck Plan   Patient is scheduled for excision of the sebaceous cyst, neck on 08/15/2017. Risks and benefits of the procedure including recurrence of the cyst were fully explained to the patient, who gave informed consent.

## 2017-08-11 NOTE — Patient Instructions (Addendum)
[name]  08/11/2017     @PREFPERIOPPHARMACY @   Your procedure is scheduled on 08/15/2017.  Report to [facility] at 6:15 A.M.  Call this number if you have problems the morning of surgery:  [phone]   Remember:  Do not eat food or drink liquids after midnight.  Take these medicines the morning of surgery with A SIP OF WATER : Norvasc, Zyrtec, Microzide, Synthroid and Revatio   Do not wear jewelry, make-up or nail polish.  Do not wear lotions, powders, or perfumes, or deoderant.  Do not shave 48 hours prior to surgery.  Men may shave face and neck.  Do not bring valuables to the hospital.  [facility] is not responsible for any belongings or valuables.  Contacts, dentures or bridgework may not be worn into surgery.  Leave your suitcase in the car.  After surgery it may be brought to your room.  For patients admitted to the hospital, discharge time will be determined by your treatment team.  Patients discharged the day of surgery will not be allowed to drive home.   Name and phone number of your driver:   family Special instructions:   Epidermal Cyst An epidermal cyst is sometimes called an epidermal inclusion cyst or an infundibular cyst. It is a sac made of skin tissue. The sac contains a substance called keratin. Keratin is a protein that is normally secreted through the hair follicles. When keratin becomes trapped in the top layer of skin (epidermis), it can form an epidermal cyst. Epidermal cysts are usually found on the face, neck, trunk, and genitals. These cysts are usually harmless (benign), and they may not cause symptoms unless they become infected. It is important not to pop epidermal cysts yourself. What are the causes? This condition may be caused by:  A blocked hair follicle.  A hair that curls and re-enters the skin instead of growing straight out of the skin (ingrown hair).  A blocked pore.  Irritated skin.  An injury to the  skin.  Certain conditions that are passed along from parent to child (inherited).  Human papillomavirus (HPV).  What increases the risk? The following factors may make you more likely to develop an epidermal cyst:  Having acne.  Being overweight.  Wearing tight clothing.  What are the signs or symptoms? The only symptom of this condition may be a small, painless lump underneath the skin. When an epidermal cyst becomes infected, symptoms may include:  Redness.  Inflammation.  Tenderness.  Warmth.  Fever.  Keratin draining from the cyst. Keratin may look like a grayish-white, bad-smelling substance.  Pus draining from the cyst.  How is this diagnosed? This condition is diagnosed with a physical exam. In some cases, you may have a sample of tissue (biopsy) taken from your cyst to be examined under a microscope or tested for bacteria. You may be referred to a health care provider who specializes in skin care (dermatologist). How is this treated? In many cases, epidermal cysts go away on their own without treatment. If a cyst becomes infected, treatment may include:  Opening and draining the cyst. After draining, minor surgery to remove the rest of the cyst may be done.  Antibiotic medicine to help prevent infection.  Injections of medicines (steroids) that help to reduce inflammation.  Surgery to remove the cyst. Surgery may be done if: ? The cyst becomes large. ? The cyst bothers you. ? There is a chance that the cyst could turn into  cancer.  Follow these instructions at home:  Take over-the-counter and prescription medicines only as told by your health care provider.  If you were prescribed an antibiotic, use it as told by your health care provider. Do not stop using the antibiotic even if you start to feel better.  Keep the area around your cyst clean and dry.  Wear loose, dry clothing.  Do not try to pop your cyst.  Avoid touching your cyst.  Check your  cyst every day for signs of infection.  Keep all follow-up visits as told by your health care provider. This is important. How is this prevented?  Wear clean, dry, clothing.  Avoid wearing tight clothing.  Keep your skin clean and dry. Shower or take baths every day.  Wash your body with a benzoyl peroxide wash when you shower or bathe. Contact a health care provider if:  Your cyst develops symptoms of infection.  Your condition is not improving or is getting worse.  You develop a cyst that looks different from other cysts you have had.  You have a fever. Get help right away if:  Redness spreads from the cyst into the surrounding area. This information is not intended to replace advice given to you by your health care provider. Make sure you discuss any questions you have with your health care provider. Document Released: 08/31/2004 Document Revised: 05/29/2016 Document Reviewed: 08/02/2015 Elsevier Interactive Patient Education  [employer].   Please read over the following fact sheets that you were given. Care and Recovery After Surgery

## 2017-08-12 ENCOUNTER — Encounter (HOSPITAL_COMMUNITY)
Admission: RE | Admit: 2017-08-12 | Discharge: 2017-08-12 | Disposition: A | Discharge status: Home / Self Care | Payer: PPO | Source: Ambulatory Visit | Attending: General Surgery | Admitting: General Surgery

## 2017-08-12 ENCOUNTER — Other Ambulatory Visit: Payer: Self-pay

## 2017-08-12 ENCOUNTER — Encounter (HOSPITAL_COMMUNITY): Payer: Self-pay

## 2017-08-12 DIAGNOSIS — Z7982 Long term (current) use of aspirin: Secondary | ICD-10-CM | POA: Diagnosis not present

## 2017-08-12 DIAGNOSIS — L723 Sebaceous cyst: Secondary | ICD-10-CM | POA: Diagnosis not present

## 2017-08-12 DIAGNOSIS — Z8546 Personal history of malignant neoplasm of prostate: Secondary | ICD-10-CM | POA: Diagnosis not present

## 2017-08-12 DIAGNOSIS — Z923 Personal history of irradiation: Secondary | ICD-10-CM | POA: Diagnosis not present

## 2017-08-12 DIAGNOSIS — I10 Essential (primary) hypertension: Secondary | ICD-10-CM | POA: Diagnosis not present

## 2017-08-12 DIAGNOSIS — N400 Enlarged prostate without lower urinary tract symptoms: Secondary | ICD-10-CM | POA: Diagnosis not present

## 2017-08-12 DIAGNOSIS — Z79899 Other long term (current) drug therapy: Secondary | ICD-10-CM | POA: Diagnosis not present

## 2017-08-12 DIAGNOSIS — E785 Hyperlipidemia, unspecified: Secondary | ICD-10-CM | POA: Diagnosis not present

## 2017-08-12 DIAGNOSIS — E039 Hypothyroidism, unspecified: Secondary | ICD-10-CM | POA: Diagnosis not present

## 2017-08-12 HISTORY — DX: Prediabetes: R73.03

## 2017-08-12 HISTORY — DX: Personal history of urinary calculi: Z87.442

## 2017-08-12 LAB — CBC WITH DIFFERENTIAL/PLATELET
BASOS PCT: 1 %
Basophils Absolute: 0 10*3/uL (ref 0.0–0.1)
Eosinophils Absolute: 0.5 10*3/uL (ref 0.0–0.7)
Eosinophils Relative: 8 %
HEMATOCRIT: 46.4 % (ref 39.0–52.0)
HEMOGLOBIN: 15 g/dL (ref 13.0–17.0)
LYMPHS PCT: 30 %
Lymphs Abs: 1.8 10*3/uL (ref 0.7–4.0)
MCH: 27.6 pg (ref 26.0–34.0)
MCHC: 32.3 g/dL (ref 30.0–36.0)
MCV: 85.3 fL (ref 78.0–100.0)
MONO ABS: 0.5 10*3/uL (ref 0.1–1.0)
MONOS PCT: 9 %
NEUTROS ABS: 3.1 10*3/uL (ref 1.7–7.7)
NEUTROS PCT: 52 %
Platelets: 188 10*3/uL (ref 150–400)
RBC: 5.44 MIL/uL (ref 4.22–5.81)
RDW: 15.1 % (ref 11.5–15.5)
WBC: 5.9 10*3/uL (ref 4.0–10.5)

## 2017-08-12 LAB — BASIC METABOLIC PANEL
Anion gap: 11 (ref 5–15)
BUN: 12 mg/dL (ref 6–20)
CALCIUM: 9.4 mg/dL (ref 8.9–10.3)
CO2: 29 mmol/L (ref 22–32)
Chloride: 100 mmol/L — ABNORMAL LOW (ref 101–111)
Creatinine, Ser: 1.19 mg/dL (ref 0.61–1.24)
GFR calc non Af Amer: 57 mL/min — ABNORMAL LOW (ref >60)
Glucose, Bld: 108 mg/dL — ABNORMAL HIGH (ref 65–99)
Potassium: 3.7 mmol/L (ref 3.5–5.1)
SODIUM: 140 mmol/L (ref 135–145)

## 2017-08-15 ENCOUNTER — Encounter (HOSPITAL_COMMUNITY): Payer: Self-pay | Admitting: *Deleted

## 2017-08-15 ENCOUNTER — Ambulatory Visit (HOSPITAL_COMMUNITY): Payer: PPO | Admitting: Anesthesiology

## 2017-08-15 ENCOUNTER — Encounter (HOSPITAL_COMMUNITY)
Admission: RE | Disposition: A | Discharge status: Home / Self Care | Payer: Self-pay | Source: Ambulatory Visit | Attending: General Surgery

## 2017-08-15 ENCOUNTER — Ambulatory Visit (HOSPITAL_COMMUNITY)
Admission: RE | Admit: 2017-08-15 | Discharge: 2017-08-15 | Disposition: A | Discharge status: Home / Self Care | Payer: PPO | Source: Ambulatory Visit | Attending: General Surgery | Admitting: General Surgery

## 2017-08-15 DIAGNOSIS — N400 Enlarged prostate without lower urinary tract symptoms: Secondary | ICD-10-CM | POA: Diagnosis not present

## 2017-08-15 DIAGNOSIS — E785 Hyperlipidemia, unspecified: Secondary | ICD-10-CM | POA: Insufficient documentation

## 2017-08-15 DIAGNOSIS — Z8546 Personal history of malignant neoplasm of prostate: Secondary | ICD-10-CM | POA: Diagnosis not present

## 2017-08-15 DIAGNOSIS — Z79899 Other long term (current) drug therapy: Secondary | ICD-10-CM | POA: Insufficient documentation

## 2017-08-15 DIAGNOSIS — E039 Hypothyroidism, unspecified: Secondary | ICD-10-CM | POA: Insufficient documentation

## 2017-08-15 DIAGNOSIS — Z923 Personal history of irradiation: Secondary | ICD-10-CM | POA: Diagnosis not present

## 2017-08-15 DIAGNOSIS — L723 Sebaceous cyst: Secondary | ICD-10-CM | POA: Diagnosis not present

## 2017-08-15 DIAGNOSIS — I10 Essential (primary) hypertension: Secondary | ICD-10-CM | POA: Diagnosis not present

## 2017-08-15 DIAGNOSIS — Z7982 Long term (current) use of aspirin: Secondary | ICD-10-CM | POA: Diagnosis not present

## 2017-08-15 DIAGNOSIS — L729 Follicular cyst of the skin and subcutaneous tissue, unspecified: Secondary | ICD-10-CM | POA: Diagnosis not present

## 2017-08-15 HISTORY — PX: MASS EXCISION: SHX2000

## 2017-08-15 LAB — GLUCOSE, CAPILLARY
Glucose-Capillary: 107 mg/dL — ABNORMAL HIGH (ref 65–99)
Glucose-Capillary: 99 mg/dL (ref 65–99)

## 2017-08-15 SURGERY — EXCISION MASS
Anesthesia: General

## 2017-08-15 MED ORDER — MIDAZOLAM HCL 2 MG/2ML IJ SOLN
1.0000 mg | INTRAMUSCULAR | Status: AC
  Administered 2017-08-15: 2 mg via INTRAVENOUS

## 2017-08-15 MED ORDER — ROCURONIUM BROMIDE 100 MG/10ML IV SOLN
INTRAVENOUS | Status: DC | PRN
  Administered 2017-08-15: 5 mg via INTRAVENOUS
  Administered 2017-08-15: 15 mg via INTRAVENOUS

## 2017-08-15 MED ORDER — KETOROLAC TROMETHAMINE 30 MG/ML IJ SOLN
INTRAMUSCULAR | Status: AC
  Filled 2017-08-15: qty 1

## 2017-08-15 MED ORDER — VANCOMYCIN HCL IN DEXTROSE 1-5 GM/200ML-% IV SOLN
1000.0000 mg | INTRAVENOUS | Status: AC
  Administered 2017-08-15: 1000 mg via INTRAVENOUS
  Filled 2017-08-15: qty 200

## 2017-08-15 MED ORDER — SUCCINYLCHOLINE CHLORIDE 20 MG/ML IJ SOLN
INTRAMUSCULAR | Status: AC
Start: 2017-08-15 — End: ?
  Filled 2017-08-15: qty 1

## 2017-08-15 MED ORDER — CHLORHEXIDINE GLUCONATE CLOTH 2 % EX PADS: 6.0000 | MEDICATED_PAD | Freq: Once | CUTANEOUS | Status: DC

## 2017-08-15 MED ORDER — HYDROCODONE-ACETAMINOPHEN 5-325 MG PO TABS: 1.0000 | ORAL_TABLET | Freq: Four times a day (QID) | ORAL | 0 refills | Status: DC | PRN

## 2017-08-15 MED ORDER — LACTATED RINGERS IV SOLN
INTRAVENOUS | Status: DC
  Administered 2017-08-15: 1000 mL via INTRAVENOUS

## 2017-08-15 MED ORDER — GLYCOPYRROLATE 0.2 MG/ML IJ SOLN
INTRAMUSCULAR | Status: AC
  Filled 2017-08-15: qty 2

## 2017-08-15 MED ORDER — FENTANYL CITRATE (PF) 100 MCG/2ML IJ SOLN: 25.0000 ug | INTRAMUSCULAR | Status: DC | PRN

## 2017-08-15 MED ORDER — FENTANYL CITRATE (PF) 100 MCG/2ML IJ SOLN
INTRAMUSCULAR | Status: AC
  Filled 2017-08-15: qty 4

## 2017-08-15 MED ORDER — FENTANYL CITRATE (PF) 100 MCG/2ML IJ SOLN
INTRAMUSCULAR | Status: DC | PRN
  Administered 2017-08-15 (×2): 50 ug via INTRAVENOUS

## 2017-08-15 MED ORDER — LIDOCAINE HCL 1 % IJ SOLN
INTRAMUSCULAR | Status: DC | PRN
  Administered 2017-08-15: 25 mg via INTRADERMAL

## 2017-08-15 MED ORDER — BUPIVACAINE HCL (PF) 0.5 % IJ SOLN
INTRAMUSCULAR | Status: AC
  Filled 2017-08-15: qty 30

## 2017-08-15 MED ORDER — TRIPLE ANTIBIOTIC 3.5-400-5000 EX OINT
TOPICAL_OINTMENT | CUTANEOUS | Status: DC | PRN
  Administered 2017-08-15: 3.5 g via TOPICAL

## 2017-08-15 MED ORDER — PHENYLEPHRINE 40 MCG/ML (10ML) SYRINGE FOR IV PUSH (FOR BLOOD PRESSURE SUPPORT)
PREFILLED_SYRINGE | INTRAVENOUS | Status: AC
  Filled 2017-08-15: qty 10

## 2017-08-15 MED ORDER — KETOROLAC TROMETHAMINE 30 MG/ML IJ SOLN
30.0000 mg | Freq: Once | INTRAMUSCULAR | Status: AC
  Administered 2017-08-15: 30 mg via INTRAVENOUS

## 2017-08-15 MED ORDER — MIDAZOLAM HCL 2 MG/2ML IJ SOLN
INTRAMUSCULAR | Status: AC
  Filled 2017-08-15: qty 2

## 2017-08-15 MED ORDER — BACITRACIN-NEOMYCIN-POLYMYXIN 400-5-5000 EX OINT
TOPICAL_OINTMENT | CUTANEOUS | Status: AC
  Filled 2017-08-15: qty 1

## 2017-08-15 MED ORDER — ETOMIDATE 2 MG/ML IV SOLN
INTRAVENOUS | Status: DC | PRN
  Administered 2017-08-15: 12 mg via INTRAVENOUS

## 2017-08-15 MED ORDER — POVIDONE-IODINE 10 % EX OINT
TOPICAL_OINTMENT | CUTANEOUS | Status: AC
  Filled 2017-08-15: qty 1

## 2017-08-15 MED ORDER — SUCCINYLCHOLINE CHLORIDE 20 MG/ML IJ SOLN
INTRAMUSCULAR | Status: DC | PRN
  Administered 2017-08-15: 150 mg via INTRAVENOUS

## 2017-08-15 MED ORDER — GLYCOPYRROLATE 0.2 MG/ML IJ SOLN
INTRAMUSCULAR | Status: DC | PRN
  Administered 2017-08-15: 0.2 mg via INTRAVENOUS
  Administered 2017-08-15: 0.4 mg via INTRAVENOUS

## 2017-08-15 MED ORDER — BUPIVACAINE HCL (PF) 0.5 % IJ SOLN
INTRAMUSCULAR | Status: DC | PRN
  Administered 2017-08-15: 20 mL

## 2017-08-15 MED ORDER — ETOMIDATE 2 MG/ML IV SOLN
INTRAVENOUS | Status: AC
  Filled 2017-08-15: qty 10

## 2017-08-15 MED ORDER — PROPOFOL 10 MG/ML IV BOLUS
INTRAVENOUS | Status: AC
  Filled 2017-08-15: qty 40

## 2017-08-15 MED ORDER — DEXTROSE 5 % IV SOLN
INTRAVENOUS | Status: DC | PRN
  Administered 2017-08-15: 07:00:00 via INTRAVENOUS

## 2017-08-15 MED ORDER — NEOSTIGMINE METHYLSULFATE 10 MG/10ML IV SOLN
INTRAVENOUS | Status: DC | PRN
  Administered 2017-08-15: 2 mg via INTRAVENOUS

## 2017-08-15 MED ORDER — LIDOCAINE HCL (PF) 1 % IJ SOLN
INTRAMUSCULAR | Status: AC
  Filled 2017-08-15: qty 5

## 2017-08-15 MED ORDER — MIDAZOLAM HCL 5 MG/5ML IJ SOLN
INTRAMUSCULAR | Status: DC | PRN
  Administered 2017-08-15 (×2): 1 mg via INTRAVENOUS

## 2017-08-15 MED ORDER — GLYCOPYRROLATE 0.2 MG/ML IJ SOLN
INTRAMUSCULAR | Status: AC
  Filled 2017-08-15: qty 3

## 2017-08-15 MED ORDER — 0.9 % SODIUM CHLORIDE (POUR BTL) OPTIME
TOPICAL | Status: DC | PRN
  Administered 2017-08-15: 1000 mL

## 2017-08-15 MED ORDER — NEOSTIGMINE METHYLSULFATE 10 MG/10ML IV SOLN
INTRAVENOUS | Status: AC
Start: 2017-08-15 — End: ?
  Filled 2017-08-15: qty 1

## 2017-08-15 SURGICAL SUPPLY — 41 items
BAG HAMPER (MISCELLANEOUS) ×3 IMPLANT
BLADE SURG SZ11 CARB STEEL (BLADE) IMPLANT
CHLORAPREP W/TINT 10.5 ML (MISCELLANEOUS) ×3 IMPLANT
CLOTH BEACON ORANGE TIMEOUT ST (SAFETY) ×3 IMPLANT
COVER LIGHT HANDLE STERIS (MISCELLANEOUS) ×6 IMPLANT
DECANTER SPIKE VIAL GLASS SM (MISCELLANEOUS) ×3 IMPLANT
DERMABOND ADVANCED (GAUZE/BANDAGES/DRESSINGS)
DERMABOND ADVANCED .7 DNX12 (GAUZE/BANDAGES/DRESSINGS) IMPLANT
DRAPE EENT ADH APERT 31X51 STR (DRAPES) ×3 IMPLANT
DRSG TEGADERM 2.38X2.75 (GAUZE/BANDAGES/DRESSINGS) ×3 IMPLANT
ELECT NEEDLE TIP 2.8 STRL (NEEDLE) IMPLANT
ELECT REM PT RETURN 9FT ADLT (ELECTROSURGICAL) ×3
ELECTRODE REM PT RTRN 9FT ADLT (ELECTROSURGICAL) ×1 IMPLANT
FORMALIN 10 PREFIL 120ML (MISCELLANEOUS) IMPLANT
GAUZE SPONGE 4X4 12PLY STRL (GAUZE/BANDAGES/DRESSINGS) ×6 IMPLANT
GLOVE BIOGEL PI IND STRL 7.0 (GLOVE) ×3 IMPLANT
GLOVE BIOGEL PI IND STRL 7.5 (GLOVE) ×1 IMPLANT
GLOVE BIOGEL PI INDICATOR 7.0 (GLOVE) ×6
GLOVE BIOGEL PI INDICATOR 7.5 (GLOVE) ×2
GLOVE SURG SS PI 7.5 STRL IVOR (GLOVE) ×3 IMPLANT
GOWN STRL REUS W/ TWL XL LVL3 (GOWN DISPOSABLE) ×1 IMPLANT
GOWN STRL REUS W/TWL LRG LVL3 (GOWN DISPOSABLE) ×3 IMPLANT
GOWN STRL REUS W/TWL XL LVL3 (GOWN DISPOSABLE) ×2
KIT ROOM TURNOVER APOR (KITS) ×3 IMPLANT
MANIFOLD NEPTUNE II (INSTRUMENTS) ×3 IMPLANT
NEEDLE 22X1 1/2 OR ONLY (MISCELLANEOUS)
NEEDLE 22X1.5 STRL (OR ONLY) (MISCELLANEOUS) IMPLANT
NEEDLE HYPO 25X1 1.5 SAFETY (NEEDLE) ×3 IMPLANT
NS IRRIG 1000ML POUR BTL (IV SOLUTION) ×3 IMPLANT
PACK MINOR (CUSTOM PROCEDURE TRAY) ×3 IMPLANT
PAD ARMBOARD 7.5X6 YLW CONV (MISCELLANEOUS) ×3 IMPLANT
SET BASIN LINEN APH (SET/KITS/TRAYS/PACK) ×3 IMPLANT
SPONGE GAUZE 2X2 8PLY STER LF (GAUZE/BANDAGES/DRESSINGS) ×1
SPONGE GAUZE 2X2 8PLY STRL LF (GAUZE/BANDAGES/DRESSINGS) ×2 IMPLANT
SUT ETHILON 3 0 FSL (SUTURE) IMPLANT
SUT PROLENE 3 0 PS 2 (SUTURE) ×3 IMPLANT
SUT PROLENE 4 0 PS 2 18 (SUTURE) IMPLANT
SUT VIC AB 3-0 SH 27 (SUTURE)
SUT VIC AB 3-0 SH 27X BRD (SUTURE) IMPLANT
SUT VIC AB 4-0 PS2 27 (SUTURE) IMPLANT
SYR CONTROL 10ML LL (SYRINGE) ×3 IMPLANT

## 2017-08-15 NOTE — Op Note (Signed)
Patient:  [name]  DOB:  06/19/1941  MRN:  [id number]   Preop Diagnosis: Sebaceous cyst, neck, recurrent  Postop Diagnosis: Same  Procedure: Excision 5 cm sebaceous cyst, neck  Surgeon: Aviva Signs, MD  Anes: General  Indications: Patient is a 76 year old black male who presents with an enlarging tender sebaceous cyst on the back of his neck.  The risks and benefits of the procedure including bleeding, infection, and recurrence of the cyst were fully explained to the patient, who gave informed consent.  Procedure note: The patient was placed in the right lateral decubitus position after general anesthesia was administered.  The neck was prepped and draped using usual sterile technique with ChloraPrep.  Surgical site confirmation was performed.  An elliptical incision was made around the punctum of the sebaceous cyst which was on the left posterior aspect of the neck at the scalp line.  The cyst wall as well as contents were removed without difficulty.  They were disposed of.  The wound was irrigated with normal saline.  0.5% Sensorcaine was instilled into the surrounding wound.  The skin was closed using 3-0 Prolene interrupted sutures.  Triple antibiotic ointment was applied.  All tape needle counts were correct at the end of the procedure.  Patient was awakened and transferred to PACU in stable condition.  Complications: None  EBL: Minimal  Specimen: None

## 2017-08-15 NOTE — Anesthesia Postprocedure Evaluation (Signed)
Anesthesia Post Note  Patient: [name]  Procedure(s) Performed: EXCISION SEBACEOUS CYST NECK (N/A )  Patient location during evaluation: PACU Anesthesia Type: General Level of consciousness: awake and patient cooperative Pain management: pain level controlled Vital Signs Assessment: post-procedure vital signs reviewed and stable Respiratory status: spontaneous breathing, nonlabored ventilation and respiratory function stable Cardiovascular status: blood pressure returned to baseline Postop Assessment: no apparent nausea or vomiting Anesthetic complications: no     Last Vitals:  Vitals:   08/15/17 0730 08/15/17 0840  BP: 118/83 (P) 118/88  Resp: 19   Temp:  (P) 37.2 C  SpO2: 96%     Last Pain:  Vitals:   08/15/17 0840  TempSrc:   PainSc: (P) Asleep                 Bitha Fauteux J

## 2017-08-15 NOTE — Anesthesia Procedure Notes (Signed)
Procedure Name: Intubation Date/Time: 08/15/2017 7:44 AM Performed by: Charmaine Downs Pre-anesthesia Checklist: Patient being monitored, Suction available, Emergency Drugs available and Patient identified Patient Re-evaluated:Patient Re-evaluated prior to induction Oxygen Delivery Method: Circle system utilized Preoxygenation: Pre-oxygenation with 100% oxygen Induction Type: IV induction and Cricoid Pressure applied Ventilation: Two handed mask ventilation required Laryngoscope Size: Mac and 4 Grade View: Grade II Tube type: Oral Tube size: 8.0 mm Number of attempts: 1 Airway Equipment and Method: Stylet and Oral airway Placement Confirmation: ETT inserted through vocal cords under direct vision,  positive ETCO2 and breath sounds checked- equal and bilateral Secured at: 22 cm Tube secured with: Tape Dental Injury: Teeth and Oropharynx as per pre-operative assessment

## 2017-08-15 NOTE — Discharge Instructions (Signed)
Keep wound clean and dry daily with soap and water

## 2017-08-15 NOTE — Transfer of Care (Signed)
Immediate Anesthesia Transfer of Care Note  Patient: [name]  Procedure(s) Performed: EXCISION SEBACEOUS CYST NECK (N/A )  Patient Location: PACU  Anesthesia Type:General  Level of Consciousness: drowsy  Airway & Oxygen Therapy: Patient Spontanous Breathing and Patient connected to nasal cannula oxygen  Post-op Assessment: Report given to RN and Post -op Vital signs reviewed and stable  Post vital signs: Reviewed and stable  Last Vitals:  Vitals:   08/15/17 0725 08/15/17 0730  BP: 114/84 118/83  Resp: 16 19  Temp:    SpO2: 97% 96%    Last Pain:  Vitals:   08/15/17 0646  TempSrc: Oral      Patients Stated Pain Goal: 8 (12/75/17 0017)  Complications: No apparent anesthesia complications

## 2017-08-15 NOTE — Anesthesia Preprocedure Evaluation (Signed)
Anesthesia Evaluation  Patient identified by MRN, date of birth, ID band Patient awake    Reviewed: Allergy & Precautions, NPO status , Patient's Chart, lab work & pertinent test results  Airway Mallampati: III  TM Distance: >3 FB     Dental  (+) Teeth Intact   Pulmonary neg pulmonary ROS,    breath sounds clear to auscultation       Cardiovascular hypertension, Pt. on medications  Rhythm:Regular Rate:Normal     Neuro/Psych    GI/Hepatic negative GI ROS, Neg liver ROS,   Endo/Other  diabetes, Type 2Hypothyroidism   Renal/GU      Musculoskeletal negative musculoskeletal ROS (+)   Abdominal   Peds  Hematology negative hematology ROS (+)   Anesthesia Other Findings   Reproductive/Obstetrics negative OB ROS                             Anesthesia Physical Anesthesia Plan  ASA: III  Anesthesia Plan: General   Post-op Pain Management:    Induction: Intravenous  PONV Risk Score and Plan:   Airway Management Planned: Oral ETT  Additional Equipment:   Intra-op Plan:   Post-operative Plan: Extubation in OR  Informed Consent: I have reviewed the patients History and Physical, chart, labs and discussed the procedure including the risks, benefits and alternatives for the proposed anesthesia with the patient or authorized representative who has indicated his/her understanding and acceptance.     Plan Discussed with:   Anesthesia Plan Comments: (Lateral position NO PROPOFOL - egg allergy causes swelling Amidate induction)        Anesthesia Quick Evaluation

## 2017-08-15 NOTE — Interval H&P Note (Signed)
History and Physical Interval Note:  08/15/2017 7:21 AM  [name]  has presented today for surgery, with the diagnosis of sebaceous cyst neck  The various methods of treatment have been discussed with the patient and family. After consideration of risks, benefits and other options for treatment, the patient has consented to  Procedure(s): EXCISION SEBACEOUS CYST NECK (N/A) as a surgical intervention .  The patient's history has been reviewed, patient examined, no change in status, stable for surgery.  I have reviewed the patient's chart and labs.  Questions were answered to the patient's satisfaction.     Aviva Signs

## 2017-08-18 ENCOUNTER — Encounter (HOSPITAL_COMMUNITY): Payer: Self-pay | Admitting: General Surgery

## 2017-08-26 ENCOUNTER — Ambulatory Visit (INDEPENDENT_AMBULATORY_CARE_PROVIDER_SITE_OTHER): Payer: PPO | Admitting: General Surgery

## 2017-08-26 ENCOUNTER — Encounter: Payer: Self-pay | Admitting: General Surgery

## 2017-08-26 VITALS — BP 146/88 | HR 97 | Temp 98.2°F | Resp 18 | Ht 67.0 in | Wt 160.0 lb

## 2017-08-26 DIAGNOSIS — Z09 Encounter for follow-up examination after completed treatment for conditions other than malignant neoplasm: Secondary | ICD-10-CM

## 2017-08-26 NOTE — Progress Notes (Signed)
Subjective:     [name]  Status post excision of sebaceous cyst on back of neck.  Doing well.  Has no complaints.  Has no pain. Objective:    BP (!) 146/88   Pulse 97   Temp 98.2 F (36.8 C)   Resp 18   Ht 5\' 7"  (1.702 m)   Wt 160 lb (72.6 kg)   BMI 25.06 kg/m   General:  alert, cooperative and no distress  Neck incision healing well.  Sutures removed.     Assessment:    Doing well postoperatively.    Plan:   Resume normal activity.  Follow-up as needed.

## 2017-09-16 ENCOUNTER — Encounter: Payer: Self-pay | Admitting: Family Medicine

## 2017-09-26 ENCOUNTER — Other Ambulatory Visit: Payer: Self-pay | Admitting: Family Medicine

## 2017-10-27 ENCOUNTER — Other Ambulatory Visit: Payer: Self-pay | Admitting: Family Medicine

## 2017-10-30 ENCOUNTER — Other Ambulatory Visit: Payer: Self-pay | Admitting: Family Medicine

## 2017-11-14 ENCOUNTER — Telehealth: Payer: Self-pay | Admitting: Family Medicine

## 2017-11-14 NOTE — Telephone Encounter (Signed)
Please call and let pt know that his insurance will no longer cover the viagra ( generic) 20 mg tabs as he has been getting them   Please see what we can do to PA this for him. The urologist will not see him because of a balance is my understanding , so if we are able to p , then I will do so, if not , he will need to see urology

## 2017-11-17 NOTE — Telephone Encounter (Signed)
Do not have his insurance for meds on file. Message sent to [place] to fax info order if a PA can be done

## 2018-01-09 DIAGNOSIS — I10 Essential (primary) hypertension: Secondary | ICD-10-CM | POA: Diagnosis not present

## 2018-01-09 DIAGNOSIS — Z125 Encounter for screening for malignant neoplasm of prostate: Secondary | ICD-10-CM | POA: Diagnosis not present

## 2018-01-09 DIAGNOSIS — L723 Sebaceous cyst: Secondary | ICD-10-CM | POA: Diagnosis not present

## 2018-01-09 DIAGNOSIS — E039 Hypothyroidism, unspecified: Secondary | ICD-10-CM | POA: Diagnosis not present

## 2018-01-09 DIAGNOSIS — E785 Hyperlipidemia, unspecified: Secondary | ICD-10-CM | POA: Diagnosis not present

## 2018-01-09 DIAGNOSIS — R221 Localized swelling, mass and lump, neck: Secondary | ICD-10-CM | POA: Diagnosis not present

## 2018-01-09 DIAGNOSIS — R7303 Prediabetes: Secondary | ICD-10-CM | POA: Diagnosis not present

## 2018-01-13 ENCOUNTER — Encounter: Payer: Self-pay | Admitting: Family Medicine

## 2018-01-13 ENCOUNTER — Ambulatory Visit (INDEPENDENT_AMBULATORY_CARE_PROVIDER_SITE_OTHER): Payer: PPO | Admitting: Family Medicine

## 2018-01-13 VITALS — BP 128/80 | HR 92 | Resp 16 | Ht 67.0 in | Wt 162.0 lb

## 2018-01-13 DIAGNOSIS — I10 Essential (primary) hypertension: Secondary | ICD-10-CM | POA: Diagnosis not present

## 2018-01-13 DIAGNOSIS — E785 Hyperlipidemia, unspecified: Secondary | ICD-10-CM

## 2018-01-13 DIAGNOSIS — K029 Dental caries, unspecified: Secondary | ICD-10-CM | POA: Diagnosis not present

## 2018-01-13 DIAGNOSIS — R221 Localized swelling, mass and lump, neck: Secondary | ICD-10-CM | POA: Diagnosis not present

## 2018-01-13 DIAGNOSIS — E039 Hypothyroidism, unspecified: Secondary | ICD-10-CM | POA: Diagnosis not present

## 2018-01-13 LAB — COMPLETE METABOLIC PANEL WITH GFR
AG RATIO: 1.2 (calc) (ref 1.0–2.5)
ALT: 10 U/L (ref 9–46)
AST: 16 U/L (ref 10–35)
Albumin: 4.3 g/dL (ref 3.6–5.1)
Alkaline phosphatase (APISO): 63 U/L (ref 40–115)
BUN/Creatinine Ratio: 12 (calc) (ref 6–22)
BUN: 15 mg/dL (ref 7–25)
CALCIUM: 9.7 mg/dL (ref 8.6–10.3)
CHLORIDE: 105 mmol/L (ref 98–110)
CO2: 31 mmol/L (ref 20–32)
CREATININE: 1.29 mg/dL — AB (ref 0.70–1.18)
GFR, EST AFRICAN AMERICAN: 61 mL/min/{1.73_m2} (ref >=60)
GFR, Est Non African American: 52 mL/min/{1.73_m2} — ABNORMAL LOW (ref >=60)
GLOBULIN: 3.5 g/dL (ref 1.9–3.7)
Glucose, Bld: 105 mg/dL — ABNORMAL HIGH (ref 65–99)
Potassium: 4.3 mmol/L (ref 3.5–5.3)
Sodium: 140 mmol/L (ref 135–146)
Total Bilirubin: 0.7 mg/dL (ref 0.2–1.2)
Total Protein: 7.8 g/dL (ref 6.1–8.1)

## 2018-01-13 LAB — LIPID PANEL
CHOL/HDL RATIO: 2.5 (calc) (ref <5.0)
CHOLESTEROL: 155 mg/dL (ref <200)
HDL: 61 mg/dL (ref >40)
LDL CHOLESTEROL (CALC): 80 mg/dL
Non-HDL Cholesterol (Calc): 94 mg/dL (calc) (ref <130)
Triglycerides: 67 mg/dL (ref <150)

## 2018-01-13 LAB — CBC
HCT: 48.6 % (ref 38.5–50.0)
HEMOGLOBIN: 15.7 g/dL (ref 13.2–17.1)
MCH: 26.5 pg — ABNORMAL LOW (ref 27.0–33.0)
MCHC: 32.3 g/dL (ref 32.0–36.0)
MCV: 82.1 fL (ref 80.0–100.0)
MPV: 10.4 fL (ref 7.5–12.5)
PLATELETS: 236 10*3/uL (ref 140–400)
RBC: 5.92 10*6/uL — ABNORMAL HIGH (ref 4.20–5.80)
RDW: 12.8 % (ref 11.0–15.0)
WBC: 4.9 10*3/uL (ref 3.8–10.8)

## 2018-01-13 LAB — HEMOGLOBIN A1C
Hgb A1c MFr Bld: 6.1 % of total Hgb — ABNORMAL HIGH (ref <5.7)
MEAN PLASMA GLUCOSE: 128 (calc)
eAG (mmol/L): 7.1 (calc)

## 2018-01-13 LAB — PSA: PSA: 0.2 ng/mL (ref <=4.0)

## 2018-01-13 LAB — TSH: TSH: 4.86 m[IU]/L — AB (ref 0.40–4.50)

## 2018-01-13 MED ORDER — SILDENAFIL CITRATE 20 MG PO TABS: 60.0000 mg | ORAL_TABLET | Freq: Every day | ORAL | 1 refills | Status: DC

## 2018-01-13 MED ORDER — HYDROCHLOROTHIAZIDE 12.5 MG PO CAPS: 12.5000 mg | ORAL_CAPSULE | Freq: Every day | ORAL | 1 refills | Status: DC

## 2018-01-13 MED ORDER — AMLODIPINE BESYLATE 10 MG PO TABS: 10.0000 mg | ORAL_TABLET | Freq: Every day | ORAL | 1 refills | Status: DC

## 2018-01-13 MED ORDER — LOVASTATIN 40 MG PO TABS: 40.0000 mg | ORAL_TABLET | Freq: Every day | ORAL | 1 refills | Status: DC

## 2018-01-13 MED ORDER — LEVOTHYROXINE SODIUM 75 MCG PO TABS: 75.0000 ug | ORAL_TABLET | Freq: Every day | ORAL | 1 refills | Status: DC

## 2018-01-13 MED ORDER — CLINDAMYCIN HCL 300 MG PO CAPS: 300.0000 mg | ORAL_CAPSULE | Freq: Three times a day (TID) | ORAL | 0 refills | Status: DC

## 2018-01-13 NOTE — Assessment & Plan Note (Signed)
Right submaxillary lymphadenopathy, mildly tender

## 2018-01-13 NOTE — Assessment & Plan Note (Signed)
Adequate control, no med change, though TSH slightly elevated

## 2018-01-13 NOTE — Assessment & Plan Note (Signed)
Cleocin prescribed and pt advised to seek immediate dental care

## 2018-01-13 NOTE — Assessment & Plan Note (Signed)
Hyperlipidemia:Low fat diet discussed and encouraged.   Lipid Panel  Lab Results  Component Value Date   CHOL 155 01/09/2018   HDL 61 01/09/2018   LDLCALC 80 01/09/2018   TRIG 67 01/09/2018   CHOLHDL 2.5 01/09/2018   Controlled, no change in medication

## 2018-01-13 NOTE — Patient Instructions (Signed)
F/u with MD in 4 months, call if you need me sooner  Please keep appointment for wellness exam  You have an infected tooth in your right upper gum  Which needs immediate attention, and also a shaking tooth in your left gum Your right jaw is swollen I have prescribed an antibiotic for you You need to take this and you need to see a dentist TODAY  Your labs and exam are otherwise very good  I know you will fee;l beter and be healthier once youget your teeth taken care of

## 2018-01-13 NOTE — Progress Notes (Signed)
   [name]     MRN: [id number]      DOB: 05-02-1941   HPI [name] is here for follow up and re-evaluation of chronic medical conditions, medication management and review of any available recent lab and radiology data.  .   C/o shaky tooth in left upper jaw and intermittent painless swelling of right lower jaw x x 4 days, he denies any fever or chills or pain The PT denies any adverse reactions to current medications since the last visit.    ROS Denies recent fever or chills. Denies sinus pressure, nasal congestion, ear pain or sore throat. Denies chest congestion, productive cough or wheezing. Denies chest pains, palpitations and leg swelling Denies abdominal pain, nausea, vomiting,diarrhea or constipation.   Denies dysuria, frequency, hesitancy or incontinence. C/o intermittent  joint pain, swelling and limitation in mobility.especially knee and back Denies headaches, seizures, numbness, or tingling. Denies depression, anxiety or insomnia. Denies skin break down or rash.   PE  BP 128/80   Pulse 92   Resp 16   Ht 5\' 7"  (1.702 m)   Wt 162 lb (73.5 kg)   SpO2 95%   BMI 25.37 kg/m   Patient alert and oriented and in no cardiopulmonary distress.  HEENT: facial asymetry with swelling of right lower jaw, EOMI,   oropharynx pink and moist.  Neck supple no JVD, tender right submandibular node Right upper molar infected and fum swollen, tooth on left jaw shaky Chest: Clear to auscultation bilaterally.  CVS: S1, S2 no murmurs, no S3.Regular rate.  ABD: Soft non tender.   Ext: No edema  MS: Adequate though reduced ROM spine, shoulders, hips and knees.  Skin: Intact, no ulcerations or rash noted.  Psych: Good eye contact, normal affect. Memory intact not anxious or depressed appearing.  CNS: CN 2-12 intact, power,  normal throughout.no focal deficits noted.   Assessment & Plan  Essential hypertension Controlled, no change in medication DASH diet and  commitment to daily physical activity for a minimum of 30 minutes discussed and encouraged, as a part of hypertension management. The importance of attaining a healthy weight is also discussed.  BP/Weight 01/13/2018 08/26/2017 08/15/2017 08/12/2017 08/07/2017 07/15/2017 6/62/9476  Systolic BP 546 503 546 568 127 517 001  Diastolic BP 80 88 79 86 749 78 82  Wt. (Lbs) 162 160 - 163 164 160.25 162  BMI 25.37 25.06 - 25.53 25.69 25.1 25.37       Hypothyroidism Adequate control, no med change, though TSH slightly elevated  Hyperlipidemia LDL goal <100 Hyperlipidemia:Low fat diet discussed and encouraged.   Lipid Panel  Lab Results  Component Value Date   CHOL 155 01/09/2018   HDL 61 01/09/2018   LDLCALC 80 01/09/2018   TRIG 67 01/09/2018   CHOLHDL 2.5 01/09/2018   Controlled, no change in medication    Mass in neck Right submaxillary lymphadenopathy, mildly tender  Dental caries Cleocin prescribed and pt advised to seek immediate dental care

## 2018-01-13 NOTE — Assessment & Plan Note (Signed)
Controlled, no change in medication DASH diet and commitment to daily physical activity for a minimum of 30 minutes discussed and encouraged, as a part of hypertension management. The importance of attaining a healthy weight is also discussed.  BP/Weight 01/13/2018 08/26/2017 08/15/2017 08/12/2017 08/07/2017 07/15/2017 0/73/7106  Systolic BP 269 485 462 703 500 938 182  Diastolic BP 80 88 79 86 993 78 82  Wt. (Lbs) 162 160 - 163 164 160.25 162  BMI 25.37 25.06 - 25.53 25.69 25.1 25.37

## 2018-02-05 ENCOUNTER — Other Ambulatory Visit: Payer: Self-pay | Admitting: Family Medicine

## 2018-02-14 ENCOUNTER — Other Ambulatory Visit: Payer: Self-pay | Admitting: Family Medicine

## 2018-02-16 ENCOUNTER — Ambulatory Visit (INDEPENDENT_AMBULATORY_CARE_PROVIDER_SITE_OTHER): Payer: PPO

## 2018-02-16 VITALS — BP 118/88 | HR 95 | Temp 98.4°F | Resp 16 | Ht 67.0 in | Wt 160.0 lb

## 2018-02-16 DIAGNOSIS — Z0000 Encounter for general adult medical examination without abnormal findings: Secondary | ICD-10-CM

## 2018-02-16 NOTE — Patient Instructions (Signed)
[name] , Thank you for taking time to come for your Medicare Wellness Visit. I appreciate your ongoing commitment to your health goals. Please review the following plan we discussed and let me know if I can assist you in the future.   Screening recommendations/referrals: Colonoscopy: Due 2020 Recommended yearly ophthalmology/optometry visit for glaucoma screening and checkup Recommended yearly dental visit for hygiene and checkup  Vaccinations: Influenza vaccine:Due Fall 2019 Pneumococcal vaccine: UTD Tdap vaccine: UTD (Due 2020) Shingles vaccine: Call your insurance company to see if this is a covered vaccine.    Advanced directives: This was discussed during today's visit. You have been given a packet to complete and have notarized. Once that is done, please bring a copy to our office so that we can make a copy for your chart.   Conditions/risks identified: Discussed during your visit.  Next appointment: May 18, 2018 at 11:00 am with Dr.Simpson  Preventive Care 79 Years and Older, Male Preventive care refers to lifestyle choices and visits with your health care provider that can promote health and wellness. What does preventive care include?  A yearly physical exam. This is also called an annual well check.  Dental exams once or twice a year.  Routine eye exams. Ask your health care provider how often you should have your eyes checked.  Personal lifestyle choices, including:  Daily care of your teeth and gums.  Regular physical activity.  Eating a healthy diet.  Avoiding tobacco and drug use.  Limiting alcohol use.  Practicing safe sex.  Taking low doses of aspirin every day.  Taking vitamin and mineral supplements as recommended by your health care provider. What happens during an annual well check? The services and screenings done by your health care provider during your annual well check will depend on your age, overall health, lifestyle risk factors, and  family history of disease. Counseling  Your health care provider may ask you questions about your:  Alcohol use.  Tobacco use.  Drug use.  Emotional well-being.  Home and relationship well-being.  Sexual activity.  Eating habits.  History of falls.  Memory and ability to understand (cognition).  Work and work Statistician. Screening  You may have the following tests or measurements:  Height, weight, and BMI.  Blood pressure.  Lipid and cholesterol levels. These may be checked every 5 years, or more frequently if you are over 5 years old.  Skin check.  Lung cancer screening. You may have this screening every year starting at age 79 if you have a 30-pack-year history of smoking and currently smoke or have quit within the past 15 years.  Fecal occult blood test (FOBT) of the stool. You may have this test every year starting at age 79.  Flexible sigmoidoscopy or colonoscopy. You may have a sigmoidoscopy every 5 years or a colonoscopy every 10 years starting at age 79.  Prostate cancer screening. Recommendations will vary depending on your family history and other risks.  Hepatitis C blood test.  Hepatitis B blood test.  Sexually transmitted disease (STD) testing.  Diabetes screening. This is done by checking your blood sugar (glucose) after you have not eaten for a while (fasting). You may have this done every 1-3 years.  Abdominal aortic aneurysm (AAA) screening. You may need this if you are a current or former smoker.  Osteoporosis. You may be screened starting at age 79 if you are at high risk. Talk with your health care provider about your test results, treatment options, and  if necessary, the need for more tests. Vaccines  Your health care provider may recommend certain vaccines, such as:  Influenza vaccine. This is recommended every year.  Tetanus, diphtheria, and acellular pertussis (Tdap, Td) vaccine. You may need a Td booster every 10 years.  Zoster  vaccine. You may need this after age 60.  Pneumococcal 13-valent conjugate (PCV13) vaccine. One dose is recommended after age 9.  Pneumococcal polysaccharide (PPSV23) vaccine. One dose is recommended after age 73. Talk to your health care provider about which screenings and vaccines you need and how often you need them. This information is not intended to replace advice given to you by your health care provider. Make sure you discuss any questions you have with your health care provider. Document Released: 10/27/2015 Document Revised: 06/19/2016 Document Reviewed: 08/01/2015 Elsevier Interactive Patient Education  2017 [place] Prevention in the Home Falls can cause injuries. They can happen to people of all ages. There are many things you can do to make your home safe and to help prevent falls. What can I do on the outside of my home?  Regularly fix the edges of walkways and driveways and fix any cracks.  Remove anything that might make you trip as you walk through a door, such as a raised step or threshold.  Trim any bushes or trees on the path to your home.  Use bright outdoor lighting.  Clear any walking paths of anything that might make someone trip, such as rocks or tools.  Regularly check to see if handrails are loose or broken. Make sure that both sides of any steps have handrails.  Any raised decks and porches should have guardrails on the edges.  Have any leaves, snow, or ice cleared regularly.  Use sand or salt on walking paths during winter.  Clean up any spills in your garage right away. This includes oil or grease spills. What can I do in the bathroom?  Use night lights.  Install grab bars by the toilet and in the tub and shower. Do not use towel bars as grab bars.  Use non-skid mats or decals in the tub or shower.  If you need to sit down in the shower, use a plastic, non-slip stool.  Keep the floor dry. Clean up any water that spills on the  floor as soon as it happens.  Remove soap buildup in the tub or shower regularly.  Attach bath mats securely with double-sided non-slip rug tape.  Do not have throw rugs and other things on the floor that can make you trip. What can I do in the bedroom?  Use night lights.  Make sure that you have a light by your bed that is easy to reach.  Do not use any sheets or blankets that are too big for your bed. They should not hang down onto the floor.  Have a firm chair that has side arms. You can use this for support while you get dressed.  Do not have throw rugs and other things on the floor that can make you trip. What can I do in the kitchen?  Clean up any spills right away.  Avoid walking on wet floors.  Keep items that you use a lot in easy-to-reach places.  If you need to reach something above you, use a strong step stool that has a grab bar.  Keep electrical cords out of the way.  Do not use floor polish or wax that makes floors slippery. If you  must use wax, use non-skid floor wax.  Do not have throw rugs and other things on the floor that can make you trip. What can I do with my stairs?  Do not leave any items on the stairs.  Make sure that there are handrails on both sides of the stairs and use them. Fix handrails that are broken or loose. Make sure that handrails are as long as the stairways.  Check any carpeting to make sure that it is firmly attached to the stairs. Fix any carpet that is loose or worn.  Avoid having throw rugs at the top or bottom of the stairs. If you do have throw rugs, attach them to the floor with carpet tape.  Make sure that you have a light switch at the top of the stairs and the bottom of the stairs. If you do not have them, ask someone to add them for you. What else can I do to help prevent falls?  Wear shoes that:  Do not have high heels.  Have rubber bottoms.  Are comfortable and fit you well.  Are closed at the toe. Do not wear  sandals.  If you use a stepladder:  Make sure that it is fully opened. Do not climb a closed stepladder.  Make sure that both sides of the stepladder are locked into place.  Ask someone to hold it for you, if possible.  Clearly mark and make sure that you can see:  Any grab bars or handrails.  First and last steps.  Where the edge of each step is.  Use tools that help you move around (mobility aids) if they are needed. These include:  Canes.  Walkers.  Scooters.  Crutches.  Turn on the lights when you go into a dark area. Replace any light bulbs as soon as they burn out.  Set up your furniture so you have a clear path. Avoid moving your furniture around.  If any of your floors are uneven, fix them.  If there are any pets around you, be aware of where they are.  Review your medicines with your doctor. Some medicines can make you feel dizzy. This can increase your chance of falling. Ask your doctor what other things that you can do to help prevent falls. This information is not intended to replace advice given to you by your health care provider. Make sure you discuss any questions you have with your health care provider. Document Released: 07/27/2009 Document Revised: 03/07/2016 Document Reviewed: 11/04/2014 Elsevier Interactive Patient Education  2017 [employer].

## 2018-02-16 NOTE — Progress Notes (Signed)
Subjective:   [name] is a 77 y.o. male who presents for Medicare Annual/Subsequent preventive examination.  Review of Systems:   Cardiac Risk Factors include: male gender;advanced age (>77men, >64 women)     Objective:    Vitals: BP 118/88 (BP Location: Left Arm, Patient Position: Sitting, Cuff Size: Large)   Pulse 95   Temp 98.4 F (36.9 C) (Oral)   Resp 16   Ht 5\' 7"  (1.702 m)   Wt 160 lb (72.6 kg)   SpO2 97%   BMI 25.06 kg/m   Body mass index is 25.06 kg/m.  Advanced Directives 02/16/2018 08/12/2017 01/09/2017 11/08/2015 09/03/2013 07/16/2011  Does Patient Have a Medical Advance Directive? Yes No No No Patient does not have advance directive;Patient would not like information Patient would like information  Does patient want to make changes to medical advance directive? Yes (MAU/Ambulatory/Procedural Areas - Information given) - - - - -  Would patient like information on creating a medical advance directive? - No - Patient declined Yes (MAU/Ambulatory/Procedural Areas - Information given) Yes - Educational materials given - Advance directive packet given  Pre-existing out of facility DNR order (yellow form or pink MOST form) - - - - No -    Tobacco Social History   Tobacco Use  Smoking Status Never Smoker  Smokeless Tobacco Never Used     Counseling given: Yes   Clinical Intake:  Pre-visit preparation completed: Yes  Pain : No/denies pain     BMI - recorded: 25.1 Nutritional Status: BMI 25 -29 Overweight Diabetes: No  How often do you need to have someone help you when you read instructions, pamphlets, or other written materials from your doctor or pharmacy?: 1 - Never What is the last grade level you completed in school?: 12th  Interpreter Needed?: No  Information entered by :: Vilinda Blanks  Past Medical History:  Diagnosis Date  . Allergic rhinitis, seasonal   . BPH (benign prostatic hypertrophy)   . Erectile dysfunction   . History of  kidney stones   . Hx of thyroidectomy   . Hyperlipidemia   . Hypertension   . Hypothyroidism   . Pre-diabetes   . Prostate cancer ([place]) 2011   treated with radiation  . Urosepsis 07/2011   ICU admission [facility]   Past Surgical History:  Procedure Laterality Date  . COLONOSCOPY N/A 09/03/2013   Procedure: COLONOSCOPY;  Surgeon: Rogene Houston, MD;  Location: [facility];  Service: Endoscopy;  Laterality: N/A;  1225  . COLONOSCOPY N/A 11/08/2015   Procedure: COLONOSCOPY;  Surgeon: Rogene Houston, MD;  Location: [facility];  Service: Endoscopy;  Laterality: N/A;  1200  . MASS EXCISION N/A 08/15/2017   Procedure: EXCISION SEBACEOUS CYST NECK;  Surgeon: Aviva Signs, MD;  Location: [facility] ORS;  Service: General;  Laterality: N/A;  . PROSTATE SURGERY    . THYROIDECTOMY  2003  . transurethral resection of thr prostate  2003  . ureteral stone extraction  08/2011   Family History  Problem Relation Age of Onset  . Stroke Mother   . Diabetes Father   . Hypertension Father   . Kidney disease Brother   . Stroke Brother   . Diabetes Brother   . Gout Brother   . Diabetes Brother    Social History   Socioeconomic History  . Marital status: Married    Spouse name: Not on file  . Number of children: 5  . Years of education: Not on file  .  Highest education level: Not on file  Occupational History  . Occupation: retired  Scientific laboratory technician  . Financial resource strain: Not very hard  . Food insecurity:    Worry: Never true    Inability: Never true  . Transportation needs:    Medical: No    Non-medical: No  Tobacco Use  . Smoking status: Never Smoker  . Smokeless tobacco: Never Used  Substance and Sexual Activity  . Alcohol use: No    Alcohol/week: 0.0 oz  . Drug use: No  . Sexual activity: Yes  Lifestyle  . Physical activity:    Days per week: 0 days    Minutes per session: 0 min  . Stress: Not at all  Relationships  . Social connections:    Talks on phone: More than three  times a week    Gets together: More than three times a week    Attends religious service: More than 4 times per year    Active member of club or organization: Yes    Attends meetings of clubs or organizations: More than 4 times per year    Relationship status: Married  Other Topics Concern  . Not on file  Social History Narrative  . Not on file    Outpatient Encounter Medications as of 02/16/2018  Medication Sig  . amLODipine (NORVASC) 10 MG tablet Take 1 tablet (10 mg total) by mouth daily.  Marland Kitchen aspirin EC 81 MG tablet Take 1 tablet (81 mg total) by mouth daily.  . cetirizine (ZYRTEC ALLERGY) 10 MG tablet Take 10 mg by mouth daily.   . hydrochlorothiazide (MICROZIDE) 12.5 MG capsule Take 1 capsule (12.5 mg total) by mouth daily.  Marland Kitchen levothyroxine (SYNTHROID, LEVOTHROID) 75 MCG tablet Take 1 tablet (75 mcg total) by mouth daily.  Marland Kitchen lovastatin (MEVACOR) 40 MG tablet Take 1 tablet (40 mg total) by mouth daily.  . Multiple Vitamin (MULTIVITAMIN WITH MINERALS) TABS tablet Take 1 tablet by mouth daily.  Marland Kitchen oxybutynin (DITROPAN) 5 MG tablet TAKE 1 TABLET BY MOUTH ONCE DAILY.  Marland Kitchen potassium chloride SA (K-DUR,KLOR-CON) 20 MEQ tablet TAKE 1 TABLET BY MOUTH ONCE A DAY.  . sildenafil (REVATIO) 20 MG tablet Take 3 tablets (60 mg total) by mouth daily.  . clindamycin (CLEOCIN) 300 MG capsule Take 1 capsule (300 mg total) by mouth 3 (three) times daily. (Patient not taking: Reported on 02/16/2018)  . oxybutynin (DITROPAN) 5 MG tablet TAKE 1 TABLET BY MOUTH ONCE A DAY. (Patient not taking: Reported on 02/16/2018)  . [DISCONTINUED] oxybutynin (DITROPAN) 5 MG tablet TAKE 1 TABLET BY MOUTH EVERY DAY FOR URINARY INCONTINENCE   No facility-administered encounter medications on file as of 02/16/2018.     Activities of Daily Living In your present state of health, do you have any difficulty performing the following activities: 02/16/2018 08/12/2017  Hearing? N N  Vision? N N  Difficulty concentrating or making  decisions? N N  Walking or climbing stairs? N N  Dressing or bathing? N N  Doing errands, shopping? N N  Preparing Food and eating ? N -  Using the Toilet? N -  In the past six months, have you accidently leaked urine? N -  Do you have problems with loss of bowel control? N -  Managing your Medications? N -  Managing your Finances? N -  Housekeeping or managing your Housekeeping? N -  Some recent data might be hidden    Patient Care Team: Fayrene Helper, MD as PCP - General  Enzo Montgomery, MD as Attending Physician (Urology)   Assessment:   This is a routine wellness examination for [name].  Exercise Activities and Dietary recommendations Current Exercise Habits: The patient does not participate in regular exercise at present(Patient does house work and outside house keeping. ), Exercise limited by: None identified  Goals    . Exercise 3x per week (30 min per time)     Recommend starting a routine exercise program at least 3 days a week for 30-45 minutes at a time as tolerated.         Fall Risk Fall Risk  02/16/2018 01/13/2018 01/09/2017 01/08/2016 09/20/2015  Falls in the past year? No No No No No   Is the patient's home free of loose throw rugs in walkways, pet beds, electrical cords, etc?   no      Grab bars in the bathroom? no      Handrails on the stairs?   no single level home      Adequate lighting?   yes  Depression Screen PHQ 2/9 Scores 02/16/2018 01/13/2018 01/09/2017 01/05/2015  PHQ - 2 Score 0 0 0 0  PHQ- 9 Score - 1 - 0    Cognitive Function     6CIT Screen 02/16/2018 01/09/2017  What Year? 0 points 0 points  What month? 0 points 0 points  What time? 0 points 0 points  Count back from 20 0 points 0 points  Months in reverse 0 points 0 points  Repeat phrase 0 points 0 points  Total Score 0 0    Immunization History  Administered Date(s) Administered  . Influenza Whole 09/28/2004  . Pneumococcal Conjugate-13 09/05/2014  . Pneumococcal  Polysaccharide-23 03/19/2004, 07/11/2009  . Td 03/19/2004    Qualifies for Shingles Vaccine? Patient will call insurance company to see if this is a covered vaccine.  Screening Tests Health Maintenance  Topic Date Due  . INFLUENZA VACCINE  09/04/2018 (Originally 05/14/2018)  . TETANUS/TDAP  01/14/2019 (Originally 03/19/2014)  . HEMOGLOBIN A1C  07/12/2018  . COLONOSCOPY  11/07/2018  . PNA vac Low Risk Adult  Completed   Cancer Screenings: Lung: Low Dose CT Chest recommended if Age 98-80 years, 30 pack-year currently smoking OR have quit w/in 15years. Patient does not qualify. Colorectal: Colonoscopy due 2020     Plan:   I have personally reviewed and noted the following in the patient's chart:   . Medical and social history . Use of alcohol, tobacco or illicit drugs  . Current medications and supplements . Functional ability and status . Nutritional status . Physical activity . Advanced directives . List of other physicians . Hospitalizations, surgeries, and ER visits in previous 12 months . Vitals . Screenings to include cognitive, depression, and falls . Referrals and appointments  In addition, I have reviewed and discussed with patient certain preventive protocols, quality metrics, and best practice recommendations. A written personalized care plan for preventive services as well as general preventive health recommendations were provided to patient.     Tod Persia, [place]  02/16/2018

## 2018-02-18 ENCOUNTER — Ambulatory Visit: Payer: PPO

## 2018-04-29 ENCOUNTER — Other Ambulatory Visit: Payer: Self-pay | Admitting: Family Medicine

## 2018-04-30 ENCOUNTER — Other Ambulatory Visit: Payer: Self-pay | Admitting: Family Medicine

## 2018-05-18 ENCOUNTER — Ambulatory Visit (INDEPENDENT_AMBULATORY_CARE_PROVIDER_SITE_OTHER): Payer: PPO | Admitting: Family Medicine

## 2018-05-18 ENCOUNTER — Encounter: Payer: Self-pay | Admitting: Family Medicine

## 2018-05-18 VITALS — BP 120/80 | HR 80 | Resp 16 | Ht 67.0 in | Wt 157.1 lb

## 2018-05-18 DIAGNOSIS — E039 Hypothyroidism, unspecified: Secondary | ICD-10-CM

## 2018-05-18 DIAGNOSIS — R7303 Prediabetes: Secondary | ICD-10-CM | POA: Diagnosis not present

## 2018-05-18 DIAGNOSIS — E559 Vitamin D deficiency, unspecified: Secondary | ICD-10-CM

## 2018-05-18 DIAGNOSIS — E785 Hyperlipidemia, unspecified: Secondary | ICD-10-CM

## 2018-05-18 DIAGNOSIS — F528 Other sexual dysfunction not due to a substance or known physiological condition: Secondary | ICD-10-CM | POA: Diagnosis not present

## 2018-05-18 DIAGNOSIS — I10 Essential (primary) hypertension: Secondary | ICD-10-CM

## 2018-05-18 MED ORDER — SILDENAFIL CITRATE 20 MG PO TABS: 60.0000 mg | ORAL_TABLET | Freq: Every day | ORAL | 1 refills | Status: DC

## 2018-05-18 NOTE — Progress Notes (Signed)
[name]     MRN: [id number]      DOB: Mar 14, 1941   HPI [name] is here for follow up and re-evaluation of chronic medical conditions, medication management and review of any available recent lab and radiology data.  Preventive health is updated, specifically  Cancer screening and Immunization.   The PT denies any adverse reactions to current medications since the last visit.  C/o intermittent left hip pain up to a 6 esp after heavy lifting  , most recent was approx 4 weeks ago  Duration 1 day  ROS Denies recent fever or chills. Denies sinus pressure, nasal congestion, ear pain or sore throat. Denies chest congestion, productive cough or wheezing. Denies chest pains, palpitations and leg swelling Denies abdominal pain, nausea, vomiting,diarrhea or constipation.   Denies dysuria, frequency, hesitancy or incontinence. C/o intermittent left hip pain Denies headaches, seizures, numbness, or tingling. Denies depression, anxiety or insomnia. Denies skin break down or rash.   PE  BP 120/80   Pulse 80   Resp 16   Ht 5\' 7"  (1.702 m)   Wt 157 lb 1.9 oz (71.3 kg)   BMI 24.61 kg/m   Patient alert and oriented and in no cardiopulmonary distress.  HEENT: No facial asymmetry, EOMI,   oropharynx pink and moist.  Neck supple no JVD, no mass.  Chest: Clear to auscultation bilaterally.  CVS: S1, S2 no murmurs, no S3.Regular rate.  ABD: Soft non tender.   Ext: No edema  MS: Adequate though reduced  ROM spine, adequate in  shoulders, hips and knees.  Skin: Intact, no ulcerations or rash noted.  Psych: Good eye contact, normal affect. Memory intact not anxious or depressed appearing.  CNS: CN 2-12 intact, power,  normal throughout.no focal deficits noted.   Assessment & Plan  Essential hypertension Controlled, no change in medication DASH diet and commitment to daily physical activity for a minimum of 30 minutes discussed and encouraged, as a part of hypertension  management. The importance of attaining a healthy weight is also discussed.  BP/Weight 05/18/2018 02/16/2018 01/13/2018 08/26/2017 08/15/2017 08/12/2017 87/56/4332  Systolic BP 951 884 166 063 016 010 932  Diastolic BP 80 88 80 88 79 86 102  Wt. (Lbs) 157.12 160 162 160 - 163 164  BMI 24.61 25.06 25.37 25.06 - 25.53 25.69       Hypothyroidism Controlled, no change in medication   Prediabetes Patient educated about the importance of limiting  Carbohydrate intake , the need to commit to daily physical activity for a minimum of 30 minutes , and to commit weight loss. The fact that changes in all these areas will reduce or eliminate all together the development of diabetes is stressed.  Updated lab needed at/ before next visit.   Diabetic Labs Latest Ref Rng & Units 01/09/2018 08/12/2017 07/11/2017 01/06/2017 07/03/2016  HbA1c <5.7 % of total Hgb 6.1(H) - 6.0(H) 6.0(H) 5.9(H)  Microalbumin 0.00 - 1.89 mg/dL - - - - -  Micro/Creat Ratio 0.0 - 30.0 mg/g - - - - -  Chol <200 mg/dL 155 - - 149 146  HDL >40 mg/dL 61 - - 59 65  Calc LDL mg/dL (calc) 80 - - 75 67  Triglycerides <150 mg/dL 67 - - 74 70  Creatinine 0.70 - 1.18 mg/dL 1.29(H) 1.19 1.17 1.31(H) 1.36(H)   BP/Weight 05/18/2018 02/16/2018 01/13/2018 08/26/2017 08/15/2017 08/12/2017 35/57/3220  Systolic BP 254 270 623 762 831 517 616  Diastolic BP 80 88 80 88 79 86  102  Wt. (Lbs) 157.12 160 162 160 - 163 164  BMI 24.61 25.06 25.37 25.06 - 25.53 25.69   Foot/eye exam completion dates 03/21/2010  Foot exam Order yes  Foot Form Completion -      ERECTILE DYSFUNCTION Unchanged, needs medication for sexual function

## 2018-05-18 NOTE — Patient Instructions (Addendum)
Annual physical exam  Early December  Call if you need me before  Please start eating breakfast with your wife every morning, you have to gain 3 to 5 pounds to gain   You have bursitis in the left hip, do not lift heavy objects  Fasting lipid, cmp and EGFR, HBA1C and TSH and Vit D 1 week before next visit  No medication changes

## 2018-06-01 ENCOUNTER — Encounter: Payer: Self-pay | Admitting: Family Medicine

## 2018-06-01 NOTE — Assessment & Plan Note (Signed)
Controlled, no change in medication  

## 2018-06-01 NOTE — Assessment & Plan Note (Signed)
Unchanged, needs medication for sexual function

## 2018-06-01 NOTE — Assessment & Plan Note (Signed)
Controlled, no change in medication DASH diet and commitment to daily physical activity for a minimum of 30 minutes discussed and encouraged, as a part of hypertension management. The importance of attaining a healthy weight is also discussed.  BP/Weight 05/18/2018 02/16/2018 01/13/2018 08/26/2017 08/15/2017 08/12/2017 04/12/1600  Systolic BP 093 235 573 220 254 270 623  Diastolic BP 80 88 80 88 79 86 102  Wt. (Lbs) 157.12 160 162 160 - 163 164  BMI 24.61 25.06 25.37 25.06 - 25.53 25.69

## 2018-06-01 NOTE — Assessment & Plan Note (Signed)
Patient educated about the importance of limiting  Carbohydrate intake , the need to commit to daily physical activity for a minimum of 30 minutes , and to commit weight loss. The fact that changes in all these areas will reduce or eliminate all together the development of diabetes is stressed.  Updated lab needed at/ before next visit.   Diabetic Labs Latest Ref Rng & Units 01/09/2018 08/12/2017 07/11/2017 01/06/2017 07/03/2016  HbA1c <5.7 % of total Hgb 6.1(H) - 6.0(H) 6.0(H) 5.9(H)  Microalbumin 0.00 - 1.89 mg/dL - - - - -  Micro/Creat Ratio 0.0 - 30.0 mg/g - - - - -  Chol <200 mg/dL 155 - - 149 146  HDL >40 mg/dL 61 - - 59 65  Calc LDL mg/dL (calc) 80 - - 75 67  Triglycerides <150 mg/dL 67 - - 74 70  Creatinine 0.70 - 1.18 mg/dL 1.29(H) 1.19 1.17 1.31(H) 1.36(H)   BP/Weight 05/18/2018 02/16/2018 01/13/2018 08/26/2017 08/15/2017 08/12/2017 15/17/6160  Systolic BP 737 106 269 485 462 703 500  Diastolic BP 80 88 80 88 79 86 102  Wt. (Lbs) 157.12 160 162 160 - 163 164  BMI 24.61 25.06 25.37 25.06 - 25.53 25.69   Foot/eye exam completion dates 03/21/2010  Foot exam Order yes  Foot Form Completion -

## 2018-06-22 ENCOUNTER — Other Ambulatory Visit: Payer: Self-pay | Admitting: Family Medicine

## 2018-08-07 ENCOUNTER — Other Ambulatory Visit: Payer: Self-pay | Admitting: Family Medicine

## 2018-09-16 ENCOUNTER — Other Ambulatory Visit: Payer: Self-pay

## 2018-09-16 DIAGNOSIS — N39498 Other specified urinary incontinence: Secondary | ICD-10-CM

## 2018-09-16 MED ORDER — OXYBUTYNIN CHLORIDE 5 MG PO TABS: 5.0000 mg | ORAL_TABLET | Freq: Every day | ORAL | 2 refills | Status: DC

## 2018-09-16 NOTE — Progress Notes (Signed)
Ditropan reordered to [employer]

## 2018-09-24 DIAGNOSIS — I10 Essential (primary) hypertension: Secondary | ICD-10-CM | POA: Diagnosis not present

## 2018-09-24 DIAGNOSIS — R7303 Prediabetes: Secondary | ICD-10-CM | POA: Diagnosis not present

## 2018-09-24 DIAGNOSIS — E559 Vitamin D deficiency, unspecified: Secondary | ICD-10-CM | POA: Diagnosis not present

## 2018-09-24 DIAGNOSIS — E785 Hyperlipidemia, unspecified: Secondary | ICD-10-CM | POA: Diagnosis not present

## 2018-09-25 ENCOUNTER — Other Ambulatory Visit: Payer: Self-pay | Admitting: Family Medicine

## 2018-09-25 LAB — COMPLETE METABOLIC PANEL WITH GFR
AG RATIO: 1.4 (calc) (ref 1.0–2.5)
ALKALINE PHOSPHATASE (APISO): 58 U/L (ref 40–115)
ALT: 11 U/L (ref 9–46)
AST: 17 U/L (ref 10–35)
Albumin: 4.4 g/dL (ref 3.6–5.1)
BILIRUBIN TOTAL: 1 mg/dL (ref 0.2–1.2)
BUN/Creatinine Ratio: 13 (calc) (ref 6–22)
BUN: 16 mg/dL (ref 7–25)
CALCIUM: 9.7 mg/dL (ref 8.6–10.3)
CO2: 32 mmol/L (ref 20–32)
Chloride: 102 mmol/L (ref 98–110)
Creat: 1.19 mg/dL — ABNORMAL HIGH (ref 0.70–1.18)
GFR, Est African American: 67 mL/min/{1.73_m2} (ref >=60)
GFR, Est Non African American: 58 mL/min/{1.73_m2} — ABNORMAL LOW (ref >=60)
Globulin: 3.1 g/dL (calc) (ref 1.9–3.7)
Glucose, Bld: 102 mg/dL — ABNORMAL HIGH (ref 65–99)
Potassium: 4.7 mmol/L (ref 3.5–5.3)
Sodium: 141 mmol/L (ref 135–146)
Total Protein: 7.5 g/dL (ref 6.1–8.1)

## 2018-09-25 LAB — VITAMIN D 25 HYDROXY (VIT D DEFICIENCY, FRACTURES): Vit D, 25-Hydroxy: 18 ng/mL — ABNORMAL LOW (ref 30–100)

## 2018-09-25 LAB — HEMOGLOBIN A1C
Hgb A1c MFr Bld: 6.2 % of total Hgb — ABNORMAL HIGH (ref <5.7)
MEAN PLASMA GLUCOSE: 131 (calc)
eAG (mmol/L): 7.3 (calc)

## 2018-09-25 LAB — LIPID PANEL
CHOLESTEROL: 157 mg/dL (ref <200)
HDL: 68 mg/dL (ref >40)
LDL CHOLESTEROL (CALC): 75 mg/dL
NON-HDL CHOLESTEROL (CALC): 89 mg/dL (ref <130)
TRIGLYCERIDES: 62 mg/dL (ref <150)
Total CHOL/HDL Ratio: 2.3 (calc) (ref <5.0)

## 2018-09-25 LAB — TSH: TSH: 2.42 m[IU]/L (ref 0.40–4.50)

## 2018-09-25 MED ORDER — ERGOCALCIFEROL 1.25 MG (50000 UT) PO CAPS: 50000.0000 [IU] | ORAL_CAPSULE | ORAL | 1 refills | Status: DC

## 2018-09-29 ENCOUNTER — Encounter: Payer: Self-pay | Admitting: Family Medicine

## 2018-09-29 ENCOUNTER — Ambulatory Visit (INDEPENDENT_AMBULATORY_CARE_PROVIDER_SITE_OTHER): Payer: PPO | Admitting: Family Medicine

## 2018-09-29 VITALS — BP 132/78 | HR 100 | Resp 10 | Ht 67.0 in | Wt 160.0 lb

## 2018-09-29 DIAGNOSIS — Z0000 Encounter for general adult medical examination without abnormal findings: Secondary | ICD-10-CM

## 2018-09-29 DIAGNOSIS — E039 Hypothyroidism, unspecified: Secondary | ICD-10-CM

## 2018-09-29 DIAGNOSIS — I10 Essential (primary) hypertension: Secondary | ICD-10-CM

## 2018-09-29 DIAGNOSIS — R7303 Prediabetes: Secondary | ICD-10-CM

## 2018-09-29 DIAGNOSIS — E785 Hyperlipidemia, unspecified: Secondary | ICD-10-CM

## 2018-09-29 MED ORDER — AMLODIPINE BESYLATE 10 MG PO TABS: 10.0000 mg | ORAL_TABLET | Freq: Every day | ORAL | 3 refills | Status: DC

## 2018-09-29 MED ORDER — SILDENAFIL CITRATE 20 MG PO TABS: ORAL_TABLET | ORAL | 3 refills | Status: DC

## 2018-09-29 MED ORDER — LEVOTHYROXINE SODIUM 75 MCG PO TABS: 75.0000 ug | ORAL_TABLET | Freq: Every day | ORAL | 3 refills | Status: DC

## 2018-09-29 MED ORDER — HYDROCHLOROTHIAZIDE 12.5 MG PO CAPS: 12.5000 mg | ORAL_CAPSULE | Freq: Every day | ORAL | 3 refills | Status: DC

## 2018-09-29 NOTE — Assessment & Plan Note (Signed)

## 2018-09-29 NOTE — Progress Notes (Signed)
   [name]     MRN: [id number]      DOB: August 21, 1941   HPI: Patient is in for annual physical exam. No other health concerns are expressed or addressed at the visit. Recent labs, if available are reviewed. Immunization is reviewed , and  updated if needed.    PE; BP 132/78 (BP Location: Right Arm, Patient Position: Sitting, Cuff Size: Normal)   Pulse 100   Resp 10   Ht 5\' 7"  (1.702 m)   Wt 160 lb (72.6 kg)   SpO2 97% Comment: room air  BMI 25.06 kg/m   Pleasant male, alert and oriented x 3, in no cardio-pulmonary distress. Afebrile. HEENT No facial trauma or asymetry. Sinuses non tender. EOMI External ears normal, tympanic membranes clear. Oropharynx moist, no exudate. Neck: supple, no adenopathy,JVD or thyromegaly.No bruits.  Chest: Clear to ascultation bilaterally.No crackles or wheezes. Non tender to palpation    Cardiovascular system; Heart sounds normal,  S1 and  S2 ,no S3.  No murmur, or thrill. Apical beat not displaced Peripheral pulses normal.  Abdomen: Soft, non tender, no organomegaly or masses. No bruits. Bowel sounds normal. No guarding, tenderness or rebound.      Musculoskeletal exam: Full ROM of spine, hips , shoulders and knees. No deformity ,swelling or crepitus noted. No muscle wasting or atrophy.   Neurologic: Cranial nerves 2 to 12 intact. Power, tone ,sensation and reflexes normal throughout. No disturbance in gait. No tremor.  Skin: Intact, no ulceration, erythema , scaling or rash noted. Pigmentation normal throughout  Psych; Normal mood and affect. Judgement and concentration normal   Assessment & Plan:  Annual physical exam Annual exam as documented. Counseling done  re healthy lifestyle involving commitment to 150 minutes exercise per week, heart healthy diet, and attaining healthy weight.The importance of adequate sleep also discussed. Regular seat belt use and home safety, is also discussed. Changes in  health habits are decided on by the patient with goals and time frames  set for achieving them. Immunization and cancer screening needs are specifically addressed at this visit.

## 2018-09-29 NOTE — Patient Instructions (Addendum)
Wellness with nurse  needs to be scheduled in May, please sched at end of May  Fasting lipid, cmp and EGFr, HBA`1C and tSH in May 1 week before nurse visit  Annual exam with MD 09/30/2019, call if you need me before  No changes inm medicatin  Start OTC vit D3  200 IU once daily or 4 days per week  You will get script in hand to take to health dept as discussed  Keep active and maintain healthy weight , NO  HEAVY LIFTING   Best wishes for 2020 to yu and your family

## 2018-11-04 ENCOUNTER — Other Ambulatory Visit: Payer: Self-pay | Admitting: Family Medicine

## 2018-11-12 ENCOUNTER — Encounter: Payer: Self-pay | Admitting: *Deleted

## 2018-12-31 ENCOUNTER — Telehealth: Payer: Self-pay

## 2018-12-31 NOTE — Telephone Encounter (Signed)
Debra from the health dept said they cannot get viagra but they can get cialis. Can he be switched and a 90 day rx be sent to the health dept assistance program at [phone]

## 2019-01-13 NOTE — Telephone Encounter (Signed)
Prescription printed for cialis 10 mg tablet one daily, this CANNOT be prescribed, I am sending it back to the HD with the explanation, only cialis 5 mg one daily can be prescribed FYI

## 2019-01-27 ENCOUNTER — Encounter: Payer: Self-pay | Admitting: *Deleted

## 2019-01-27 ENCOUNTER — Other Ambulatory Visit: Payer: Self-pay | Admitting: Family Medicine

## 2019-02-04 ENCOUNTER — Other Ambulatory Visit: Payer: Self-pay | Admitting: Family Medicine

## 2019-02-08 ENCOUNTER — Ambulatory Visit: Payer: PPO | Admitting: Family Medicine

## 2019-02-08 ENCOUNTER — Other Ambulatory Visit: Payer: Self-pay

## 2019-02-10 ENCOUNTER — Ambulatory Visit (INDEPENDENT_AMBULATORY_CARE_PROVIDER_SITE_OTHER): Payer: PPO | Admitting: Family Medicine

## 2019-02-10 ENCOUNTER — Encounter: Payer: Self-pay | Admitting: Family Medicine

## 2019-02-10 ENCOUNTER — Other Ambulatory Visit: Payer: Self-pay

## 2019-02-10 VITALS — BP 130/78 | HR 100 | Resp 12 | Ht 67.0 in | Wt 162.0 lb

## 2019-02-10 DIAGNOSIS — E039 Hypothyroidism, unspecified: Secondary | ICD-10-CM

## 2019-02-10 DIAGNOSIS — R7301 Impaired fasting glucose: Secondary | ICD-10-CM | POA: Diagnosis not present

## 2019-02-10 DIAGNOSIS — Z125 Encounter for screening for malignant neoplasm of prostate: Secondary | ICD-10-CM

## 2019-02-10 DIAGNOSIS — R351 Nocturia: Secondary | ICD-10-CM

## 2019-02-10 DIAGNOSIS — I10 Essential (primary) hypertension: Secondary | ICD-10-CM | POA: Diagnosis not present

## 2019-02-10 DIAGNOSIS — R7303 Prediabetes: Secondary | ICD-10-CM

## 2019-02-10 DIAGNOSIS — E785 Hyperlipidemia, unspecified: Secondary | ICD-10-CM

## 2019-02-10 DIAGNOSIS — D126 Benign neoplasm of colon, unspecified: Secondary | ICD-10-CM | POA: Diagnosis not present

## 2019-02-10 NOTE — Progress Notes (Signed)
   [name]     MRN: [id number]      DOB: May 12, 1941   HPI [name] is here for follow up and re-evaluation of chronic medical conditions, medication management and review of any available recent lab and radiology data.  Wife was tearful and concerned that he is not eating and is losing weight Pt denies any change in appetite, stool caliber or pattern, no abdominal pain, states he feels well. States he has not been a breakfast  Person, eats a late snack and is not hungry before around 11 am most days and this is what bothers his wife. He  Denies any symptoms of depression C/o very poor urinary stream with nocturia, looking forward to getting the daily Cialis he has waited on for over 1 year The PT denies any adverse reactions to current medications since the last visit.     ROS Denies recent fever or chills. Denies sinus pressure, nasal congestion, ear pain or sore throat. Denies chest congestion, productive cough or wheezing. Denies chest pains, palpitations and leg swelling Denies abdominal pain, nausea, vomiting,diarrhea or constipation.   Denies dysuria, frequency, hesitancy or incontinence. Denies significant, though has mild oint pain, swelling and limitation in mobility. Denies headaches, seizures, numbness, or tingling. Denies depression, anxiety or insomnia. Denies skin break down or rash.   PE  BP 130/78   Pulse 100   Resp 12   Ht 5\' 7"  (1.702 m)   Wt 162 lb (73.5 kg)   SpO2 97%   BMI 25.37 kg/m   Patient alert and oriented and in no cardiopulmonary distress.  HEENT: No facial asymmetry, EOMI,   oropharynx pink and moist.  Neck supple no JVD, no mass.  Chest: Clear to auscultation bilaterally.  CVS: S1, S2 no murmurs, no S3.Regular rate.  ABD: Soft non tender.   Ext: No edema  and knees.  Skin: Intact, no ulcerations or rash noted.  Psych: Good eye contact, normal affect. Memory intact not anxious or depressed appearing.  CNS: CN 2-12  intact, power,  normal throughout.no focal deficits noted.   Assessment & Plan  Essential hypertension Controlled, no change in medication DASH diet and commitment to daily physical activity for a minimum of 30 minutes discussed and encouraged, as a part of hypertension management. The importance of attaining a healthy weight is also discussed.  BP/Weight 02/10/2019 09/29/2018 05/18/2018 02/16/2018 01/13/2018 08/26/2017 09/19/5169  Systolic BP 017 494 496 759 163 846 659  Diastolic BP 78 78 80 88 80 88 79  Wt. (Lbs) 162 160 157.12 160 162 160 -  BMI 25.37 25.06 24.61 25.06 25.37 25.06 -       Hypothyroidism Controlled when last checked Updated lab needed .   Hyperlipidemia LDL goal <100 Hyperlipidemia:Low fat diet discussed and encouraged.   Lipid Panel  Lab Results  Component Value Date   CHOL 160 02/10/2019   HDL 64 02/10/2019   LDLCALC 79 02/10/2019   TRIG 87 02/10/2019   CHOLHDL 2.5 02/10/2019   Controlled, no change in medication     Nocturia more than twice per night Reports nocturia with poor stream, will soon obtain daily cialis , which is beneficial  Tubular adenoma of colon Npo concerning symptoms currently, no rectal bleeding, or melena, no change in BM , and weight and appetite are stable

## 2019-02-10 NOTE — Assessment & Plan Note (Signed)
Reports nocturia with poor stream, will soon obtain daily cialis , which is beneficial

## 2019-02-10 NOTE — Assessment & Plan Note (Signed)
Controlled, no change in medication DASH diet and commitment to daily physical activity for a minimum of 30 minutes discussed and encouraged, as a part of hypertension management. The importance of attaining a healthy weight is also discussed.  BP/Weight 02/10/2019 09/29/2018 05/18/2018 02/16/2018 01/13/2018 08/26/2017 97/12/5327  Systolic BP 924 268 341 962 229 798 921  Diastolic BP 78 78 80 88 80 88 79  Wt. (Lbs) 162 160 157.12 160 162 160 -  BMI 25.37 25.06 24.61 25.06 25.37 25.06 -

## 2019-02-10 NOTE — Assessment & Plan Note (Signed)
Hyperlipidemia:Low fat diet discussed and encouraged.   Lipid Panel  Lab Results  Component Value Date   CHOL 160 02/10/2019   HDL 64 02/10/2019   LDLCALC 79 02/10/2019   TRIG 87 02/10/2019   CHOLHDL 2.5 02/10/2019   Controlled, no change in medication

## 2019-02-10 NOTE — Assessment & Plan Note (Signed)
Npo concerning symptoms currently, no rectal bleeding, or melena, no change in BM , and weight and appetite are stable

## 2019-02-10 NOTE — Patient Instructions (Addendum)
Keep wellness appt  Please schedule MD follow up in 5.5 months, call if you need me before  Labs today HBA1C, cmp and EGFr, lipid and PSA and TSH and cBC  Weight is the same as 01/2018, and is excellent  Please try to eat your bedtime snack as breakfast in the morning , so you can eat with Ms Grealish  Thankful you are getting your cialis for urine flow   Social distancing. Frequent hand washing with soap and water Keeping your hands off of your face.Cover your face when outside of your home These 3 practices will help to keep both you and your community healthy during this time. Please practice them faithfully!  Thanks for choosing [facility], we consider it a privelige to serve you.

## 2019-02-10 NOTE — Assessment & Plan Note (Signed)
Controlled when last checked Updated lab needed .

## 2019-02-11 LAB — LIPID PANEL
Cholesterol: 160 mg/dL (ref <200)
HDL: 64 mg/dL (ref >=40)
LDL Cholesterol (Calc): 79 mg/dL (calc)
Non-HDL Cholesterol (Calc): 96 mg/dL (calc) (ref <130)
Total CHOL/HDL Ratio: 2.5 (calc) (ref <5.0)
Triglycerides: 87 mg/dL (ref <150)

## 2019-02-11 LAB — COMPLETE METABOLIC PANEL WITH GFR
AG Ratio: 1.4 (calc) (ref 1.0–2.5)
ALT: 14 U/L (ref 9–46)
AST: 18 U/L (ref 10–35)
Albumin: 4.6 g/dL (ref 3.6–5.1)
Alkaline phosphatase (APISO): 57 U/L (ref 35–144)
BUN/Creatinine Ratio: 13 (calc) (ref 6–22)
BUN: 16 mg/dL (ref 7–25)
CO2: 30 mmol/L (ref 20–32)
Calcium: 9.6 mg/dL (ref 8.6–10.3)
Chloride: 102 mmol/L (ref 98–110)
Creat: 1.24 mg/dL — ABNORMAL HIGH (ref 0.70–1.11)
GFR, Est African American: 63 mL/min/{1.73_m2} (ref >=60)
GFR, Est Non African American: 55 mL/min/{1.73_m2} — ABNORMAL LOW (ref >=60)
Globulin: 3.2 g/dL (calc) (ref 1.9–3.7)
Glucose, Bld: 114 mg/dL — ABNORMAL HIGH (ref 65–99)
Potassium: 4.1 mmol/L (ref 3.5–5.3)
Sodium: 142 mmol/L (ref 135–146)
Total Bilirubin: 1.1 mg/dL (ref 0.2–1.2)
Total Protein: 7.8 g/dL (ref 6.1–8.1)

## 2019-02-11 LAB — HEMOGLOBIN A1C
Hgb A1c MFr Bld: 6.4 % of total Hgb — ABNORMAL HIGH (ref <5.7)
Mean Plasma Glucose: 137 (calc)
eAG (mmol/L): 7.6 (calc)

## 2019-02-11 LAB — CBC
HCT: 50.2 % — ABNORMAL HIGH (ref 38.5–50.0)
Hemoglobin: 16.2 g/dL (ref 13.2–17.1)
MCH: 27.1 pg (ref 27.0–33.0)
MCHC: 32.3 g/dL (ref 32.0–36.0)
MCV: 83.9 fL (ref 80.0–100.0)
MPV: 10.6 fL (ref 7.5–12.5)
Platelets: 234 10*3/uL (ref 140–400)
RBC: 5.98 10*6/uL — ABNORMAL HIGH (ref 4.20–5.80)
RDW: 13.1 % (ref 11.0–15.0)
WBC: 5.2 10*3/uL (ref 3.8–10.8)

## 2019-02-11 LAB — TSH: TSH: 2.21 mIU/L (ref 0.40–4.50)

## 2019-02-11 LAB — PSA: PSA: 0.1 ng/mL (ref <=4.0)

## 2019-02-18 ENCOUNTER — Encounter: Payer: Self-pay | Admitting: Family Medicine

## 2019-02-18 ENCOUNTER — Ambulatory Visit (INDEPENDENT_AMBULATORY_CARE_PROVIDER_SITE_OTHER): Payer: PPO | Admitting: Family Medicine

## 2019-02-18 VITALS — BP 130/78 | Ht 67.0 in | Wt 162.0 lb

## 2019-02-18 DIAGNOSIS — Z0000 Encounter for general adult medical examination without abnormal findings: Secondary | ICD-10-CM

## 2019-02-18 NOTE — Progress Notes (Signed)
Subjective:   [name] is a 78 y.o. male who presents for Medicare Annual/Subsequent preventive examination.  Location of Patient: Home Location of Provider: Telehealth Consent was obtain for visit to be over via telehealth.  I verified that I am speaking with the correct person using two identifiers.   Review of Systems:    Cardiac Risk Factors include: advanced age (>30men, >67 women);hypertension;male gender     Objective:    Vitals: BP 130/78   Ht 5\' 7"  (1.702 m)   Wt 162 lb (73.5 kg)   BMI 25.37 kg/m   Body mass index is 25.37 kg/m.  Advanced Directives 02/16/2018 08/12/2017 01/09/2017 11/08/2015 09/03/2013 07/16/2011  Does Patient Have a Medical Advance Directive? Yes No No No Patient does not have advance directive;Patient would not like information Patient would like information  Does patient want to make changes to medical advance directive? Yes (MAU/Ambulatory/Procedural Areas - Information given) - - - - -  Would patient like information on creating a medical advance directive? - No - Patient declined Yes (MAU/Ambulatory/Procedural Areas - Information given) Yes - Educational materials given - Advance directive packet given  Pre-existing out of facility DNR order (yellow form or pink MOST form) - - - - No -    Tobacco Social History   Tobacco Use  Smoking Status Never Smoker  Smokeless Tobacco Never Used     Counseling given: Yes   Clinical Intake:  Pre-visit preparation completed: Yes  Pain : No/denies pain Pain Score: 0-No pain     Nutritional Risks: None Diabetes: No  How often do you need to have someone help you when you read instructions, pamphlets, or other written materials from your doctor or pharmacy?: 1 - Never What is the last grade level you completed in school?: 12  Interpreter Needed?: No     Past Medical History:  Diagnosis Date  . Allergic rhinitis, seasonal   . BPH (benign prostatic hypertrophy)   . Erectile  dysfunction   . History of kidney stones   . Hx of thyroidectomy   . Hyperlipidemia   . Hypertension   . Hypothyroidism   . Pre-diabetes   . Prostate cancer ([place]) 2011   treated with radiation  . Urosepsis 07/2011   ICU admission [facility]   Past Surgical History:  Procedure Laterality Date  . COLONOSCOPY N/A 09/03/2013   Procedure: COLONOSCOPY;  Surgeon: Rogene Houston, MD;  Location: [facility];  Service: Endoscopy;  Laterality: N/A;  1225  . COLONOSCOPY N/A 11/08/2015   Procedure: COLONOSCOPY;  Surgeon: Rogene Houston, MD;  Location: [facility];  Service: Endoscopy;  Laterality: N/A;  1200  . MASS EXCISION N/A 08/15/2017   Procedure: EXCISION SEBACEOUS CYST NECK;  Surgeon: Aviva Signs, MD;  Location: [facility] ORS;  Service: General;  Laterality: N/A;  . PROSTATE SURGERY    . THYROIDECTOMY  2003  . transurethral resection of thr prostate  2003  . ureteral stone extraction  08/2011   Family History  Problem Relation Age of Onset  . Stroke Mother   . Diabetes Father   . Hypertension Father   . Kidney disease Brother   . Stroke Brother   . Diabetes Brother   . Gout Brother   . Diabetes Brother    Social History   Socioeconomic History  . Marital status: Married    Spouse name: Not on file  . Number of children: 5  . Years of education: Not on file  . Highest  education level: Not on file  Occupational History  . Occupation: retired  Scientific laboratory technician  . Financial resource strain: Not very hard  . Food insecurity:    Worry: Never true    Inability: Never true  . Transportation needs:    Medical: No    Non-medical: No  Tobacco Use  . Smoking status: Never Smoker  . Smokeless tobacco: Never Used  Substance and Sexual Activity  . Alcohol use: No    Alcohol/week: 0.0 standard drinks  . Drug use: No  . Sexual activity: Yes  Lifestyle  . Physical activity:    Days per week: 0 days    Minutes per session: 0 min  . Stress: Not at all  Relationships  . Social  connections:    Talks on phone: More than three times a week    Gets together: More than three times a week    Attends religious service: More than 4 times per year    Active member of club or organization: Yes    Attends meetings of clubs or organizations: More than 4 times per year    Relationship status: Married  Other Topics Concern  . Not on file  Social History Narrative  . Not on file    Outpatient Encounter Medications as of 02/18/2019  Medication Sig  . amLODipine (NORVASC) 10 MG tablet Take 1 tablet (10 mg total) by mouth daily.  Marland Kitchen aspirin EC 81 MG tablet Take 1 tablet (81 mg total) by mouth daily.  . hydrochlorothiazide (MICROZIDE) 12.5 MG capsule Take 1 capsule (12.5 mg total) by mouth daily.  Marland Kitchen levothyroxine (SYNTHROID, LEVOTHROID) 75 MCG tablet Take 1 tablet (75 mcg total) by mouth daily.  Marland Kitchen lovastatin (MEVACOR) 40 MG tablet TAKE 1 TABLET BY MOUTH ONCE A DAY.  . Multiple Vitamin (MULTIVITAMIN WITH MINERALS) TABS tablet Take 1 tablet by mouth daily.  Marland Kitchen oxybutynin (DITROPAN) 5 MG tablet Take 1 tablet (5 mg total) by mouth daily.  . potassium chloride SA (K-DUR) 20 MEQ tablet Take 10 mEq by mouth daily.  . [DISCONTINUED] oxybutynin (DITROPAN) 5 MG tablet TAKE 1 TABLET BY MOUTH EVERY DAY FOR URINARY INCONTINENCE   No facility-administered encounter medications on file as of 02/18/2019.     Activities of Daily Living In your present state of health, do you have any difficulty performing the following activities: 02/18/2019  Hearing? N  Vision? N  Difficulty concentrating or making decisions? N  Walking or climbing stairs? N  Dressing or bathing? N  Doing errands, shopping? N  Preparing Food and eating ? N  Using the Toilet? N  In the past six months, have you accidently leaked urine? N  Do you have problems with loss of bowel control? N  Managing your Medications? N  Managing your Finances? N  Housekeeping or managing your Housekeeping? N  Some recent data might be  hidden    Patient Care Team: Fayrene Helper, MD as PCP - General Enzo Montgomery, MD as Attending Physician (Urology)   Assessment:   This is a routine wellness examination for [name].  Exercise Activities and Dietary recommendations Current Exercise Habits: The patient does not participate in regular exercise at present, Exercise limited by: None identified  Goals    . Exercise 3x per week (30 min per time)     Recommend starting a routine exercise program at least 3 days a week for 30-45 minutes at a time as tolerated.         Fall Risk  Fall Risk  02/18/2019 02/10/2019 09/29/2018 05/18/2018 02/16/2018  Falls in the past year? 0 0 0 No No  Injury with Fall? 0 0 - - -   Is the patient's home free of loose throw rugs in walkways, pet beds, electrical cords, etc?   yes      Grab bars in the bathroom? no      Handrails on the stairs?   yes      Adequate lighting?   yes  Timed Get Up and Go Performed:   Depression Screen PHQ 2/9 Scores 02/18/2019 02/10/2019 09/29/2018 05/18/2018  PHQ - 2 Score 0 0 0 0  PHQ- 9 Score - - - -    Cognitive Function     6CIT Screen 02/18/2019 02/16/2018 01/09/2017  What Year? 0 points 0 points 0 points  What month? 0 points 0 points 0 points  What time? 0 points 0 points 0 points  Count back from 20 0 points 0 points 0 points  Months in reverse 0 points 0 points 0 points  Repeat phrase 0 points 0 points 0 points  Total Score 0 0 0    Immunization History  Administered Date(s) Administered  . Influenza Whole 09/28/2004  . Pneumococcal Conjugate-13 09/05/2014  . Pneumococcal Polysaccharide-23 03/19/2004, 07/11/2009  . Td 03/19/2004    Qualifies for Shingles Vaccine?  yes  Screening Tests Health Maintenance  Topic Date Due  . TETANUS/TDAP  03/19/2014  . COLONOSCOPY  11/07/2018  . HEMOGLOBIN A1C  08/12/2019  . PNA vac Low Risk Adult  Completed  . INFLUENZA VACCINE  Discontinued   Cancer Screenings: Lung: Low Dose CT Chest recommended  if Age 35-80 years, 30 pack-year currently smoking OR have quit w/in 15years. Patient does not qualify. Colorectal: up to date  Additional Screenings:   Hepatitis C Screening: due       Plan:      1. Encounter for Medicare annual wellness exam  I have personally reviewed and noted the following in the patient's chart:   . Medical and social history . Use of alcohol, tobacco or illicit drugs  . Current medications and supplements . Functional ability and status . Nutritional status . Physical activity . Advanced directives . List of other physicians . Hospitalizations, surgeries, and ER visits in previous 12 months . Vitals . Screenings to include cognitive, depression, and falls . Referrals and appointments  In addition, I have reviewed and discussed with patient certain preventive protocols, quality metrics, and best practice recommendations. A written personalized care plan for preventive services as well as general preventive health recommendations were provided to patient.   I provided 20  minutes of non-face-to-face time during this encounter.   Perlie Mayo, NP  02/18/2019

## 2019-02-18 NOTE — Patient Instructions (Addendum)
[name] , Thank you for taking time to come for your Medicare Wellness Visit. I appreciate your ongoing commitment to your health goals. Please review the following plan we discussed and let me know if I can assist you in the future.   Screening recommendations/referrals: Colonoscopy: up to date, aged out  Recommended yearly ophthalmology/optometry visit for glaucoma screening and checkup Recommended yearly dental visit for hygiene and checkup  Vaccinations: Influenza vaccine: Due Fall 2020 Pneumococcal vaccine: Completed Tdap vaccine: Due when needed Shingles vaccine: Discuss with Dr Moshe Cipro at next visit  Advanced directives: If you have these provide [place] with a copy, or we can help you get this established at future visits  Next appointment: Oct 13 at 55 am with Dr Moshe Cipro  Preventive Care 6 Years and Older, Male Preventive care refers to lifestyle choices and visits with your health care provider that can promote health and wellness. What does preventive care include?  A yearly physical exam. This is also called an annual well check.  Dental exams once or twice a year.  Routine eye exams. Ask your health care provider how often you should have your eyes checked.  Personal lifestyle choices, including:  Daily care of your teeth and gums.  Regular physical activity.  Eating a healthy diet.  Avoiding tobacco and drug use.  Limiting alcohol use.  Practicing safe sex.  Taking low doses of aspirin every day.  Taking vitamin and mineral supplements as recommended by your health care provider. What happens during an annual well check? The services and screenings done by your health care provider during your annual well check will depend on your age, overall health, lifestyle risk factors, and family history of disease. Counseling  Your health care provider may ask you questions about your:  Alcohol use.  Tobacco use.  Drug use.  Emotional well-being.  Home  and relationship well-being.  Sexual activity.  Eating habits.  History of falls.  Memory and ability to understand (cognition).  Work and work Statistician. Screening  You may have the following tests or measurements:  Height, weight, and BMI.  Blood pressure.  Lipid and cholesterol levels. These may be checked every 5 years, or more frequently if you are over 27 years old.  Skin check.  Lung cancer screening. You may have this screening every year starting at age 17 if you have a 30-pack-year history of smoking and currently smoke or have quit within the past 15 years.  Fecal occult blood test (FOBT) of the stool. You may have this test every year starting at age 84.  Flexible sigmoidoscopy or colonoscopy. You may have a sigmoidoscopy every 5 years or a colonoscopy every 10 years starting at age 57.  Prostate cancer screening. Recommendations will vary depending on your family history and other risks.  Hepatitis C blood test.  Hepatitis B blood test.  Sexually transmitted disease (STD) testing.  Diabetes screening. This is done by checking your blood sugar (glucose) after you have not eaten for a while (fasting). You may have this done every 1-3 years.  Abdominal aortic aneurysm (AAA) screening. You may need this if you are a current or former smoker.  Osteoporosis. You may be screened starting at age 15 if you are at high risk. Talk with your health care provider about your test results, treatment options, and if necessary, the need for more tests. Vaccines  Your health care provider may recommend certain vaccines, such as:  Influenza vaccine. This is recommended every year.  Tetanus, diphtheria, and acellular pertussis (Tdap, Td) vaccine. You may need a Td booster every 10 years.  Zoster vaccine. You may need this after age 59.  Pneumococcal 13-valent conjugate (PCV13) vaccine. One dose is recommended after age 59.  Pneumococcal polysaccharide (PPSV23) vaccine.  One dose is recommended after age 88. Talk to your health care provider about which screenings and vaccines you need and how often you need them. This information is not intended to replace advice given to you by your health care provider. Make sure you discuss any questions you have with your health care provider. Document Released: 10/27/2015 Document Revised: 06/19/2016 Document Reviewed: 08/01/2015 Elsevier Interactive Patient Education  2017 [place] Prevention in the Home Falls can cause injuries. They can happen to people of all ages. There are many things you can do to make your home safe and to help prevent falls. What can I do on the outside of my home?  Regularly fix the edges of walkways and driveways and fix any cracks.  Remove anything that might make you trip as you walk through a door, such as a raised step or threshold.  Trim any bushes or trees on the path to your home.  Use bright outdoor lighting.  Clear any walking paths of anything that might make someone trip, such as rocks or tools.  Regularly check to see if handrails are loose or broken. Make sure that both sides of any steps have handrails.  Any raised decks and porches should have guardrails on the edges.  Have any leaves, snow, or ice cleared regularly.  Use sand or salt on walking paths during winter.  Clean up any spills in your garage right away. This includes oil or grease spills. What can I do in the bathroom?  Use night lights.  Install grab bars by the toilet and in the tub and shower. Do not use towel bars as grab bars.  Use non-skid mats or decals in the tub or shower.  If you need to sit down in the shower, use a plastic, non-slip stool.  Keep the floor dry. Clean up any water that spills on the floor as soon as it happens.  Remove soap buildup in the tub or shower regularly.  Attach bath mats securely with double-sided non-slip rug tape.  Do not have throw rugs and other  things on the floor that can make you trip. What can I do in the bedroom?  Use night lights.  Make sure that you have a light by your bed that is easy to reach.  Do not use any sheets or blankets that are too big for your bed. They should not hang down onto the floor.  Have a firm chair that has side arms. You can use this for support while you get dressed.  Do not have throw rugs and other things on the floor that can make you trip. What can I do in the kitchen?  Clean up any spills right away.  Avoid walking on wet floors.  Keep items that you use a lot in easy-to-reach places.  If you need to reach something above you, use a strong step stool that has a grab bar.  Keep electrical cords out of the way.  Do not use floor polish or wax that makes floors slippery. If you must use wax, use non-skid floor wax.  Do not have throw rugs and other things on the floor that can make you trip. What can I do  with my stairs?  Do not leave any items on the stairs.  Make sure that there are handrails on both sides of the stairs and use them. Fix handrails that are broken or loose. Make sure that handrails are as long as the stairways.  Check any carpeting to make sure that it is firmly attached to the stairs. Fix any carpet that is loose or worn.  Avoid having throw rugs at the top or bottom of the stairs. If you do have throw rugs, attach them to the floor with carpet tape.  Make sure that you have a light switch at the top of the stairs and the bottom of the stairs. If you do not have them, ask someone to add them for you. What else can I do to help prevent falls?  Wear shoes that:  Do not have high heels.  Have rubber bottoms.  Are comfortable and fit you well.  Are closed at the toe. Do not wear sandals.  If you use a stepladder:  Make sure that it is fully opened. Do not climb a closed stepladder.  Make sure that both sides of the stepladder are locked into place.  Ask  someone to hold it for you, if possible.  Clearly mark and make sure that you can see:  Any grab bars or handrails.  First and last steps.  Where the edge of each step is.  Use tools that help you move around (mobility aids) if they are needed. These include:  Canes.  Walkers.  Scooters.  Crutches.  Turn on the lights when you go into a dark area. Replace any light bulbs as soon as they burn out.  Set up your furniture so you have a clear path. Avoid moving your furniture around.  If any of your floors are uneven, fix them.  If there are any pets around you, be aware of where they are.  Review your medicines with your doctor. Some medicines can make you feel dizzy. This can increase your chance of falling. Ask your doctor what other things that you can do to help prevent falls. This information is not intended to replace advice given to you by your health care provider. Make sure you discuss any questions you have with your health care provider. Document Released: 07/27/2009 Document Revised: 03/07/2016 Document Reviewed: 11/04/2014 Elsevier Interactive Patient Education  2017 [employer].

## 2019-03-12 ENCOUNTER — Ambulatory Visit: Payer: PPO

## 2019-03-15 ENCOUNTER — Ambulatory Visit: Payer: PPO

## 2019-04-08 ENCOUNTER — Encounter: Payer: Self-pay | Admitting: *Deleted

## 2019-05-04 ENCOUNTER — Other Ambulatory Visit: Payer: Self-pay | Admitting: Family Medicine

## 2019-05-04 DIAGNOSIS — N39498 Other specified urinary incontinence: Secondary | ICD-10-CM

## 2019-06-24 ENCOUNTER — Other Ambulatory Visit: Payer: Self-pay | Admitting: Family Medicine

## 2019-07-01 ENCOUNTER — Other Ambulatory Visit: Payer: Self-pay

## 2019-07-01 DIAGNOSIS — R6889 Other general symptoms and signs: Secondary | ICD-10-CM | POA: Diagnosis not present

## 2019-07-01 DIAGNOSIS — Z20822 Contact with and (suspected) exposure to covid-19: Secondary | ICD-10-CM

## 2019-07-02 LAB — NOVEL CORONAVIRUS, NAA: SARS-CoV-2, NAA: NOT DETECTED

## 2019-07-05 ENCOUNTER — Telehealth: Payer: Self-pay | Admitting: Family Medicine

## 2019-07-05 NOTE — Telephone Encounter (Signed)
Pt called to get COVID results.  Informed him they are negative.

## 2019-07-27 ENCOUNTER — Other Ambulatory Visit: Payer: Self-pay | Admitting: Family Medicine

## 2019-07-27 ENCOUNTER — Ambulatory Visit: Payer: PPO | Admitting: Family Medicine

## 2019-08-04 ENCOUNTER — Ambulatory Visit (INDEPENDENT_AMBULATORY_CARE_PROVIDER_SITE_OTHER): Payer: PPO | Admitting: Family Medicine

## 2019-08-04 ENCOUNTER — Encounter: Payer: Self-pay | Admitting: Family Medicine

## 2019-08-04 ENCOUNTER — Other Ambulatory Visit: Payer: Self-pay

## 2019-08-04 VITALS — BP 134/64 | HR 86 | Temp 98.4°F | Resp 15 | Ht 67.0 in | Wt 167.8 lb

## 2019-08-04 DIAGNOSIS — E039 Hypothyroidism, unspecified: Secondary | ICD-10-CM

## 2019-08-04 DIAGNOSIS — E559 Vitamin D deficiency, unspecified: Secondary | ICD-10-CM

## 2019-08-04 DIAGNOSIS — E785 Hyperlipidemia, unspecified: Secondary | ICD-10-CM

## 2019-08-04 DIAGNOSIS — R7303 Prediabetes: Secondary | ICD-10-CM

## 2019-08-04 DIAGNOSIS — I10 Essential (primary) hypertension: Secondary | ICD-10-CM | POA: Diagnosis not present

## 2019-08-04 DIAGNOSIS — R7301 Impaired fasting glucose: Secondary | ICD-10-CM | POA: Diagnosis not present

## 2019-08-04 MED ORDER — POTASSIUM CHLORIDE ER 10 MEQ PO TBCR: 10.0000 meq | EXTENDED_RELEASE_TABLET | Freq: Every day | ORAL | 3 refills | Status: DC

## 2019-08-04 MED ORDER — EPINEPHRINE 0.3 MG/0.3ML IJ SOAJ: 0.3000 mg | INTRAMUSCULAR | 2 refills | Status: AC | PRN

## 2019-08-04 NOTE — Progress Notes (Signed)
[name]     MRN: [id number]      DOB: 1941/07/24   HPI [name] is here for follow up and re-evaluation of chronic medical conditions, medication management and review of any available recent lab and radiology data.  Preventive health is updated, specifically  Cancer screening and Immunization.   Questions or concerns regarding consultations or procedures which the PT has had in the interim are  addressed. The PT c/o difficulty swallowing the 20 meq potassium and often breaks in half and sometimes misses the dose C/o left ankle swelling which is painless. s   ROS Denies recent fever or chills. Denies sinus pressure, nasal congestion, ear pain or sore throat. Denies chest congestion, productive cough or wheezing. Denies chest pains, palpitations and leg swelling Denies abdominal pain, nausea, vomiting,diarrhea or constipation.   Denies dysuria, frequency, hesitancy or incontinence. Denies uncontrolled joint pain, swelling and limitation in mobility. Denies headaches, seizures, numbness, or tingling. Denies depression, anxiety or insomnia. Denies skin break down or rash.   PE  BP 134/64   Pulse 86   Temp 98.4 F (36.9 C) (Temporal)   Resp 15   Ht 5\' 7"  (1.702 m)   Wt 167 lb 12.8 oz (76.1 kg)   SpO2 97%   BMI 26.28 kg/m   Patient alert and oriented and in no cardiopulmonary distress.  HEENT: No facial asymmetry, EOMI,     Neck supple .  Chest: Clear to auscultation bilaterally.  CVS: S1, S2 no murmurs, no S3.Regular rate.  ABD: Soft non tender.   Ext: No edema  MS: Adequate though reduced  ROM spine,normal in  shoulders, hips and knees.  Skin: Intact, no ulcerations or rash noted.  Psych: Good eye contact, normal affect. Memory intact not anxious or depressed appearing.  CNS: CN 2-12 intact, power,  normal throughout.no focal deficits noted.   Assessment & Plan  Essential hypertension Controlled, no change in medication DASH diet and  commitment to daily physical activity for a minimum of 30 minutes discussed and encouraged, as a part of hypertension management. The importance of attaining a healthy weight is also discussed.  BP/Weight 08/04/2019 02/18/2019 02/10/2019 09/29/2018 05/18/2018 [id number]  Systolic BP Q000111Q [id number] Q000111Q 123456 [id number] 0000000  Diastolic BP 64 78 78 78 80 88 80  Wt. (Lbs) 167.8 162 162 160 157.12 160 162  BMI 26.28 25.37 25.37 25.06 24.61 25.06 25.37       Hyperlipidemia LDL goal <100 Hyperlipidemia:Low fat diet discussed and encouraged.   Lipid Panel  Lab Results  Component Value Date   CHOL 160 02/10/2019   HDL 64 02/10/2019   LDLCALC 79 02/10/2019   TRIG 87 02/10/2019   CHOLHDL 2.5 02/10/2019   Updated lab needed at/ before next visit.     Prediabetes Patient educated about the importance of limiting  Carbohydrate intake , the need to commit to daily physical activity for a minimum of 30 minutes , and to commit weight loss. The fact that changes in all these areas will reduce or eliminate all together the development of diabetes is stressed.   Diabetic Labs Latest Ref Rng & Units 02/10/2019 09/24/2018 01/09/2018 08/12/2017 07/11/2017  HbA1c <5.7 % of total Hgb 6.4(H) 6.2(H) 6.1(H) - 6.0(H)  Microalbumin 0.00 - 1.89 mg/dL - - - - -  Micro/Creat Ratio 0.0 - 30.0 mg/g - - - - -  Chol <200 mg/dL 160 157 155 - -  HDL > OR = 40 mg/dL 64 68 61 - -  Calc LDL mg/dL (calc) 79 75 80 - -  Triglycerides <150 mg/dL 87 62 67 - -  Creatinine 0.70 - 1.11 mg/dL 1.24(H) 1.19(H) 1.29(H) 1.19 1.17   BP/Weight 08/04/2019 02/18/2019 02/10/2019 09/29/2018 05/18/2018 [id number]  Systolic BP Q000111Q [id number] Q000111Q 123456 123456 0000000  Diastolic BP 64 78 78 78 80 88 80  Wt. (Lbs) 167.8 162 162 160 157.12 160 162  BMI 26.28 25.37 25.37 25.06 24.61 25.06 25.37   Foot/eye exam completion dates 03/21/2010  Foot exam Order yes  Foot Form Completion -    Updated lab needed at/ before next visit.   Hypothyroidism  Updated lab needed at/ before next visit.

## 2019-08-04 NOTE — Patient Instructions (Addendum)
Keep physical exam as before, call if you need me before  Epi pen is prescribed  Please get fasting lipid, cmp and EGFr, TSH , HBA1C and vitamin D level   Please start taking once daily, OTC vit D 3 , 2000 IU once daily  Swollen left ankle is due to arthritis, thankfully you have no pain  Potassium is reduced too 10 meq once daily  Thanks for choosing [facility], we consider it a privelige to serve you.

## 2019-08-06 ENCOUNTER — Encounter: Payer: Self-pay | Admitting: Family Medicine

## 2019-08-06 NOTE — Assessment & Plan Note (Signed)
Controlled, no change in medication DASH diet and commitment to daily physical activity for a minimum of 30 minutes discussed and encouraged, as a part of hypertension management. The importance of attaining a healthy weight is also discussed.  BP/Weight 08/04/2019 02/18/2019 02/10/2019 09/29/2018 05/18/2018 [id number]  Systolic BP Q000111Q [id number] Q000111Q 123456 123456 0000000  Diastolic BP 64 78 78 78 80 88 80  Wt. (Lbs) 167.8 162 162 160 157.12 160 162  BMI 26.28 25.37 25.37 25.06 24.61 25.06 25.37

## 2019-08-06 NOTE — Assessment & Plan Note (Signed)
Patient educated about the importance of limiting  Carbohydrate intake , the need to commit to daily physical activity for a minimum of 30 minutes , and to commit weight loss. The fact that changes in all these areas will reduce or eliminate all together the development of diabetes is stressed.   Diabetic Labs Latest Ref Rng & Units 02/10/2019 09/24/2018 01/09/2018 08/12/2017 07/11/2017  HbA1c <5.7 % of total Hgb 6.4(H) 6.2(H) 6.1(H) - 6.0(H)  Microalbumin 0.00 - 1.89 mg/dL - - - - -  Micro/Creat Ratio 0.0 - 30.0 mg/g - - - - -  Chol <200 mg/dL 160 157 155 - -  HDL > OR = 40 mg/dL 64 68 61 - -  Calc LDL mg/dL (calc) 79 75 80 - -  Triglycerides <150 mg/dL 87 62 67 - -  Creatinine 0.70 - 1.11 mg/dL 1.24(H) 1.19(H) 1.29(H) 1.19 1.17   BP/Weight 08/04/2019 02/18/2019 02/10/2019 09/29/2018 05/18/2018 [id number]  Systolic BP Q000111Q [id number] Q000111Q 123456 123456 0000000  Diastolic BP 64 78 78 78 80 88 80  Wt. (Lbs) 167.8 162 162 160 157.12 160 162  BMI 26.28 25.37 25.37 25.06 24.61 25.06 25.37   Foot/eye exam completion dates 03/21/2010  Foot exam Order yes  Foot Form Completion -    Updated lab needed at/ before next visit.

## 2019-08-06 NOTE — Assessment & Plan Note (Signed)
Updated lab needed at/ before next visit.   

## 2019-08-06 NOTE — Assessment & Plan Note (Signed)
Hyperlipidemia:Low fat diet discussed and encouraged.   Lipid Panel  Lab Results  Component Value Date   CHOL 160 02/10/2019   HDL 64 02/10/2019   LDLCALC 79 02/10/2019   TRIG 87 02/10/2019   CHOLHDL 2.5 02/10/2019   Updated lab needed at/ before next visit.

## 2019-09-25 ENCOUNTER — Other Ambulatory Visit: Payer: Self-pay | Admitting: Family Medicine

## 2019-09-25 DIAGNOSIS — N39498 Other specified urinary incontinence: Secondary | ICD-10-CM

## 2019-09-27 DIAGNOSIS — E785 Hyperlipidemia, unspecified: Secondary | ICD-10-CM | POA: Diagnosis not present

## 2019-09-27 DIAGNOSIS — E559 Vitamin D deficiency, unspecified: Secondary | ICD-10-CM | POA: Diagnosis not present

## 2019-09-27 DIAGNOSIS — R7301 Impaired fasting glucose: Secondary | ICD-10-CM | POA: Diagnosis not present

## 2019-09-27 DIAGNOSIS — E039 Hypothyroidism, unspecified: Secondary | ICD-10-CM | POA: Diagnosis not present

## 2019-09-28 LAB — COMPLETE METABOLIC PANEL WITH GFR
AG Ratio: 1.3 (calc) (ref 1.0–2.5)
ALT: 13 U/L (ref 9–46)
AST: 20 U/L (ref 10–35)
Albumin: 4.1 g/dL (ref 3.6–5.1)
Alkaline phosphatase (APISO): 54 U/L (ref 35–144)
BUN/Creatinine Ratio: 12 (calc) (ref 6–22)
BUN: 14 mg/dL (ref 7–25)
CO2: 30 mmol/L (ref 20–32)
Calcium: 9.3 mg/dL (ref 8.6–10.3)
Chloride: 105 mmol/L (ref 98–110)
Creat: 1.2 mg/dL — ABNORMAL HIGH (ref 0.70–1.11)
GFR, Est African American: 66 mL/min/{1.73_m2} (ref >=60)
GFR, Est Non African American: 57 mL/min/{1.73_m2} — ABNORMAL LOW (ref >=60)
Globulin: 3.2 g/dL (calc) (ref 1.9–3.7)
Glucose, Bld: 119 mg/dL — ABNORMAL HIGH (ref 65–99)
Potassium: 4.4 mmol/L (ref 3.5–5.3)
Sodium: 143 mmol/L (ref 135–146)
Total Bilirubin: 0.9 mg/dL (ref 0.2–1.2)
Total Protein: 7.3 g/dL (ref 6.1–8.1)

## 2019-09-28 LAB — LIPID PANEL
Cholesterol: 144 mg/dL (ref <200)
HDL: 61 mg/dL (ref >=40)
LDL Cholesterol (Calc): 67 mg/dL (calc)
Non-HDL Cholesterol (Calc): 83 mg/dL (calc) (ref <130)
Total CHOL/HDL Ratio: 2.4 (calc) (ref <5.0)
Triglycerides: 77 mg/dL (ref <150)

## 2019-09-28 LAB — TSH: TSH: 2.79 mIU/L (ref 0.40–4.50)

## 2019-09-28 LAB — VITAMIN D 25 HYDROXY (VIT D DEFICIENCY, FRACTURES): Vit D, 25-Hydroxy: 40 ng/mL (ref 30–100)

## 2019-09-28 LAB — HEMOGLOBIN A1C
Hgb A1c MFr Bld: 6.2 % of total Hgb — ABNORMAL HIGH (ref <5.7)
Mean Plasma Glucose: 131 (calc)
eAG (mmol/L): 7.3 (calc)

## 2019-09-30 ENCOUNTER — Ambulatory Visit (INDEPENDENT_AMBULATORY_CARE_PROVIDER_SITE_OTHER): Payer: PPO | Admitting: Family Medicine

## 2019-09-30 ENCOUNTER — Encounter: Payer: Self-pay | Admitting: Family Medicine

## 2019-09-30 ENCOUNTER — Other Ambulatory Visit: Payer: Self-pay

## 2019-09-30 VITALS — BP 122/82 | HR 89 | Temp 98.7°F | Resp 15 | Ht 67.0 in | Wt 162.0 lb

## 2019-09-30 DIAGNOSIS — C61 Malignant neoplasm of prostate: Secondary | ICD-10-CM

## 2019-09-30 DIAGNOSIS — E039 Hypothyroidism, unspecified: Secondary | ICD-10-CM

## 2019-09-30 DIAGNOSIS — I10 Essential (primary) hypertension: Secondary | ICD-10-CM

## 2019-09-30 DIAGNOSIS — Z0000 Encounter for general adult medical examination without abnormal findings: Secondary | ICD-10-CM

## 2019-09-30 DIAGNOSIS — R7303 Prediabetes: Secondary | ICD-10-CM

## 2019-09-30 DIAGNOSIS — E785 Hyperlipidemia, unspecified: Secondary | ICD-10-CM

## 2019-09-30 MED ORDER — HYDROCHLOROTHIAZIDE 12.5 MG PO CAPS: 12.5000 mg | ORAL_CAPSULE | Freq: Every day | ORAL | 1 refills | Status: DC

## 2019-09-30 MED ORDER — LOVASTATIN 40 MG PO TABS: 40.0000 mg | ORAL_TABLET | Freq: Every day | ORAL | 1 refills | Status: DC

## 2019-09-30 NOTE — Progress Notes (Signed)
   [name]     MRN: [id number]      DOB: 1941/05/01   HPI: Patient is in for annual physical exam. No other health concerns are expressed or addressed at the visit. Recent labs,  are reviewed. Immunization is reviewed , and  Is up to date     PE; BP 122/82   Pulse 89   Temp 98.7 F (37.1 C) (Temporal)   Resp 15   Ht 5\' 7"  (1.702 m)   Wt 162 lb (73.5 kg)   SpO2 98%   BMI 25.37 kg/m   Pleasant male, alert and oriented x 3, in no cardio-pulmonary distress. Afebrile. HEENT No facial trauma or asymetry. Sinuses non tender. EOMI External ears normal,  Neck: supple, no adenopathy,JVD or thyromegaly.No bruits.  Chest: Clear to ascultation bilaterally.No crackles or wheezes. Non tender to palpation  Cardiovascular system; Heart sounds normal,  S1 and  S2 ,no S3.  No murmur, or thrill. Apical beat not displaced Peripheral pulses normal.  Abdomen: Soft, non tender, no organomegaly or masses. No bruits. Bowel sounds normal. No guarding, tenderness or rebound.    Musculoskeletal exam: Decreased though adequate  ROM of spine, hips , shoulders and knees. No deformity ,swelling or crepitus noted. No muscle wasting or atrophy.   Neurologic: Cranial nerves 2 to 12 intact. Power, tone ,sensation and reflexes normal throughout. No disturbance in gait. No tremor.  Skin: Intact, no ulceration, erythema , scaling or rash noted. Pigmentation normal throughout  Psych; Normal mood and affect. Judgement and concentration normal   Assessment & Plan:  Annual physical exam Annual exam as documented. Counseling done  re healthy lifestyle involving commitment to 150 minutes exercise per week, heart healthy diet, and attaining healthy weight.The importance of adequate sleep also discussed. Regular seat belt use and home safety, is also discussed. Changes in health habits are decided on by the patient with goals and time frames  set for achieving them. Immunization  and cancer screening needs are specifically addressed at this visit.

## 2019-09-30 NOTE — Patient Instructions (Addendum)
F/U in 6 months, call if you need me sooner  Please continue to follow healthy lifestyle, healthy food choice and keep active  Thanks for choosing [facility], we consider it a privelige to serve you.    Please get fasting lipid, cmp and EGFr, TSH, hBA1C, PSA and CBC 1 week before next visit   Thanks for choosing [facility], we consider it a privelige to serve you.

## 2019-09-30 NOTE — Assessment & Plan Note (Signed)

## 2019-10-03 ENCOUNTER — Encounter: Payer: Self-pay | Admitting: Family Medicine

## 2019-12-01 ENCOUNTER — Other Ambulatory Visit: Payer: Self-pay

## 2019-12-01 ENCOUNTER — Ambulatory Visit: Payer: PPO | Attending: Internal Medicine

## 2019-12-01 DIAGNOSIS — Z23 Encounter for immunization: Secondary | ICD-10-CM

## 2019-12-01 NOTE — Progress Notes (Signed)
   Covid-19 Vaccination Clinic  Name:  [name]    MRN: [id number] DOB: 1941/06/24  12/01/2019  [name] was observed post Covid-19 immunization for 30 minutes based on pre-vaccination screening without incidence. He was provided with Vaccine Information Sheet and instruction to access the V-Safe system.   [name] was instructed to call 911 with any severe reactions post vaccine: Marland Kitchen Difficulty breathing  . Swelling of your face and throat  . A fast heartbeat  . A bad rash all over your body  . Dizziness and weakness    Immunizations Administered    Name Date Dose VIS Date Route   Moderna COVID-19 Vaccine 12/01/2019 12:38 PM 0.5 mL 09/14/2019 Intramuscular   Manufacturer: Moderna   Lot: [id number]   [id number]

## 2019-12-25 ENCOUNTER — Other Ambulatory Visit: Payer: Self-pay | Admitting: Family Medicine

## 2019-12-29 ENCOUNTER — Ambulatory Visit: Payer: PPO | Attending: Internal Medicine

## 2019-12-29 DIAGNOSIS — Z23 Encounter for immunization: Secondary | ICD-10-CM

## 2019-12-29 NOTE — Progress Notes (Signed)
   Covid-19 Vaccination Clinic  Name:  [name]    MRN: [id number] DOB: 08/01/1941  12/29/2019  Mr. Graeser was observed post Covid-19 immunization for 15 minutes without incident. He was provided with Vaccine Information Sheet and instruction to access the V-Safe system.   Mr. Belka was instructed to call 911 with any severe reactions post vaccine: Marland Kitchen Difficulty breathing  . Swelling of face and throat  . A fast heartbeat  . A bad rash all over body  . Dizziness and weakness   Immunizations Administered    Name Date Dose VIS Date Route   Moderna COVID-19 Vaccine 12/29/2019 12:15 PM 0.5 mL 09/14/2019 Intramuscular   Manufacturer: Moderna   Lot: [id number]   [id number]

## 2020-01-11 ENCOUNTER — Other Ambulatory Visit: Payer: Self-pay | Admitting: Family Medicine

## 2020-01-11 DIAGNOSIS — N39498 Other specified urinary incontinence: Secondary | ICD-10-CM

## 2020-03-30 ENCOUNTER — Other Ambulatory Visit: Payer: Self-pay

## 2020-03-30 ENCOUNTER — Encounter: Payer: Self-pay | Admitting: Family Medicine

## 2020-03-30 ENCOUNTER — Ambulatory Visit (INDEPENDENT_AMBULATORY_CARE_PROVIDER_SITE_OTHER): Payer: PPO | Admitting: Family Medicine

## 2020-03-30 VITALS — BP 121/77 | HR 91 | Temp 97.1°F | Resp 16 | Ht 67.0 in | Wt 160.0 lb

## 2020-03-30 DIAGNOSIS — E785 Hyperlipidemia, unspecified: Secondary | ICD-10-CM

## 2020-03-30 DIAGNOSIS — R7303 Prediabetes: Secondary | ICD-10-CM | POA: Diagnosis not present

## 2020-03-30 DIAGNOSIS — I10 Essential (primary) hypertension: Secondary | ICD-10-CM | POA: Diagnosis not present

## 2020-03-30 DIAGNOSIS — E039 Hypothyroidism, unspecified: Secondary | ICD-10-CM

## 2020-03-30 DIAGNOSIS — Z125 Encounter for screening for malignant neoplasm of prostate: Secondary | ICD-10-CM

## 2020-03-30 DIAGNOSIS — E559 Vitamin D deficiency, unspecified: Secondary | ICD-10-CM | POA: Diagnosis not present

## 2020-03-30 DIAGNOSIS — R351 Nocturia: Secondary | ICD-10-CM

## 2020-03-30 LAB — POCT GLYCOSYLATED HEMOGLOBIN (HGB A1C): Hemoglobin A1C: 6 % — AB (ref 4.0–5.6)

## 2020-03-30 NOTE — Patient Instructions (Addendum)
Wellness due please schedule in next 2 to 3 weeks  Annual physical exam with MD in December  Please get fasting CBC, lipid, cmp and eGFR , TSH and vit D and PSA  In the next 1 week please HBA1C in office today, prediabetic  It is important that you exercise regularly at least 30 minutes 5 times a week. If you develop chest pain, have severe difficulty breathing, or feel very tired, stop exercising immediately and seek medical attention   Thanks for choosing [facility], we consider it a privelige to serve you.

## 2020-03-31 NOTE — Assessment & Plan Note (Signed)
Improved, he is applauded on this and encouraged to continue  Patient educated about the importance of limiting  Carbohydrate intake , the need to commit to daily physical activity for a minimum of 30 minutes , and to commit weight loss. The fact that changes in all these areas will reduce or eliminate all together the development of diabetes is stressed.   Diabetic Labs Latest Ref Rng & Units 03/30/2020 09/27/2019 02/10/2019 09/24/2018 01/09/2018  HbA1c 4.0 - 5.6 % 6.0(A) 6.2(H) 6.4(H) 6.2(H) 6.1(H)  Microalbumin 0.00 - 1.89 mg/dL - - - - -  Micro/Creat Ratio 0.0 - 30.0 mg/g - - - - -  Chol <200 mg/dL - 144 160 157 155  HDL > OR = 40 mg/dL - 61 64 68 61  Calc LDL mg/dL (calc) - 67 79 75 80  Triglycerides <150 mg/dL - 77 87 62 67  Creatinine 0.70 - 1.11 mg/dL - 1.20(H) 1.24(H) 1.19(H) 1.29(H)   BP/Weight 03/30/2020 09/30/2019 08/04/2019 02/18/2019 02/10/2019 90/38/3338 12/13/9189  Systolic BP 660 600 459 977 414 239 532  Diastolic BP 77 82 64 78 78 78 80  Wt. (Lbs) 160 162 167.8 162 162 160 157.12  BMI 25.06 25.37 26.28 25.37 25.37 25.06 24.61   Foot/eye exam completion dates 03/21/2010  Foot exam Order yes  Foot Form Completion -

## 2020-03-31 NOTE — Assessment & Plan Note (Signed)
Controlled, no change in medication DASH diet and commitment to daily physical activity for a minimum of 30 minutes discussed and encouraged, as a part of hypertension management. The importance of attaining a healthy weight is also discussed.  BP/Weight 03/30/2020 09/30/2019 08/04/2019 02/18/2019 02/10/2019 73/54/3014 05/17/396  Systolic BP 953 692 230 097 949 971 820  Diastolic BP 77 82 64 78 78 78 80  Wt. (Lbs) 160 162 167.8 162 162 160 157.12  BMI 25.06 25.37 26.28 25.37 25.37 25.06 24.61

## 2020-03-31 NOTE — Assessment & Plan Note (Signed)
Managed with medication

## 2020-03-31 NOTE — Assessment & Plan Note (Signed)
Hyperlipidemia:Low fat diet discussed and encouraged.   Lipid Panel  Lab Results  Component Value Date   CHOL 144 09/27/2019   HDL 61 09/27/2019   LDLCALC 67 09/27/2019   TRIG 77 09/27/2019   CHOLHDL 2.4 09/27/2019   Controlled Updated lab needed at/ before next visit.

## 2020-03-31 NOTE — Assessment & Plan Note (Signed)
Controlled , however updated lab needed

## 2020-03-31 NOTE — Progress Notes (Signed)
[name]     MRN: [id number]      DOB: December 31, 1940   HPI [name] is here for follow up and re-evaluation of chronic medical conditions, medication management and review of any available recent lab and radiology data.  Preventive health is updated, specifically  Cancer screening and Immunization.   Questions or concerns regarding consultations or procedures which [name] has had in Paul interim are  addressed. [name] denies any adverse reactions to current medications since Paul last visit.  There are no new concerns.  There are no specific complaints   ROS Denies recent fever or chills. Denies sinus pressure, nasal congestion, ear pain or sore throat. Denies chest congestion, productive cough or wheezing. Denies chest pains, palpitations and leg swelling Denies abdominal pain, nausea, vomiting,diarrhea or constipation.   Denies dysuria, frequency, hesitancy or incontinence. C/o joint pain, swelling and limitation in mobility. Denies headaches, seizures, numbness, or tingling. Denies depression, anxiety or insomnia. Denies skin break down or rash.   PE  BP 121/77   Pulse 91   Temp (!) 97.1 F (36.2 C) (Temporal)   Resp 16   Ht 5\' 7"  (1.702 m)   Wt 160 lb (72.6 kg)   SpO2 96%   BMI 25.06 kg/m   Patient alert and oriented and in no cardiopulmonary distress.  HEENT: No facial asymmetry, EOMI,     Neck supple .  Chest: Clear to auscultation bilaterally.  CVS: S1, S2 no murmurs, no S3.Regular rate.  ABD: Soft non tender.   Ext: No edema  MS: Adequate though reduced  ROM spine, normal in  shoulders, hips and knees.  Skin: Intact, no ulcerations or rash noted.  Psych: Good eye contact, normal affect. Memory intact not anxious or depressed appearing.  CNS: CN 2-12 intact, power,  normal throughout.no focal deficits noted.   Assessment & Plan  Essential hypertension Controlled, no change in medication DASH diet and commitment to daily physical  activity for a minimum of 30 minutes discussed and encouraged, as a part of hypertension management. Paul importance of attaining a healthy weight is also discussed.  BP/Weight 03/30/2020 09/30/2019 08/04/2019 02/18/2019 02/10/2019 65/99/3570 10/20/7937  Systolic BP 030 092 330 076 226 333 545  Diastolic BP 77 82 64 78 78 78 80  Wt. (Lbs) 160 162 167.8 162 162 160 157.12  BMI 25.06 25.37 26.28 25.37 25.37 25.06 24.61       Hyperlipidemia LDL goal <100 Hyperlipidemia:Low fat diet discussed and encouraged.   Lipid Panel  Lab Results  Component Value Date   CHOL 144 09/27/2019   HDL 61 09/27/2019   LDLCALC 67 09/27/2019   TRIG 77 09/27/2019   CHOLHDL 2.4 09/27/2019   Controlled Updated lab needed at/ before next visit.     Hypothyroidism Controlled , however updated lab needed  Prediabetes Improved, he is applauded on this and encouraged to continue  Patient educated about Paul importance of limiting  Carbohydrate intake , Paul need to commit to daily physical activity for a minimum of 30 minutes , and to commit weight loss. Paul fact that changes in all these areas will reduce or eliminate all together Paul development of diabetes is stressed.   Diabetic Labs Latest Ref Rng & Units 03/30/2020 09/27/2019 02/10/2019 09/24/2018 01/09/2018  HbA1c 4.0 - 5.6 % 6.0(A) 6.2(H) 6.4(H) 6.2(H) 6.1(H)  Microalbumin 0.00 - 1.89 mg/dL - - - - -  Micro/Creat Ratio 0.0 - 30.0 mg/g - - - - -  Chol <200  mg/dL - 144 160 157 155  HDL > OR = 40 mg/dL - 61 64 68 61  Calc LDL mg/dL (calc) - 67 79 75 80  Triglycerides <150 mg/dL - 77 87 62 67  Creatinine 0.70 - 1.11 mg/dL - 1.20(H) 1.24(H) 1.19(H) 1.29(H)   BP/Weight 03/30/2020 09/30/2019 08/04/2019 02/18/2019 02/10/2019 92/90/9030 10/18/9967  Systolic BP 249 324 199 144 458 483 507  Diastolic BP 77 82 64 78 78 78 80  Wt. (Lbs) 160 162 167.8 162 162 160 157.12  BMI 25.06 25.37 26.28 25.37 25.37 25.06 24.61   Foot/eye exam completion dates 03/21/2010  Foot  exam Order yes  Foot Form Completion -      Nocturia more than twice per night Managed with medication

## 2020-04-03 DIAGNOSIS — Z125 Encounter for screening for malignant neoplasm of prostate: Secondary | ICD-10-CM | POA: Diagnosis not present

## 2020-04-03 DIAGNOSIS — E785 Hyperlipidemia, unspecified: Secondary | ICD-10-CM | POA: Diagnosis not present

## 2020-04-03 DIAGNOSIS — E039 Hypothyroidism, unspecified: Secondary | ICD-10-CM | POA: Diagnosis not present

## 2020-04-03 DIAGNOSIS — E559 Vitamin D deficiency, unspecified: Secondary | ICD-10-CM | POA: Diagnosis not present

## 2020-04-03 DIAGNOSIS — I10 Essential (primary) hypertension: Secondary | ICD-10-CM | POA: Diagnosis not present

## 2020-04-05 ENCOUNTER — Other Ambulatory Visit: Payer: Self-pay | Admitting: Family Medicine

## 2020-04-05 DIAGNOSIS — N39498 Other specified urinary incontinence: Secondary | ICD-10-CM

## 2020-04-05 LAB — HEMOGLOBIN A1C W/OUT EAG: Hgb A1c MFr Bld: 6 % of total Hgb — ABNORMAL HIGH (ref <5.7)

## 2020-04-05 LAB — CBC
HCT: 48.2 % (ref 38.5–50.0)
Hemoglobin: 15.5 g/dL (ref 13.2–17.1)
MCH: 27.4 pg (ref 27.0–33.0)
MCHC: 32.2 g/dL (ref 32.0–36.0)
MCV: 85.2 fL (ref 80.0–100.0)
MPV: 12.2 fL (ref 7.5–12.5)
Platelets: 181 10*3/uL (ref 140–400)
RBC: 5.66 10*6/uL (ref 4.20–5.80)
RDW: 13.2 % (ref 11.0–15.0)
WBC: 5.6 10*3/uL (ref 3.8–10.8)

## 2020-04-05 LAB — COMPLETE METABOLIC PANEL WITH GFR
AG Ratio: 1.4 (calc) (ref 1.0–2.5)
ALT: 12 U/L (ref 9–46)
AST: 18 U/L (ref 10–35)
Albumin: 4.4 g/dL (ref 3.6–5.1)
Alkaline phosphatase (APISO): 55 U/L (ref 35–144)
BUN/Creatinine Ratio: 15 (calc) (ref 6–22)
BUN: 19 mg/dL (ref 7–25)
CO2: 27 mmol/L (ref 20–32)
Calcium: 9.9 mg/dL (ref 8.6–10.3)
Chloride: 105 mmol/L (ref 98–110)
Creat: 1.28 mg/dL — ABNORMAL HIGH (ref 0.70–1.11)
GFR, Est African American: 60 mL/min/{1.73_m2} (ref >=60)
GFR, Est Non African American: 52 mL/min/{1.73_m2} — ABNORMAL LOW (ref >=60)
Globulin: 3.1 g/dL (calc) (ref 1.9–3.7)
Glucose, Bld: 110 mg/dL — ABNORMAL HIGH (ref 65–99)
Potassium: 4.2 mmol/L (ref 3.5–5.3)
Sodium: 145 mmol/L (ref 135–146)
Total Bilirubin: 0.9 mg/dL (ref 0.2–1.2)
Total Protein: 7.5 g/dL (ref 6.1–8.1)

## 2020-04-05 LAB — TEST AUTHORIZATION

## 2020-04-05 LAB — LIPID PANEL
Cholesterol: 142 mg/dL (ref <200)
HDL: 65 mg/dL (ref >=40)
LDL Cholesterol (Calc): 64 mg/dL (calc)
Non-HDL Cholesterol (Calc): 77 mg/dL (calc) (ref <130)
Total CHOL/HDL Ratio: 2.2 (calc) (ref <5.0)
Triglycerides: 58 mg/dL (ref <150)

## 2020-04-05 LAB — PSA: PSA: 0.1 ng/mL (ref <=4.0)

## 2020-04-05 LAB — TSH: TSH: 2.25 mIU/L (ref 0.40–4.50)

## 2020-04-05 LAB — VITAMIN D 25 HYDROXY (VIT D DEFICIENCY, FRACTURES): Vit D, 25-Hydroxy: 55 ng/mL (ref 30–100)

## 2020-04-06 ENCOUNTER — Telehealth: Payer: Self-pay

## 2020-04-06 NOTE — Telephone Encounter (Signed)
Pt returning call to abby

## 2020-04-06 NOTE — Telephone Encounter (Signed)
Patient made aware this am of his lab results

## 2020-06-05 ENCOUNTER — Telehealth: Payer: Self-pay

## 2020-06-05 NOTE — Telephone Encounter (Signed)
Please advise if the pt needs a pneumonia Vaccine ---Pt DOES NOT WANT THE FLU VACCINE

## 2020-06-05 NOTE — Telephone Encounter (Signed)
No he does not

## 2020-06-05 NOTE — Telephone Encounter (Signed)
Patient had Pneu 23 in 2005 and 2010 and had Pneu 13 in 2015. Does he need another pneu vac?

## 2020-06-06 NOTE — Telephone Encounter (Signed)
Patient aware.

## 2020-06-12 ENCOUNTER — Other Ambulatory Visit: Payer: Self-pay

## 2020-06-12 ENCOUNTER — Telehealth (INDEPENDENT_AMBULATORY_CARE_PROVIDER_SITE_OTHER): Payer: PPO

## 2020-06-12 VITALS — BP 121/77 | Ht 67.0 in | Wt 160.0 lb

## 2020-06-12 DIAGNOSIS — Z0100 Encounter for examination of eyes and vision without abnormal findings: Secondary | ICD-10-CM

## 2020-06-12 DIAGNOSIS — Z0000 Encounter for general adult medical examination without abnormal findings: Secondary | ICD-10-CM

## 2020-06-12 NOTE — Patient Instructions (Signed)
[name] , Thank you for taking time to come for your Medicare Wellness Visit. I appreciate your ongoing commitment to your health goals. Please review the following plan we discussed and let me know if I can assist you in the future.   Screening recommendations/referrals: Colonoscopy: due in early 2022 Recommended yearly ophthalmology/optometry visit for glaucoma screening and checkup Referral to Dr Gershon Crane for eye exam. Recommended yearly dental visit for hygiene and checkup  Vaccinations: Influenza vaccine: declined Pneumococcal vaccine: up to date Tdap vaccine: not covered as a preventative vaccine Shingles vaccine: qualifies- can get vaccine at a pharmacy if you wish to get the vaccine.        Next appointment: wellness in one year  Preventive Care 14 Years and Older, Male Preventive care refers to lifestyle choices and visits with your health care provider that can promote health and wellness. What does preventive care include?  A yearly physical exam. This is also called an annual well check.  Dental exams once or twice a year.  Routine eye exams. Ask your health care provider how often you should have your eyes checked.  Personal lifestyle choices, including:  Daily care of your teeth and gums.  Regular physical activity.  Eating a healthy diet.  Avoiding tobacco and drug use.  Limiting alcohol use.  Practicing safe sex.  Taking low doses of aspirin every day.  Taking vitamin and mineral supplements as recommended by your health care provider. What happens during an annual well check? The services and screenings done by your health care provider during your annual well check will depend on your age, overall health, lifestyle risk factors, and family history of disease. Counseling  Your health care provider may ask you questions about your:  Alcohol use.  Tobacco use.  Drug use.  Emotional well-being.  Home and relationship well-being.  Sexual  activity.  Eating habits.  History of falls.  Memory and ability to understand (cognition).  Work and work Statistician. Screening  You may have the following tests or measurements:  Height, weight, and BMI.  Blood pressure.  Lipid and cholesterol levels. These may be checked every 5 years, or more frequently if you are over 83 years old.  Skin check.  Lung cancer screening. You may have this screening every year starting at age 33 if you have a 30-pack-year history of smoking and currently smoke or have quit within the past 15 years.  Fecal occult blood test (FOBT) of the stool. You may have this test every year starting at age 75.  Flexible sigmoidoscopy or colonoscopy. You may have a sigmoidoscopy every 5 years or a colonoscopy every 10 years starting at age 32.  Prostate cancer screening. Recommendations will vary depending on your family history and other risks.  Hepatitis C blood test.  Hepatitis B blood test.  Sexually transmitted disease (STD) testing.  Diabetes screening. This is done by checking your blood sugar (glucose) after you have not eaten for a while (fasting). You may have this done every 1-3 years.  Abdominal aortic aneurysm (AAA) screening. You may need this if you are a current or former smoker.  Osteoporosis. You may be screened starting at age 25 if you are at high risk. Talk with your health care provider about your test results, treatment options, and if necessary, the need for more tests. Vaccines  Your health care provider may recommend certain vaccines, such as:  Influenza vaccine. This is recommended every year.  Tetanus, diphtheria, and acellular pertussis (Tdap,  Td) vaccine. You may need a Td booster every 10 years.  Zoster vaccine. You may need this after age 102.  Pneumococcal 13-valent conjugate (PCV13) vaccine. One dose is recommended after age 53.  Pneumococcal polysaccharide (PPSV23) vaccine. One dose is recommended after age  67. Talk to your health care provider about which screenings and vaccines you need and how often you need them. This information is not intended to replace advice given to you by your health care provider. Make sure you discuss any questions you have with your health care provider. Document Released: 10/27/2015 Document Revised: 06/19/2016 Document Reviewed: 08/01/2015 Elsevier Interactive Patient Education  2017 [place] Prevention in the Home Falls can cause injuries. They can happen to people of all ages. There are many things you can do to make your home safe and to help prevent falls. What can I do on the outside of my home?  Regularly fix the edges of walkways and driveways and fix any cracks.  Remove anything that might make you trip as you walk through a door, such as a raised step or threshold.  Trim any bushes or trees on the path to your home.  Use bright outdoor lighting.  Clear any walking paths of anything that might make someone trip, such as rocks or tools.  Regularly check to see if handrails are loose or broken. Make sure that both sides of any steps have handrails.  Any raised decks and porches should have guardrails on the edges.  Have any leaves, snow, or ice cleared regularly.  Use sand or salt on walking paths during winter.  Clean up any spills in your garage right away. This includes oil or grease spills. What can I do in the bathroom?  Use night lights.  Install grab bars by the toilet and in the tub and shower. Do not use towel bars as grab bars.  Use non-skid mats or decals in the tub or shower.  If you need to sit down in the shower, use a plastic, non-slip stool.  Keep the floor dry. Clean up any water that spills on the floor as soon as it happens.  Remove soap buildup in the tub or shower regularly.  Attach bath mats securely with double-sided non-slip rug tape.  Do not have throw rugs and other things on the floor that can make  you trip. What can I do in the bedroom?  Use night lights.  Make sure that you have a light by your bed that is easy to reach.  Do not use any sheets or blankets that are too big for your bed. They should not hang down onto the floor.  Have a firm chair that has side arms. You can use this for support while you get dressed.  Do not have throw rugs and other things on the floor that can make you trip. What can I do in the kitchen?  Clean up any spills right away.  Avoid walking on wet floors.  Keep items that you use a lot in easy-to-reach places.  If you need to reach something above you, use a strong step stool that has a grab bar.  Keep electrical cords out of the way.  Do not use floor polish or wax that makes floors slippery. If you must use wax, use non-skid floor wax.  Do not have throw rugs and other things on the floor that can make you trip. What can I do with my stairs?  Do not  leave any items on the stairs.  Make sure that there are handrails on both sides of the stairs and use them. Fix handrails that are broken or loose. Make sure that handrails are as long as the stairways.  Check any carpeting to make sure that it is firmly attached to the stairs. Fix any carpet that is loose or worn.  Avoid having throw rugs at the top or bottom of the stairs. If you do have throw rugs, attach them to the floor with carpet tape.  Make sure that you have a light switch at the top of the stairs and the bottom of the stairs. If you do not have them, ask someone to add them for you. What else can I do to help prevent falls?  Wear shoes that:  Do not have high heels.  Have rubber bottoms.  Are comfortable and fit you well.  Are closed at the toe. Do not wear sandals.  If you use a stepladder:  Make sure that it is fully opened. Do not climb a closed stepladder.  Make sure that both sides of the stepladder are locked into place.  Ask someone to hold it for you, if  possible.  Clearly mark and make sure that you can see:  Any grab bars or handrails.  First and last steps.  Where the edge of each step is.  Use tools that help you move around (mobility aids) if they are needed. These include:  Canes.  Walkers.  Scooters.  Crutches.  Turn on the lights when you go into a dark area. Replace any light bulbs as soon as they burn out.  Set up your furniture so you have a clear path. Avoid moving your furniture around.  If any of your floors are uneven, fix them.  If there are any pets around you, be aware of where they are.  Review your medicines with your doctor. Some medicines can make you feel dizzy. This can increase your chance of falling. Ask your doctor what other things that you can do to help prevent falls. This information is not intended to replace advice given to you by your health care provider. Make sure you discuss any questions you have with your health care provider. Document Released: 07/27/2009 Document Revised: 03/07/2016 Document Reviewed: 11/04/2014 Elsevier Interactive Patient Education  2017 [employer].

## 2020-06-12 NOTE — Progress Notes (Signed)
Subjective:   [name] is a 79 y.o. male who presents for Medicare Annual/Subsequent preventive examination.  Review of Systems     Cardiac Risk Factors include: advanced age (>68men, >78 women);dyslipidemia;hypertension;male gender     Objective:    Today's Vitals   06/12/20 1523  BP: 121/77  Weight: 160 lb (72.6 kg)  Height: 5\' 7"  (1.702 m)   Body mass index is 25.06 kg/m.  Advanced Directives 02/16/2018 08/12/2017 01/09/2017 11/08/2015 09/03/2013 07/16/2011  Does Patient Have a Medical Advance Directive? Yes No No No Patient does not have advance directive;Patient would not like information Patient would like information  Does patient want to make changes to medical advance directive? Yes (MAU/Ambulatory/Procedural Areas - Information given) - - - - -  Would patient like information on creating a medical advance directive? - No - Patient declined Yes (MAU/Ambulatory/Procedural Areas - Information given) Yes - Educational materials given - Advance directive packet given  Pre-existing out of facility DNR order (yellow form or pink MOST form) - - - - No -    Current Medications (verified) Outpatient Encounter Medications as of 06/12/2020  Medication Sig  . amLODipine (NORVASC) 10 MG tablet TAKE ONE TABLET BY MOUTH ONCE DAILY.  Marland Kitchen aspirin EC 81 MG tablet Take 1 tablet (81 mg total) by mouth daily.  Marland Kitchen EPINEPHrine 0.3 mg/0.3 mL IJ SOAJ injection Inject 0.3 mLs (0.3 mg total) into the muscle as needed for anaphylaxis.  . hydrochlorothiazide (MICROZIDE) 12.5 MG capsule Take 1 capsule (12.5 mg total) by mouth daily.  Marland Kitchen levothyroxine (SYNTHROID, LEVOTHROID) 75 MCG tablet Take 1 tablet (75 mcg total) by mouth daily.  Marland Kitchen lovastatin (MEVACOR) 40 MG tablet TAKE ONE TABLET BY MOUTH ONCE DAILY.  . Multiple Vitamin (MULTIVITAMIN WITH MINERALS) TABS tablet Take 1 tablet by mouth daily.  Marland Kitchen oxybutynin (DITROPAN) 5 MG tablet TAKE 1 TABLET BY MOUTH ONCE DAILY.  Marland Kitchen potassium chloride  (KLOR-CON 10) 10 MEQ tablet Take 1 tablet (10 mEq total) by mouth daily.  . tadalafil (CIALIS) 5 MG tablet Take 1 tablet (5 mg total) by mouth daily.   No facility-administered encounter medications on file as of 06/12/2020.    Allergies (verified) Peanut-containing drug products, Eggs or egg-derived products, Iodine, Shellfish allergy, and Penicillins   History: Past Medical History:  Diagnosis Date  . Allergic rhinitis, seasonal   . BPH (benign prostatic hypertrophy)   . Erectile dysfunction   . History of kidney stones   . Hx of thyroidectomy   . Hyperlipidemia   . Hypertension   . Hypothyroidism   . Pre-diabetes   . Prostate cancer ([place]) 2011   treated with radiation  . Urosepsis 07/2011   ICU admission [facility]   Past Surgical History:  Procedure Laterality Date  . COLONOSCOPY N/A 09/03/2013   Procedure: COLONOSCOPY;  Surgeon: Rogene Houston, MD;  Location: [facility];  Service: Endoscopy;  Laterality: N/A;  1225  . COLONOSCOPY N/A 11/08/2015   Procedure: COLONOSCOPY;  Surgeon: Rogene Houston, MD;  Location: [facility];  Service: Endoscopy;  Laterality: N/A;  1200  . MASS EXCISION N/A 08/15/2017   Procedure: EXCISION SEBACEOUS CYST NECK;  Surgeon: Aviva Signs, MD;  Location: [facility] ORS;  Service: General;  Laterality: N/A;  . PROSTATE SURGERY    . THYROIDECTOMY  2003  . transurethral resection of thr prostate  2003  . ureteral stone extraction  08/2011   Family History  Problem Relation Age of Onset  . Stroke Mother   .  Diabetes Father   . Hypertension Father   . Kidney disease Brother   . Stroke Brother   . Diabetes Brother   . Gout Brother   . Diabetes Brother    Social History   Socioeconomic History  . Marital status: Married    Spouse name: Not on file  . Number of children: 5  . Years of education: Not on file  . Highest education level: Not on file  Occupational History  . Occupation: retired  Tobacco Use  . Smoking status: Never Smoker  .  Smokeless tobacco: Never Used  Vaping Use  . Vaping Use: Never used  Substance and Sexual Activity  . Alcohol use: No    Alcohol/week: 0.0 standard drinks  . Drug use: No  . Sexual activity: Yes  Other Topics Concern  . Not on file  Social History Narrative  . Not on file   Social Determinants of Health   Financial Resource Strain: Low Risk   . Difficulty of Paying Living Expenses: Not hard at all  Food Insecurity: Food Insecurity Present  . Worried About Charity fundraiser in the Last Year: Sometimes true  . Ran Out of Food in the Last Year: Sometimes true  Transportation Needs: No Transportation Needs  . Lack of Transportation (Medical): No  . Lack of Transportation (Non-Medical): No  Physical Activity: Sufficiently Active  . Days of Exercise per Week: 5 days  . Minutes of Exercise per Session: 30 min  Stress:   . Feeling of Stress : Not on file  Social Connections: Moderately Integrated  . Frequency of Communication with Friends and Family: More than three times a week  . Frequency of Social Gatherings with Friends and Family: More than three times a week  . Attends Religious Services: More than 4 times per year  . Active Member of Clubs or Organizations: No  . Attends Archivist Meetings: Never  . Marital Status: Married    Tobacco Counseling Counseling given: Not Answered   Clinical Intake:  Pre-visit preparation completed: Yes  Pain : No/denies pain     Nutritional Status: BMI 25 -29 Overweight Diabetes: No  How often do you need to have someone help you when you read instructions, pamphlets, or other written materials from your doctor or pharmacy?: 1 - Never What is the last grade level you completed in school?: 12th grade  Diabetic? no     Information entered by :: Rebekah Zackery lpn   Activities of Daily Living In your present state of health, do you have any difficulty performing the following activities: 06/12/2020  Hearing? N    Vision? N  Difficulty concentrating or making decisions? N  Walking or climbing stairs? N  Dressing or bathing? N  Doing errands, shopping? N  Preparing Food and eating ? N  Using the Toilet? N  In the past six months, have you accidently leaked urine? N  Do you have problems with loss of bowel control? N  Managing your Medications? N  Managing your Finances? N  Housekeeping or managing your Housekeeping? N  Some recent data might be hidden    Patient Care Team: Fayrene Helper, MD as PCP - General Enzo Montgomery, MD as Attending Physician (Urology)  Indicate any recent Medical Services you may have received from other than [facility] providers in the past year (date may be approximate).     Assessment:   This is a routine wellness examination for [name].  Hearing/Vision screen No exam  data present  Dietary issues and exercise activities discussed: Current Exercise Habits: Home exercise routine, Type of exercise: treadmill, Time (Minutes): 30, Frequency (Times/Week): 5, Weekly Exercise (Minutes/Week): 150, Intensity: Mild  Goals    . DIET - EAT MORE FRUITS AND VEGETABLES     Avoid fried fatty foods    . Exercise 3x per week (30 min per time)     Recommend starting a routine exercise program at least 3 days a week for 30-45 minutes at a time as tolerated.      . Follow up with Primary Care Provider     As recommended by physician      Depression Screen PHQ 2/9 Scores 03/30/2020 09/30/2019 08/04/2019 02/18/2019 02/10/2019 09/29/2018 05/18/2018  PHQ - 2 Score 0 0 0 0 0 0 0  PHQ- 9 Score - - - - - - -    Fall Risk Fall Risk  06/12/2020 09/30/2019 08/04/2019 02/18/2019 02/10/2019  Falls in the past year? 0 0 0 0 0  Number falls in past yr: 0 0 0 - -  Injury with Fall? 0 0 0 0 0    Any stairs in or around the home? Yes  If so, are there any without handrails? No  Home free of loose throw rugs in walkways, pet beds, electrical cords, etc? Yes  Adequate lighting in your  home to reduce risk of falls? Yes   ASSISTIVE DEVICES UTILIZED TO PREVENT FALLS:  Life alert? Yes  Use of a cane, walker or w/c? No  Grab bars in the bathroom? Yes  Shower chair or bench in shower? No  Elevated toilet seat or a handicapped toilet? No   TIMED UP AND GO:  Was the test performed? No .  Length of time to ambulate 10 feet:  sec.     Cognitive Function:     6CIT Screen 06/12/2020 02/18/2019 02/16/2018 01/09/2017  What Year? 0 points 0 points 0 points 0 points  What month? 0 points 0 points 0 points 0 points  What time? 0 points 0 points 0 points 0 points  Count back from 20 0 points 0 points 0 points 0 points  Months in reverse 0 points 0 points 0 points 0 points  Repeat phrase 4 points 0 points 0 points 0 points  Total Score 4 0 0 0    Immunizations Immunization History  Administered Date(s) Administered  . Influenza Whole 09/28/2004  . Moderna SARS-COVID-2 Vaccination 12/01/2019, 12/29/2019  . Pneumococcal Conjugate-13 09/05/2014  . Pneumococcal Polysaccharide-23 03/19/2004, 07/11/2009  . Td 03/19/2004     TDAP status: Up to date Flu Vaccine status: Declined, Education has been provided regarding the importance of this vaccine but patient still declined. Advised may receive this vaccine at local pharmacy or Health Dept. Aware to provide a copy of the vaccination record if obtained from local pharmacy or Health Dept. Verbalized acceptance and understanding. Pneumococcal vaccine status: Up to date Covid-19 vaccine status: Completed vaccines  Qualifies for Shingles Vaccine? Yes   Zostavax completed No   Shingrix Completed?: No.    Education has been provided regarding the importance of this vaccine. Patient has been advised to call insurance company to determine out of pocket expense if they have not yet received this vaccine. Advised may also receive vaccine at local pharmacy or Health Dept. Verbalized acceptance and understanding.  Screening Tests Health  Maintenance  Topic Date Due  . TETANUS/TDAP  08/03/2020 (Originally 03/19/2014)  . COLONOSCOPY  11/07/2020 (Originally 11/07/2018)  . HEMOGLOBIN  A1C  10/03/2020  . COVID-19 Vaccine  Completed  . PNA vac Low Risk Adult  Completed  . INFLUENZA VACCINE  Discontinued    Health Maintenance  There are no preventive care reminders to display for this patient.  Colorectal cancer screening: Completed 10/2015. Repeat every 5 years  Lung Cancer Screening: (Low Dose CT Chest recommended if Age 64-80 years, 30 pack-year currently smoking OR have quit w/in 15years.) does not qualify.   Lung Cancer Screening Referral: does not qualify   Additional Screening:  Hepatitis C Screening: does qualify; Needs a one time screening. Lab ordered  Vision Screening: Recommended annual ophthalmology exams for early detection of glaucoma and other disorders of the eye. Is the patient up to date with their annual eye exam?  No  Who is the provider or what is the name of the office in which the patient attends annual eye exams? Dr Gershon Crane- referral placed If pt is not established with a provider, would they like to be referred to a provider to establish care? Yes .   Dental Screening: Recommended annual dental exams for proper oral hygiene  Community Resource Referral / Chronic Care Management: CRR required this visit?  No   CCM required this visit?  No mailed community resource information for      Plan:     I have personally reviewed and noted the following in the patient's chart:   . Medical and social history . Use of alcohol, tobacco or illicit drugs  . Current medications and supplements . Functional ability and status . Nutritional status . Physical activity . Advanced directives . List of other physicians . Hospitalizations, surgeries, and ER visits in previous 12 months . Vitals . Screenings to include cognitive, depression, and falls . Referrals and appointments  In addition, I have  reviewed and discussed with patient certain preventive protocols, quality metrics, and best practice recommendations. A written personalized care plan for preventive services as well as general preventive health recommendations were provided to patient.     Kate Sable, LPN, LPN   06/11/5620   Nurse Notes: Visit performed by telephone. Patient at home and the provider was located in the office. Time spent with patient 25 mins.

## 2020-06-29 ENCOUNTER — Other Ambulatory Visit: Payer: Self-pay | Admitting: Family Medicine

## 2020-06-29 DIAGNOSIS — N39498 Other specified urinary incontinence: Secondary | ICD-10-CM

## 2020-09-14 DIAGNOSIS — H25813 Combined forms of age-related cataract, bilateral: Secondary | ICD-10-CM | POA: Diagnosis not present

## 2020-09-14 DIAGNOSIS — H5203 Hypermetropia, bilateral: Secondary | ICD-10-CM | POA: Diagnosis not present

## 2020-09-14 DIAGNOSIS — H524 Presbyopia: Secondary | ICD-10-CM | POA: Diagnosis not present

## 2020-09-14 DIAGNOSIS — H52203 Unspecified astigmatism, bilateral: Secondary | ICD-10-CM | POA: Diagnosis not present

## 2020-09-25 ENCOUNTER — Telehealth: Payer: Self-pay

## 2020-09-25 ENCOUNTER — Encounter: Payer: PPO | Admitting: Family Medicine

## 2020-09-25 DIAGNOSIS — E559 Vitamin D deficiency, unspecified: Secondary | ICD-10-CM

## 2020-09-25 DIAGNOSIS — R7303 Prediabetes: Secondary | ICD-10-CM

## 2020-09-25 DIAGNOSIS — I10 Essential (primary) hypertension: Secondary | ICD-10-CM

## 2020-09-25 DIAGNOSIS — E039 Hypothyroidism, unspecified: Secondary | ICD-10-CM

## 2020-09-25 DIAGNOSIS — E785 Hyperlipidemia, unspecified: Secondary | ICD-10-CM

## 2020-09-25 NOTE — Telephone Encounter (Signed)
Fasting lipid, cmp and EGFr, TSH and vit D and HBA1C

## 2020-09-25 NOTE — Telephone Encounter (Signed)
Wants lab order to do before his next appt

## 2020-09-27 NOTE — Telephone Encounter (Signed)
Done

## 2020-09-28 DIAGNOSIS — E785 Hyperlipidemia, unspecified: Secondary | ICD-10-CM | POA: Diagnosis not present

## 2020-09-28 DIAGNOSIS — I10 Essential (primary) hypertension: Secondary | ICD-10-CM | POA: Diagnosis not present

## 2020-09-28 DIAGNOSIS — E559 Vitamin D deficiency, unspecified: Secondary | ICD-10-CM | POA: Diagnosis not present

## 2020-09-28 DIAGNOSIS — R7303 Prediabetes: Secondary | ICD-10-CM | POA: Diagnosis not present

## 2020-09-29 LAB — CMP14+EGFR
ALT: 15 IU/L (ref 0–44)
AST: 23 IU/L (ref 0–40)
Albumin/Globulin Ratio: 1.5 (ref 1.2–2.2)
Albumin: 4.4 g/dL (ref 3.6–4.6)
Alkaline Phosphatase: 71 IU/L (ref 44–121)
BUN/Creatinine Ratio: 12 (ref 10–24)
BUN: 16 mg/dL (ref 8–27)
Bilirubin Total: 0.5 mg/dL (ref 0.0–1.2)
CO2: 27 mmol/L (ref 20–29)
Calcium: 9.6 mg/dL (ref 8.6–10.2)
Chloride: 103 mmol/L (ref 96–106)
Creatinine, Ser: 1.32 mg/dL — ABNORMAL HIGH (ref 0.76–1.27)
GFR calc Af Amer: 58 mL/min/{1.73_m2} — ABNORMAL LOW (ref >59)
GFR calc non Af Amer: 50 mL/min/{1.73_m2} — ABNORMAL LOW (ref >59)
Globulin, Total: 3 g/dL (ref 1.5–4.5)
Glucose: 118 mg/dL — ABNORMAL HIGH (ref 65–99)
Potassium: 3.9 mmol/L (ref 3.5–5.2)
Sodium: 143 mmol/L (ref 134–144)
Total Protein: 7.4 g/dL (ref 6.0–8.5)

## 2020-09-29 LAB — HEMOGLOBIN A1C
Est. average glucose Bld gHb Est-mCnc: 131 mg/dL
Hgb A1c MFr Bld: 6.2 % — ABNORMAL HIGH (ref 4.8–5.6)

## 2020-09-29 LAB — LIPID PANEL
Chol/HDL Ratio: 2.1 ratio (ref 0.0–5.0)
Cholesterol, Total: 151 mg/dL (ref 100–199)
HDL: 71 mg/dL (ref >39)
LDL Chol Calc (NIH): 67 mg/dL (ref 0–99)
Triglycerides: 62 mg/dL (ref 0–149)
VLDL Cholesterol Cal: 13 mg/dL (ref 5–40)

## 2020-09-29 LAB — VITAMIN D 25 HYDROXY (VIT D DEFICIENCY, FRACTURES): Vit D, 25-Hydroxy: 54.7 ng/mL (ref 30.0–100.0)

## 2020-09-29 LAB — TSH: TSH: 3.73 u[IU]/mL (ref 0.450–4.500)

## 2020-10-04 ENCOUNTER — Encounter: Payer: Self-pay | Admitting: Family Medicine

## 2020-10-04 ENCOUNTER — Ambulatory Visit (INDEPENDENT_AMBULATORY_CARE_PROVIDER_SITE_OTHER): Payer: PPO | Admitting: Family Medicine

## 2020-10-04 ENCOUNTER — Other Ambulatory Visit: Payer: Self-pay

## 2020-10-04 VITALS — BP 120/82 | HR 96 | Resp 16 | Ht 67.0 in | Wt 159.0 lb

## 2020-10-04 DIAGNOSIS — E039 Hypothyroidism, unspecified: Secondary | ICD-10-CM

## 2020-10-04 DIAGNOSIS — I10 Essential (primary) hypertension: Secondary | ICD-10-CM

## 2020-10-04 DIAGNOSIS — E785 Hyperlipidemia, unspecified: Secondary | ICD-10-CM

## 2020-10-04 DIAGNOSIS — Z0000 Encounter for general adult medical examination without abnormal findings: Secondary | ICD-10-CM

## 2020-10-04 DIAGNOSIS — R7303 Prediabetes: Secondary | ICD-10-CM

## 2020-10-04 DIAGNOSIS — N39498 Other specified urinary incontinence: Secondary | ICD-10-CM

## 2020-10-04 DIAGNOSIS — C61 Malignant neoplasm of prostate: Secondary | ICD-10-CM

## 2020-10-04 MED ORDER — AMLODIPINE BESYLATE 10 MG PO TABS: 10.0000 mg | ORAL_TABLET | Freq: Every day | ORAL | 1 refills | Status: DC

## 2020-10-04 MED ORDER — HYDROCHLOROTHIAZIDE 12.5 MG PO CAPS: 12.5000 mg | ORAL_CAPSULE | Freq: Every day | ORAL | 1 refills | Status: DC

## 2020-10-04 MED ORDER — OXYBUTYNIN CHLORIDE 5 MG PO TABS: 5.0000 mg | ORAL_TABLET | Freq: Every day | ORAL | 1 refills | Status: DC

## 2020-10-04 MED ORDER — LEVOTHYROXINE SODIUM 75 MCG PO TABS: 75.0000 ug | ORAL_TABLET | Freq: Every day | ORAL | 1 refills | Status: DC

## 2020-10-04 MED ORDER — LOVASTATIN 40 MG PO TABS: 40.0000 mg | ORAL_TABLET | Freq: Every day | ORAL | 1 refills | Status: DC

## 2020-10-04 NOTE — Assessment & Plan Note (Signed)

## 2020-10-04 NOTE — Patient Instructions (Addendum)
F/u in office with MD in 6 months,  call if you need me before  No medication changes  Nurse please refill 90 day supplies with 1 refill amlodipine, hCTZ, lovastatin, L thyroxine and oxybutynin  Blood sugar slightly increased , please reduce sweets and starchy foods and increase vegetables   Please  Get fasting lipid, cmp an eGFR, TSH and PSA ad HBA1C,  6/22 or after  It is important that you exercise regularly at least 30 minutes 5 times a week. If you develop chest pain, have severe difficulty breathing, or feel very tired, stop exercising immediately and seek medical attention  Thanks for choosing [facility], we consider it a privelige to serve you.   Best for 2022!

## 2020-10-04 NOTE — Progress Notes (Signed)
   [name]     MRN: [id number]      DOB: [birth year]   HPI: Patient is in for annual physical exam.  Recent labs,  are reviewed. Immunization is reviewed , and  updated if needed.    PE; BP 120/82   Pulse 96   Resp 16   Ht 5\' 7"  (1.702 m)   Wt 159 lb 0.6 oz (72.1 kg)   SpO2 98%   BMI 24.91 kg/m   Pleasant male, alert and oriented x 3, in no cardio-pulmonary distress. Afebrile. HEENT No facial trauma or asymetry. Sinuses non tender. EOMI External ears normal,  Neck: supple, no adenopathy,JVD or thyromegaly.No bruits.  Chest: Clear to ascultation bilaterally.No crackles or wheezes. Non tender to palpation  Cardiovascular system; Heart sounds normal,  S1 and  S2 ,no S3.  No murmur, or thrill. Apical beat not displaced Peripheral pulses normal.  Abdomen: Soft, non tender, no organomegaly or masses. No bruits. Bowel sounds normal. No guarding, tenderness or rebound.    Musculoskeletal exam: Full ROM of spine, hips , shoulders and knees. No deformity ,swelling or crepitus noted. No muscle wasting or atrophy.   Neurologic: Cranial nerves 2 to 12 intact. Power, tone ,sensation and reflexes normal throughout. No disturbance in gait. No tremor.  Skin: Intact, no ulceration, erythema , scaling or rash noted. Pigmentation normal throughout  Psych; Normal mood and affect. Judgement and concentration normal   Assessment & Plan:  Encounter for annual physical exam Annual exam as documented. Counseling done  re healthy lifestyle involving commitment to 150 minutes exercise per week, heart healthy diet, and attaining healthy weight.The importance of adequate sleep also discussed. Regular seat belt use and home safety, is also discussed. Changes in health habits are decided on by the patient with goals and time frames  set for achieving them. Immunization and cancer screening needs are specifically addressed at this visit.

## 2020-11-09 ENCOUNTER — Encounter (INDEPENDENT_AMBULATORY_CARE_PROVIDER_SITE_OTHER): Payer: Self-pay | Admitting: *Deleted

## 2021-01-04 ENCOUNTER — Other Ambulatory Visit: Payer: Self-pay | Admitting: Family Medicine

## 2021-01-04 DIAGNOSIS — N39498 Other specified urinary incontinence: Secondary | ICD-10-CM

## 2021-02-12 ENCOUNTER — Other Ambulatory Visit: Payer: Self-pay

## 2021-02-12 DIAGNOSIS — N39498 Other specified urinary incontinence: Secondary | ICD-10-CM

## 2021-02-12 MED ORDER — LOVASTATIN 40 MG PO TABS: 40.0000 mg | ORAL_TABLET | Freq: Every day | ORAL | 0 refills | Status: DC

## 2021-02-12 MED ORDER — OXYBUTYNIN CHLORIDE 5 MG PO TABS: 5.0000 mg | ORAL_TABLET | Freq: Every day | ORAL | 0 refills | Status: DC

## 2021-02-12 MED ORDER — HYDROCHLOROTHIAZIDE 12.5 MG PO CAPS: 12.5000 mg | ORAL_CAPSULE | Freq: Every day | ORAL | 0 refills | Status: DC

## 2021-02-12 MED ORDER — POTASSIUM CHLORIDE ER 10 MEQ PO TBCR: 10.0000 meq | EXTENDED_RELEASE_TABLET | Freq: Every day | ORAL | 0 refills | Status: DC

## 2021-02-12 MED ORDER — CETIRIZINE HCL 10 MG PO TABS: 10.0000 mg | ORAL_TABLET | Freq: Every day | ORAL | 0 refills | Status: DC

## 2021-02-12 MED ORDER — AMLODIPINE BESYLATE 10 MG PO TABS: 1.0000 | ORAL_TABLET | Freq: Every day | ORAL | 0 refills | Status: DC

## 2021-04-04 ENCOUNTER — Encounter: Payer: Self-pay | Admitting: Family Medicine

## 2021-04-04 ENCOUNTER — Ambulatory Visit (INDEPENDENT_AMBULATORY_CARE_PROVIDER_SITE_OTHER): Payer: Medicare HMO | Admitting: Family Medicine

## 2021-04-04 ENCOUNTER — Other Ambulatory Visit: Payer: Self-pay

## 2021-04-04 VITALS — BP 124/84 | HR 86 | Resp 16 | Ht 67.0 in | Wt 152.8 lb

## 2021-04-04 DIAGNOSIS — R7303 Prediabetes: Secondary | ICD-10-CM | POA: Diagnosis not present

## 2021-04-04 DIAGNOSIS — R351 Nocturia: Secondary | ICD-10-CM

## 2021-04-04 DIAGNOSIS — F528 Other sexual dysfunction not due to a substance or known physiological condition: Secondary | ICD-10-CM | POA: Diagnosis not present

## 2021-04-04 DIAGNOSIS — I10 Essential (primary) hypertension: Secondary | ICD-10-CM

## 2021-04-04 DIAGNOSIS — J301 Allergic rhinitis due to pollen: Secondary | ICD-10-CM | POA: Diagnosis not present

## 2021-04-04 DIAGNOSIS — D126 Benign neoplasm of colon, unspecified: Secondary | ICD-10-CM

## 2021-04-04 DIAGNOSIS — E785 Hyperlipidemia, unspecified: Secondary | ICD-10-CM

## 2021-04-04 DIAGNOSIS — E039 Hypothyroidism, unspecified: Secondary | ICD-10-CM

## 2021-04-04 DIAGNOSIS — C61 Malignant neoplasm of prostate: Secondary | ICD-10-CM | POA: Diagnosis not present

## 2021-04-04 NOTE — Patient Instructions (Signed)
Annual physical exam in office with MD, 12/23 or after  Please Covid booster by mid July  Please follow through on appointment with Dr Olevia Perches office for colonscopy  Please get the 2 shingles vaccines if able starting in August  Labs today  Work on dental needs please  It is important that you exercise regularly at least 30 minutes 5 times a week. If you develop chest pain, have severe difficulty breathing, or feel very tired, stop exercising immediately and seek medical attention   Thanks for choosing [facility], we consider it a privelige to serve you.

## 2021-04-05 ENCOUNTER — Encounter (INDEPENDENT_AMBULATORY_CARE_PROVIDER_SITE_OTHER): Payer: Self-pay | Admitting: *Deleted

## 2021-04-05 LAB — CMP14+EGFR
ALT: 7 IU/L (ref 0–44)
AST: 17 IU/L (ref 0–40)
Albumin/Globulin Ratio: 1.6 (ref 1.2–2.2)
Albumin: 4.5 g/dL (ref 3.6–4.6)
Alkaline Phosphatase: 63 IU/L (ref 44–121)
BUN/Creatinine Ratio: 12 (ref 10–24)
BUN: 15 mg/dL (ref 8–27)
Bilirubin Total: 0.7 mg/dL (ref 0.0–1.2)
CO2: 24 mmol/L (ref 20–29)
Calcium: 9.4 mg/dL (ref 8.6–10.2)
Chloride: 103 mmol/L (ref 96–106)
Creatinine, Ser: 1.29 mg/dL — ABNORMAL HIGH (ref 0.76–1.27)
Globulin, Total: 2.8 g/dL (ref 1.5–4.5)
Glucose: 98 mg/dL (ref 65–99)
Potassium: 4.1 mmol/L (ref 3.5–5.2)
Sodium: 142 mmol/L (ref 134–144)
Total Protein: 7.3 g/dL (ref 6.0–8.5)
eGFR: 55 mL/min/{1.73_m2} — ABNORMAL LOW (ref >59)

## 2021-04-05 LAB — HEMOGLOBIN A1C
Est. average glucose Bld gHb Est-mCnc: 120 mg/dL
Hgb A1c MFr Bld: 5.8 % — ABNORMAL HIGH (ref 4.8–5.6)

## 2021-04-05 LAB — LIPID PANEL
Chol/HDL Ratio: 2.1 ratio (ref 0.0–5.0)
Cholesterol, Total: 150 mg/dL (ref 100–199)
HDL: 72 mg/dL (ref >39)
LDL Chol Calc (NIH): 66 mg/dL (ref 0–99)
Triglycerides: 55 mg/dL (ref 0–149)
VLDL Cholesterol Cal: 12 mg/dL (ref 5–40)

## 2021-04-05 LAB — TSH: TSH: 3.95 u[IU]/mL (ref 0.450–4.500)

## 2021-04-05 LAB — PSA: Prostate Specific Ag, Serum: 0.1 ng/mL (ref 0.0–4.0)

## 2021-04-06 ENCOUNTER — Encounter: Payer: Self-pay | Admitting: Family Medicine

## 2021-04-06 NOTE — Assessment & Plan Note (Signed)
Controlled, no change in medication  

## 2021-04-06 NOTE — Assessment & Plan Note (Signed)
Controlled, no change in medication DASH diet and commitment to daily physical activity for a minimum of 30 minutes discussed and encouraged, as a part of hypertension management. The importance of attaining a healthy weight is also discussed.  BP/Weight 04/04/2021 10/04/2020 06/12/2020 03/30/2020 09/30/2019 90/21/1155 2/0/8022  Systolic BP 336 122 449 753 005 110 211  Diastolic BP 84 82 77 77 82 64 78  Wt. (Lbs) 152.8 159.04 160 160 162 167.8 162  BMI 23.93 24.91 25.06 25.06 25.37 26.28 25.37

## 2021-04-06 NOTE — Assessment & Plan Note (Signed)
Referred for colonoscopy

## 2021-04-06 NOTE — Assessment & Plan Note (Signed)
Hyperlipidemia:Low fat diet discussed and encouraged.   Lipid Panel  Lab Results  Component Value Date   CHOL 150 04/04/2021   HDL 72 04/04/2021   LDLCALC 66 04/04/2021   TRIG 55 04/04/2021   CHOLHDL 2.1 04/04/2021   Controlled, no change in medication

## 2021-04-06 NOTE — Assessment & Plan Note (Signed)
Managed with daily cialis

## 2021-04-06 NOTE — Progress Notes (Signed)
   [name]     MRN: [id number]      DOB: 1941-09-08   HPI Mr. Hanzlik is here for follow up and re-evaluation of chronic medical conditions, medication management and review of any available recent lab and radiology data.  Preventive health is updated, specifically  Cancer screening and Immunization.   Questions or concerns regarding consultations or procedures which the PT has had in the interim are  addressed. The PT denies any adverse reactions to current medications since the last visit.  There are no new concerns.  There are no specific complaints   ROS Denies recent fever or chills. Denies sinus pressure, nasal congestion, ear pain or sore throat. Denies chest congestion, productive cough or wheezing. Denies chest pains, palpitations and leg swelling Denies abdominal pain, nausea, vomiting,diarrhea or constipation.   Denies dysuria, frequency, hesitancy or incontinence. Denies joint pain, swelling and limitation in mobility. Denies headaches, seizures, numbness, or tingling. Denies depression, anxiety or insomnia. Denies skin break down or rash.   PE  BP 124/84   Pulse 86   Resp 16   Ht 5\' 7"  (1.702 m)   Wt 152 lb 12.8 oz (69.3 kg)   SpO2 98%   BMI 23.93 kg/m   Patient alert and oriented and in no cardiopulmonary distress.  HEENT: No facial asymmetry, EOMI,     Neck supple .  Chest: Clear to auscultation bilaterally.  CVS: S1, S2 no murmurs, no S3.Regular rate.  ABD: Soft non tender.   Ext: No edema  MS: Adequate though reduced ROM spine, shoulders, hips and knees.  Skin: Intact, no ulcerations or rash noted.  Psych: Good eye contact, normal affect. Memory intact not anxious or depressed appearing.  CNS: CN 2-12 intact, power,  normal throughout.no focal deficits noted.   Assessment & Plan  Essential hypertension Controlled, no change in medication DASH diet and commitment to daily physical activity for a minimum of 30 minutes discussed  and encouraged, as a part of hypertension management. The importance of attaining a healthy weight is also discussed.  BP/Weight 04/04/2021 10/04/2020 06/12/2020 03/30/2020 09/30/2019 22/97/9892 10/15/9415  Systolic BP 408 144 818 563 149 702 637  Diastolic BP 84 82 77 77 82 64 78  Wt. (Lbs) 152.8 159.04 160 160 162 167.8 162  BMI 23.93 24.91 25.06 25.06 25.37 26.28 25.37       ALLERGIC RHINITIS, SEASONAL Controlled, no change in medication   ERECTILE DYSFUNCTION Controlled, no change in medication   Hypothyroidism Controlled, no change in medication   Hyperlipidemia LDL goal <100 Hyperlipidemia:Low fat diet discussed and encouraged.   Lipid Panel  Lab Results  Component Value Date   CHOL 150 04/04/2021   HDL 72 04/04/2021   LDLCALC 66 04/04/2021   TRIG 55 04/04/2021   CHOLHDL 2.1 04/04/2021   Controlled, no change in medication     Nocturia more than twice per night Managed with daily cialis  Tubular adenoma of colon Referred for colonoscopy

## 2021-05-15 ENCOUNTER — Other Ambulatory Visit: Payer: Self-pay | Admitting: Family Medicine

## 2021-05-16 ENCOUNTER — Other Ambulatory Visit: Payer: Self-pay | Admitting: Family Medicine

## 2021-05-20 ENCOUNTER — Other Ambulatory Visit: Payer: Self-pay | Admitting: Family Medicine

## 2021-05-20 DIAGNOSIS — N39498 Other specified urinary incontinence: Secondary | ICD-10-CM

## 2021-06-13 ENCOUNTER — Ambulatory Visit (INDEPENDENT_AMBULATORY_CARE_PROVIDER_SITE_OTHER): Payer: Medicare HMO

## 2021-06-13 ENCOUNTER — Other Ambulatory Visit: Payer: Self-pay

## 2021-06-13 DIAGNOSIS — Z0000 Encounter for general adult medical examination without abnormal findings: Secondary | ICD-10-CM | POA: Diagnosis not present

## 2021-06-13 NOTE — Progress Notes (Signed)
Subjective:   [name] is a 80 y.o. male who presents for Medicare Annual/Subsequent preventive examination.I connected with  Burnard Bunting on 06/13/21 by a audio enabled telemedicine application and verified that I am speaking with the correct person using two identifiers.   I discussed the limitations of evaluation and management by telemedicine. The patient expressed understanding and agreed to proceed.   Location of patient: Home   Location of Provider: Office  Persons participating in virtual visit: [name] (patient) and Edgar Frisk, CMA  Review of Systems    Defer to PCP       Objective:    There were no vitals filed for this visit. There is no height or weight on file to calculate BMI.  Advanced Directives 06/13/2021 02/16/2018 08/12/2017 01/09/2017 11/08/2015 09/03/2013 07/16/2011  Does Patient Have a Medical Advance Directive? No Yes No No No Patient does not have advance directive;Patient would not like information Patient would like information  Does patient want to make changes to medical advance directive? - Yes (MAU/Ambulatory/Procedural Areas - Information given) - - - - -  Would patient like information on creating a medical advance directive? - - No - Patient declined Yes (MAU/Ambulatory/Procedural Areas - Information given) Yes - Educational materials given - Advance directive packet given  Pre-existing out of facility DNR order (yellow form or pink MOST form) - - - - - No -    Current Medications (verified) Outpatient Encounter Medications as of 06/13/2021  Medication Sig   amLODipine (NORVASC) 10 MG tablet TAKE 1 TABLET EVERY DAY   aspirin EC 81 MG tablet Take 1 tablet (81 mg total) by mouth daily.   cetirizine (ZYRTEC) 10 MG tablet TAKE 1 TABLET EVERY DAY   EPINEPHrine 0.3 mg/0.3 mL IJ SOAJ injection Inject 0.3 mLs (0.3 mg total) into the muscle as needed for anaphylaxis.   hydrochlorothiazide (MICROZIDE) 12.5 MG capsule TAKE 1 CAPSULE ONE  TIME DAILY   levothyroxine (SYNTHROID) 75 MCG tablet Take 1 tablet (75 mcg total) by mouth daily.   lovastatin (MEVACOR) 40 MG tablet TAKE 1 TABLET EVERY DAY   Multiple Vitamin (MULTIVITAMIN WITH MINERALS) TABS tablet Take 1 tablet by mouth daily.   oxybutynin (DITROPAN) 5 MG tablet TAKE 1 TABLET EVERY DAY   potassium chloride (KLOR-CON) 10 MEQ tablet TAKE 1 TABLET ONE TIME DAILY   tadalafil (CIALIS) 5 MG tablet Take 1 tablet (5 mg total) by mouth daily.   No facility-administered encounter medications on file as of 06/13/2021.    Allergies (verified) Other, Peanut-containing drug products, Eggs or egg-derived products, Iodine, Shellfish allergy, and Penicillins   History: Past Medical History:  Diagnosis Date   Allergic rhinitis, seasonal    BPH (benign prostatic hypertrophy)    Erectile dysfunction    History of kidney stones    Hx of thyroidectomy    Hyperlipidemia    Hypertension    Hypothyroidism    Pre-diabetes    Prostate cancer ([place]) 2011   treated with radiation   Urosepsis 07/2011   ICU admission [facility]   Past Surgical History:  Procedure Laterality Date   COLONOSCOPY N/A 09/03/2013   Procedure: COLONOSCOPY;  Surgeon: Rogene Houston, MD;  Location: [facility];  Service: Endoscopy;  Laterality: N/A;  1225   COLONOSCOPY N/A 11/08/2015   Procedure: COLONOSCOPY;  Surgeon: Rogene Houston, MD;  Location: [facility];  Service: Endoscopy;  Laterality: N/A;  1200   MASS EXCISION N/A 08/15/2017   Procedure: EXCISION SEBACEOUS CYST  NECK;  Surgeon: Aviva Signs, MD;  Location: [facility] ORS;  Service: General;  Laterality: N/A;   PROSTATE SURGERY     THYROIDECTOMY  2003   transurethral resection of thr prostate  2003   ureteral stone extraction  08/2011   Family History  Problem Relation Age of Onset   Stroke Mother    Diabetes Father    Hypertension Father    Kidney disease Brother    Stroke Brother    Diabetes Brother    Gout Brother    Diabetes Brother    Social  History   Socioeconomic History   Marital status: Married    Spouse name: Not on file   Number of children: 5   Years of education: Not on file   Highest education level: Not on file  Occupational History   Occupation: retired  Tobacco Use   Smoking status: Never   Smokeless tobacco: Never  Vaping Use   Vaping Use: Never used  Substance and Sexual Activity   Alcohol use: No    Alcohol/week: 0.0 standard drinks   Drug use: No   Sexual activity: Yes  Other Topics Concern   Not on file  Social History Narrative   Not on file   Social Determinants of Health   Financial Resource Strain: Low Risk    Difficulty of Paying Living Expenses: Not very hard  Food Insecurity: No Food Insecurity   Worried About Charity fundraiser in the Last Year: Never true   [place] in the Last Year: Never true  Transportation Needs: No Transportation Needs   Lack of Transportation (Medical): No   Lack of Transportation (Non-Medical): No  Physical Activity: Inactive   Days of Exercise per Week: 0 days   Minutes of Exercise per Session: 0 min  Stress: No Stress Concern Present   Feeling of Stress : Not at all  Social Connections: Moderately Integrated   Frequency of Communication with Friends and Family: More than three times a week   Frequency of Social Gatherings with Friends and Family: More than three times a week   Attends Religious Services: More than 4 times per year   Active Member of [employer] or Organizations: No   Attends Music therapist: Never   Marital Status: Married    Tobacco Counseling Counseling given: Not Answered   Clinical Intake:  Pre-visit preparation completed: Yes  Pain : No/denies pain     Nutritional Risks: None Diabetes: No  How often do you need to have someone help you when you read instructions, pamphlets, or other written materials from your doctor or pharmacy?: 1 - Never What is the last grade level you completed in school?: High  School  Diabetic?No  Interpreter Needed?: No  Information entered by :: Asaiah Scarber J,CMA   Activities of Daily Living In your present state of health, do you have any difficulty performing the following activities: 06/13/2021  Hearing? N  Vision? N  Difficulty concentrating or making decisions? N  Walking or climbing stairs? N  Dressing or bathing? N  Doing errands, shopping? N  Preparing Food and eating ? N  Using the Toilet? N  In the past six months, have you accidently leaked urine? N  Do you have problems with loss of bowel control? N  Managing your Medications? N  Managing your Finances? N  Housekeeping or managing your Housekeeping? N  Some recent data might be hidden    Patient Care Team: Fayrene Helper, MD  as PCP - General Enzo Montgomery, MD as Attending Physician (Urology)  Indicate any recent Medical Services you may have received from other than [facility] providers in the past year (date may be approximate).     Assessment:   This is a routine wellness examination for [name].  Hearing/Vision screen No results found.  Dietary issues and exercise activities discussed: Current Exercise Habits: The patient does not participate in regular exercise at present   Goals Addressed   None    Depression Screen PHQ 2/9 Scores 06/13/2021 04/04/2021 10/04/2020 03/30/2020 09/30/2019 08/04/2019 02/18/2019  PHQ - 2 Score 0 0 0 0 0 0 0  PHQ- 9 Score - - - - - - -    Fall Risk Fall Risk  06/13/2021 04/04/2021 10/04/2020 06/12/2020 09/30/2019  Falls in the past year? 0 0 0 0 0  Number falls in past yr: 0 0 - 0 0  Injury with Fall? 0 0 - 0 0  Risk for fall due to : No Fall Risks - - - -  Follow up Falls evaluation completed - - - -    FALL RISK PREVENTION PERTAINING TO THE HOME:  Any stairs in or around the home? Yes  If so, are there any without handrails? No  Home free of loose throw rugs in walkways, pet beds, electrical cords, etc? Yes  Adequate lighting in your  home to reduce risk of falls? Yes   ASSISTIVE DEVICES UTILIZED TO PREVENT FALLS:  Life alert? Yes  Use of a cane, walker or w/c? No  Grab bars in the bathroom? Yes  Shower chair or bench in shower? No  Elevated toilet seat or a handicapped toilet? No   TIMED UP AND GO:  Was the test performed?  N/A .  Length of time to ambulate 10 feet: N/A sec.     Cognitive Function:     6CIT Screen 06/13/2021 06/12/2020 02/18/2019 02/16/2018 01/09/2017  What Year? 0 points 0 points 0 points 0 points 0 points  What month? 0 points 0 points 0 points 0 points 0 points  What time? 0 points 0 points 0 points 0 points 0 points  Count back from 20 0 points 0 points 0 points 0 points 0 points  Months in reverse 2 points 0 points 0 points 0 points 0 points  Repeat phrase 0 points 4 points 0 points 0 points 0 points  Total Score 2 4 0 0 0    Immunizations Immunization History  Administered Date(s) Administered   Influenza Whole 09/28/2004   Moderna Sars-Covid-2 Vaccination 12/01/2019, 12/29/2019, 08/29/2020   Pneumococcal Conjugate-13 09/05/2014   Pneumococcal Polysaccharide-23 03/19/2004, 07/11/2009   Td 03/19/2004    TDAP status: Due, Education has been provided regarding the importance of this vaccine. Advised may receive this vaccine at local pharmacy or Health Dept. Aware to provide a copy of the vaccination record if obtained from local pharmacy or Health Dept. Verbalized acceptance and understanding.  Flu Vaccine status: Due, Education has been provided regarding the importance of this vaccine. Advised may receive this vaccine at local pharmacy or Health Dept. Aware to provide a copy of the vaccination record if obtained from local pharmacy or Health Dept. Verbalized acceptance and understanding.  Pneumococcal vaccine status: Up to date  Covid-19 vaccine status: Information provided on how to obtain vaccines.   Qualifies for Shingles Vaccine? Yes   Zostavax completed No   Shingrix  Completed?: No.    Education has been provided regarding the importance of this  vaccine. Patient has been advised to call insurance company to determine out of pocket expense if they have not yet received this vaccine. Advised may also receive vaccine at local pharmacy or Health Dept. Verbalized acceptance and understanding.  Screening Tests Health Maintenance  Topic Date Due   Zoster Vaccines- Shingrix (1 of 2) Never done   COLONOSCOPY (Pts 45-78yr Insurance coverage will need to be confirmed)  11/07/2018   COVID-19 Vaccine (4 - Booster for Moderna series) 11/29/2020   TETANUS/TDAP  10/12/2021 (Originally 03/19/2014)   HEMOGLOBIN A1C  10/04/2021   PNA vac Low Risk Adult  Completed   HPV VACCINES  Aged Out   INFLUENZA VACCINE  Discontinued    Health Maintenance  Health Maintenance Due  Topic Date Due   Zoster Vaccines- Shingrix (1 of 2) Never done   COLONOSCOPY (Pts 45-447yrInsurance coverage will need to be confirmed)  11/07/2018   COVID-19 Vaccine (4 - Booster for Moderna series) 11/29/2020    Colorectal cancer screening: No longer required.   Lung Cancer Screening: (Low Dose CT Chest recommended if Age 82-80ears, 30 pack-year currently smoking OR have quit w/in 15years.) does not qualify.   Lung Cancer Screening Referral: no  Additional Screening:  Hepatitis C Screening: does qualify; Not Completed   Vision Screening: Recommended annual ophthalmology exams for early detection of glaucoma and other disorders of the eye. Is the patient up to date with their annual eye exam?  No  Who is the provider or what is the name of the office in which the patient attends annual eye exams? Dr. ShGershon Cranef pt is not established with a provider, would they like to be referred to a provider to establish care? No .   Dental Screening: Recommended annual dental exams for proper oral hygiene  Community Resource Referral / Chronic Care Management: CRR required this visit?  No   CCM  required this visit?  No      Plan:     I have personally reviewed and noted the following in the patient's chart:   Medical and social history Use of alcohol, tobacco or illicit drugs  Current medications and supplements including opioid prescriptions. Patient is not currently taking opioid prescriptions. Functional ability and status Nutritional status Physical activity Advanced directives List of other physicians Hospitalizations, surgeries, and ER visits in previous 12 months Vitals Screenings to include cognitive, depression, and falls Referrals and appointments  In addition, I have reviewed and discussed with patient certain preventive protocols, quality metrics, and best practice recommendations. A written personalized care plan for preventive services as well as general preventive health recommendations were provided to patient.     [facility]gar [facility]CM[facility] 06/13/2021   Nurse Notes: Non Face to Face 30 minute visit Encounter.    Mr. RaZenk Thank you for taking time to come for your Medicare Wellness Visit. I appreciate your ongoing commitment to your health goals. Please review the following plan we discussed and let me know if I can assist you in the future.   These are the goals we discussed:  Goals      DIET - EAT MORE FRUITS AND VEGETABLES     Avoid fried fatty foods     Exercise 3x per week (30 min per time)     Recommend starting a routine exercise program at least 3 days a week for 30-45 minutes at a time as tolerated.       Follow up with Primary Care Provider  As recommended by physician        This is a list of the screening recommended for you and due dates:  Health Maintenance  Topic Date Due   Zoster (Shingles) Vaccine (1 of 2) Never done   Colon Cancer Screening  11/07/2018   COVID-19 Vaccine (4 - Booster for Moderna series) 11/29/2020   Tetanus Vaccine  10/12/2021*   Hemoglobin A1C  10/04/2021   Pneumonia vaccines  Completed   HPV  Vaccine  Aged Out   Flu Shot  Discontinued  *Topic was postponed. The date shown is not the original due date.    Patient was advised that his PCP office will mail AVS.

## 2021-09-17 DIAGNOSIS — Z0100 Encounter for examination of eyes and vision without abnormal findings: Secondary | ICD-10-CM | POA: Diagnosis not present

## 2021-09-17 DIAGNOSIS — H52203 Unspecified astigmatism, bilateral: Secondary | ICD-10-CM | POA: Diagnosis not present

## 2021-09-17 DIAGNOSIS — H524 Presbyopia: Secondary | ICD-10-CM | POA: Diagnosis not present

## 2021-09-17 DIAGNOSIS — H5203 Hypermetropia, bilateral: Secondary | ICD-10-CM | POA: Diagnosis not present

## 2021-09-17 DIAGNOSIS — H25813 Combined forms of age-related cataract, bilateral: Secondary | ICD-10-CM | POA: Diagnosis not present

## 2021-10-03 ENCOUNTER — Encounter (INDEPENDENT_AMBULATORY_CARE_PROVIDER_SITE_OTHER): Payer: Self-pay

## 2021-10-03 ENCOUNTER — Encounter: Payer: Self-pay | Admitting: Family Medicine

## 2021-10-03 ENCOUNTER — Ambulatory Visit (INDEPENDENT_AMBULATORY_CARE_PROVIDER_SITE_OTHER): Payer: Medicare HMO | Admitting: Family Medicine

## 2021-10-03 ENCOUNTER — Other Ambulatory Visit: Payer: Self-pay

## 2021-10-03 VITALS — BP 120/84 | HR 97 | Resp 16 | Ht 67.0 in | Wt 152.0 lb

## 2021-10-03 DIAGNOSIS — E785 Hyperlipidemia, unspecified: Secondary | ICD-10-CM

## 2021-10-03 DIAGNOSIS — Z599 Problem related to housing and economic circumstances, unspecified: Secondary | ICD-10-CM

## 2021-10-03 DIAGNOSIS — I10 Essential (primary) hypertension: Secondary | ICD-10-CM

## 2021-10-03 DIAGNOSIS — Z0000 Encounter for general adult medical examination without abnormal findings: Secondary | ICD-10-CM | POA: Diagnosis not present

## 2021-10-03 DIAGNOSIS — E559 Vitamin D deficiency, unspecified: Secondary | ICD-10-CM | POA: Diagnosis not present

## 2021-10-03 NOTE — Patient Instructions (Addendum)
F/U in 6 months, call if you need me before  Labs today CBC, vit D, lipid, cmp and EGFr, and TSH   You are referred to case worker for assistance regarding colonoscopy  Best for 2023!  Have breakfast with Mrs Bumgarner, now and againTo keep her happy!!  Thanks for choosing [facility], we consider it a privelige to serve you.

## 2021-10-04 LAB — CMP14+EGFR
ALT: 9 IU/L (ref 0–44)
AST: 21 IU/L (ref 0–40)
Albumin/Globulin Ratio: 1.5 (ref 1.2–2.2)
Albumin: 4.5 g/dL (ref 3.6–4.6)
Alkaline Phosphatase: 75 IU/L (ref 44–121)
BUN/Creatinine Ratio: 9 — ABNORMAL LOW (ref 10–24)
BUN: 11 mg/dL (ref 8–27)
Bilirubin Total: 1.4 mg/dL — ABNORMAL HIGH (ref 0.0–1.2)
CO2: 27 mmol/L (ref 20–29)
Calcium: 9.6 mg/dL (ref 8.6–10.2)
Chloride: 99 mmol/L (ref 96–106)
Creatinine, Ser: 1.29 mg/dL — ABNORMAL HIGH (ref 0.76–1.27)
Globulin, Total: 3.1 g/dL (ref 1.5–4.5)
Glucose: 93 mg/dL (ref 70–99)
Potassium: 4.2 mmol/L (ref 3.5–5.2)
Sodium: 140 mmol/L (ref 134–144)
Total Protein: 7.6 g/dL (ref 6.0–8.5)
eGFR: 55 mL/min/{1.73_m2} — ABNORMAL LOW (ref >59)

## 2021-10-04 LAB — LIPID PANEL
Chol/HDL Ratio: 2.2 ratio (ref 0.0–5.0)
Cholesterol, Total: 163 mg/dL (ref 100–199)
HDL: 73 mg/dL (ref >39)
LDL Chol Calc (NIH): 79 mg/dL (ref 0–99)
Triglycerides: 52 mg/dL (ref 0–149)
VLDL Cholesterol Cal: 11 mg/dL (ref 5–40)

## 2021-10-04 LAB — CBC
Hematocrit: 45.8 % (ref 37.5–51.0)
Hemoglobin: 14.8 g/dL (ref 13.0–17.7)
MCH: 26.6 pg (ref 26.6–33.0)
MCHC: 32.3 g/dL (ref 31.5–35.7)
MCV: 82 fL (ref 79–97)
Platelets: 211 10*3/uL (ref 150–450)
RBC: 5.56 x10E6/uL (ref 4.14–5.80)
RDW: 14.5 % (ref 11.6–15.4)
WBC: 6.1 10*3/uL (ref 3.4–10.8)

## 2021-10-04 LAB — VITAMIN D 25 HYDROXY (VIT D DEFICIENCY, FRACTURES): Vit D, 25-Hydroxy: 41.2 ng/mL (ref 30.0–100.0)

## 2021-10-04 LAB — TSH: TSH: 2.62 u[IU]/mL (ref 0.450–4.500)

## 2021-10-07 ENCOUNTER — Encounter: Payer: Self-pay | Admitting: Family Medicine

## 2021-10-07 DIAGNOSIS — Z599 Problem related to housing and economic circumstances, unspecified: Secondary | ICD-10-CM | POA: Insufficient documentation

## 2021-10-07 NOTE — Progress Notes (Signed)
° °  [name]     MRN: [id number]      DOB: [birth year]   HPI: Patient is in for annual physical exam. C/O COST OF COLONOSCOPY PREVENTING HIM FROM GETTING IT DONE Recent labs, if available are reviewed. Immunization is reviewed , and  updated if needed.    PE; BP 120/84    Pulse 97    Resp 16    Ht 5\' 7"  (1.702 m)    Wt 152 lb (68.9 kg)    SpO2 97%    BMI 23.81 kg/m   Pleasant male, alert and oriented x 3, in no cardio-pulmonary distress. Afebrile. HEENT No facial trauma or asymetry. Sinuses non tender. EOMI External ears normal,  Neck: supple, no adenopathy,JVD or thyromegaly.No bruits.  Chest: Clear to ascultation bilaterally.No crackles or wheezes. Non tender to palpation  Cardiovascular system; Heart sounds normal,  S1 and  S2 ,no S3.  No murmur, or thrill. Apical beat not displaced Peripheral pulses normal.  Abdomen: Soft, non tender, no organomegaly or masses. No bruits. Bowel sounds normal. No guarding, tenderness or rebound.    Musculoskeletal exam: DECREASED THOUGH ADEQUATE  ROM of spine, hips , shoulders and knees. No deformity ,swelling or crepitus noted. No muscle wasting or atrophy.   Neurologic: Cranial nerves 2 to 12 intact. Power, tone ,sensation and reflexes normal throughout. No disturbance in gait. No tremor.  Skin: Intact, no ulceration, erythema , scaling or rash noted. Pigmentation normal throughout  Psych; Normal mood and affect. Judgement and concentration normal   Assessment & Plan:  Annual physical exam Annual exam as documented. Counseling done  re healthy lifestyle involving commitment to 150 minutes exercise per week, heart healthy diet, and attaining healthy weight.The importance of adequate sleep also discussed. Regular seat belt use and home safety, is also discussed. Changes in health habits are decided on by the patient with goals and time frames  set for achieving them. Immunization and cancer screening needs  are specifically addressed at this visit.   Financial difficulties Needs colonoscopy and concerned about cost, needs assistance navigating this , will consult social work

## 2021-10-07 NOTE — Assessment & Plan Note (Signed)
Needs colonoscopy and concerned about cost, needs assistance navigating this , will consult social work

## 2021-10-07 NOTE — Assessment & Plan Note (Signed)

## 2021-11-07 ENCOUNTER — Other Ambulatory Visit: Payer: Self-pay

## 2021-11-07 ENCOUNTER — Telehealth: Payer: Self-pay | Admitting: *Deleted

## 2021-11-07 MED ORDER — TADALAFIL 5 MG PO TABS: 5.0000 mg | ORAL_TABLET | Freq: Every day | ORAL | 3 refills | Status: DC

## 2021-11-07 NOTE — Telephone Encounter (Signed)
° °  Telephone encounter was:  Unsuccessful.  11/07/2021 Name: [name] MRN: [id number] DOB: May 10, 1941  Unsuccessful outbound call made today to assist with:  Insurance information   Outreach Attempt:  1st Attempt  A HIPAA compliant voice message was left requesting a return call.  Instructed patient to call back at   Instructed patient to call back at [phone]  at their earliest convenience. . [place], Care Management  [phone] [address] , [place] [address] Email : [email or web address]

## 2021-11-12 ENCOUNTER — Telehealth: Payer: Self-pay | Admitting: *Deleted

## 2021-11-12 NOTE — Telephone Encounter (Signed)
° °  Telephone encounter was:  Successful.  11/12/2021 Name: [name] MRN: [id number] DOB: 1941-02-11  [name] is a 81 y.o. year old male who is a primary care patient of Moshe Cipro, Norwood Levo, MD . The community resource team was consulted for assistance with  insurance information   Care guide performed the following interventions: Patient provided with information about care guide support team and interviewed to confirm resource needs Follow up call placed to community resources to determine status of patients referral.Patient was online while we called [facility] Medicare together they have a copay advised maybe  he ask for cologuard instead and I talked with him about applying for energy assistance maybe that will provide what he needs for a copay  Follow Up Plan:  No further follow up planned at this time. The patient has been provided with needed resources.  [place], Care Management  [phone] [address] , [place] [address] Email : [email or web address]

## 2021-12-03 ENCOUNTER — Other Ambulatory Visit: Payer: Self-pay | Admitting: Family Medicine

## 2021-12-03 DIAGNOSIS — N39498 Other specified urinary incontinence: Secondary | ICD-10-CM

## 2022-02-07 ENCOUNTER — Ambulatory Visit (INDEPENDENT_AMBULATORY_CARE_PROVIDER_SITE_OTHER): Payer: Medicare HMO | Admitting: Family Medicine

## 2022-02-07 ENCOUNTER — Ambulatory Visit (HOSPITAL_COMMUNITY)
Admission: RE | Admit: 2022-02-07 | Discharge: 2022-02-07 | Disposition: A | Discharge status: Home / Self Care | Payer: Medicare HMO | Source: Ambulatory Visit | Attending: Family Medicine | Admitting: Family Medicine

## 2022-02-07 ENCOUNTER — Encounter: Payer: Self-pay | Admitting: Family Medicine

## 2022-02-07 VITALS — BP 147/92 | HR 94 | Resp 16 | Ht 66.0 in | Wt 153.0 lb

## 2022-02-07 DIAGNOSIS — M25551 Pain in right hip: Secondary | ICD-10-CM | POA: Diagnosis not present

## 2022-02-07 DIAGNOSIS — R9431 Abnormal electrocardiogram [ECG] [EKG]: Secondary | ICD-10-CM | POA: Diagnosis not present

## 2022-02-07 DIAGNOSIS — I499 Cardiac arrhythmia, unspecified: Secondary | ICD-10-CM

## 2022-02-07 DIAGNOSIS — T733XXA Exhaustion due to excessive exertion, initial encounter: Secondary | ICD-10-CM | POA: Diagnosis not present

## 2022-02-07 DIAGNOSIS — E039 Hypothyroidism, unspecified: Secondary | ICD-10-CM | POA: Diagnosis not present

## 2022-02-07 DIAGNOSIS — R7303 Prediabetes: Secondary | ICD-10-CM | POA: Diagnosis not present

## 2022-02-07 DIAGNOSIS — M545 Low back pain, unspecified: Secondary | ICD-10-CM | POA: Diagnosis not present

## 2022-02-07 DIAGNOSIS — M79606 Pain in leg, unspecified: Secondary | ICD-10-CM | POA: Insufficient documentation

## 2022-02-07 DIAGNOSIS — E785 Hyperlipidemia, unspecified: Secondary | ICD-10-CM | POA: Diagnosis not present

## 2022-02-07 DIAGNOSIS — I10 Essential (primary) hypertension: Secondary | ICD-10-CM

## 2022-02-07 MED ORDER — METHYLPREDNISOLONE ACETATE 80 MG/ML IJ SUSP
40.0000 mg | Freq: Once | INTRAMUSCULAR | Status: AC
  Administered 2022-02-07: 40 mg via INTRAMUSCULAR

## 2022-02-07 MED ORDER — KETOROLAC TROMETHAMINE 60 MG/2ML IM SOLN
30.0000 mg | Freq: Once | INTRAMUSCULAR | Status: AC
  Administered 2022-02-07: 30 mg via INTRAMUSCULAR

## 2022-02-07 MED ORDER — PREDNISONE 10 MG PO TABS: 10.0000 mg | ORAL_TABLET | Freq: Two times a day (BID) | ORAL | 0 refills | Status: DC

## 2022-02-07 NOTE — Assessment & Plan Note (Signed)
Old infarcts noted in anterior and inferior leads, and pt reports increased exertional fatigue and poor exercise tolerance in past 6 months, cardiology eval ?

## 2022-02-07 NOTE — Assessment & Plan Note (Signed)
incrasing in past 6 months, up to a 9 and limiting movement, toradol 30 mg , depo medrol 40 mg and short course of prednisone. X ray lumbar spine ?

## 2022-02-07 NOTE — Assessment & Plan Note (Signed)
EKG to evaluate, reports poor exercise tolerance ?

## 2022-02-07 NOTE — Assessment & Plan Note (Signed)
Hyperlipidemia:Low fat diet discussed and encouraged. ? ? ?Lipid Panel  ?Lab Results  ?Component Value Date  ? CHOL 163 10/03/2021  ? HDL 73 10/03/2021  ? [place] 79 10/03/2021  ? TRIG 52 10/03/2021  ? CHOLHDL 2.2 10/03/2021  ? ? ? ?Updated lab needed at/ before next visit. ? ?

## 2022-02-07 NOTE — Assessment & Plan Note (Signed)
Updated lab needed at/ before next visit. Controlled when last checked 

## 2022-02-07 NOTE — Assessment & Plan Note (Signed)
Uncontrolled.Toradol and depo medrol administered IM in the office , to be followed by a short course of oral prednisone and X ray of hip ? ?

## 2022-02-07 NOTE — Progress Notes (Signed)
? ?[name]     MRN: [id number]      DOB: 1940-11-29 ? ? ?HPI ?[name] is here for follow up and re-evaluation of chronic medical conditions, medication management and review of any available recent lab and radiology data.  ?Preventive health is updated, specifically  Cancer screening and Immunization.   ?Questions or concerns regarding consultations or procedures which the PT has had in the interim are  addressed. ?C/o increased right hip and lateral thigh pain, worse after sitting for a while, eases over time when up and about, difficulty walking initially due to severe pain can barely lift the leg,at times rated at 9 yesterday, had severe difficulty walking 1 week ago due to pain, requests handicap sticker ?C/o eft ankle swelling and deformity, h/o direct trauma less than 1 year ago ?C/o reduced exercise tolerance progresing over past approx 6 months ?ROS ?Denies recent fever or chills. ?Denies sinus pressure, nasal congestion, ear pain or sore throat. ?Denies chest congestion, productive cough or wheezing. ?Denies chest pains, palpitations and leg swelling ?Denies abdominal pain, nausea, vomiting,diarrhea or constipation.   ?Denies dysuria, frequency, hesitancy or incontinence. ? ?Denies headaches, seizures, numbness, or tingling. ?Denies depression, anxiety or insomnia. ?Denies skin break down or rash. ? ? ?PE ? ?BP (!) 147/92   Pulse 94   Resp 16   Ht '5\' 6"'$  (1.676 m)   Wt 153 lb (69.4 kg)   SpO2 96%   BMI 24.69 kg/m?  ? ?Patient alert and oriented and in no cardiopulmonary distress. ? ?HEENT: No facial asymmetry, EOMI,     Neck supple . ? ?Chest: Clear to auscultation bilaterally. ? ?CVS: S1, S2 no murmurs, no S3.Regular rate. ?EKG: sinus rhythm  with pVC, ischemia in anterior and inferior leads ?ABD: Soft non tender.  ? ?Ext: No edema ? ?MS: decreased  ROM  lumbar spine, and right hip. ? ?Skin: Intact, no ulcerations or rash noted. ? ?Psych: Good eye contact, normal affect. Memory intact  not anxious or depressed appearing. ? ?CNS: CN 2-12 intact, power,  normal throughout.no focal deficits noted. ? ? ?Assessment & Plan ? ?Low back pain radiating to lower extremity ?incrasing in past 6 months, up to a 9 and limiting movement, toradol 30 mg , depo medrol 40 mg and short course of prednisone. X ray lumbar spine ? ?Right hip pain ?Uncontrolled.Toradol and depo medrol administered IM in the office , to be followed by a short course of oral prednisone and X ray of hip ? ? ?Abnormal EKG ?Old infarcts noted in anterior and inferior leads, and pt reports increased exertional fatigue and poor exercise tolerance in past 6 months, cardiology eval ? ?Irregular heart rate ?EKG to evaluate, reports poor exercise tolerance ? ?Prediabetes ?Patient educated about the importance of limiting  Carbohydrate intake , the need to commit to daily physical activity for a minimum of 30 minutes , and to commit weight loss. ?The fact that changes in all these areas will reduce or eliminate all together the development of diabetes is stressed.  ? ? ?  Latest Ref Rng & Units 10/03/2021  ?  2:03 PM 04/04/2021  ?  1:34 PM 09/28/2020  ?  9:52 AM 04/03/2020  ? 11:22 AM 03/30/2020  ?  2:16 PM  ?Diabetic Labs  ?HbA1c 4.8 - 5.6 %  5.8   6.2   6.0   6.0    ?Chol 100 - 199 mg/dL 163   150   151   142     ?  HDL >39 mg/dL 73   72   71   65     ?Calc LDL 0 - 99 mg/dL 79   66   67   64     ?Triglycerides 0 - 149 mg/dL 52   55   62   58     ?Creatinine 0.76 - 1.27 mg/dL 1.29   1.29   1.32   1.28     ? ? ?  02/07/2022  ?  9:45 AM 10/03/2021  ?  1:33 PM 10/03/2021  ?  1:10 PM 04/04/2021  ?  1:23 PM 04/04/2021  ?  1:04 PM 10/04/2020  ?  1:23 PM 10/04/2020  ?  1:03 PM  ?BP/Weight  ?Systolic BP 681 275 170 017 139 120 146  ?Diastolic BP 92 84 85 84 83 82 71  ?Wt. (Lbs) 153  152  152.8  159.04  ?BMI 24.69 kg/m2  23.81 kg/m2  23.93 kg/m2  24.91 kg/m2  ? ? ?  03/21/2010  ? 12:00 AM  ?Foot/eye exam completion dates  ?Foot exam Order yes    ? ? ?Updated lab  needed at/ before next visit. ? ? ?Hyperlipidemia LDL goal <100 ?Hyperlipidemia:Low fat diet discussed and encouraged. ? ? ?Lipid Panel  ?Lab Results  ?Component Value Date  ? CHOL 163 10/03/2021  ? HDL 73 10/03/2021  ? [place] 79 10/03/2021  ? TRIG 52 10/03/2021  ? CHOLHDL 2.2 10/03/2021  ? ? ? ?Updated lab needed at/ before next visit. ? ? ?Hypothyroidism ?Updated lab needed at/ before next visit. ?Controlled when last checked ? ?

## 2022-02-07 NOTE — Assessment & Plan Note (Signed)
Patient educated about the importance of limiting  Carbohydrate intake , the need to commit to daily physical activity for a minimum of 30 minutes , and to commit weight loss. ?The fact that changes in all these areas will reduce or eliminate all together the development of diabetes is stressed.  ? ? ?  Latest Ref Rng & Units 10/03/2021  ?  2:03 PM 04/04/2021  ?  1:34 PM 09/28/2020  ?  9:52 AM 04/03/2020  ? 11:22 AM 03/30/2020  ?  2:16 PM  ?Diabetic Labs  ?HbA1c 4.8 - 5.6 %  5.8   6.2   6.0   6.0    ?Chol 100 - 199 mg/dL 163   150   151   142     ?HDL >39 mg/dL 73   72   71   65     ?Calc LDL 0 - 99 mg/dL 79   66   67   64     ?Triglycerides 0 - 149 mg/dL 52   55   62   58     ?Creatinine 0.76 - 1.27 mg/dL 1.29   1.29   1.32   1.28     ? ? ?  02/07/2022  ?  9:45 AM 10/03/2021  ?  1:33 PM 10/03/2021  ?  1:10 PM 04/04/2021  ?  1:23 PM 04/04/2021  ?  1:04 PM 10/04/2020  ?  1:23 PM 10/04/2020  ?  1:03 PM  ?BP/Weight  ?Systolic BP 387 564 332 951 139 120 146  ?Diastolic BP 92 84 85 84 83 82 71  ?Wt. (Lbs) 153  152  152.8  159.04  ?BMI 24.69 kg/m2  23.81 kg/m2  23.93 kg/m2  24.91 kg/m2  ? ? ?  03/21/2010  ? 12:00 AM  ?Foot/eye exam completion dates  ?Foot exam Order yes    ? ? ?Updated lab needed at/ before next visit. ? ?

## 2022-02-07 NOTE — Patient Instructions (Addendum)
F/u in 6 weeks, call if you ned me sooner ? ?Toradol 30 mg and Depo medrol 40 mg IM in office today for back, hip and right leg pain.  ? ?Please get X rays today of right hip and low back ? ?You are being referred to Cardiology ? ?EKG in office today ? ?Handicap sticker today ? ?NEED to complete form for colonoscopy referral,( nurse pls refer to community services for help with this) ? ?Lipid, cmp and EGFr, hBA1C and TSH today ? ?Thanks for choosing [facility], we consider it a privelige to serve you. ? ? ? ?

## 2022-02-08 LAB — CMP14+EGFR
ALT: 11 IU/L (ref 0–44)
AST: 17 IU/L (ref 0–40)
Albumin/Globulin Ratio: 1.4 (ref 1.2–2.2)
Albumin: 4.3 g/dL (ref 3.6–4.6)
Alkaline Phosphatase: 68 IU/L (ref 44–121)
BUN/Creatinine Ratio: 11 (ref 10–24)
BUN: 13 mg/dL (ref 8–27)
Bilirubin Total: 0.7 mg/dL (ref 0.0–1.2)
CO2: 26 mmol/L (ref 20–29)
Calcium: 9.6 mg/dL (ref 8.6–10.2)
Chloride: 102 mmol/L (ref 96–106)
Creatinine, Ser: 1.18 mg/dL (ref 0.76–1.27)
Globulin, Total: 3.1 g/dL (ref 1.5–4.5)
Glucose: 110 mg/dL — ABNORMAL HIGH (ref 70–99)
Potassium: 4.2 mmol/L (ref 3.5–5.2)
Sodium: 142 mmol/L (ref 134–144)
Total Protein: 7.4 g/dL (ref 6.0–8.5)
eGFR: 61 mL/min/{1.73_m2} (ref >59)

## 2022-02-08 LAB — LIPID PANEL
Chol/HDL Ratio: 2.1 ratio (ref 0.0–5.0)
Cholesterol, Total: 146 mg/dL (ref 100–199)
HDL: 70 mg/dL (ref >39)
LDL Chol Calc (NIH): 63 mg/dL (ref 0–99)
Triglycerides: 66 mg/dL (ref 0–149)
VLDL Cholesterol Cal: 13 mg/dL (ref 5–40)

## 2022-02-08 LAB — TSH: TSH: 3.78 u[IU]/mL (ref 0.450–4.500)

## 2022-02-08 LAB — HEMOGLOBIN A1C
Est. average glucose Bld gHb Est-mCnc: 128 mg/dL
Hgb A1c MFr Bld: 6.1 % — ABNORMAL HIGH (ref 4.8–5.6)

## 2022-02-13 ENCOUNTER — Telehealth: Payer: Self-pay | Admitting: Family Medicine

## 2022-02-13 NOTE — Telephone Encounter (Signed)
Pt returning phone call ° °

## 2022-02-14 NOTE — Telephone Encounter (Signed)
Patient aware of imaging results  ?

## 2022-03-12 ENCOUNTER — Other Ambulatory Visit: Payer: Self-pay

## 2022-03-19 ENCOUNTER — Telehealth: Payer: Self-pay | Admitting: Family Medicine

## 2022-03-19 ENCOUNTER — Telehealth: Payer: Self-pay

## 2022-03-19 DIAGNOSIS — I10 Essential (primary) hypertension: Secondary | ICD-10-CM

## 2022-03-19 NOTE — Telephone Encounter (Signed)
Referred to thn to discuss assistance options

## 2022-03-19 NOTE — Telephone Encounter (Signed)
Referred to thn for med assistance

## 2022-03-19 NOTE — Telephone Encounter (Signed)
FYI Patient came in office to speak to [facility] the nurse.   Part D for medicine with is insurance and cut it off. Needs to speak to nurse has part D now.  Patient need refills and confused on his medicine.

## 2022-04-03 ENCOUNTER — Telehealth: Payer: Self-pay

## 2022-04-03 ENCOUNTER — Encounter: Payer: Self-pay | Admitting: Family Medicine

## 2022-04-03 ENCOUNTER — Ambulatory Visit (INDEPENDENT_AMBULATORY_CARE_PROVIDER_SITE_OTHER): Payer: Medicare HMO | Admitting: Family Medicine

## 2022-04-03 VITALS — BP 127/83 | HR 93 | Ht 67.0 in | Wt 155.0 lb

## 2022-04-03 DIAGNOSIS — Z596 Low income: Secondary | ICD-10-CM

## 2022-04-03 DIAGNOSIS — E785 Hyperlipidemia, unspecified: Secondary | ICD-10-CM

## 2022-04-03 DIAGNOSIS — I10 Essential (primary) hypertension: Secondary | ICD-10-CM

## 2022-04-03 DIAGNOSIS — E559 Vitamin D deficiency, unspecified: Secondary | ICD-10-CM

## 2022-04-03 DIAGNOSIS — R7303 Prediabetes: Secondary | ICD-10-CM

## 2022-04-03 DIAGNOSIS — R351 Nocturia: Secondary | ICD-10-CM | POA: Diagnosis not present

## 2022-04-03 DIAGNOSIS — M25551 Pain in right hip: Secondary | ICD-10-CM | POA: Diagnosis not present

## 2022-04-03 DIAGNOSIS — E039 Hypothyroidism, unspecified: Secondary | ICD-10-CM

## 2022-04-03 MED ORDER — TAMSULOSIN HCL 0.4 MG PO CAPS: 0.4000 mg | ORAL_CAPSULE | Freq: Every day | ORAL | 3 refills | Status: DC

## 2022-04-03 MED ORDER — AMLODIPINE BESYLATE 10 MG PO TABS: 10.0000 mg | ORAL_TABLET | Freq: Every day | ORAL | 3 refills | Status: DC

## 2022-04-03 NOTE — Assessment & Plan Note (Signed)
DASH diet and commitment to daily physical activity for a minimum of 30 minutes discussed and encouraged, as a part of hypertension management. The importance of attaining a healthy weight is also discussed.     04/03/2022    1:05 PM 02/07/2022    9:45 AM 10/03/2021    1:33 PM 10/03/2021    1:10 PM 04/04/2021    1:23 PM 04/04/2021    1:04 PM 10/04/2020    1:23 PM  BP/Weight  Systolic BP 497 026 378 588 502 774 128  Diastolic BP 83 92 84 85 84 83 82  Wt. (Lbs) 155 153  152  152.8   BMI 24.28 kg/m2 24.69 kg/m2  23.81 kg/m2  23.93 kg/m2    Controlled, no change in medication

## 2022-04-03 NOTE — Assessment & Plan Note (Addendum)
Trial of flomax, unable to obtain cialis at affordable cost

## 2022-04-03 NOTE — Assessment & Plan Note (Signed)
Controlled, no change in medication  

## 2022-04-03 NOTE — Progress Notes (Signed)
[facility])  [facility]    04/03/2022  [name] 1941/02/12 [id number]  Reason for referral: Medication Assistance with Cialis and Synthroid  Referral source: [facility] RN Current insurance: [employer]  Outreach: Unsuccessful telephone call with [name].   Brief Summary:  [name] received a referral from [facility] regarding medication assistance for Cialis and Synthroid as patient was getting them from the health department. This pharmacist called the patient on 03/22/2022 to inquire about medication assistance needs (for above medications) but patient denied assistance. He stated he paid $2 for Synthroid and Cialis. Pharmacist informed patient follow up was needed to clarify referral base on conflicting information and he should to expect receive another call shortly. This pharmacist has called patient three additional times and left a voicemail asking [name] to return my call.   Plan: Will close [facility] pharmacy case as pharmacist was unable to reach [name] at this time after three consecutive patient outreach attempts. Will be happy to assist in the future as needed.    Thank you for allowing pharmacy to be a part of this patient's care.  Kristeen Miss, PharmD Clinical Pharmacist [place] Cell: [phone]

## 2022-04-03 NOTE — Patient Instructions (Addendum)
Annual exam 12/22 or after  F/U in 4 months, call if you need me sooner  Fasting CBC, lipid, cmp and eGFR, hBA1C, tSH and vit D 5 days before October visit   New for frequent urination is daily flomax    You are referred to Orthopedics re right hip and leg pain,  Thanks for choosing [facility], we consider it a privelige to serve you.

## 2022-04-03 NOTE — Assessment & Plan Note (Addendum)
Persists, pathology in lumbar spine more impressive, however pt requests ortho eval

## 2022-04-03 NOTE — Assessment & Plan Note (Signed)
Hyperlipidemia:Low fat diet discussed and encouraged.   Lipid Panel  Lab Results  Component Value Date   CHOL 146 02/07/2022   HDL 70 02/07/2022   LDLCALC 63 02/07/2022   TRIG 66 02/07/2022   CHOLHDL 2.1 02/07/2022     Controlled, no change in medication

## 2022-04-03 NOTE — Progress Notes (Signed)
   [name]     MRN: [id number]      DOB: [birth year]   HPI [name] is here for follow up and re-evaluation of chronic medical conditions, medication management and review of any available recent lab and radiology data.  Preventive health is updated, specifically  Cancer screening and Immunization.   Still c/o right shank pain , worse when he just stands, rated at an 8 to 10. Past h/o injuries and states treatment he received not beneficial C/o nocturia approx 5 times / night, no rest ROS Denies recent fever or chills. Denies sinus pressure, nasal congestion, ear pain or sore throat. Denies chest congestion, productive cough or wheezing. Denies chest pains, palpitations and leg swelling Denies abdominal pain, nausea, vomiting,diarrhea or constipation.   Denies headaches, seizures, numbness, or tingling. Denies depression, anxiety or insomnia. Denies skin break down or rash.   PE  BP 127/83   Pulse 93   Ht '5\' 7"'$  (1.702 m)   Wt 155 lb (70.3 kg)   SpO2 96%   BMI 24.28 kg/m   Patient alert and oriented and in no cardiopulmonary distress.  HEENT: No facial asymmetry, EOMI,     Neck supple .  Chest: Clear to auscultation bilaterally.  CVS: S1, S2 no murmurs, no S3.Regular rate.  ABD: Soft non tender.   Ext: No edema  MS: Adequate though reduced ROM spine, shoulders, hips and knees.  Skin: Intact, no ulcerations or rash noted.  Psych: Good eye contact, normal affect. Memory intact not anxious or depressed appearing.  CNS: CN 2-12 intact, power,  normal throughout.no focal deficits noted.   Assessment & Plan  Right hip pain Persists, pathology in lumbar spine more impressive, however pt requests ortho eval  Nocturia more than twice per night Trial of flomax, unable to obtain cialis at affordable cost  Essential hypertension DASH diet and commitment to daily physical activity for a minimum of 30 minutes discussed and encouraged, as a part of  hypertension management. The importance of attaining a healthy weight is also discussed.     04/03/2022    1:05 PM 02/07/2022    9:45 AM 10/03/2021    1:33 PM 10/03/2021    1:10 PM 04/04/2021    1:23 PM 04/04/2021    1:04 PM 10/04/2020    1:23 PM  BP/Weight  Systolic BP 389 373 428 768 115 726 203  Diastolic BP 83 92 84 85 84 83 82  Wt. (Lbs) 155 153  152  152.8   BMI 24.28 kg/m2 24.69 kg/m2  23.81 kg/m2  23.93 kg/m2    Controlled, no change in medication     Hypothyroidism Controlled, no change in medication   Hyperlipidemia LDL goal <100 Hyperlipidemia:Low fat diet discussed and encouraged.   Lipid Panel  Lab Results  Component Value Date   CHOL 146 02/07/2022   HDL 70 02/07/2022   LDLCALC 63 02/07/2022   TRIG 66 02/07/2022   CHOLHDL 2.1 02/07/2022     Controlled, no change in medication

## 2022-04-04 ENCOUNTER — Other Ambulatory Visit (HOSPITAL_COMMUNITY)
Admission: RE | Admit: 2022-04-04 | Discharge: 2022-04-04 | Disposition: A | Discharge status: Home / Self Care | Payer: Medicare HMO | Attending: Cardiology | Admitting: Cardiology

## 2022-04-04 ENCOUNTER — Ambulatory Visit (INDEPENDENT_AMBULATORY_CARE_PROVIDER_SITE_OTHER): Payer: Medicare HMO

## 2022-04-04 ENCOUNTER — Other Ambulatory Visit: Payer: Self-pay | Admitting: Cardiology

## 2022-04-04 ENCOUNTER — Ambulatory Visit: Payer: Medicare HMO | Admitting: Cardiology

## 2022-04-04 ENCOUNTER — Encounter: Payer: Self-pay | Admitting: Cardiology

## 2022-04-04 VITALS — BP 118/70 | HR 80 | Ht 67.0 in | Wt 155.0 lb

## 2022-04-04 DIAGNOSIS — I493 Ventricular premature depolarization: Secondary | ICD-10-CM

## 2022-04-04 DIAGNOSIS — I10 Essential (primary) hypertension: Secondary | ICD-10-CM

## 2022-04-04 DIAGNOSIS — R9431 Abnormal electrocardiogram [ECG] [EKG]: Secondary | ICD-10-CM

## 2022-04-04 DIAGNOSIS — E785 Hyperlipidemia, unspecified: Secondary | ICD-10-CM | POA: Diagnosis not present

## 2022-04-04 DIAGNOSIS — R0602 Shortness of breath: Secondary | ICD-10-CM | POA: Diagnosis not present

## 2022-04-04 LAB — BASIC METABOLIC PANEL
Anion gap: 6 (ref 5–15)
BUN: 21 mg/dL (ref 8–23)
CO2: 31 mmol/L (ref 22–32)
Calcium: 9.7 mg/dL (ref 8.9–10.3)
Chloride: 105 mmol/L (ref 98–111)
Creatinine, Ser: 1.34 mg/dL — ABNORMAL HIGH (ref 0.61–1.24)
GFR, Estimated: 53 mL/min — ABNORMAL LOW (ref >60)
Glucose, Bld: 98 mg/dL (ref 70–99)
Potassium: 4 mmol/L (ref 3.5–5.1)
Sodium: 142 mmol/L (ref 135–145)

## 2022-04-04 LAB — MAGNESIUM: Magnesium: 2.3 mg/dL (ref 1.7–2.4)

## 2022-04-04 NOTE — Patient Instructions (Signed)
Medication Instructions:  Your physician recommends that you continue on your current medications as directed. Please refer to the Current Medication list given to you today.   Labwork: Bmet, Mag today at [facility]  Testing/Procedures: Your physician has requested that you have an echocardiogram. Echocardiography is a painless test that uses sound waves to create images of your heart. It provides your doctor with information about the size and shape of your heart and how well your heart's chambers and valves are working. This procedure takes approximately one hour. There are no restrictions for this procedure.   ZIO- Long Term Monitor Instructions   Your physician has requested you wear your ZIO patch monitor 3 days.   This is a single patch monitor.  Irhythm supplies one patch monitor per enrollment.  Additional stickers are not available.  Do not shower for the first 24 hours.  You may shower after the first 24 hours.   Press button if you feel a symptom. You will hear a small click.  Record Date, Time and Symptom in the Patient Log Book.   When you are ready to remove patch, follow instructions on last 2 pages of Patient Log Book.  Stick patch monitor onto last page of Patient Log Book.   Place Patient Log Book in [place] box.  Use locking tab on box and tape box closed securely.  The Orange and [employer] has [employer] on it.  Please place in mailbox as soon as possible.  Your physician should have your test results approximately 7 days after the monitor has been mailed back to [facility].   Call [place] at [phone] if you have questions regarding your ZIO XT patch monitor.  Call them immediately if you see an orange light blinking on your monitor.   If your monitor falls off in less than 4 days contact our Monitor department at [phone].  If your monitor becomes loose or falls off after 4 days call [employer] at [phone] for suggestions on  securing your monitor.   Follow-Up: Your physician recommends that you schedule a follow-up appointment in: 3 months  Any Other Special Instructions Will Be Listed Below (If Applicable).  If you need a refill on your cardiac medications before your next appointment, please call your pharmacy.

## 2022-04-04 NOTE — Progress Notes (Signed)
Cardiology Office Note:    Date:  04/04/2022   ID:  [name] 1940-12-12, MRN [id number]  PCP:  Fayrene Helper, MD  Cardiologist:  None  Electrophysiologist:  None   Referring MD: Fayrene Helper, MD   Chief Complaint  Patient presents with   Abnormal ECG    History of Present Illness:    [name] is a 81 y.o. male with a hx of hypertension, hyperlipidemia, hypothyroidism, prediabetes, prostate cancer who is referred by Dr. Moshe Cipro for evaluation of abnormal EKG.  EKG 02/07/2022 showed frequent PVCs, inferior Q waves, poor R wave progression, sinus rhythm, rate 92.  He denies any chest pain, lightheadedness, syncope, or palpitations.  Does report he has been feeling fatigued.  He does notice shortness of breath with significant exertion, particularly if he is lifting heavy objects.  He does not exercise regularly but is active.  Does report some intermittent swelling in left leg.  No smoking history.  No family history of heart disease.  Past Medical History:  Diagnosis Date   Allergic rhinitis, seasonal    BPH (benign prostatic hypertrophy)    Erectile dysfunction    History of kidney stones    Hx of thyroidectomy    Hyperlipidemia    Hypertension    Hypothyroidism    Pre-diabetes    Prostate cancer ([place]) 2011   treated with radiation   Urosepsis 07/2011   ICU admission [facility]    Past Surgical History:  Procedure Laterality Date   COLONOSCOPY N/A 09/03/2013   Procedure: COLONOSCOPY;  Surgeon: Rogene Houston, MD;  Location: [facility];  Service: Endoscopy;  Laterality: N/A;  1225   COLONOSCOPY N/A 11/08/2015   Procedure: COLONOSCOPY;  Surgeon: Rogene Houston, MD;  Location: [facility];  Service: Endoscopy;  Laterality: N/A;  1200   MASS EXCISION N/A 08/15/2017   Procedure: EXCISION SEBACEOUS CYST NECK;  Surgeon: Aviva Signs, MD;  Location: [facility] ORS;  Service: General;  Laterality: N/A;   PROSTATE SURGERY     THYROIDECTOMY  2003    transurethral resection of thr prostate  2003   ureteral stone extraction  08/2011    Current Medications: Current Meds  Medication Sig   amLODipine (NORVASC) 10 MG tablet Take 1 tablet (10 mg total) by mouth daily.   cetirizine (ZYRTEC) 10 MG tablet TAKE 1 TABLET EVERY DAY   EPINEPHrine 0.3 mg/0.3 mL IJ SOAJ injection Inject 0.3 mLs (0.3 mg total) into the muscle as needed for anaphylaxis.   hydrochlorothiazide (MICROZIDE) 12.5 MG capsule TAKE 1 CAPSULE EVERY DAY   levothyroxine (SYNTHROID) 75 MCG tablet Take 1 tablet (75 mcg total) by mouth daily.   lovastatin (MEVACOR) 40 MG tablet TAKE 1 TABLET EVERY DAY   Multiple Vitamin (MULTIVITAMIN WITH MINERALS) TABS tablet Take 1 tablet by mouth daily.   oxybutynin (DITROPAN) 5 MG tablet TAKE 1 TABLET EVERY DAY   predniSONE (DELTASONE) 10 MG tablet Take 1 tablet (10 mg total) by mouth 2 (two) times daily with a meal.   tadalafil (CIALIS) 5 MG tablet Take 1 tablet (5 mg total) by mouth daily.   tamsulosin (FLOMAX) 0.4 MG CAPS capsule Take 1 capsule (0.4 mg total) by mouth daily.     Allergies:   Other, Peanut-containing drug products, Eggs or egg-derived products, Iodine, Shellfish allergy, and Penicillins   Social History   Socioeconomic History   Marital status: Married    Spouse name: Not on file   Number of children: 5  Years of education: Not on file   Highest education level: Not on file  Occupational History   Occupation: retired  Tobacco Use   Smoking status: Never   Smokeless tobacco: Never  Vaping Use   Vaping Use: Never used  Substance and Sexual Activity   Alcohol use: No    Alcohol/week: 0.0 standard drinks of alcohol   Drug use: No   Sexual activity: Yes  Other Topics Concern   Not on file  Social History Narrative   Not on file   Social Determinants of Health   Financial Resource Strain: Medium Risk (11/12/2021)   Overall Financial Resource Strain (CARDIA)    Difficulty of Paying Living Expenses: Somewhat  hard  Food Insecurity: No Food Insecurity (06/13/2021)   Hunger Vital Sign    Worried About Running Out of Food in the Last Year: Never true    Ran Out of Food in the Last Year: Never true  Transportation Needs: No Transportation Needs (06/13/2021)   PRAPARE - Hydrologist (Medical): No    Lack of Transportation (Non-Medical): No  Physical Activity: Inactive (06/13/2021)   Exercise Vital Sign    Days of Exercise per Week: 0 days    Minutes of Exercise per Session: 0 min  Stress: No Stress Concern Present (06/13/2021)   [place]    Feeling of Stress : Not at all  Social Connections: Moderately Integrated (06/13/2021)   Social Connection and Isolation Panel [NHANES]    Frequency of Communication with Friends and Family: More than three times a week    Frequency of Social Gatherings with Friends and Family: More than three times a week    Attends Religious Services: More than 4 times per year    Active Member of [employer] or Organizations: No    Attends Music therapist: Never    Marital Status: Married     Family History: The patient's family history includes Diabetes in his brother, brother, and father; Gout in his brother; Hypertension in his father; Kidney disease in his brother; Stroke in his brother and mother.  ROS:   Please see the history of present illness.     All other systems reviewed and are negative.  EKGs/Labs/Other Studies Reviewed:    The following studies were reviewed today:   EKG:   02/07/22:  frequent PVCs, inferior Q waves, poor R wave progression, sinus rhythm, rate 92.  Recent Labs: 10/03/2021: Hemoglobin 14.8; Platelets 211 02/07/2022: ALT 11; TSH 3.780 04/04/2022: BUN 21; Creatinine, Ser 1.34; Magnesium 2.3; Potassium 4.0; Sodium 142  Recent Lipid Panel    Component Value Date/Time   CHOL 146 02/07/2022 1043   TRIG 66 02/07/2022 1043   HDL 70  02/07/2022 1043   CHOLHDL 2.1 02/07/2022 1043   CHOLHDL 2.2 04/03/2020 1122   VLDL 15 01/06/2017 1416   LDLCALC 63 02/07/2022 1043   LDLCALC 64 04/03/2020 1122    Physical Exam:    VS:  BP 118/70   Pulse 80   Ht '5\' 7"'$  (1.702 m)   Wt 155 lb (70.3 kg)   SpO2 98%   BMI 24.28 kg/m     Wt Readings from Last 3 Encounters:  04/04/22 155 lb (70.3 kg)  04/03/22 155 lb (70.3 kg)  02/07/22 153 lb (69.4 kg)     GEN:  Well nourished, well developed in no acute distress HEENT: Normal NECK: No JVD; No carotid bruits LYMPHATICS: No lymphadenopathy  CARDIAC: RRR, no murmurs, rubs, gallops RESPIRATORY:  Clear to auscultation without rales, wheezing or rhonchi  ABDOMEN: Soft, non-tender, non-distended MUSCULOSKELETAL:  No edema; No deformity  SKIN: Warm and dry NEUROLOGIC:  Alert and oriented x 3 PSYCHIATRIC:  Normal affect   ASSESSMENT:    1. SOB (shortness of breath)   2. Essential hypertension   3. Nonspecific abnormal electrocardiogram (ECG) (EKG)   4. PVC's (premature ventricular contractions)   5. Hyperlipidemia, unspecified hyperlipidemia type    PLAN:    Abnormal EKG: Inferior Q waves, poor R wave progression.  Reports shortness of breath.  Check echocardiogram  PVCs: Frequent PVCs noted on recent EKG.  Recommend Zio patch x3 days to quantify PVC burden.  Check echocardiogram as above.  Check electrolytes.  Recent normal TSH  Hypertension: On amlodipine 10 mg daily, HCTZ 12.5 mg daily.  Appears controlled.  Check BMET  Hyperlipidemia: On lovastatin 40 mg daily.  LDL 63 on 02/07/2022.   RTC in 3 months  Medication Adjustments/Labs and Tests Ordered: Current medicines are reviewed at length with the patient today.  Concerns regarding medicines are outlined above.  Orders Placed This Encounter  Procedures   Basic metabolic panel   Magnesium   ECHOCARDIOGRAM COMPLETE   No orders of the defined types were placed in this encounter.   Patient Instructions  Medication  Instructions:  Your physician recommends that you continue on your current medications as directed. Please refer to the Current Medication list given to you today.   Labwork: Bmet, Mag today at [facility]  Testing/Procedures: Your physician has requested that you have an echocardiogram. Echocardiography is a painless test that uses sound waves to create images of your heart. It provides your doctor with information about the size and shape of your heart and how well your heart's chambers and valves are working. This procedure takes approximately one hour. There are no restrictions for this procedure.   ZIO- Long Term Monitor Instructions   Your physician has requested you wear your ZIO patch monitor 3 days.   This is a single patch monitor.  [employer] supplies one patch monitor per enrollment.  Additional stickers are not available.  Do not shower for the first 24 hours.  You may shower after the first 24 hours.   Press button if you feel a symptom. You will hear a small click.  Record Date, Time and Symptom in the Patient Log Book.   When you are ready to remove patch, follow instructions on last 2 pages of Patient Log Book.  Stick patch monitor onto last page of Patient Log Book.   Place Patient Log Book in [place] box.  Use locking tab on box and tape box closed securely.  The [employer] and [employer] has [employer] on it.  Please place in mailbox as soon as possible.  Your physician should have your test results approximately 7 days after the monitor has been mailed back to [facility].   Call [place] at [phone] if you have questions regarding your ZIO XT patch monitor.  Call them immediately if you see an orange light blinking on your monitor.   If your monitor falls off in less than 4 days contact our Monitor department at [phone].  If your monitor becomes loose or falls off after 4 days call [employer] at [phone] for suggestions on securing your  monitor.   Follow-Up: Your physician recommends that you schedule a follow-up appointment in: 3 months  Any Other Special Instructions  Will Be Listed Below (If Applicable).  If you need a refill on your cardiac medications before your next appointment, please call your pharmacy.    Signed, Donato Heinz, MD  04/04/2022 11:06 PM    [place]

## 2022-04-08 ENCOUNTER — Encounter: Payer: Self-pay | Admitting: *Deleted

## 2022-04-12 DIAGNOSIS — I493 Ventricular premature depolarization: Secondary | ICD-10-CM | POA: Diagnosis not present

## 2022-04-18 ENCOUNTER — Ambulatory Visit (HOSPITAL_COMMUNITY)
Admission: RE | Admit: 2022-04-18 | Discharge: 2022-04-18 | Disposition: A | Discharge status: Home / Self Care | Payer: Medicare HMO | Source: Ambulatory Visit | Attending: Cardiology | Admitting: Cardiology

## 2022-04-18 DIAGNOSIS — R0602 Shortness of breath: Secondary | ICD-10-CM

## 2022-04-18 LAB — ECHOCARDIOGRAM COMPLETE
AR max vel: 3.83 cm2
AV Area VTI: 3.58 cm2
AV Area mean vel: 3.42 cm2
AV Mean grad: 2 mmHg
AV Peak grad: 3.4 mmHg
Ao pk vel: 0.92 m/s
Area-P 1/2: 2.33 cm2
MV VTI: 2.49 cm2
S' Lateral: 4.7 cm

## 2022-04-18 NOTE — Progress Notes (Signed)
*  PRELIMINARY RESULTS* Echocardiogram 2D Echocardiogram has been performed.  [name] 04/18/2022, 4:32 PM

## 2022-04-19 ENCOUNTER — Other Ambulatory Visit: Payer: Self-pay

## 2022-04-19 MED ORDER — METOPROLOL TARTRATE 25 MG PO TABS: 25.0000 mg | ORAL_TABLET | Freq: Two times a day (BID) | ORAL | 3 refills | Status: DC

## 2022-04-21 NOTE — Progress Notes (Unsigned)
Cardiology Office Note:    Date:  04/21/2022   ID:  [name], [name] 11-20-1940, MRN [id number]  PCP:  Kerri Perches, MD  Cardiologist:  None  Electrophysiologist:  None   Referring MD: Kerri Perches, MD   No chief complaint on file.   History of Present Illness:    [name] is a 81 y.o. male with a hx of hypertension, hyperlipidemia, hypothyroidism, prediabetes, prostate cancer who presents for follow-up.  He was referred by Dr. Lodema Hong for evaluation of abnormal EKG, initially seen on 04/06/2022.  EKG 02/07/2022 showed frequent PVCs, inferior Q waves, poor R wave progression, sinus rhythm, rate 92.  He denies any chest pain, lightheadedness, syncope, or palpitations.  Does report he has been feeling fatigued.  He does notice shortness of breath with significant exertion, particularly if he is lifting heavy objects.  He does not exercise regularly but is active.  Does report some intermittent swelling in left leg.  No smoking history.  No family history of heart disease.  Echocardiogram 04/18/2022 showed EF 25 to 30%, grade 1 diastolic dysfunction, normal RV function, no significant valvular disease.  Zio patch x4 days on 04/17/2022 showed 191 episodes of NSVT (longest lasting 7 beats), 8 episodes of SVT (longest lasting 14 seconds with rate 155 bpm), very frequent PVCs (24% of beats) and occasional ventricular couplets (3.5%).  Since last clinic visit,  Past Medical History:  Diagnosis Date   Allergic rhinitis, seasonal    BPH (benign prostatic hypertrophy)    Erectile dysfunction    History of kidney stones    Hx of thyroidectomy    Hyperlipidemia    Hypertension    Hypothyroidism    Pre-diabetes    Prostate cancer (HCC) 2011   treated with radiation   Urosepsis 07/2011   ICU admission [facility]    Past Surgical History:  Procedure Laterality Date   COLONOSCOPY N/A 09/03/2013   Procedure: COLONOSCOPY;  Surgeon: Malissa Hippo, MD;  [facility];  Service: Endoscopy;  Laterality: N/A;  1225   COLONOSCOPY N/A 11/08/2015   Procedure: COLONOSCOPY;  Surgeon: Malissa Hippo, MD;  Location: [facility];  Service: Endoscopy;  Laterality: N/A;  1200   MASS EXCISION N/A 08/15/2017   Procedure: EXCISION SEBACEOUS CYST NECK;  Surgeon: Franky Macho, MD;  Location: [facility] ORS;  Service: General;  Laterality: N/A;   PROSTATE SURGERY     THYROIDECTOMY  2003   transurethral resection of thr prostate  2003   ureteral stone extraction  08/2011    Current Medications: No outpatient medications have been marked as taking for the 04/24/22 encounter (Appointment) with Little Ishikawa, MD.     Allergies:   Other, Peanut-containing drug products, Eggs or egg-derived products, Iodine, Shellfish allergy, and Penicillins   Social History   Socioeconomic History   Marital status: Married    Spouse name: Not on file   Number of children: 5   Years of education: Not on file   Highest education level: Not on file  Occupational History   Occupation: retired  Tobacco Use   Smoking status: Never   Smokeless tobacco: Never  Vaping Use   Vaping Use: Never used  Substance and Sexual Activity   Alcohol use: No    Alcohol/week: 0.0 standard drinks of alcohol   Drug use: No   Sexual activity: Yes  Other Topics Concern   Not on file  Social History Narrative   Not on file  Social Determinants of Health   Financial Resource Strain: Medium Risk (11/12/2021)   Overall Financial Resource Strain (CARDIA)    Difficulty of Paying Living Expenses: Somewhat hard  Food Insecurity: No Food Insecurity (06/13/2021)   Hunger Vital Sign    Worried About Running Out of Food in the Last Year: Never true    Ran Out of Food in the Last Year: Never true  Transportation Needs: No Transportation Needs (06/13/2021)   PRAPARE - Administrator, Civil Service (Medical): No    Lack of Transportation (Non-Medical): No  Physical Activity: Inactive  (06/13/2021)   Exercise Vital Sign    Days of Exercise per Week: 0 days    Minutes of Exercise per Session: 0 min  Stress: No Stress Concern Present (06/13/2021)   [employer] of Occupational Health - Occupational Stress Questionnaire    Feeling of Stress : Not at all  Social Connections: Moderately Integrated (06/13/2021)   Social Connection and Isolation Panel [NHANES]    Frequency of Communication with Friends and Family: More than three times a week    Frequency of Social Gatherings with Friends and Family: More than three times a week    Attends Religious Services: More than 4 times per year    Active Member of [employer] or Organizations: No    Attends Engineer, structural: Never    Marital Status: Married     Family History: The patient's family history includes Diabetes in his brother, brother, and father; Gout in his brother; Hypertension in his father; Kidney disease in his brother; Stroke in his brother and mother.  ROS:   Please see the history of present illness.     All other systems reviewed and are negative.  EKGs/Labs/Other Studies Reviewed:    The following studies were reviewed today:   EKG:   02/07/22:  frequent PVCs, inferior Q waves, poor R wave progression, sinus rhythm, rate 92.  Recent Labs: 10/03/2021: Hemoglobin 14.8; Platelets 211 02/07/2022: ALT 11; TSH 3.780 04/04/2022: BUN 21; Creatinine, Ser 1.34; Magnesium 2.3; Potassium 4.0; Sodium 142  Recent Lipid Panel    Component Value Date/Time   CHOL 146 02/07/2022 1043   TRIG 66 02/07/2022 1043   HDL 70 02/07/2022 1043   CHOLHDL 2.1 02/07/2022 1043   CHOLHDL 2.2 04/03/2020 1122   VLDL 15 01/06/2017 1416   LDLCALC 63 02/07/2022 1043   LDLCALC 64 04/03/2020 1122    Physical Exam:    VS:  There were no vitals taken for this visit.    Wt Readings from Last 3 Encounters:  04/04/22 155 lb (70.3 kg)  04/03/22 155 lb (70.3 kg)  02/07/22 153 lb (69.4 kg)     GEN:  Well nourished, well  developed in no acute distress HEENT: Normal NECK: No JVD; No carotid bruits LYMPHATICS: No lymphadenopathy CARDIAC: RRR, no murmurs, rubs, gallops RESPIRATORY:  Clear to auscultation without rales, wheezing or rhonchi  ABDOMEN: Soft, non-tender, non-distended MUSCULOSKELETAL:  No edema; No deformity  SKIN: Warm and dry NEUROLOGIC:  Alert and oriented x 3 PSYCHIATRIC:  Normal affect   ASSESSMENT:    No diagnosis found.  PLAN:    Acute combined heart failure: Echocardiogram 04/18/2022 showed EF 25 to 30%, grade 1 diastolic dysfunction, normal RV function, no significant valvular disease.  Zio patch x4 days on 04/17/2022 showed 191 episodes of NSVT (longest lasting 7 beats), 8 episodes of SVT (longest lasting 14 seconds with rate 155 bpm), very frequent PVCs (24% of beats) and  occasional ventricular couplets (3.5%). -Recommend LHC/RHC for further evaluation -If cath unremarkable, will plan cardiac MRI -Started metoprolol 25 mg twice daily, will consolidate to Toprol-XL -***Stop amlodipine and HCTZ, switch to Entresto  Frequent PVCs: Zio patch x4 days on 04/17/2022 showed 191 episodes of NSVT (longest lasting 7 beats), 8 episodes of SVT (longest lasting 14 seconds with rate 155 bpm), very frequent PVCs (24% of beats) and occasional ventricular couplets (3.5%).  Hypertension: On amlodipine 10 mg daily, HCTZ 12.5 mg daily.  Plan to switch to Toprol-XL and Entresto as above  Hyperlipidemia: On lovastatin 40 mg daily.  LDL 63 on 02/07/2022.   RTC in***  Medication Adjustments/Labs and Tests Ordered: Current medicines are reviewed at length with the patient today.  Concerns regarding medicines are outlined above.  No orders of the defined types were placed in this encounter.  No orders of the defined types were placed in this encounter.   There are no Patient Instructions on file for this visit.   Signed, Little Ishikawa, MD  04/21/2022 5:15 PM    [facility]  [facility]

## 2022-04-21 NOTE — H&P (View-Only) (Signed)
Cardiology Office Note:    Date:  04/24/2022   ID:  Paul, Cooper 1941/08/08, MRN [id number]  PCP:  Fayrene Helper, MD  Cardiologist:  None  Electrophysiologist:  None   Referring MD: Fayrene Helper, MD   Chief Complaint  Patient presents with   Congestive Heart Failure    History of Present Illness:    [name] is a 81 y.o. male with a hx of heart failure, hypertension, hyperlipidemia, hypothyroidism, prediabetes, prostate cancer who presents for follow-up.  He was referred by Dr. Moshe Cipro for evaluation of abnormal EKG, initially seen on 04/06/2022.  EKG 02/07/2022 showed frequent PVCs, inferior Q waves, poor R wave progression, sinus rhythm, rate 92.  He denies any chest pain, lightheadedness, syncope, or palpitations.  Does report he has been feeling fatigued.  He does notice shortness of breath with significant exertion, particularly if he is lifting heavy objects.  He does not exercise regularly but is active.  Does report some intermittent swelling in left leg.  No smoking history.  No family history of heart disease.  Echocardiogram 04/18/2022 showed EF 25 to 02%, grade 1 diastolic dysfunction, normal RV function, no significant valvular disease.  Zio patch x4 days on 04/17/2022 showed 191 episodes of NSVT (longest lasting 7 beats), 8 episodes of SVT (longest lasting 14 seconds with rate 155 bpm), very frequent PVCs (24% of beats) and occasional ventricular couplets (3.5%).  Since last clinic visit, he reports that he is doing okay.  Denies any chest pain, dyspnea, lightheadedness, syncope, or palpitations.  Reports he tires out easily.  Reports left leg swelling.   Past Medical History:  Diagnosis Date   Allergic rhinitis, seasonal    BPH (benign prostatic hypertrophy)    Erectile dysfunction    History of kidney stones    Hx of thyroidectomy    Hyperlipidemia    Hypertension    Hypothyroidism    Pre-diabetes    Prostate cancer ([name]) 2011    treated with radiation   Urosepsis 07/2011   ICU admission [facility]    Past Surgical History:  Procedure Laterality Date   COLONOSCOPY N/A 09/03/2013   Procedure: COLONOSCOPY;  Surgeon: Rogene Houston, MD;  [facility];  Service: Endoscopy;  Laterality: N/A;  1225   COLONOSCOPY N/A 11/08/2015   Procedure: COLONOSCOPY;  Surgeon: Rogene Houston, MD;  Location: [facility];  Service: Endoscopy;  Laterality: N/A;  1200   MASS EXCISION N/A 08/15/2017   Procedure: EXCISION SEBACEOUS CYST NECK;  Surgeon: Aviva Signs, MD;  Location: [facility] ORS;  Service: General;  Laterality: N/A;   PROSTATE SURGERY     THYROIDECTOMY  2003   transurethral resection of thr prostate  2003   ureteral stone extraction  08/2011    Current Medications: Current Meds  Medication Sig   aspirin EC 81 MG tablet Take 1 tablet (81 mg total) by mouth daily.   cetirizine (ZYRTEC) 10 MG tablet TAKE 1 TABLET EVERY DAY   dapagliflozin propanediol (FARXIGA) 10 MG TABS tablet Take 1 tablet (10 mg total) by mouth daily before breakfast.   EPINEPHrine 0.3 mg/0.3 mL IJ SOAJ injection Inject 0.3 mLs (0.3 mg total) into the muscle as needed for anaphylaxis.   levothyroxine (SYNTHROID) 75 MCG tablet Take 1 tablet (75 mcg total) by mouth daily.   lovastatin (MEVACOR) 40 MG tablet TAKE 1 TABLET EVERY DAY   metoprolol succinate (TOPROL XL) 50 MG 24 hr tablet Take 1 tablet (50 mg total)  by mouth daily. Take with or immediately following a meal.   Multiple Vitamin (MULTIVITAMIN WITH MINERALS) TABS tablet Take 1 tablet by mouth daily.   oxybutynin (DITROPAN) 5 MG tablet TAKE 1 TABLET EVERY DAY   predniSONE (DELTASONE) 10 MG tablet Take 1 tablet (10 mg total) by mouth 2 (two) times daily with a meal.   predniSONE (DELTASONE) 50 MG tablet Take 1 tablet 13 hours prior to procedure, 1 tablet 7 hours prior to procedure, and 1 tablet (with Benedryl 50 mg) prior to going to the hospital for procedure   sacubitril-valsartan (ENTRESTO) 24-26  MG Take 1 tablet by mouth 2 (two) times daily.   tadalafil (CIALIS) 5 MG tablet Take 1 tablet (5 mg total) by mouth daily.   tamsulosin (FLOMAX) 0.4 MG CAPS capsule Take 1 capsule (0.4 mg total) by mouth daily.   [DISCONTINUED] amLODipine (NORVASC) 10 MG tablet TAKE 1 TABLET EVERY DAY   [DISCONTINUED] hydrochlorothiazide (MICROZIDE) 12.5 MG capsule Take 1 capsule (12.5 mg total) by mouth daily.   [DISCONTINUED] metoprolol tartrate (LOPRESSOR) 25 MG tablet Take 1 tablet (25 mg total) by mouth 2 (two) times daily.     Allergies:   Other, Peanut-containing drug products, Eggs or egg-derived products, Iodine, Shellfish allergy, and Penicillins   Social History   Socioeconomic History   Marital status: Married    Spouse name: Not on file   Number of children: 5   Years of education: Not on file   Highest education level: Not on file  Occupational History   Occupation: retired  Tobacco Use   Smoking status: Never   Smokeless tobacco: Never  Vaping Use   Vaping Use: Never used  Substance and Sexual Activity   Alcohol use: No    Alcohol/week: 0.0 standard drinks of alcohol   Drug use: No   Sexual activity: Yes  Other Topics Concern   Not on file  Social History Narrative   Not on file   Social Determinants of Health   Financial Resource Strain: Medium Risk (11/12/2021)   Overall Financial Resource Strain (CARDIA)    Difficulty of Paying Living Expenses: Somewhat hard  Food Insecurity: No Food Insecurity (06/13/2021)   Hunger Vital Sign    Worried About Running Out of Food in the Last Year: Never true    Ran Out of Food in the Last Year: Never true  Transportation Needs: No Transportation Needs (06/13/2021)   PRAPARE - Hydrologist (Medical): No    Lack of Transportation (Non-Medical): No  Physical Activity: Inactive (06/13/2021)   Exercise Vital Sign    Days of Exercise per Week: 0 days    Minutes of Exercise per Session: 0 min  Stress: No Stress  Concern Present (06/13/2021)   [place]    Feeling of Stress : Not at all  Social Connections: Moderately Integrated (06/13/2021)   Social Connection and Isolation Panel [NHANES]    Frequency of Communication with Friends and Family: More than three times a week    Frequency of Social Gatherings with Friends and Family: More than three times a week    Attends Religious Services: More than 4 times per year    Active Member of [employer] or Organizations: No    Attends Music therapist: Never    Marital Status: Married     Family History: The patient's family history includes Diabetes in his brother, brother, and father; Gout in his brother; Hypertension  in his father; Kidney disease in his brother; Stroke in his brother and mother.  ROS:   Please see the history of present illness.     All other systems reviewed and are negative.  EKGs/Labs/Other Studies Reviewed:    The following studies were reviewed today:   EKG:   04/24/22: Sinus rhythm, rate 84, frequent PVCs, inferior Q waves, poor R wave progression, left axis deviation 02/07/22:  frequent PVCs, inferior Q waves, poor R wave progression, sinus rhythm, rate 92.  Recent Labs: 10/03/2021: Hemoglobin 14.8; Platelets 211 02/07/2022: ALT 11; TSH 3.780 04/04/2022: BUN 21; Creatinine, Ser 1.34; Magnesium 2.3; Potassium 4.0; Sodium 142  Recent Lipid Panel    Component Value Date/Time   CHOL 146 02/07/2022 1043   TRIG 66 02/07/2022 1043   HDL 70 02/07/2022 1043   CHOLHDL 2.1 02/07/2022 1043   CHOLHDL 2.2 04/03/2020 1122   VLDL 15 01/06/2017 1416   LDLCALC 63 02/07/2022 1043   LDLCALC 64 04/03/2020 1122    Physical Exam:    VS:  BP 130/80   Pulse 84   Ht '5\' 7"'$  (1.702 m)   Wt 153 lb 6.4 oz (69.6 kg)   SpO2 100%   BMI 24.03 kg/m     Wt Readings from Last 3 Encounters:  04/24/22 153 lb 6.4 oz (69.6 kg)  04/04/22 155 lb (70.3 kg)  04/03/22 155 lb  (70.3 kg)     GEN:   in no acute distress HEENT: Normal NECK: No JVD; No carotid bruits CARDIAC: RRR, no murmurs, rubs, gallops RESPIRATORY:  Clear to auscultation without rales, wheezing or rhonchi  ABDOMEN: Soft, non-tender, non-distended MUSCULOSKELETAL:  1+ LLE edema SKIN: Warm and dry NEUROLOGIC:  Alert and oriented x 3 PSYCHIATRIC:  Normal affect   ASSESSMENT:    1. Acute combined systolic and diastolic heart failure ([place])   2. Frequent PVCs   3. Essential hypertension   4. Hyperlipidemia, unspecified hyperlipidemia type   5. Edema of left lower extremity     PLAN:    Acute combined heart failure: Echocardiogram 04/18/2022 showed EF 25 to 35%, grade 1 diastolic dysfunction, normal RV function, no significant valvular disease.  Unclear etiology, will need to rule out ischemia.  If ischemic evaluation unremarkable, could be secondary to frequent PVCs (24% burden on Zio 04/17/22) -Recommend LHC/RHC for further evaluation, scheduled 7/18 with Dr Martinique.  Risks and benefits of cardiac catheterization have been discussed with the patient.  These include bleeding, infection, kidney damage, stroke, heart attack, death.  The patient understands these risks and is willing to proceed. -If cath unremarkable, will plan cardiac MRI -Started metoprolol 25 mg twice daily, will consolidate to Toprol-XL 50 mg daily -Stop amlodipine and HCTZ to allow room to titrate GDMT.  Will start Entresto 24-26 mg twice daily and Farxiga 10 mg daily.  Check BMET  Frequent PVCs: Zio patch x4 days on 04/17/2022 showed 191 episodes of NSVT (longest lasting 7 beats), 8 episodes of SVT (longest lasting 14 seconds with rate 155 bpm), very frequent PVCs (24% of beats) and occasional ventricular couplets (3.5%). -Started Lopressor, will consolidate to Toprol-XL 50 mg daily as above  Hypertension: On amlodipine 10 mg daily, HCTZ 12.5 mg daily.  Plan to switch to Toprol-XL and Entresto as above  Hyperlipidemia: On  lovastatin 40 mg daily.  LDL 63 on 02/07/2022.  Left lower extremity edema: Asymmetric left lower extremity swelling, will check lower extremity duplex to evaluate for DVT.   RTC in 2 weeks  Medication  Adjustments/Labs and Tests Ordered: Current medicines are reviewed at length with the patient today.  Concerns regarding medicines are outlined above.  Orders Placed This Encounter  Procedures   CBC   Comprehensive metabolic panel   EKG 38-[id number]   VAS [place] LOWER EXTREMITY VENOUS (DVT)   Meds ordered this encounter  Medications   predniSONE (DELTASONE) 50 MG tablet    Sig: Take 1 tablet 13 hours prior to procedure, 1 tablet 7 hours prior to procedure, and 1 tablet (with Benedryl 50 mg) prior to going to the hospital for procedure    Dispense:  3 tablet    Refill:  0   metoprolol succinate (TOPROL XL) 50 MG 24 hr tablet    Sig: Take 1 tablet (50 mg total) by mouth daily. Take with or immediately following a meal.    Dispense:  90 tablet    Refill:  3    STOP lopressor   sacubitril-valsartan (ENTRESTO) 24-26 MG    Sig: Take 1 tablet by mouth 2 (two) times daily.    Dispense:  60 tablet    Refill:  3    STOP amlodipine   dapagliflozin propanediol (FARXIGA) 10 MG TABS tablet    Sig: Take 1 tablet (10 mg total) by mouth daily before breakfast.    Dispense:  30 tablet    Refill:  3    STOP HCTZ    Patient Instructions  Medication Instructions:  STOP metoprolol tartrate (Lopressor) START metoprolol succinate (Toprol XL) 50 mg daily  STOP amlodipine START Entresto 24/26 mg two times daily  STOP hydrochlorothiazide (HCTZ) START Farxiga 10 mg daily  *If you need a refill on your cardiac medications before your next appointment, please call your pharmacy*   Lab Work: CMET, CBC today  If you have labs (blood work) drawn today and your tests are completely normal, you will receive your results only by: MyChart Message (if you have MyChart) OR A paper copy in the mail If you  have any lab test that is abnormal or we need to change your treatment, we will call you to review the results.   Testing/Procedures: LLE ultrasound asap-r/u DVT  Your physician has requested that you have a cardiac catheterization. Cardiac catheterization is used to diagnose and/or treat various heart conditions. Doctors may recommend this procedure for a number of different reasons. The most common reason is to evaluate chest pain. Chest pain can be a symptom of coronary artery disease (CAD), and cardiac catheterization can show whether plaque is narrowing or blocking your heart's arteries. This procedure is also used to evaluate the valves, as well as measure the blood flow and oxygen levels in different parts of your heart. For further information please visit [email or web address].[email or web address]. Please follow instruction sheet, as given.  Follow-Up: At [facility], you and your health needs are our priority.  As part of our continuing mission to provide you with exceptional heart care, we have created designated Provider Care Teams.  These Care Teams include your primary Cardiologist (physician) and Advanced Practice Providers (APPs -  Physician Assistants and Nurse Practitioners) who all work together to provide you with the care you need, when you need it.  We recommend signing up for the patient portal called "MyChart".  Sign up information is provided on this After Visit Summary.  MyChart is used to connect with patients for Virtual Visits (Telemedicine).  Patients are able to view lab/test results, encounter notes, upcoming appointments, etc.  Non-urgent messages can be sent to your  provider as well.   To learn more about what you can do with MyChart, go to [email or web address].    Your next appointment:   7/27 at 2:20 pm with Dr. Gardiner Rhyme in [place]  [id number] Dept: [phone] Loc: [place]  04/24/2022  You are scheduled for a Cardiac Catheterization on Tuesday, July 18 with Dr. Peter Martinique.  1. Please arrive at the Main Entrance A at [facility]: [place], [place] [address] at 8:30 AM (This time is two hours before your procedure to ensure your preparation). Free valet parking service is available.   Special note: Every effort is made to have your procedure done on time. Please understand that emergencies sometimes delay scheduled procedures.  2. Diet: Do not eat solid foods after midnight.  You may have clear liquids until 5 AM upon the day of the procedure.  3. Labs: Today in office  4. Medication instructions in preparation for your procedure:   Contrast Allergy: Yes, Please take Prednisone '50mg'$  by mouth at: Thirteen hours prior to cath-9:30 pm on Monday 2. Seven hours prior to cath-3:30 AM Tuesday 3. And prior to leaving home please take last dose of Prednisone '50mg'$  and Benadryl '50mg'$  by mouth.  Hold Entresto day before and day of procedure  Hold farxiga morning of procedure   On the morning of your procedure, take Aspirin and any morning medicines NOT listed above.  You may use sips of water.  5. Plan to go home the same day, you will only stay overnight if medically necessary. 6. You MUST have a responsible adult to drive you home. 7. An adult MUST be with you the first 24 hours after you arrive home. 8. Bring a current list of your medications, and the last time and date medication taken. 9. Bring ID and current insurance cards. 10.Please wear clothes that are easy to get on and off and wear slip-on shoes.  Thank you for allowing [place] to care for you!   -- [facility] Invasive Cardiovascular services    Signed, Donato Heinz, MD  04/24/2022 10:37 AM    [place]

## 2022-04-23 ENCOUNTER — Other Ambulatory Visit: Payer: Self-pay

## 2022-04-23 ENCOUNTER — Telehealth: Payer: Self-pay | Admitting: Family Medicine

## 2022-04-23 ENCOUNTER — Other Ambulatory Visit: Payer: Self-pay | Admitting: Family Medicine

## 2022-04-23 DIAGNOSIS — N39498 Other specified urinary incontinence: Secondary | ICD-10-CM

## 2022-04-23 MED ORDER — HYDROCHLOROTHIAZIDE 12.5 MG PO CAPS: 12.5000 mg | ORAL_CAPSULE | Freq: Every day | ORAL | 0 refills | Status: DC

## 2022-04-23 NOTE — Telephone Encounter (Signed)
[name] with [facility] called in on patient behalf.  Patient needs short supply of hydrochlorothiazide (MICROZIDE) 12.5 MG capsule Sent in to [facility] is processing order but patient is completely out of med.

## 2022-04-23 NOTE — Telephone Encounter (Signed)
Bridge supply sent in

## 2022-04-24 ENCOUNTER — Ambulatory Visit (HOSPITAL_COMMUNITY)
Admission: RE | Admit: 2022-04-24 | Discharge: 2022-04-24 | Disposition: A | Discharge status: Home / Self Care | Payer: Medicare HMO | Source: Ambulatory Visit | Attending: Cardiovascular Disease | Admitting: Cardiovascular Disease

## 2022-04-24 ENCOUNTER — Ambulatory Visit: Payer: Medicare HMO | Admitting: Cardiology

## 2022-04-24 ENCOUNTER — Encounter: Payer: Self-pay | Admitting: Cardiology

## 2022-04-24 VITALS — BP 130/80 | HR 84 | Ht 67.0 in | Wt 153.4 lb

## 2022-04-24 DIAGNOSIS — I5041 Acute combined systolic (congestive) and diastolic (congestive) heart failure: Secondary | ICD-10-CM

## 2022-04-24 DIAGNOSIS — I10 Essential (primary) hypertension: Secondary | ICD-10-CM | POA: Diagnosis not present

## 2022-04-24 DIAGNOSIS — I493 Ventricular premature depolarization: Secondary | ICD-10-CM

## 2022-04-24 DIAGNOSIS — R600 Localized edema: Secondary | ICD-10-CM | POA: Insufficient documentation

## 2022-04-24 DIAGNOSIS — E785 Hyperlipidemia, unspecified: Secondary | ICD-10-CM | POA: Diagnosis not present

## 2022-04-24 MED ORDER — DAPAGLIFLOZIN PROPANEDIOL 10 MG PO TABS: 10.0000 mg | ORAL_TABLET | Freq: Every day | ORAL | 3 refills | Status: DC

## 2022-04-24 MED ORDER — METOPROLOL SUCCINATE ER 50 MG PO TB24: 50.0000 mg | ORAL_TABLET | Freq: Every day | ORAL | 3 refills | Status: DC

## 2022-04-24 MED ORDER — PREDNISONE 50 MG PO TABS: ORAL_TABLET | ORAL | 0 refills | Status: DC

## 2022-04-24 MED ORDER — ENTRESTO 24-26 MG PO TABS: 1.0000 | ORAL_TABLET | Freq: Two times a day (BID) | ORAL | 3 refills | Status: DC

## 2022-04-24 NOTE — Patient Instructions (Addendum)
Medication Instructions:  STOP metoprolol tartrate (Lopressor) START metoprolol succinate (Toprol XL) 50 mg daily  STOP amlodipine START Entresto 24/26 mg two times daily  STOP hydrochlorothiazide (HCTZ) START Farxiga 10 mg daily  *If you need a refill on your cardiac medications before your next appointment, please call your pharmacy*   Lab Work: CMET, CBC today  If you have labs (blood work) drawn today and your tests are completely normal, you will receive your results only by: [place] (if you have MyChart) OR A paper copy in the mail If you have any lab test that is abnormal or we need to change your treatment, we will call you to review the results.   Testing/Procedures: LLE ultrasound asap-r/u DVT  Your physician has requested that you have a cardiac catheterization. Cardiac catheterization is used to diagnose and/or treat various heart conditions. Doctors may recommend this procedure for a number of different reasons. The most common reason is to evaluate chest pain. Chest pain can be a symptom of coronary artery disease (CAD), and cardiac catheterization can show whether plaque is narrowing or blocking your heart's arteries. This procedure is also used to evaluate the valves, as well as measure the blood flow and oxygen levels in different parts of your heart. For further information please visit [email or web address]. Please follow instruction sheet, as given.  Follow-Up: At [facility], you and your health needs are our priority.  As part of our continuing mission to provide you with exceptional heart care, we have created designated Provider Care Teams.  These Care Teams include your primary Cardiologist (physician) and Advanced Practice Providers (APPs -  Physician Assistants and Nurse Practitioners) who all work together to provide you with the care you need, when you need it.  We recommend signing up for the patient portal called "MyChart".  Sign up information is  provided on this After Visit Summary.  MyChart is used to connect with patients for Virtual Visits (Telemedicine).  Patients are able to view lab/test results, encounter notes, upcoming appointments, etc.  Non-urgent messages can be sent to your provider as well.   To learn more about what you can do with MyChart, go to [email or web address].    Your next appointment:   7/27 at 2:20 pm with Dr. Gardiner Rhyme in [place] [address] Dept: [phone] Loc: [place]  04/24/2022  You are scheduled for a Cardiac Catheterization on Tuesday, July 18 with Dr. Peter Martinique.  1. Please arrive at the Main Entrance A at [facility]: [place], [place] [address] at 8:30 AM (This time is two hours before your procedure to ensure your preparation). Free valet parking service is available.   Special note: Every effort is made to have your procedure done on time. Please understand that emergencies sometimes delay scheduled procedures.  2. Diet: Do not eat solid foods after midnight.  You may have clear liquids until 5 AM upon the day of the procedure.  3. Labs: Today in office  4. Medication instructions in preparation for your procedure:   Contrast Allergy: Yes, Please take Prednisone '50mg'$  by mouth at: Thirteen hours prior to cath-9:30 pm on Monday 2. Seven hours prior to cath-3:30 AM Tuesday 3. And prior to leaving home please take last dose of Prednisone '50mg'$  and Benadryl '50mg'$  by mouth.  Hold Entresto day before and day of procedure  Hold  farxiga morning of procedure   On the morning of your procedure, take Aspirin and any morning medicines NOT listed above.  You may use sips of water.  5. Plan to go home the same day, you will only stay overnight if medically necessary. 6. You MUST have a responsible adult to drive you  home. 7. An adult MUST be with you the first 24 hours after you arrive home. 8. Bring a current list of your medications, and the last time and date medication taken. 9. Bring ID and current insurance cards. 10.Please wear clothes that are easy to get on and off and wear slip-on shoes.  Thank you for allowing [place] to care for you!   --  Invasive Cardiovascular services

## 2022-04-25 ENCOUNTER — Telehealth: Payer: Self-pay | Admitting: Cardiology

## 2022-04-25 DIAGNOSIS — N289 Disorder of kidney and ureter, unspecified: Secondary | ICD-10-CM

## 2022-04-25 DIAGNOSIS — I5041 Acute combined systolic (congestive) and diastolic (congestive) heart failure: Secondary | ICD-10-CM

## 2022-04-25 LAB — COMPREHENSIVE METABOLIC PANEL
ALT: 6 IU/L (ref 0–44)
AST: 15 IU/L (ref 0–40)
Albumin/Globulin Ratio: 1.8 (ref 1.2–2.2)
Albumin: 4.3 g/dL (ref 3.7–4.7)
Alkaline Phosphatase: 63 IU/L (ref 44–121)
BUN/Creatinine Ratio: 13 (ref 10–24)
BUN: 18 mg/dL (ref 8–27)
Bilirubin Total: 1 mg/dL (ref 0.0–1.2)
CO2: 27 mmol/L (ref 20–29)
Calcium: 9.4 mg/dL (ref 8.6–10.2)
Chloride: 103 mmol/L (ref 96–106)
Creatinine, Ser: 1.41 mg/dL — ABNORMAL HIGH (ref 0.76–1.27)
Globulin, Total: 2.4 g/dL (ref 1.5–4.5)
Glucose: 108 mg/dL — ABNORMAL HIGH (ref 70–99)
Potassium: 4.2 mmol/L (ref 3.5–5.2)
Sodium: 142 mmol/L (ref 134–144)
Total Protein: 6.7 g/dL (ref 6.0–8.5)
eGFR: 49 mL/min/{1.73_m2} — ABNORMAL LOW (ref >59)

## 2022-04-25 LAB — CBC
Hematocrit: 46.3 % (ref 37.5–51.0)
Hemoglobin: 15.2 g/dL (ref 13.0–17.7)
MCH: 27.8 pg (ref 26.6–33.0)
MCHC: 32.8 g/dL (ref 31.5–35.7)
MCV: 85 fL (ref 79–97)
Platelets: 201 10*3/uL (ref 150–450)
RBC: 5.46 x10E6/uL (ref 4.14–5.80)
RDW: 13.5 % (ref 11.6–15.4)
WBC: 4.1 10*3/uL (ref 3.4–10.8)

## 2022-04-25 NOTE — Telephone Encounter (Signed)
Called pt to let him know I have printed out the patient assistance form for both medications and will leave them at the front desk here at the [facility] office. No answer at this time, left a message for him.

## 2022-04-25 NOTE — Telephone Encounter (Signed)
Forms filled out, need provider signature and pt's portion to be completed. Forms placed at front desk.

## 2022-04-25 NOTE — Telephone Encounter (Signed)
New Message:      Vaughan Basta from [facility] called. She said patient can not affords the co-payment for his [place] and Delene Loll. Vaughan Basta said to please give her a call. She says she will give you the details.

## 2022-04-25 NOTE — Telephone Encounter (Signed)
[name], she states this pt can not afford both medications. She was able to give him a free 30 day supply for Entresto. I printed out applications for [place]. Will call pt so come pick up the forms to complete.

## 2022-04-26 NOTE — Telephone Encounter (Signed)
Spoke to patient-patient assistance forms mailed to patient as he lives in [place] and has transportation concerns.    Also discussed lab results-patient has not started new medication yet as he is concerned about cost.  Advised to start as instructed using samples provided (also has 30 day trial card) and complete patient assistance.  If denied, will discuss alternatives at that time.   He is aware lab work needed Monday (can have at [facility]).   Patient verbalized understanding.  No further questions at this time.   Advised to call back with any additional questions or concerns.

## 2022-04-29 ENCOUNTER — Other Ambulatory Visit (HOSPITAL_COMMUNITY)
Admission: RE | Admit: 2022-04-29 | Discharge: 2022-04-29 | Disposition: A | Discharge status: Home / Self Care | Payer: Medicare HMO | Source: Ambulatory Visit | Attending: Cardiology | Admitting: Cardiology

## 2022-04-29 ENCOUNTER — Telehealth: Payer: Self-pay | Admitting: *Deleted

## 2022-04-29 DIAGNOSIS — I5041 Acute combined systolic (congestive) and diastolic (congestive) heart failure: Secondary | ICD-10-CM | POA: Diagnosis not present

## 2022-04-29 DIAGNOSIS — N289 Disorder of kidney and ureter, unspecified: Secondary | ICD-10-CM | POA: Insufficient documentation

## 2022-04-29 LAB — BASIC METABOLIC PANEL
Anion gap: 6 (ref 5–15)
BUN: 15 mg/dL (ref 8–23)
CO2: 30 mmol/L (ref 22–32)
Calcium: 9 mg/dL (ref 8.9–10.3)
Chloride: 107 mmol/L (ref 98–111)
Creatinine, Ser: 1.23 mg/dL (ref 0.61–1.24)
GFR, Estimated: 58 mL/min — ABNORMAL LOW (ref >60)
Glucose, Bld: 99 mg/dL (ref 70–99)
Potassium: 3.7 mmol/L (ref 3.5–5.1)
Sodium: 143 mmol/L (ref 135–145)

## 2022-04-29 NOTE — Telephone Encounter (Addendum)
Cardiac Catheterization scheduled at [facility] for: Tuesday April 30, 2022 10:30 AM Arrival time and place: [place] Entrance A at: 8:30 AM   Nothing to eat after midnight prior to procedure, clear liquids until 5 AM day of procedure.  CONTRAST ALLERGY: 13 hour Prednisone and Benadryl Prep: 04/29/22 Prednisone 50 mg 9:30 PM 04/30/22 Prednisone 50 mg 3:30 AM 04/30/22 Prednisone 50 mg and Benadryl 50 mg just prior to leaving home for hospital  Medication instructions: -Hold:  Farxiga-AM of procedure  Entresto-day before and day of procedure -per protocol  -Except hold medications usual morning medications can be taken with sips of water including aspirin 81 mg.  Confirmed patient has responsible adult to drive home post procedure and be with patient first 24 hours after arriving home.  Patient reports no new symptoms concerning for COVID-19 in the past 10 days.  Reviewed procedure instructions with patient.

## 2022-04-29 NOTE — Telephone Encounter (Signed)
Left message for patient to call back to review procedure instructions 

## 2022-04-29 NOTE — Telephone Encounter (Signed)
Patient was calling back. Please advise  

## 2022-04-29 NOTE — Telephone Encounter (Signed)
Left message to call back  Patient assistant forms mailed last week to be completed.  Samples provided at [place] 7/12 (and free 30 day card for [facility]).

## 2022-04-29 NOTE — Telephone Encounter (Signed)
Call placed to patient to review procedure instructions, unable to reach patient at either phone number listed.

## 2022-04-30 ENCOUNTER — Other Ambulatory Visit: Payer: Self-pay

## 2022-04-30 ENCOUNTER — Ambulatory Visit (HOSPITAL_COMMUNITY)
Admission: RE | Admit: 2022-04-30 | Discharge: 2022-04-30 | Disposition: A | Discharge status: Home / Self Care | Payer: Medicare HMO | Attending: Cardiology | Admitting: Cardiology

## 2022-04-30 ENCOUNTER — Encounter (HOSPITAL_COMMUNITY): Payer: Self-pay | Admitting: Cardiology

## 2022-04-30 ENCOUNTER — Encounter (HOSPITAL_COMMUNITY)
Admission: RE | Disposition: A | Discharge status: Home / Self Care | Payer: Self-pay | Source: Home / Self Care | Attending: Cardiology

## 2022-04-30 DIAGNOSIS — I110 Hypertensive heart disease with heart failure: Secondary | ICD-10-CM | POA: Diagnosis not present

## 2022-04-30 DIAGNOSIS — R600 Localized edema: Secondary | ICD-10-CM | POA: Insufficient documentation

## 2022-04-30 DIAGNOSIS — I5022 Chronic systolic (congestive) heart failure: Secondary | ICD-10-CM

## 2022-04-30 DIAGNOSIS — R7303 Prediabetes: Secondary | ICD-10-CM | POA: Diagnosis not present

## 2022-04-30 DIAGNOSIS — Z8546 Personal history of malignant neoplasm of prostate: Secondary | ICD-10-CM | POA: Insufficient documentation

## 2022-04-30 DIAGNOSIS — E785 Hyperlipidemia, unspecified: Secondary | ICD-10-CM | POA: Insufficient documentation

## 2022-04-30 DIAGNOSIS — I493 Ventricular premature depolarization: Secondary | ICD-10-CM | POA: Insufficient documentation

## 2022-04-30 DIAGNOSIS — E890 Postprocedural hypothyroidism: Secondary | ICD-10-CM | POA: Diagnosis not present

## 2022-04-30 DIAGNOSIS — Z5986 Financial insecurity: Secondary | ICD-10-CM | POA: Insufficient documentation

## 2022-04-30 DIAGNOSIS — I5042 Chronic combined systolic (congestive) and diastolic (congestive) heart failure: Secondary | ICD-10-CM | POA: Insufficient documentation

## 2022-04-30 HISTORY — PX: RIGHT/LEFT HEART CATH AND CORONARY ANGIOGRAPHY: CATH118266

## 2022-04-30 LAB — POCT I-STAT EG7
Acid-base deficit: 1 mmol/L (ref 0.0–2.0)
Acid-base deficit: 1 mmol/L (ref 0.0–2.0)
Bicarbonate: 24.4 mmol/L (ref 20.0–28.0)
Bicarbonate: 24.5 mmol/L (ref 20.0–28.0)
Calcium, Ion: 1.22 mmol/L (ref 1.15–1.40)
Calcium, Ion: 1.23 mmol/L (ref 1.15–1.40)
HCT: 46 % (ref 39.0–52.0)
HCT: 46 % (ref 39.0–52.0)
Hemoglobin: 15.6 g/dL (ref 13.0–17.0)
Hemoglobin: 15.6 g/dL (ref 13.0–17.0)
O2 Saturation: 71 %
O2 Saturation: 72 %
Potassium: 3.5 mmol/L (ref 3.5–5.1)
Potassium: 3.5 mmol/L (ref 3.5–5.1)
Sodium: 143 mmol/L (ref 135–145)
Sodium: 143 mmol/L (ref 135–145)
TCO2: 26 mmol/L (ref 22–32)
TCO2: 26 mmol/L (ref 22–32)
pCO2, Ven: 44 mmHg (ref 44–60)
pCO2, Ven: 44.5 mmHg (ref 44–60)
pH, Ven: 7.352 (ref 7.25–7.43)
pH, Ven: 7.35 (ref 7.25–7.43)
pO2, Ven: 40 mmHg (ref 32–45)
pO2, Ven: 40 mmHg (ref 32–45)

## 2022-04-30 LAB — POCT I-STAT 7, (LYTES, BLD GAS, ICA,H+H)
Acid-base deficit: 3 mmol/L — ABNORMAL HIGH (ref 0.0–2.0)
Bicarbonate: 21.5 mmol/L (ref 20.0–28.0)
Calcium, Ion: 1.08 mmol/L — ABNORMAL LOW (ref 1.15–1.40)
HCT: 43 % (ref 39.0–52.0)
Hemoglobin: 14.6 g/dL (ref 13.0–17.0)
O2 Saturation: 97 %
Potassium: 3.1 mmol/L — ABNORMAL LOW (ref 3.5–5.1)
Sodium: 144 mmol/L (ref 135–145)
TCO2: 23 mmol/L (ref 22–32)
pCO2 arterial: 36.5 mmHg (ref 32–48)
pH, Arterial: 7.379 (ref 7.35–7.45)
pO2, Arterial: 91 mmHg (ref 83–108)

## 2022-04-30 SURGERY — RIGHT/LEFT HEART CATH AND CORONARY ANGIOGRAPHY
Anesthesia: LOCAL

## 2022-04-30 MED ORDER — VERAPAMIL HCL 2.5 MG/ML IV SOLN
INTRAVENOUS | Status: AC
  Filled 2022-04-30: qty 2

## 2022-04-30 MED ORDER — HEPARIN (PORCINE) IN NACL 1000-0.9 UT/500ML-% IV SOLN
INTRAVENOUS | Status: DC | PRN
  Administered 2022-04-30 (×2): 500 mL

## 2022-04-30 MED ORDER — SODIUM CHLORIDE 0.9% FLUSH: 3.0000 mL | INTRAVENOUS | Status: DC | PRN

## 2022-04-30 MED ORDER — HEPARIN SODIUM (PORCINE) 1000 UNIT/ML IJ SOLN
INTRAMUSCULAR | Status: AC
  Filled 2022-04-30: qty 10

## 2022-04-30 MED ORDER — LIDOCAINE HCL (PF) 1 % IJ SOLN
INTRAMUSCULAR | Status: DC | PRN
  Administered 2022-04-30 (×2): 2 mL via INTRADERMAL

## 2022-04-30 MED ORDER — IODIXANOL 320 MG/ML IV SOLN
INTRAVENOUS | Status: DC | PRN
  Administered 2022-04-30: 45 mL via INTRA_ARTERIAL

## 2022-04-30 MED ORDER — HEPARIN SODIUM (PORCINE) 1000 UNIT/ML IJ SOLN
INTRAMUSCULAR | Status: DC | PRN
  Administered 2022-04-30: 3500 [IU] via INTRAVENOUS

## 2022-04-30 MED ORDER — HEPARIN (PORCINE) IN NACL 1000-0.9 UT/500ML-% IV SOLN
INTRAVENOUS | Status: AC
  Filled 2022-04-30: qty 1000

## 2022-04-30 MED ORDER — SODIUM CHLORIDE 0.9 % IV SOLN: 250.0000 mL | INTRAVENOUS | Status: DC | PRN

## 2022-04-30 MED ORDER — SODIUM CHLORIDE 0.9% FLUSH: 3.0000 mL | Freq: Two times a day (BID) | INTRAVENOUS | Status: DC

## 2022-04-30 MED ORDER — LIDOCAINE HCL (PF) 1 % IJ SOLN
INTRAMUSCULAR | Status: AC
  Filled 2022-04-30: qty 30

## 2022-04-30 MED ORDER — VERAPAMIL HCL 2.5 MG/ML IV SOLN
INTRAVENOUS | Status: DC | PRN
  Administered 2022-04-30: 10 mL via INTRA_ARTERIAL

## 2022-04-30 MED ORDER — SODIUM CHLORIDE 0.9 % IV SOLN: INTRAVENOUS | Status: DC

## 2022-04-30 MED ORDER — ASPIRIN 81 MG PO CHEW: 81.0000 mg | CHEWABLE_TABLET | ORAL | Status: DC

## 2022-04-30 MED ORDER — MIDAZOLAM HCL 2 MG/2ML IJ SOLN
INTRAMUSCULAR | Status: DC | PRN
  Administered 2022-04-30: 1 mg via INTRAVENOUS

## 2022-04-30 MED ORDER — MIDAZOLAM HCL 2 MG/2ML IJ SOLN
INTRAMUSCULAR | Status: AC
  Filled 2022-04-30: qty 2

## 2022-04-30 MED ORDER — FENTANYL CITRATE (PF) 100 MCG/2ML IJ SOLN
INTRAMUSCULAR | Status: AC
  Filled 2022-04-30: qty 2

## 2022-04-30 MED ORDER — FENTANYL CITRATE (PF) 100 MCG/2ML IJ SOLN
INTRAMUSCULAR | Status: DC | PRN
  Administered 2022-04-30: 25 ug via INTRAVENOUS

## 2022-04-30 SURGICAL SUPPLY — 12 items
BAND ZEPHYR COMPRESS 30 LONG (HEMOSTASIS) ×1 IMPLANT
CATH 5FR JL3.5 JR4 ANG PIG MP (CATHETERS) ×1 IMPLANT
CATH BALLN WEDGE 5F 110CM (CATHETERS) ×1 IMPLANT
GLIDESHEATH SLEND SS 6F .021 (SHEATH) ×2 IMPLANT
GUIDEWIRE .025 260CM (WIRE) ×1 IMPLANT
GUIDEWIRE INQWIRE 1.5J.035X260 (WIRE) IMPLANT
INQWIRE 1.5J .035X260CM (WIRE) ×2
KIT HEART LEFT (KITS) ×2 IMPLANT
PACK CARDIAC CATHETERIZATION (CUSTOM PROCEDURE TRAY) ×2 IMPLANT
SHEATH GLIDE SLENDER 4/5FR (SHEATH) ×1 IMPLANT
TRANSDUCER W/STOPCOCK (MISCELLANEOUS) ×2 IMPLANT
TUBING CIL FLEX 10 FLL-RA (TUBING) ×2 IMPLANT

## 2022-04-30 NOTE — Interval H&P Note (Signed)
History and Physical Interval Note:  04/30/2022 12:41 PM  [name]  has presented today for surgery, with the diagnosis of systolic heart failure.  The various methods of treatment have been discussed with the patient and family. After consideration of risks, benefits and other options for treatment, the patient has consented to  Procedure(s): RIGHT/LEFT HEART CATH AND CORONARY ANGIOGRAPHY (N/A) as a surgical intervention.  The patient's history has been reviewed, patient examined, no change in status, stable for surgery.  I have reviewed the patient's chart and labs.  Questions were answered to the patient's satisfaction.     Collier Salina [facility] 04/30/2022 12:42 PM

## 2022-05-04 NOTE — Progress Notes (Unsigned)
Cardiology Office Note:    Date:  05/09/2022   ID:  Nassim, Cosma 12/15/1940, MRN [id number]  PCP:  Fayrene Helper, MD  Cardiologist:  None  Electrophysiologist:  None   Referring MD: Fayrene Helper, MD   Chief Complaint  Patient presents with   Congestive Heart Failure    History of Present Illness:    [name] is a 81 y.o. male with a hx of heart failure, hypertension, hyperlipidemia, hypothyroidism, prediabetes, prostate cancer who presents for follow-up.  He was referred by Dr. Moshe Cipro for evaluation of abnormal EKG, initially seen on 04/06/2022.  EKG 02/07/2022 showed frequent PVCs, inferior Q waves, poor R wave progression, sinus rhythm, rate 92.  He denies any chest pain, lightheadedness, syncope, or palpitations.  Does report he has been feeling fatigued.  He does notice shortness of breath with significant exertion, particularly if he is lifting heavy objects.  He does not exercise regularly but is active.  Does report some intermittent swelling in left leg.  No smoking history.  No family history of heart disease.  Echocardiogram 04/18/2022 showed EF 25 to 40%, grade 1 diastolic dysfunction, normal RV function, no significant valvular disease.  Zio patch x4 days on 04/17/2022 showed 191 episodes of NSVT (longest lasting 7 beats), 8 episodes of SVT (longest lasting 14 seconds with rate 155 bpm), very frequent PVCs (24% of beats) and occasional ventricular couplets (3.5%).  Cath on 05/01/2022 showed normal coronary arteries, normal filling pressures (RA 1, RV 19/0, PA 19/5/11, PCWP 4, LVEDP 11, CI 2.5).  Since last clinic visit, he reports that he is doing okay.  Denies any chest pain or dyspnea.  Does report some dizziness but denies any syncope.  Has persistent lower extremity edema.   Past Medical History:  Diagnosis Date   Allergic rhinitis, seasonal    BPH (benign prostatic hypertrophy)    Erectile dysfunction    History of kidney stones    Hx of  thyroidectomy    Hyperlipidemia    Hypertension    Hypothyroidism    Pre-diabetes    Prostate cancer ([place]) 2011   treated with radiation   Urosepsis 07/2011   ICU admission [facility]    Past Surgical History:  Procedure Laterality Date   COLONOSCOPY N/A 09/03/2013   Procedure: COLONOSCOPY;  Surgeon: Rogene Houston, MD;  Location: [facility] ENDO SU[facility];  Service: Endoscopy;  Laterality: N/A;  1225   COLONOSCOPY N/A 11/08/2015   Procedure: COLONOSCOPY;  Surgeon: Rogene Houston, MD;  Location: [facility] ENDO SUITE;  Service: Endoscopy;  Laterality: N/A;  1200   MASS EXCISION N/A 08/15/2017   Procedure: EXCISION SEBACEOUS CYST NECK;  Surgeon: Aviva Signs, MD;  Location: [facility] ORS;  Service: General;  Laterality: N/A;   PROSTATE SURGERY     RIGHT/LEFT HEART CATH AND CORONARY ANGIOGRAPHY N/A 04/30/2022   Procedure: RIGHT/LEFT HEART CATH AND CORONARY ANGIOGRAPHY;  Surgeon: Martinique, Peter M, MD;  Location: [place] CV LAB;  Service: Cardiovascular;  Laterality: N/A;   THYROIDECTOMY  2003   transurethral resection of thr prostate  2003   ureteral stone extraction  08/2011    Current Medications: Current Meds  Medication Sig   aspirin EC 81 MG tablet Take 1 tablet (81 mg total) by mouth daily.   cetirizine (ZYRTEC) 10 MG tablet TAKE 1 TABLET EVERY DAY   dapagliflozin propanediol (FARXIGA) 10 MG TABS tablet Take 1 tablet (10 mg total) by mouth daily before breakfast.   EPINEPHrine 0.3 mg/0.3 mL  IJ SOAJ injection Inject 0.3 mLs (0.3 mg total) into the muscle as needed for anaphylaxis.   levothyroxine (SYNTHROID) 75 MCG tablet Take 1 tablet (75 mcg total) by mouth daily.   losartan (COZAAR) 50 MG tablet Take 1 tablet (50 mg total) by mouth daily.   lovastatin (MEVACOR) 40 MG tablet TAKE 1 TABLET EVERY DAY   metoprolol succinate (TOPROL XL) 50 MG 24 hr tablet Take 1 tablet (50 mg total) by mouth daily. Take with or immediately following a meal.   Multiple Vitamin (MULTIVITAMIN WITH MINERALS) TABS tablet Take 1  tablet by mouth daily.   oxybutynin (DITROPAN) 5 MG tablet TAKE 1 TABLET EVERY DAY   spironolactone (ALDACTONE) 25 MG tablet Take 0.5 tablets (12.5 mg total) by mouth daily.   tadalafil (CIALIS) 5 MG tablet Take 1 tablet (5 mg total) by mouth daily.   tamsulosin (FLOMAX) 0.4 MG CAPS capsule Take 1 capsule (0.4 mg total) by mouth daily.   [DISCONTINUED] sacubitril-valsartan (ENTRESTO) 24-26 MG Take 1 tablet by mouth 2 (two) times daily.     Allergies:   Other, Peanut-containing drug products, Eggs or egg-derived products, Iodine, Shellfish allergy, and Penicillins   Social History   Socioeconomic History   Marital status: Married    Spouse name: Not on file   Number of children: 5   Years of education: Not on file   Highest education level: Not on file  Occupational History   Occupation: retired  Tobacco Use   Smoking status: Never   Smokeless tobacco: Never  Vaping Use   Vaping Use: Never used  Substance and Sexual Activity   Alcohol use: No    Alcohol/week: 0.0 standard drinks of alcohol   Drug use: No   Sexual activity: Yes  Other Topics Concern   Not on file  Social History Narrative   Not on file   Social Determinants of Health   Financial Resource Strain: Medium Risk (11/12/2021)   Overall Financial Resource Strain (CARDIA)    Difficulty of Paying Living Expenses: Somewhat hard  Food Insecurity: No Food Insecurity (06/13/2021)   Hunger Vital Sign    Worried About Running Out of Food in the Last Year: Never true    Ran Out of Food in the Last Year: Never true  Transportation Needs: No Transportation Needs (06/13/2021)   PRAPARE - Hydrologist (Medical): No    Lack of Transportation (Non-Medical): No  Physical Activity: Inactive (06/13/2021)   Exercise Vital Sign    Days of Exercise per Week: 0 days    Minutes of Exercise per Session: 0 min  Stress: No Stress Concern Present (06/13/2021)   [place]    Feeling of Stress : Not at all  Social Connections: Moderately Integrated (06/13/2021)   Social Connection and Isolation Panel [NHANES]    Frequency of Communication with Friends and Family: More than three times a week    Frequency of Social Gatherings with Friends and Family: More than three times a week    Attends Religious Services: More than 4 times per year    Active Member of [employer] or Organizations: No    Attends Music therapist: Never    Marital Status: Married     Family History: The patient's family history includes Diabetes in his brother, brother, and father; Gout in his brother; Hypertension in his father; Kidney disease in his brother; Stroke in his brother and mother.  ROS:  Please see the history of present illness.     All other systems reviewed and are negative.  EKGs/Labs/Other Studies Reviewed:    The following studies were reviewed today:   EKG:   04/24/22: Sinus rhythm, rate 84, frequent PVCs, inferior Q waves, poor R wave progression, left axis deviation 02/07/22:  frequent PVCs, inferior Q waves, poor R wave progression, sinus rhythm, rate 92.  Recent Labs: 02/07/2022: TSH 3.780 04/04/2022: Magnesium 2.3 04/24/2022: ALT 6; Platelets 201 04/29/2022: BUN 15; Creatinine, Ser 1.23 04/30/2022: Hemoglobin 15.6; Potassium 3.5; Sodium 143  Recent Lipid Panel    Component Value Date/Time   CHOL 146 02/07/2022 1043   TRIG 66 02/07/2022 1043   HDL 70 02/07/2022 1043   CHOLHDL 2.1 02/07/2022 1043   CHOLHDL 2.2 04/03/2020 1122   VLDL 15 01/06/2017 1416   LDLCALC 63 02/07/2022 1043   LDLCALC 64 04/03/2020 1122    Physical Exam:    VS:  BP 140/68   Pulse (!) 54   Ht '5\' 7"'$  (1.702 m)   Wt 151 lb (68.5 kg)   SpO2 99%   BMI 23.65 kg/m     Wt Readings from Last 3 Encounters:  05/09/22 151 lb (68.5 kg)  04/30/22 153 lb (69.4 kg)  04/24/22 153 lb 6.4 oz (69.6 kg)     GEN:   in no acute distress HEENT:  Normal NECK: No JVD; No carotid bruits CARDIAC: Irregular, normal rate, no murmurs, rubs, gallops RESPIRATORY:  Clear to auscultation without rales, wheezing or rhonchi  ABDOMEN: Soft, non-tender, non-distended MUSCULOSKELETAL:  1+ LLE edema SKIN: Warm and dry NEUROLOGIC:  Alert and oriented x 3 PSYCHIATRIC:  Normal affect   ASSESSMENT:    1. Acute combined systolic and diastolic heart failure ([place])   2. Frequent PVCs   3. Medication management   4. Essential hypertension   5. Hyperlipidemia, unspecified hyperlipidemia type      PLAN:    Acute combined heart failure: Echocardiogram 04/18/2022 showed EF 25 to 79%, grade 1 diastolic dysfunction, normal RV function, no significant valvular disease.  Cath on 05/01/2022 showed normal coronary arteries, normal filling pressures (RA 1, RV 19/0, PA 19/5/11, PCWP 4, LVEDP 11, CI 2.5).  -Recommend cardiac MRI for evaluation of nonischemic cardiomyopathy -Recommend referral to EP given frequent PVCs, which could be cause of cardiomyopathy -Continue Toprol-XL 50 mg daily -Reports he is unable to afford Entresto.  Encouraged him to fill out financial assistance paperwork, which he has been given but has not filled out.  In the meantime we will switch to losartan 50 mg daily -Reports he has not been able to afford [place].  Encouraged to follow financial assistance assistance paperwork and will provide samples -Add spironolactone 12.5 mg daily -Check BMET/magnesium  Frequent PVCs: Zio patch x4 days on 04/17/2022 showed 191 episodes of NSVT (longest lasting 7 beats), 8 episodes of SVT (longest lasting 14 seconds with rate 155 bpm), very frequent PVCs (24% of beats) and occasional ventricular couplets (3.5%). -Continue Toprol-XL.  Plan referral to EP as above  Hypertension: Continue Toprol-XL.  Switch Entresto to losartan as above.  Add spironolactone as above  Hyperlipidemia: On lovastatin 40 mg daily.  LDL 63 on 02/07/2022.  Left lower extremity  edema: Asymmetric left lower extremity swelling, lower extremity duplex showed no DVT on 04/24/2022.  Normal filling pressures on RHC as above.  Suspect venous insufficiency   RTC in 1 month  Medication Adjustments/Labs and Tests Ordered: Current medicines are reviewed at length with the patient  today.  Concerns regarding medicines are outlined above.  Orders Placed This Encounter  Procedures   Basic metabolic panel   Magnesium   Meds ordered this encounter  Medications   losartan (COZAAR) 50 MG tablet    Sig: Take 1 tablet (50 mg total) by mouth daily.    Dispense:  90 tablet    Refill:  3    05/09/22 Entresto stopped   spironolactone (ALDACTONE) 25 MG tablet    Sig: Take 0.5 tablets (12.5 mg total) by mouth daily.    Dispense:  45 tablet    Refill:  3    There are no Patient Instructions on file for this visit.   Signed, Donato Heinz, MD  05/09/2022 2:56 PM    [place]

## 2022-05-09 ENCOUNTER — Ambulatory Visit: Payer: Medicare HMO | Admitting: Cardiology

## 2022-05-09 ENCOUNTER — Encounter: Payer: Self-pay | Admitting: Cardiology

## 2022-05-09 ENCOUNTER — Other Ambulatory Visit (HOSPITAL_COMMUNITY)
Admission: RE | Admit: 2022-05-09 | Discharge: 2022-05-09 | Disposition: A | Discharge status: Home / Self Care | Payer: Medicare HMO | Source: Ambulatory Visit | Attending: Cardiology | Admitting: Cardiology

## 2022-05-09 VITALS — BP 140/68 | HR 54 | Ht 67.0 in | Wt 151.0 lb

## 2022-05-09 DIAGNOSIS — I493 Ventricular premature depolarization: Secondary | ICD-10-CM | POA: Diagnosis not present

## 2022-05-09 DIAGNOSIS — I10 Essential (primary) hypertension: Secondary | ICD-10-CM | POA: Diagnosis not present

## 2022-05-09 DIAGNOSIS — E785 Hyperlipidemia, unspecified: Secondary | ICD-10-CM

## 2022-05-09 DIAGNOSIS — I5041 Acute combined systolic (congestive) and diastolic (congestive) heart failure: Secondary | ICD-10-CM | POA: Diagnosis not present

## 2022-05-09 DIAGNOSIS — Z79899 Other long term (current) drug therapy: Secondary | ICD-10-CM

## 2022-05-09 LAB — BASIC METABOLIC PANEL
Anion gap: 6 (ref 5–15)
BUN: 16 mg/dL (ref 8–23)
CO2: 27 mmol/L (ref 22–32)
Calcium: 8.8 mg/dL — ABNORMAL LOW (ref 8.9–10.3)
Chloride: 109 mmol/L (ref 98–111)
Creatinine, Ser: 1.51 mg/dL — ABNORMAL HIGH (ref 0.61–1.24)
GFR, Estimated: 46 mL/min — ABNORMAL LOW (ref >60)
Glucose, Bld: 96 mg/dL (ref 70–99)
Potassium: 3.9 mmol/L (ref 3.5–5.1)
Sodium: 142 mmol/L (ref 135–145)

## 2022-05-09 LAB — HEMOGLOBIN AND HEMATOCRIT, BLOOD
HCT: 47.1 % (ref 39.0–52.0)
Hemoglobin: 15 g/dL (ref 13.0–17.0)

## 2022-05-09 LAB — MAGNESIUM: Magnesium: 2.3 mg/dL (ref 1.7–2.4)

## 2022-05-09 MED ORDER — SPIRONOLACTONE 25 MG PO TABS: 12.5000 mg | ORAL_TABLET | Freq: Every day | ORAL | 3 refills | Status: DC

## 2022-05-09 MED ORDER — DAPAGLIFLOZIN PROPANEDIOL 10 MG PO TABS: 10.0000 mg | ORAL_TABLET | Freq: Every day | ORAL | 0 refills | Status: DC

## 2022-05-09 MED ORDER — LOSARTAN POTASSIUM 50 MG PO TABS: 50.0000 mg | ORAL_TABLET | Freq: Every day | ORAL | 3 refills | Status: DC

## 2022-05-09 NOTE — Addendum Note (Signed)
Addended by: Barbarann Ehlers A on: 05/09/2022 03:26 PM   Modules accepted: Orders

## 2022-05-09 NOTE — Patient Instructions (Addendum)
Medication Instructions:  STOP Entresto   START Losartan 50 mg daily  START Spironolactone 12.5 mg daily     Samples given today: Farxiga 10 mg, #21, lot [id number], exp 07/13/24  Labwork:  BMET,magnesium now  Testing/Procedures:    You are scheduled for Cardiac MRI on ______________. Please arrive for your appointment at ______________ ( arrive 30-45 minutes prior to test start time). ?  [facility] [address] [place], [place] [address] [phone] Please take advantage of the free valet parking available at the MAIN entrance (A entrance).  Proceed to the [facility] Radiology Department (First Floor) for check-in.    Magnetic resonance imaging (MRI) is a painless test that produces images of the inside of the body without using Xrays.  During an MRI, strong magnets and radio waves work together in a Research officer, political party to form detailed images.   MRI images may provide more details about a medical condition than X-rays, CT scans, and ultrasounds can provide.  You may be given earphones to listen for instructions.  You may eat a light breakfast and take medications as ordered with the exception of HCTZ (fluid pill, other). Please avoid stimulants for 12 hr prior to test. (Ie. Caffeine, nicotine, chocolate, or antihistamine medications)  If a contrast material will be used, an IV will be inserted into one of your veins. Contrast material will be injected into your IV. It will leave your body through your urine within a day. You may be told to drink plenty of fluids to help flush the contrast material out of your system.  You will be asked to remove all metal, including: Watch, jewelry, and other metal objects including hearing aids, hair pieces and dentures. Also wearable glucose monitoring systems (ie. Freestyle Libre and Omnipods) (Braces and fillings normally are not a problem.)   TEST WILL TAKE APPROXIMATELY 1 HOUR  PLEASE NOTIFY SCHEDULING AT LEAST 24  HOURS IN ADVANCE IF YOU ARE UNABLE TO KEEP YOUR APPOINTMENT. [phone]  Please call Marchia Bond, cardiac imaging nurse navigator with any questions/concerns. Marchia Bond RN Navigator Cardiac Imaging Gordy Clement RN Navigator Cardiac Imaging [facility] [phone] Office      Follow-Up:  06/11/21 at 2:20 pm in [place] office with Dr.Schumann  Any Other Special Instructions Will Be Listed Below (If Applicable).   You have been referred to Electrophysiology, they will call you to schedule an appointment at the [facility] office.   If you need a refill on your cardiac medications before your next appointment, please call your pharmacy.

## 2022-05-28 ENCOUNTER — Encounter: Payer: Self-pay | Admitting: Cardiology

## 2022-05-29 ENCOUNTER — Other Ambulatory Visit: Payer: Self-pay | Admitting: *Deleted

## 2022-05-29 MED ORDER — DAPAGLIFLOZIN PROPANEDIOL 10 MG PO TABS: 10.0000 mg | ORAL_TABLET | Freq: Every day | ORAL | 3 refills | Status: DC

## 2022-06-10 NOTE — Progress Notes (Deleted)
Cardiology Office Note:    Date:  06/10/2022   ID:  [name], [name] [birth year], MRN [id number]  PCP:  Fayrene Helper, MD  Cardiologist:  None  Electrophysiologist:  None   Referring MD: Fayrene Helper, MD   No chief complaint on file.   History of Present Illness:    [name] is a 81 y.o. male with a hx of heart failure, hypertension, hyperlipidemia, hypothyroidism, prediabetes, prostate cancer who presents for follow-up.  He was referred by Dr. Moshe Cipro for evaluation of abnormal EKG, initially seen on 04/06/2022.  EKG 02/07/2022 showed frequent PVCs, inferior Q waves, poor R wave progression, sinus rhythm, rate 92.  He denies any chest pain, lightheadedness, syncope, or palpitations.  Does report he has been feeling fatigued.  He does notice shortness of breath with significant exertion, particularly if he is lifting heavy objects.  He does not exercise regularly but is active.  Does report some intermittent swelling in left leg.  No smoking history.  No family history of heart disease.  Echocardiogram 04/18/2022 showed EF 25 to 34%, grade 1 diastolic dysfunction, normal RV function, no significant valvular disease.  Zio patch x4 days on 04/17/2022 showed 191 episodes of NSVT (longest lasting 7 beats), 8 episodes of SVT (longest lasting 14 seconds with rate 155 bpm), very frequent PVCs (24% of beats) and occasional ventricular couplets (3.5%).  Cath on 05/01/2022 showed normal coronary arteries, normal filling pressures (RA 1, RV 19/0, PA 19/5/11, PCWP 4, LVEDP 11, CI 2.5).  Since last clinic visit,  he reports that he is doing okay.  Denies any chest pain or dyspnea.  Does report some dizziness but denies any syncope.  Has persistent lower extremity edema.   Past Medical History:  Diagnosis Date   Allergic rhinitis, seasonal    BPH (benign prostatic hypertrophy)    Erectile dysfunction    History of kidney stones    Hx of thyroidectomy    Hyperlipidemia     Hypertension    Hypothyroidism    Pre-diabetes    Prostate cancer ([place]) 2011   treated with radiation   Urosepsis 07/2011   ICU admission [facility]    Past Surgical History:  Procedure Laterality Date   COLONOSCOPY N/A 09/03/2013   Procedure: COLONOSCOPY;  Surgeon: Rogene Houston, MD;  [facility];  Service: Endoscopy;  Laterality: N/A;  1225   COLONOSCOPY N/A 11/08/2015   Procedure: COLONOSCOPY;  Surgeon: Rogene [place], MD;  [facility];  Service: Endoscopy;  Laterality: N/A;  1200   MASS EXCISION N/A 08/15/2017   Procedure: EXCISION SEBACEOUS CYST NECK;  Surgeon: Aviva Signs, MD;  Location: [facility];  Service: General;  Laterality: N/A;   PROSTATE SURGERY     RIGHT/LEFT HEART CATH AND CORONARY ANGIOGRAPHY N/A 04/30/2022   Procedure: RIGHT/LEFT HEART CATH AND CORONARY ANGIOGRAPHY;  Surgeon: Martinique, Peter M, MD;  Location: [place] [facility];  Service: Cardiovascular;  Laterality: N/A;   THYROIDECTOMY  2003   transurethral resection of thr prostate  2003   ureteral stone extraction  08/2011    Current Medications: No outpatient medications have been marked as taking for the 06/11/22 encounter (Appointment) with Donato Heinz, MD.     Allergies:   Other, Peanut-containing drug products, Eggs or egg-derived products, Iodine, Shellfish allergy, and Penicillins   Social History   Socioeconomic History   Marital status: Married    Spouse name: Not on file   Number of children: 5  Years of education: Not on file   Highest education level: Not on file  Occupational History   Occupation: retired  Tobacco Use   Smoking status: Never   Smokeless tobacco: Never  Vaping Use   Vaping Use: Never used  Substance and Sexual Activity   Alcohol use: No    Alcohol/week: 0.0 standard drinks of alcohol   Drug use: No   Sexual activity: Yes  Other Topics Concern   Not on file  Social History Narrative   Not on file   Social Determinants of Health    Financial Resource Strain: Medium Risk (11/12/2021)   Overall Financial Resource Strain (CARDIA)    Difficulty of Paying Living Expenses: Somewhat hard  Food Insecurity: No Food Insecurity (06/13/2021)   Hunger Vital Sign    Worried About Running Out of Food in the Last Year: Never true    Ran Out of Food in the Last Year: Never true  Transportation Needs: No Transportation Needs (06/13/2021)   PRAPARE - Hydrologist (Medical): No    Lack of Transportation (Non-Medical): No  Physical Activity: Inactive (06/13/2021)   Exercise Vital Sign    Days of Exercise per Week: 0 days    Minutes of Exercise per Session: 0 min  Stress: No Stress Concern Present (06/13/2021)   [place]    Feeling of Stress : Not at all  Social Connections: Moderately Integrated (06/13/2021)   Social Connection and Isolation Panel [NHANES]    Frequency of Communication with Friends and Family: More than three times a week    Frequency of Social Gatherings with Friends and Family: More than three times a week    Attends Religious Services: More than 4 times per year    Active Member of [employer] or Organizations: No    Attends Music therapist: Never    Marital Status: Married     Family History: The patient's family history includes Diabetes in his brother, brother, and father; Gout in his brother; Hypertension in his father; Kidney disease in his brother; Stroke in his brother and mother.  ROS:   Please see the history of present illness.     All other systems reviewed and are negative.  EKGs/Labs/Other Studies Reviewed:    The following studies were reviewed today:   EKG:   04/24/22: Sinus rhythm, rate 84, frequent PVCs, inferior Q waves, poor R wave progression, left axis deviation 02/07/22:  frequent PVCs, inferior Q waves, poor R wave progression, sinus rhythm, rate 92.  Recent Labs: 02/07/2022: TSH  3.780 04/24/2022: ALT 6; Platelets 201 05/09/2022: BUN 16; Creatinine, Ser 1.51; Hemoglobin 15.0; Magnesium 2.3; Potassium 3.9; Sodium 142  Recent Lipid Panel    Component Value Date/Time   CHOL 146 02/07/2022 1043   TRIG 66 02/07/2022 1043   HDL 70 02/07/2022 1043   CHOLHDL 2.1 02/07/2022 1043   CHOLHDL 2.2 04/03/2020 1122   VLDL 15 01/06/2017 1416   LDLCALC 63 02/07/2022 1043   LDLCALC 64 04/03/2020 1122    Physical Exam:    VS:  There were no vitals taken for this visit.    Wt Readings from Last 3 Encounters:  05/09/22 151 lb (68.5 kg)  04/30/22 153 lb (69.4 kg)  04/24/22 153 lb 6.4 oz (69.6 kg)     GEN:   in no acute distress HEENT: Normal NECK: No JVD; No carotid bruits CARDIAC: Irregular, normal rate, no murmurs, rubs, gallops RESPIRATORY:  Clear to auscultation without rales, wheezing or rhonchi  ABDOMEN: Soft, non-tender, non-distended MUSCULOSKELETAL:  1+ LLE edema SKIN: Warm and dry NEUROLOGIC:  Alert and oriented x 3 PSYCHIATRIC:  Normal affect   ASSESSMENT:    No diagnosis found.    PLAN:    Acute combined heart failure: Echocardiogram 04/18/2022 showed EF 25 to 63%, grade 1 diastolic dysfunction, normal RV function, no significant valvular disease.  Cath on 05/01/2022 showed normal coronary arteries, normal filling pressures (RA 1, RV 19/0, PA 19/5/11, PCWP 4, LVEDP 11, CI 2.5).  -Recommend cardiac MRI for evaluation of nonischemic cardiomyopathy -Recommend referral to EP given frequent PVCs, which could be cause of cardiomyopathy -Continue Toprol-XL 50 mg daily -Reports he is unable to afford Entresto.  Encouraged him to fill out financial assistance paperwork, which he has been given but has not filled out.  In the meantime we will switch to losartan 50 mg daily -Reports he has not been able to afford [place].  Encouraged to follow financial assistance assistance paperwork and will provide samples -Add spironolactone 12.5 mg daily -Check  BMET/magnesium  Frequent PVCs: Zio patch x4 days on 04/17/2022 showed 191 episodes of NSVT (longest lasting 7 beats), 8 episodes of SVT (longest lasting 14 seconds with rate 155 bpm), very frequent PVCs (24% of beats) and occasional ventricular couplets (3.5%). -Continue Toprol-XL.  Plan referral to EP as above  Hypertension: Continue Toprol-XL.  Switch Entresto to losartan as above.  Add spironolactone as above  Hyperlipidemia: On lovastatin 40 mg daily.  LDL 63 on 02/07/2022.  Left lower extremity edema: Asymmetric left lower extremity swelling, lower extremity duplex showed no DVT on 04/24/2022.  Normal filling pressures on RHC as above.  Suspect venous insufficiency   RTC in 1 month  Medication Adjustments/Labs and Tests Ordered: Current medicines are reviewed at length with the patient today.  Concerns regarding medicines are outlined above.  No orders of the defined types were placed in this encounter.  No orders of the defined types were placed in this encounter.   There are no Patient Instructions on file for this visit.   Signed, Donato Heinz, MD  06/10/2022 10:01 PM    [place]

## 2022-06-11 ENCOUNTER — Encounter: Payer: Self-pay | Admitting: Cardiology

## 2022-06-11 ENCOUNTER — Ambulatory Visit: Payer: Medicare HMO | Admitting: Cardiology

## 2022-06-12 ENCOUNTER — Encounter: Payer: Self-pay | Admitting: *Deleted

## 2022-06-18 ENCOUNTER — Other Ambulatory Visit: Payer: Self-pay

## 2022-06-18 ENCOUNTER — Other Ambulatory Visit: Payer: Self-pay | Admitting: Family Medicine

## 2022-06-18 MED ORDER — LEVOTHYROXINE SODIUM 75 MCG PO TABS: 75.0000 ug | ORAL_TABLET | Freq: Every day | ORAL | 1 refills | Status: DC

## 2022-06-18 MED ORDER — TADALAFIL 5 MG PO TABS: 5.0000 mg | ORAL_TABLET | Freq: Every day | ORAL | 11 refills | Status: DC

## 2022-06-21 ENCOUNTER — Telehealth: Payer: Self-pay | Admitting: Cardiology

## 2022-06-21 NOTE — Telephone Encounter (Signed)
Notified pt that [place] office is currently out of samples for [place]. Sent msg to nurse at [facility] office who stated they were also out of samples.  Pt filled out PAF for [place]. Will inquire about status of pt's application.

## 2022-06-21 NOTE — Telephone Encounter (Signed)
Patient here. Would like samples of Farxiga.

## 2022-06-24 ENCOUNTER — Ambulatory Visit (INDEPENDENT_AMBULATORY_CARE_PROVIDER_SITE_OTHER): Payer: Medicare HMO

## 2022-06-24 DIAGNOSIS — Z0000 Encounter for general adult medical examination without abnormal findings: Secondary | ICD-10-CM | POA: Diagnosis not present

## 2022-06-24 NOTE — Progress Notes (Signed)
I connected with  Burnard Bunting on 06/24/22 by a audio enabled telemedicine application and verified that I am speaking with the correct person using two identifiers.  Patient Location: Home  Provider Location: Home Office  I discussed the limitations of evaluation and management by telemedicine. The patient expressed understanding and agreed to proceed.  Subjective:   [name] is a 81 y.o. male who presents for Medicare Annual/Subsequent preventive examination.  Review of Systems     Cardiac Risk Factors include: dyslipidemia;hypertension;male gender;advanced age (>8mn, >>46women)     Objective:    There were no vitals filed for this visit. There is no height or weight on file to calculate BMI.     06/24/2022    1:14 PM 04/30/2022    9:19 AM 06/13/2021    3:43 PM 02/16/2018   10:47 AM 08/12/2017    1:39 PM 01/09/2017   11:19 AM 11/08/2015   10:54 AM  Advanced Directives  Does Patient Have a Medical Advance Directive? No No No Yes No No No  Does patient want to make changes to medical advance directive?    Yes (MAU/Ambulatory/Procedural Areas - Information given)     Would patient like information on creating a medical advance directive? Yes (ED - Information included in AVS) No - Patient declined   No - Patient declined Yes (MAU/Ambulatory/Procedural Areas - Information given) Yes - Educational materials given    Current Medications (verified) Outpatient Encounter Medications as of 06/24/2022  Medication Sig   aspirin EC 81 MG tablet Take 1 tablet (81 mg total) by mouth daily.   cetirizine (ZYRTEC) 10 MG tablet TAKE 1 TABLET EVERY DAY   dapagliflozin propanediol (FARXIGA) 10 MG TABS tablet Take 1 tablet (10 mg total) by mouth daily before breakfast.   dapagliflozin propanediol (FARXIGA) 10 MG TABS tablet Take 1 tablet (10 mg total) by mouth daily before breakfast.   EPINEPHrine 0.3 mg/0.3 mL IJ SOAJ injection Inject 0.3 mLs (0.3 mg total) into the muscle as  needed for anaphylaxis.   levothyroxine (SYNTHROID) 75 MCG tablet Take 1 tablet (75 mcg total) by mouth daily.   levothyroxine (SYNTHROID) 75 MCG tablet Take 1 tablet (75 mcg total) by mouth daily.   losartan (COZAAR) 50 MG tablet Take 1 tablet (50 mg total) by mouth daily.   lovastatin (MEVACOR) 40 MG tablet TAKE 1 TABLET EVERY DAY   metoprolol succinate (TOPROL XL) 50 MG 24 hr tablet Take 1 tablet (50 mg total) by mouth daily. Take with or immediately following a meal.   Multiple Vitamin (MULTIVITAMIN WITH MINERALS) TABS tablet Take 1 tablet by mouth daily.   oxybutynin (DITROPAN) 5 MG tablet TAKE 1 TABLET EVERY DAY   predniSONE (DELTASONE) 10 MG tablet Take 1 tablet (10 mg total) by mouth 2 (two) times daily with a meal. (Patient not taking: Reported on 05/09/2022)   predniSONE (DELTASONE) 50 MG tablet Take 1 tablet 13 hours prior to procedure, 1 tablet 7 hours prior to procedure, and 1 tablet (with Benedryl 50 mg) prior to going to the hospital for procedure (Patient not taking: Reported on 05/09/2022)   spironolactone (ALDACTONE) 25 MG tablet Take 0.5 tablets (12.5 mg total) by mouth daily.   tadalafil (CIALIS) 5 MG tablet Take 1 tablet (5 mg total) by mouth daily.   tadalafil (CIALIS) 5 MG tablet Take 1 tablet (5 mg total) by mouth daily.   tamsulosin (FLOMAX) 0.4 MG CAPS capsule Take 1 capsule (0.4 mg total) by mouth daily.  No facility-administered encounter medications on file as of 06/24/2022.    Allergies (verified) Other, Peanut-containing drug products, Eggs or egg-derived products, Iodine, Shellfish allergy, and Penicillins   History: Past Medical History:  Diagnosis Date   Allergic rhinitis, seasonal    BPH (benign prostatic hypertrophy)    Erectile dysfunction    History of kidney stones    Hx of thyroidectomy    Hyperlipidemia    Hypertension    Hypothyroidism    Pre-diabetes    Prostate cancer ([place]) 2011   treated with radiation   Urosepsis 07/2011   ICU admission  [facility]   Past Surgical History:  Procedure Laterality Date   COLONOSCOPY N/A 09/03/2013   Procedure: COLONOSCOPY;  Surgeon: Rogene Houston, MD;  [facility];  Service: Endoscopy;  Laterality: N/A;  1225   COLONOSCOPY N/A 11/08/2015   Procedure: COLONOSCOPY;  Surgeon: Rogene Houston, MD;  [facility];  Service: Endoscopy;  Laterality: N/A;  1200   MASS EXCISION N/A 08/15/2017   Procedure: EXCISION SEBACEOUS CYST NECK;  Surgeon: Aviva Signs, MD;  Location: [facility];  Service: General;  Laterality: N/A;   PROSTATE SURGERY     RIGHT/LEFT HEART CATH AND CORONARY ANGIOGRAPHY N/A 04/30/2022   Procedure: RIGHT/LEFT HEART CATH AND CORONARY ANGIOGRAPHY;  Surgeon: Martinique, Peter M, MD;  Location: [place] [facility];  Service: Cardiovascular;  Laterality: N/A;   THYROIDECTOMY  2003   transurethral resection of thr prostate  2003   ureteral stone extraction  08/2011   Family History  Problem Relation Age of Onset   Stroke Mother    Diabetes Father    Hypertension Father    Kidney disease Brother    Stroke Brother    Diabetes Brother    Gout Brother    Diabetes Brother    Social History   Socioeconomic History   Marital status: Married    Spouse name: Not on file   Number of children: 5   Years of education: Not on file   Highest education level: Not on file  Occupational History   Occupation: retired  Tobacco Use   Smoking status: Never   Smokeless tobacco: Never  Vaping Use   Vaping Use: Never used  Substance and Sexual Activity   Alcohol use: No    Alcohol/week: 0.0 standard drinks of alcohol   Drug use: No   Sexual activity: Yes  Other Topics Concern   Not on file  Social History Narrative   Not on file   Social Determinants of Health   Financial Resource Strain: Medium Risk (11/12/2021)   Overall Financial Resource Strain (CARDIA)    Difficulty of Paying Living Expenses: Somewhat hard  Food Insecurity: No Food Insecurity (06/24/2022)   Hunger Vital  Sign    Worried About Running Out of Food in the Last Year: Never true    [place] in the Last Year: Never true  Transportation Needs: No Transportation Needs (06/24/2022)   PRAPARE - Hydrologist (Medical): No    Lack of Transportation (Non-Medical): No  Physical Activity: Insufficiently Active (06/24/2022)   Exercise Vital Sign    Days of Exercise per Week: 3 days    Minutes of Exercise per Session: 20 min  Stress: No Stress Concern Present (06/13/2021)   [place]    Feeling of Stress : Not at all  Social Connections: Moderately Integrated (06/24/2022)   Social Connection  and Isolation Panel [NHANES]    Frequency of Communication with Friends and Family: More than three times a week    Frequency of Social Gatherings with Friends and Family: More than three times a week    Attends Religious Services: More than 4 times per year    Active Member of [employer] or Organizations: No    Attends Archivist Meetings: Never    Marital Status: Married    Tobacco Counseling Counseling given: Not Answered   Clinical Intake:  Pre-visit preparation completed: Yes        Diabetes: No     Diabetic?no         Activities of Daily Living    06/24/2022    1:19 PM  In your present state of health, do you have any difficulty performing the following activities:  Hearing? 0  Vision? 0  Difficulty concentrating or making decisions? 0  Walking or climbing stairs? 0  Dressing or bathing? 0  Doing errands, shopping? 0  Preparing Food and eating ? N  Using the Toilet? N  In the past six months, have you accidently leaked urine? N  Do you have problems with loss of bowel control? N  Managing your Medications? N  Managing your Finances? N  Housekeeping or managing your Housekeeping? N    Patient Care Team: Fayrene Helper, MD as PCP - General Enzo Montgomery, MD as  Attending Physician (Urology)  Indicate any recent Medical Services you may have received from other than [facility] providers in the past year (date may be approximate).     Assessment:   This is a routine wellness examination for Faron.  Hearing/Vision screen No results found.  Dietary issues and exercise activities discussed: Current Exercise Habits: Home exercise routine, Time (Minutes): 20, Frequency (Times/Week): 3, Weekly Exercise (Minutes/Week): 60, Intensity: Mild   Goals Addressed   None    Depression Screen    04/03/2022    1:06 PM 06/13/2021    3:39 PM 04/04/2021    1:06 PM 10/04/2020    1:05 PM 03/30/2020    1:33 PM 09/30/2019    1:18 PM 08/04/2019   11:20 AM  PHQ 2/9 Scores  PHQ - 2 Score 0 0 0 0 0 0 0    Fall Risk    06/24/2022    1:19 PM 04/03/2022    1:06 PM 02/07/2022    9:45 AM 06/13/2021    3:43 PM 04/04/2021    1:06 PM  [place] in the past year? 0 0 0 0 0  Number falls in past yr: 0 0 0 0 0  Injury with Fall? 0 0 0 0 0  Risk for fall due to :  No Fall Risks  No Fall Risks   Follow up  Falls evaluation completed  Falls evaluation completed     [place]:  Any stairs in or around the home? No  If so, are there any without handrails? No  Home free of loose throw rugs in walkways, pet beds, electrical cords, etc? Yes  Adequate lighting in your home to reduce risk of falls? Yes   ASSISTIVE DEVICES UTILIZED TO PREVENT FALLS:  Life alert? Yes  Use of a cane, walker or w/c? No  Grab bars in the bathroom? No  Shower chair or bench in shower? Yes  Elevated toilet seat or a handicapped toilet? No    Cognitive Function:  06/13/2021    3:44 PM 06/12/2020    3:33 PM 02/18/2019    8:31 AM 02/16/2018   10:51 AM 01/09/2017   11:23 AM  6CIT Screen  What Year? 0 points 0 points 0 points 0 points 0 points  What month? 0 points 0 points 0 points 0 points 0 points  What time? 0 points 0 points 0 points 0 points  0 points  Count back from 20 0 points 0 points 0 points 0 points 0 points  Months in reverse 2 points 0 points 0 points 0 points 0 points  Repeat phrase 0 points 4 points 0 points 0 points 0 points  Total Score 2 points 4 points 0 points 0 points 0 points    Immunizations Immunization History  Administered Date(s) Administered   Influenza Whole 09/28/2004   MODERNA COVID-19 SARS-COV-2 PEDS BIVALENT BOOSTER 6Y-11Y 10/02/2021   Moderna Sars-Covid-2 Vaccination 12/01/2019, 12/29/2019, 08/29/2020, 04/10/2021, 10/02/2021   Pneumococcal Conjugate-13 09/05/2014   Pneumococcal Polysaccharide-23 03/19/2004, 07/11/2009   Td 03/19/2004   Zoster Recombinat (Shingrix) 10/02/2021, 01/24/2022    TDAP status: Due, Education has been provided regarding the importance of this vaccine. Advised may receive this vaccine at local pharmacy or Health Dept. Aware to provide a copy of the vaccination record if obtained from local pharmacy or Health Dept. Verbalized acceptance and understanding.  Flu Vaccine status: Due, Education has been provided regarding the importance of this vaccine. Advised may receive this vaccine at local pharmacy or Health Dept. Aware to provide a copy of the vaccination record if obtained from local pharmacy or Health Dept. Verbalized acceptance and understanding.  Pneumococcal vaccine status: Up to date  Covid-19 vaccine status: Completed vaccines  Qualifies for Shingles Vaccine? Yes   Zostavax completed No   Shingrix Completed?: Yes  Screening Tests Health Maintenance  Topic Date Due   TETANUS/TDAP  03/19/2014   COLONOSCOPY (Pts 45-22yr Insurance coverage will need to be confirmed)  11/07/2018   COVID-19 Vaccine (6 - Moderna risk series) 11/27/2021   HEMOGLOBIN A1C  08/09/2022   Pneumonia Vaccine 81 Years old  Completed   Zoster Vaccines- Shingrix  Completed   HPV VACCINES  Aged Out   INFLUENZA VACCINE  Discontinued    Health Maintenance  Health Maintenance Due   Topic Date Due   TETANUS/TDAP  03/19/2014   COLONOSCOPY (Pts 45-478yrInsurance coverage will need to be confirmed)  11/07/2018   COVID-19 Vaccine (6 - Moderna risk series) 11/27/2021    Colorectal cancer screening: No longer required.   Lung Cancer Screening: (Low Dose CT Chest recommended if Age 83-80ears, 30 pack-year currently smoking OR have quit w/in 15years.) does not qualify.   Lung Cancer Screening Referral: na  Additional Screening:  Hepatitis C Screening: does qualify; Completed   Vision Screening: Recommended annual ophthalmology exams for early detection of glaucoma and other disorders of the eye. Is the patient up to date with their annual eye exam?  Yes  Who is the provider or what is the name of the office in which the patient attends annual eye exams? Dr shGershon CraneIf pt is not established with a provider, would they like to be referred to a provider to establish care? No .   Dental Screening: Recommended annual dental exams for proper oral hygiene  Community Resource Referral / Chronic Care Management: CRR required this visit?  No   CCM required this visit?  No      Plan:     I have personally  reviewed and noted the following in the patient's chart:   Medical and social history Use of alcohol, tobacco or illicit drugs  Current medications and supplements including opioid prescriptions. Patient is not currently taking opioid prescriptions. Functional ability and status Nutritional status Physical activity Advanced directives List of other physicians Hospitalizations, surgeries, and ER visits in previous 12 months Vitals Screenings to include cognitive, depression, and falls Referrals and appointments  In addition, I have reviewed and discussed with patient certain preventive protocols, quality metrics, and best practice recommendations. A written personalized care plan for preventive services as well as general preventive health recommendations were  provided to patient.     Eual Fines, LPN   0/31/5945   Nurse Notes: .Schedule your next wellness visit for 1 year at checkout

## 2022-06-24 NOTE — Patient Instructions (Signed)
  [name] , Thank you for taking time to come for your Medicare Wellness Visit. I appreciate your ongoing commitment to your health goals. Please review the following plan we discussed and let me know if I can assist you in the future.   These are the goals we discussed:  Goals      DIET - EAT MORE FRUITS AND VEGETABLES     Avoid fried fatty foods     Exercise 3x per week (30 min per time)     Recommend starting a routine exercise program at least 3 days a week for 30-45 minutes at a time as tolerated.       Follow up with Primary Care Provider     As recommended by physician        This is a list of the screening recommended for you and due dates:  Health Maintenance  Topic Date Due   Tetanus Vaccine  03/19/2014   Colon Cancer Screening  11/07/2018   COVID-19 Vaccine (6 - Moderna risk series) 11/27/2021   Hemoglobin A1C  08/09/2022   Pneumonia Vaccine  Completed   Zoster (Shingles) Vaccine  Completed   HPV Vaccine  Aged Out   Flu Shot  Discontinued

## 2022-07-16 ENCOUNTER — Other Ambulatory Visit: Payer: Self-pay

## 2022-07-16 MED ORDER — DAPAGLIFLOZIN PROPANEDIOL 10 MG PO TABS: 10.0000 mg | ORAL_TABLET | Freq: Every day | ORAL | 0 refills | Status: DC

## 2022-07-24 NOTE — Progress Notes (Signed)
Cardiology Office Note:    Date:  07/25/2022   ID:  [name], [name] [birth year], MRN [id number]  PCP:  Fayrene Helper, MD  Cardiologist:  Donato Heinz, MD  Electrophysiologist:  None   Referring MD: Fayrene Helper, MD   Chief Complaint  Patient presents with   Congestive Heart Failure    History of Present Illness:    Keon Pender is a 81 y.o. male with a hx of heart failure, hypertension, hyperlipidemia, hypothyroidism, prediabetes, prostate cancer who presents for follow-up.  He was referred by Dr. Moshe Cipro for evaluation of abnormal EKG, initially seen on 04/06/2022.  EKG 02/07/2022 showed frequent PVCs, inferior Q waves, poor R wave progression, sinus rhythm, rate 92.  He denies any chest pain, lightheadedness, syncope, or palpitations.  Does report he has been feeling fatigued.  He does notice shortness of breath with significant exertion, particularly if he is lifting heavy objects.  He does not exercise regularly but is active.  Does report some intermittent swelling in left leg.  No smoking history.  No family history of heart disease.  Echocardiogram 04/18/2022 showed EF 25 to 29%, grade 1 diastolic dysfunction, normal RV function, no significant valvular disease.  Zio patch x4 days on 04/17/2022 showed 191 episodes of NSVT (longest lasting 7 beats), 8 episodes of SVT (longest lasting 14 seconds with rate 155 bpm), very frequent PVCs (24% of beats) and occasional ventricular couplets (3.5%).  Cath on 05/01/2022 showed normal coronary arteries, normal filling pressures (RA 1, RV 19/0, PA 19/5/11, PCWP 4, LVEDP 11, CI 2.5).  Since last clinic visit, he reports that he is doing well.  Denies any chest pain, dyspnea, lightheadedness, syncope,  or palpitations.  Reports persistent left lower extremity edema.    Past Medical History:  Diagnosis Date   Allergic rhinitis, seasonal    BPH (benign prostatic hypertrophy)    Erectile dysfunction    History of  kidney stones    Hx of thyroidectomy    Hyperlipidemia    Hypertension    Hypothyroidism    Pre-diabetes    Prostate cancer ([place]) 2011   treated with radiation   Urosepsis 07/2011   ICU admission [facility]    Past Surgical History:  Procedure Laterality Date   COLONOSCOPY N/A 09/03/2013   Procedure: COLONOSCOPY;  Surgeon: Rogene Houston, MD;  Location: [facility];  Service: Endoscopy;  Laterality: N/A;  1225   COLONOSCOPY N/A 11/08/2015   Procedure: COLONOSCOPY;  Surgeon: Rogene [place], MD;  Location: [facility] END[facility];  Service: Endoscopy;  Laterality: N/A;  1200   MASS EXCISION N/A 08/15/2017   Procedure: EXCISION SEBACEOUS CYST NECK;  Surgeon: Aviva Signs, MD;  Location: [facility] ORS;  Service: General;  Laterality: N/A;   PROSTATE SURGERY     RIGHT/LEFT HEART CATH AND CORONARY ANGIOGRAPHY N/A 04/30/2022   Procedure: RIGHT/LEFT HEART CATH AND CORONARY ANGIOGRAPHY;  Surgeon: Martinique, Peter M, MD;  Location: [place] CV LAB;  Service: Cardiovascular;  Laterality: N/A;   THYROIDECTOMY  2003   transurethral resection of thr prostate  2003   ureteral stone extraction  08/2011    Current Medications: Current Meds  Medication Sig   aspirin EC 81 MG tablet Take 1 tablet (81 mg total) by mouth daily.   cetirizine (ZYRTEC) 10 MG tablet TAKE 1 TABLET EVERY DAY   dapagliflozin propanediol (FARXIGA) 10 MG TABS tablet Take 1 tablet (10 mg total) by mouth daily before breakfast.   EPINEPHrine 0.3 mg/0.3 mL  IJ SOAJ injection Inject 0.3 mLs (0.3 mg total) into the muscle as needed for anaphylaxis.   levothyroxine (SYNTHROID) 75 MCG tablet Take 1 tablet (75 mcg total) by mouth daily.   losartan (COZAAR) 50 MG tablet Take 1 tablet (50 mg total) by mouth daily.   lovastatin (MEVACOR) 40 MG tablet TAKE 1 TABLET EVERY DAY   metoprolol succinate (TOPROL XL) 50 MG 24 hr tablet Take 1 tablet (50 mg total) by mouth daily. Take with or immediately following a meal.   Multiple Vitamin (MULTIVITAMIN WITH  MINERALS) TABS tablet Take 1 tablet by mouth daily.   oxybutynin (DITROPAN) 5 MG tablet TAKE 1 TABLET EVERY DAY   spironolactone (ALDACTONE) 25 MG tablet Take 0.5 tablets (12.5 mg total) by mouth daily.   tadalafil (CIALIS) 5 MG tablet Take 1 tablet (5 mg total) by mouth daily.   tamsulosin (FLOMAX) 0.4 MG CAPS capsule Take 1 capsule (0.4 mg total) by mouth daily.     Allergies:   Other, Peanut-containing drug products, Eggs or egg-derived products, Iodine, Shellfish allergy, and Penicillins   Social History   Socioeconomic History   Marital status: Married    Spouse name: Not on file   Number of children: 5   Years of education: Not on file   Highest education level: Not on file  Occupational History   Occupation: retired  Tobacco Use   Smoking status: Never    Passive exposure: Never   Smokeless tobacco: Never  Vaping Use   Vaping Use: Never used  Substance and Sexual Activity   Alcohol use: No    Alcohol/week: 0.0 standard drinks of alcohol   Drug use: No   Sexual activity: Yes  Other Topics Concern   Not on file  Social History Narrative   Not on file   Social Determinants of Health   Financial Resource Strain: Medium Risk (11/12/2021)   Overall Financial Resource Strain (CARDIA)    Difficulty of Paying Living Expenses: Somewhat hard  Food Insecurity: No Food Insecurity (06/24/2022)   Hunger Vital Sign    Worried About Running Out of Food in the Last Year: Never true    [place] in the Last Year: Never true  Transportation Needs: No Transportation Needs (06/24/2022)   PRAPARE - Hydrologist (Medical): No    Lack of Transportation (Non-Medical): No  Physical Activity: Insufficiently Active (06/24/2022)   Exercise Vital Sign    Days of Exercise per Week: 3 days    Minutes of Exercise per Session: 20 min  Stress: No Stress Concern Present (06/13/2021)   [place]     Feeling of Stress : Not at all  Social Connections: Moderately Integrated (06/24/2022)   Social Connection and Isolation Panel [NHANES]    Frequency of Communication with Friends and Family: More than three times a week    Frequency of Social Gatherings with Friends and Family: More than three times a week    Attends Religious Services: More than 4 times per year    Active Member of [employer] or Organizations: No    Attends Music therapist: Never    Marital Status: Married     Family History: The patient's family history includes Diabetes in his brother, brother, and father; Gout in his brother; Hypertension in his father; Kidney disease in his brother; Stroke in his brother and mother.  ROS:   Please see the history of present illness.  All other systems reviewed and are negative.  EKGs/Labs/Other Studies Reviewed:    The following studies were reviewed today:   EKG:   04/24/22: Sinus rhythm, rate 84, frequent PVCs, inferior Q waves, poor R wave progression, left axis deviation 02/07/22:  frequent PVCs, inferior Q waves, poor R wave progression, sinus rhythm, rate 92.  Recent Labs: 02/07/2022: TSH 3.780 04/24/2022: ALT 6 07/25/2022: BUN 19; Creatinine, Ser 1.21; Hemoglobin 14.8; Magnesium 2.3; Platelets 176; Potassium 4.5; Sodium 142  Recent Lipid Panel    Component Value Date/Time   CHOL 146 02/07/2022 1043   TRIG 66 02/07/2022 1043   HDL 70 02/07/2022 1043   CHOLHDL 2.1 02/07/2022 1043   CHOLHDL 2.2 04/03/2020 1122   VLDL 15 01/06/2017 1416   LDLCALC 63 02/07/2022 1043   LDLCALC 64 04/03/2020 1122    Physical Exam:    VS:  BP 110/70 (BP Location: Right Arm, Patient Position: Sitting, Cuff Size: Normal)   Pulse 72   Ht '5\' 7"'$  (1.702 m)   Wt 149 lb (67.6 kg)   SpO2 100%   BMI 23.34 kg/m     Wt Readings from Last 3 Encounters:  07/25/22 149 lb (67.6 kg)  05/09/22 151 lb (68.5 kg)  04/30/22 153 lb (69.4 kg)     GEN:   in no acute distress HEENT:  Normal NECK: No JVD; No carotid bruits CARDIAC: Irregular, normal rate, no murmurs, rubs, gallops RESPIRATORY:  Clear to auscultation without rales, wheezing or rhonchi  ABDOMEN: Soft, non-tender, non-distended MUSCULOSKELETAL:  1+ LLE edema SKIN: Warm and dry NEUROLOGIC:  Alert and oriented x 3 PSYCHIATRIC:  Normal affect   ASSESSMENT:    1. Chronic combined systolic (congestive) and diastolic (congestive) heart failure ([place])   2. Frequent PVCs   3. Essential hypertension   4. Hyperlipidemia, unspecified hyperlipidemia type   5. Edema of left lower extremity       PLAN:    Chronic combined heart failure: Echocardiogram 04/18/2022 showed EF 25 to 28%, grade 1 diastolic dysfunction, normal RV function, no significant valvular disease.  Cath on 05/01/2022 showed normal coronary arteries, normal filling pressures (RA 1, RV 19/0, PA 19/5/11, PCWP 4, LVEDP 11, CI 2.5).  -Recommend cardiac MRI for evaluation of nonischemic cardiomyopathy, scheduled for 10/7 -Recommend referral to EP given frequent PVCs, which could be cause of cardiomyopathy -Continue Toprol-XL 50 mg daily -Reports he is unable to afford Entresto.  Encouraged him to fill out financial assistance paperwork, which he has been given but has not filled out.  In the meantime switched to losartan 50 mg daily -Reports he has not been able to afford [place].  He has applied for financial assistance.  In the meantime have provided samples -Continue spironolactone 12.5 mg daily -Check BMET/magnesium  Frequent PVCs: Zio patch x4 days on 04/17/2022 showed 191 episodes of NSVT (longest lasting 7 beats), 8 episodes of SVT (longest lasting 14 seconds with rate 155 bpm), very frequent PVCs (24% of beats) and occasional ventricular couplets (3.5%). -Continue Toprol-XL.  Plan referral to EP as above  Hypertension: Continue Toprol-XL, spironolactone, losartan.  Appears controlled  Hyperlipidemia: On lovastatin 40 mg daily.  LDL 63 on  02/07/2022.  Left lower extremity edema: Asymmetric left lower extremity swelling, lower extremity duplex showed no DVT on 04/24/2022.  Normal filling pressures on RHC as above.  Suspect venous insufficiency   RTC in 3 months  Medication Adjustments/Labs and Tests Ordered: Current medicines are reviewed at length with the patient today.  Concerns regarding  medicines are outlined above.  Orders Placed This Encounter  Procedures   Basic metabolic panel   Magnesium   CBC   Ambulatory referral to Cardiac Electrophysiology   No orders of the defined types were placed in this encounter.   Patient Instructions  Medication Instructions:  Your physician recommends that you continue on your current medications as directed. Please refer to the Current Medication list given to you today.   Labwork: Today: -BMET -CBC -MAG  Testing/Procedures: None  Follow-Up: Follow up with Dr. Dellia Cloud in 3 months.   Any Other Special Instructions Will Be Listed Below (If Applicable).  You have been referred to EP.    If you need a refill on your cardiac medications before your next appointment, please call your pharmacy.    Signed, Donato Heinz, MD  07/25/2022 5:58 PM    [place]

## 2022-07-25 ENCOUNTER — Other Ambulatory Visit (HOSPITAL_COMMUNITY)
Admission: RE | Admit: 2022-07-25 | Discharge: 2022-07-25 | Disposition: A | Discharge status: Home / Self Care | Payer: Medicare HMO | Source: Ambulatory Visit | Attending: Cardiology | Admitting: Cardiology

## 2022-07-25 ENCOUNTER — Ambulatory Visit: Payer: Medicare HMO | Attending: Cardiology | Admitting: Cardiology

## 2022-07-25 ENCOUNTER — Encounter: Payer: Self-pay | Admitting: Cardiology

## 2022-07-25 VITALS — BP 110/70 | HR 72 | Ht 67.0 in | Wt 149.0 lb

## 2022-07-25 DIAGNOSIS — I5041 Acute combined systolic (congestive) and diastolic (congestive) heart failure: Secondary | ICD-10-CM | POA: Diagnosis not present

## 2022-07-25 DIAGNOSIS — I10 Essential (primary) hypertension: Secondary | ICD-10-CM | POA: Diagnosis not present

## 2022-07-25 DIAGNOSIS — I5042 Chronic combined systolic (congestive) and diastolic (congestive) heart failure: Secondary | ICD-10-CM | POA: Diagnosis not present

## 2022-07-25 DIAGNOSIS — I493 Ventricular premature depolarization: Secondary | ICD-10-CM

## 2022-07-25 DIAGNOSIS — E785 Hyperlipidemia, unspecified: Secondary | ICD-10-CM

## 2022-07-25 DIAGNOSIS — R600 Localized edema: Secondary | ICD-10-CM | POA: Diagnosis not present

## 2022-07-25 LAB — CBC
HCT: 46.9 % (ref 39.0–52.0)
Hemoglobin: 14.8 g/dL (ref 13.0–17.0)
MCH: 27.7 pg (ref 26.0–34.0)
MCHC: 31.6 g/dL (ref 30.0–36.0)
MCV: 87.8 fL (ref 80.0–100.0)
Platelets: 176 10*3/uL (ref 150–400)
RBC: 5.34 MIL/uL (ref 4.22–5.81)
RDW: 14.9 % (ref 11.5–15.5)
WBC: 4.1 10*3/uL (ref 4.0–10.5)
nRBC: 0 % (ref 0.0–0.2)

## 2022-07-25 LAB — BASIC METABOLIC PANEL
Anion gap: 7 (ref 5–15)
BUN: 19 mg/dL (ref 8–23)
CO2: 29 mmol/L (ref 22–32)
Calcium: 9.4 mg/dL (ref 8.9–10.3)
Chloride: 106 mmol/L (ref 98–111)
Creatinine, Ser: 1.21 mg/dL (ref 0.61–1.24)
GFR, Estimated: 59 mL/min — ABNORMAL LOW (ref >60)
Glucose, Bld: 124 mg/dL — ABNORMAL HIGH (ref 70–99)
Potassium: 4.5 mmol/L (ref 3.5–5.1)
Sodium: 142 mmol/L (ref 135–145)

## 2022-07-25 LAB — MAGNESIUM: Magnesium: 2.3 mg/dL (ref 1.7–2.4)

## 2022-07-25 NOTE — Patient Instructions (Addendum)
Medication Instructions:  Your physician recommends that you continue on your current medications as directed. Please refer to the Current Medication list given to you today.   Labwork: Today: -BMET -CBC -MAG  Testing/Procedures: None  Follow-Up: Follow up with Dr. Dellia Cloud in 3 months.   Any Other Special Instructions Will Be Listed Below (If Applicable).  You have been referred to EP.    If you need a refill on your cardiac medications before your next appointment, please call your pharmacy.

## 2022-07-26 ENCOUNTER — Encounter: Payer: Self-pay | Admitting: *Deleted

## 2022-07-29 ENCOUNTER — Telehealth (HOSPITAL_COMMUNITY): Payer: Self-pay | Admitting: Emergency Medicine

## 2022-07-29 NOTE — Telephone Encounter (Signed)
Attempted to call patient regarding upcoming cardiac MR appointment. Left message on voicemail with name and callback number Alesi Zachery RN Navigator Cardiac Imaging [facility] [phone] Office [phone] Cell  

## 2022-07-30 ENCOUNTER — Other Ambulatory Visit: Payer: Self-pay | Admitting: Cardiology

## 2022-07-30 ENCOUNTER — Ambulatory Visit (HOSPITAL_COMMUNITY)
Admission: RE | Admit: 2022-07-30 | Discharge: 2022-07-30 | Disposition: A | Discharge status: Home / Self Care | Payer: Medicare HMO | Source: Ambulatory Visit | Attending: Cardiology | Admitting: Cardiology

## 2022-07-30 DIAGNOSIS — I493 Ventricular premature depolarization: Secondary | ICD-10-CM | POA: Diagnosis not present

## 2022-07-30 DIAGNOSIS — I5041 Acute combined systolic (congestive) and diastolic (congestive) heart failure: Secondary | ICD-10-CM | POA: Insufficient documentation

## 2022-07-30 MED ORDER — GADOBUTROL 1 MMOL/ML IV SOLN
7.0000 mL | Freq: Once | INTRAVENOUS | Status: AC | PRN
Start: 2022-07-30 — End: 2022-07-30
  Administered 2022-07-30: 7 mL via INTRAVENOUS

## 2022-08-09 ENCOUNTER — Other Ambulatory Visit: Payer: Self-pay

## 2022-08-09 ENCOUNTER — Telehealth: Payer: Self-pay | Admitting: Cardiology

## 2022-08-09 DIAGNOSIS — N281 Cyst of kidney, acquired: Secondary | ICD-10-CM

## 2022-08-09 DIAGNOSIS — R911 Solitary pulmonary nodule: Secondary | ICD-10-CM

## 2022-08-09 DIAGNOSIS — B699 Cysticercosis, unspecified: Secondary | ICD-10-CM

## 2022-08-09 NOTE — Telephone Encounter (Signed)
Patient is requesting a call back to review MRI results.

## 2022-08-09 NOTE — Telephone Encounter (Addendum)
Explained to patient the results of CT per Dr. Gardiner Rhyme: "Heart pumping function remains moderately reduced.  Small amount of scar in the heart.  I am concerned that his heart pumping dysfunction is due to his frequent PVCs and recommended he see EP.  Also noted to have large kidney cysts and possible lung nodule.  Recommend CT chest/abdomen/pelvis for further evaluation." Orders placed for EP and CT chest/abdo/pelvis and informed patient he will be called to schedule each. Patient verbalized understanding. 4:17 PM: lab work order for [employer] mailed to patient's home.

## 2022-08-12 ENCOUNTER — Other Ambulatory Visit: Payer: Self-pay | Admitting: *Deleted

## 2022-08-16 ENCOUNTER — Other Ambulatory Visit: Payer: Self-pay | Admitting: Family Medicine

## 2022-08-29 ENCOUNTER — Encounter: Payer: Self-pay | Admitting: Cardiology

## 2022-08-29 ENCOUNTER — Ambulatory Visit (HOSPITAL_COMMUNITY): Payer: Medicare HMO

## 2022-08-30 ENCOUNTER — Telehealth: Payer: Self-pay | Admitting: Family Medicine

## 2022-08-30 NOTE — Telephone Encounter (Signed)
Farxiga 10 mg tab, 21 were prescribed by Cardiology with no refill so I am not certain he needs to be on this, he needs to contact Prescriber re the medication  He was given samples In July, needs to  contact Cardiology about that medication ,

## 2022-09-02 NOTE — Telephone Encounter (Signed)
Pt aware was in office when informed

## 2022-10-08 ENCOUNTER — Encounter: Payer: Self-pay | Admitting: Internal Medicine

## 2022-10-09 ENCOUNTER — Other Ambulatory Visit: Payer: Self-pay | Admitting: Family Medicine

## 2022-10-10 DIAGNOSIS — I10 Essential (primary) hypertension: Secondary | ICD-10-CM | POA: Diagnosis not present

## 2022-10-10 DIAGNOSIS — Z125 Encounter for screening for malignant neoplasm of prostate: Secondary | ICD-10-CM | POA: Diagnosis not present

## 2022-10-10 DIAGNOSIS — E039 Hypothyroidism, unspecified: Secondary | ICD-10-CM | POA: Diagnosis not present

## 2022-10-10 DIAGNOSIS — R7303 Prediabetes: Secondary | ICD-10-CM | POA: Diagnosis not present

## 2022-10-10 DIAGNOSIS — E559 Vitamin D deficiency, unspecified: Secondary | ICD-10-CM | POA: Diagnosis not present

## 2022-10-10 DIAGNOSIS — E785 Hyperlipidemia, unspecified: Secondary | ICD-10-CM | POA: Diagnosis not present

## 2022-10-11 ENCOUNTER — Ambulatory Visit (INDEPENDENT_AMBULATORY_CARE_PROVIDER_SITE_OTHER): Payer: Medicare HMO | Admitting: Family Medicine

## 2022-10-11 ENCOUNTER — Encounter: Payer: Self-pay | Admitting: Family Medicine

## 2022-10-11 VITALS — BP 130/80 | HR 98 | Resp 16 | Ht 67.0 in | Wt 149.0 lb

## 2022-10-11 DIAGNOSIS — M25551 Pain in right hip: Secondary | ICD-10-CM | POA: Diagnosis not present

## 2022-10-11 DIAGNOSIS — Z0000 Encounter for general adult medical examination without abnormal findings: Secondary | ICD-10-CM | POA: Diagnosis not present

## 2022-10-11 DIAGNOSIS — Z125 Encounter for screening for malignant neoplasm of prostate: Secondary | ICD-10-CM

## 2022-10-11 DIAGNOSIS — I5042 Chronic combined systolic (congestive) and diastolic (congestive) heart failure: Secondary | ICD-10-CM

## 2022-10-11 DIAGNOSIS — D126 Benign neoplasm of colon, unspecified: Secondary | ICD-10-CM

## 2022-10-11 LAB — CMP14+EGFR
ALT: 10 IU/L (ref 0–44)
AST: 14 IU/L (ref 0–40)
Albumin/Globulin Ratio: 1.6 (ref 1.2–2.2)
Albumin: 4.1 g/dL (ref 3.7–4.7)
Alkaline Phosphatase: 62 IU/L (ref 44–121)
BUN/Creatinine Ratio: 13 (ref 10–24)
BUN: 16 mg/dL (ref 8–27)
Bilirubin Total: 1 mg/dL (ref 0.0–1.2)
CO2: 25 mmol/L (ref 20–29)
Calcium: 9.3 mg/dL (ref 8.6–10.2)
Chloride: 103 mmol/L (ref 96–106)
Creatinine, Ser: 1.26 mg/dL (ref 0.76–1.27)
Globulin, Total: 2.5 g/dL (ref 1.5–4.5)
Glucose: 99 mg/dL (ref 70–99)
Potassium: 4.2 mmol/L (ref 3.5–5.2)
Sodium: 141 mmol/L (ref 134–144)
Total Protein: 6.6 g/dL (ref 6.0–8.5)
eGFR: 57 mL/min/{1.73_m2} — ABNORMAL LOW (ref >59)

## 2022-10-11 LAB — LIPID PANEL
Chol/HDL Ratio: 2.3 ratio (ref 0.0–5.0)
Cholesterol, Total: 154 mg/dL (ref 100–199)
HDL: 66 mg/dL (ref >39)
LDL Chol Calc (NIH): 75 mg/dL (ref 0–99)
Triglycerides: 64 mg/dL (ref 0–149)
VLDL Cholesterol Cal: 13 mg/dL (ref 5–40)

## 2022-10-11 LAB — HEMOGLOBIN A1C
Est. average glucose Bld gHb Est-mCnc: 131 mg/dL
Hgb A1c MFr Bld: 6.2 % — ABNORMAL HIGH (ref 4.8–5.6)

## 2022-10-11 LAB — VITAMIN D 25 HYDROXY (VIT D DEFICIENCY, FRACTURES): Vit D, 25-Hydroxy: 41.2 ng/mL (ref 30.0–100.0)

## 2022-10-11 LAB — TSH: TSH: 4.18 u[IU]/mL (ref 0.450–4.500)

## 2022-10-11 NOTE — Assessment & Plan Note (Signed)
Progressi\vely worsening with instability, refer Ortho

## 2022-10-11 NOTE — Assessment & Plan Note (Signed)

## 2022-10-11 NOTE — Patient Instructions (Addendum)
F/u in 6  months, call if you need me sooner  You are referred to GI for colonoscopy which is overdue   Nurse pls add PSA to recent lab  Please get fasting lipid, cmp and EGFr, hBA1C and TSH 2 to 5 days before next appt in June  You are referred to Orthopedics , Dr Aline Brochure re worsening right hip pain and instability, important that you go  You do need these vaccines T aP and RSV  Nurse pls verify AND DOCUMENT most recent  covid vaccine , states he had this in paST 4 MONTHS  Please give farxiga samples if we have  Stop ditropan, if you have uncontrolled incontinence then re start, let me know, I will try safer medication for incomntinence , hoping it is covered by your ins  Thanks for choosing [facility], we consider it a privelige to serve you. Best for 2024!

## 2022-10-11 NOTE — Progress Notes (Signed)
   [name]     MRN: [id number]      DOB: [birth year]   HPI: Patient is in for annual physical exam. C/o increased right hip pain and instability, will have Ortho eval Recent labs, if available are reviewed. Immunization is reviewed , and  updated if needed.    PE; BP 130/80   Pulse 98   Resp 16   Ht '5\' 7"'$  (1.702 m)   Wt 149 lb (67.6 kg)   SpO2 98%   BMI 23.34 kg/m   Pleasant male, alert and oriented x 3, in no cardio-pulmonary distress. Afebrile. HEENT No facial trauma or asymetry. Sinuses non tender. EOMI External ears normal,  Neck: supple, no adenopathy,JVD or thyromegaly.No bruits.  Chest: Clear to ascultation bilaterally.No crackles or wheezes. Non tender to palpation  Cardiovascular system; Heart sounds normal,  S1 and  S2 ,no S3.  Systolic  murmur, .Irregular piulse Apical beat not displaced   Abdomen: Soft, non tender, no organomegaly or masses. No bruits. Bowel sounds normal. No guarding, tenderness or rebound.    Musculoskeletal exam: Decreased  ROM of spine and  right hip ,  adequate in shoulders and knees. Mild deformity ,and  crepitus noted. No muscle wasting or atrophy.   Neurologic: Cranial nerves 2 to 12 intact. Power, tone ,sensation  normal throughout.  disturbance in gait. No tremor.  Skin: Intact, no ulceration, erythema , scaling or rash noted. Pigmentation normal throughout  Psych; Normal mood and affect. Judgement and concentration normal   Assessment & Plan:  Annual physical exam Annual exam as documented. Counseling done  re healthy lifestyle involving commitment to 150 minutes exercise per week, heart healthy diet, and attaining healthy weight.The importance of adequate sleep also discussed. Regular seat belt use and home safety, is also discussed. Changes in health habits are decided on by the patient with goals and time frames  set for achieving them. Immunization and cancer screening needs are specifically  addressed at this visit.   Right hip pain Progressi\vely worsening with instability, refer Ortho

## 2022-10-17 ENCOUNTER — Encounter: Payer: Self-pay | Admitting: Family Medicine

## 2022-10-17 LAB — SPECIMEN STATUS REPORT

## 2022-10-17 LAB — PSA, TOTAL AND FREE
PSA, Free Pct: 20 %
PSA, Free: 0.02 ng/mL
Prostate Specific Ag, Serum: 0.1 ng/mL (ref 0.0–4.0)

## 2022-10-18 ENCOUNTER — Other Ambulatory Visit: Payer: Self-pay | Admitting: Family Medicine

## 2022-10-21 ENCOUNTER — Telehealth: Payer: Self-pay | Admitting: *Deleted

## 2022-10-21 NOTE — Telephone Encounter (Signed)
Pt in office requesting samples of Farxiga 10 mg. Pt given 2 sample boxes Lot# [id number], EXP: 06/13/2025.

## 2022-10-22 ENCOUNTER — Other Ambulatory Visit: Payer: Self-pay

## 2022-10-22 MED ORDER — TAMSULOSIN HCL 0.4 MG PO CAPS: 0.4000 mg | ORAL_CAPSULE | Freq: Every day | ORAL | 5 refills | Status: DC

## 2022-10-24 ENCOUNTER — Encounter (INDEPENDENT_AMBULATORY_CARE_PROVIDER_SITE_OTHER): Payer: Self-pay | Admitting: *Deleted

## 2022-10-25 ENCOUNTER — Encounter: Payer: Self-pay | Admitting: Internal Medicine

## 2022-10-25 ENCOUNTER — Ambulatory Visit: Payer: Medicare HMO | Admitting: Internal Medicine

## 2022-10-25 ENCOUNTER — Ambulatory Visit: Payer: PPO | Attending: Internal Medicine | Admitting: Internal Medicine

## 2022-10-25 ENCOUNTER — Ambulatory Visit: Payer: PPO | Attending: Internal Medicine

## 2022-10-25 VITALS — BP 120/62 | HR 51 | Ht 67.0 in | Wt 148.0 lb

## 2022-10-25 DIAGNOSIS — I493 Ventricular premature depolarization: Secondary | ICD-10-CM | POA: Diagnosis not present

## 2022-10-25 DIAGNOSIS — I5022 Chronic systolic (congestive) heart failure: Secondary | ICD-10-CM | POA: Diagnosis not present

## 2022-10-25 DIAGNOSIS — I10 Essential (primary) hypertension: Secondary | ICD-10-CM

## 2022-10-25 NOTE — Patient Instructions (Signed)
Medication Instructions:  Your physician recommends that you continue on your current medications as directed. Please refer to the Current Medication list given to you today.  *If you need a refill on your cardiac medications before your next appointment, please call your pharmacy*   Lab Work: NONE   If you have labs (blood work) drawn today and your tests are completely normal, you will receive your results only by: [place] (if you have MyChart) OR A paper copy in the mail If you have any lab test that is abnormal or we need to change your treatment, we will call you to review the results.   Testing/Procedures: Your physician has requested that you have an echocardiogram. Echocardiography is a painless test that uses sound waves to create images of your heart. It provides your doctor with information about the size and shape of your heart and how well your heart's chambers and valves are working. This procedure takes approximately one hour. There are no restrictions for this procedure. Please do NOT wear cologne, perfume, aftershave, or lotions (deodorant is allowed). Please arrive 15 minutes prior to your appointment time.  ZIO XT- Long Term Monitor Instructions  Your physician has requested you wear a ZIO patch monitor for 14 days.  This is a single patch monitor. [employer] supplies one patch monitor per enrollment. Additional stickers are not available. Please do not apply patch if you will be having a Nuclear Stress Test,  Echocardiogram, Cardiac CT, MRI, or Chest Xray during the period you would be wearing the  monitor. The patch cannot be worn during these tests. You cannot remove and re-apply the  ZIO XT patch monitor.  Your ZIO patch monitor will be mailed 3 day USPS to your address on file. It may take 3-5 days  to receive your monitor after you have been enrolled.  Once you have received your monitor, please review the enclosed instructions. Your monitor  has already  been registered assigning a specific monitor serial # to you.  Billing and Patient Assistance Program Information  We have supplied [employer] with any of your insurance information on file for billing purposes. [employer] offers a sliding scale Patient Assistance Program for patients that do not have  insurance, or whose insurance does not completely cover the cost of the ZIO monitor.  You must apply for the Patient Assistance Program to qualify for this discounted rate.  To apply, please call [employer] at [phone], select option 4, select option 2, ask to apply for  Patient Assistance Program. Garen Demark will ask your household income, and how many people  are in your household. They will quote your out-of-pocket cost based on that information.  [employer] will also be able to set up a 65-month interest-free payment plan if needed.  Applying the monitor   Shave hair from upper left chest.  Hold abrader disc by orange tab. Rub abrader in 40 strokes over the upper left chest as  indicated in your monitor instructions.  Clean area with 4 enclosed alcohol pads. Let dry.  Apply patch as indicated in monitor instructions. Patch will be placed under collarbone on left  side of chest with arrow pointing upward.  Rub patch adhesive wings for 2 minutes. Remove white label marked "1". Remove the white  label marked "2". Rub patch adhesive wings for 2 additional minutes.  While looking in a mirror, press and release button in center of patch. A small green light will  flash 3-4 times. This will be your only  indicator that the monitor has been turned on.  Do not shower for the first 24 hours. You may shower after the first 24 hours.  Press the button if you feel a symptom. You will hear a small click. Record Date, Time and  Symptom in the Patient Logbook.  When you are ready to remove the patch, follow instructions on the last 2 pages of Patient  Logbook. Stick patch monitor onto the last page of Patient  Logbook.  Place Patient Logbook in the blue and white box. Use locking tab on box and tape box closed  securely. The blue and white box has prepaid postage on it. Please place it in the mailbox as  soon as possible. Your physician should have your test results approximately 7 days after the  monitor has been mailed back to [facility].  Call [place] at [phone] if you have questions regarding  your ZIO XT patch monitor. Call them immediately if you see an orange light blinking on your  monitor.  If your monitor falls off in less than 4 days, contact our Monitor department at [phone].  If your monitor becomes loose or falls off after 4 days call [employer] at [phone] for  suggestions on securing your monitor    Follow-Up: At [facility], you and your health needs are our priority.  As part of our continuing mission to provide you with exceptional heart care, we have created designated Provider Care Teams.  These Care Teams include your primary Cardiologist (physician) and Advanced Practice Providers (APPs -  Physician Assistants and Nurse Practitioners) who all work together to provide you with the care you need, when you need it.  We recommend signing up for the patient portal called "MyChart".  Sign up information is provided on this After Visit Summary.  MyChart is used to connect with patients for Virtual Visits (Telemedicine).  Patients are able to view lab/test results, encounter notes, upcoming appointments, etc.  Non-urgent messages can be sent to your provider as well.   To learn more about what you can do with MyChart, go to [email or web address].    Your next appointment:   3 month(s)  Provider:   Claudina Lick, MD    Other Instructions Thank you for choosing [facility]!

## 2022-10-27 DIAGNOSIS — I493 Ventricular premature depolarization: Secondary | ICD-10-CM | POA: Insufficient documentation

## 2022-10-27 NOTE — Progress Notes (Signed)
Cardiology Office Note  Date: 10/27/2022   ID: [name], [name] 04/08/1941, MRN [id number]  PCP:  Paul Helper, MD  Cardiologist:  Paul Heinz, MD Electrophysiologist:  None   Reason for Office Visit: Follow-up of PVCs and cardiomyopathy   History of Present Illness: [name] is a 82 y.o. 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 He is currently on GDMT and tolerating all medications well with no side effects. He is physically active at baseline, works around the house and does DIY projects with no symptoms. Denies any angina, SOB, dizziness, lightheadedness, leg swelling. Although, he does report having palpitations most of the time.  Past Medical History:  Diagnosis Date   Allergic rhinitis, seasonal    BPH (benign prostatic hypertrophy)    Erectile dysfunction    History of kidney stones    Hx of thyroidectomy    Hyperlipidemia    Hypertension    Hypothyroidism    Pre-diabetes    Prostate cancer ([place]) 2011   treated with radiation   Urosepsis 07/2011   ICU admission [facility]    Past Surgical History:  Procedure Laterality Date   COLONOSCOPY N/A 09/03/2013   Procedure: COLONOSCOPY;  Surgeon: Rogene Houston, MD;  Location: [facility];  Service: Endoscopy;  Laterality: N/A;  1225   COLONOSCOPY N/A 11/08/2015   Procedure: COLONOSCOPY;  Surgeon: Rogene Houston, MD;  Location: [facility];  Service: Endoscopy;  Laterality: N/A;  1200   MASS EXCISION N/A 08/15/2017   Procedure: EXCISION SEBACEOUS CYST NECK;  Surgeon: Aviva Signs, MD;  Location: [facility] ORS;  Service: General;  Laterality: N/A;   PROSTATE SURGERY     RIGHT/LEFT HEART CATH AND CORONARY ANGIOGRAPHY N/A 04/30/2022   Procedure: RIGHT/LEFT  HEART CATH AND CORONARY ANGIOGRAPHY;  Surgeon: Martinique, Peter M, MD;  Location: [facility];  Service: Cardiovascular;  Laterality: N/A;   THYROIDECTOMY  2003   transurethral resection of thr prostate  2003   ureteral stone extraction  08/2011    Current Outpatient Medications  Medication Sig Dispense Refill   amLODipine (NORVASC) 10 MG tablet TAKE ONE TABLET BY MOUTH ONCE DAILY. 90 tablet 0   aspirin EC 81 MG tablet Take 1 tablet (81 mg total) by mouth daily. 30 tablet 5   cetirizine (ZYRTEC) 10 MG tablet TAKE 1 TABLET EVERY DAY 90 tablet 10   dapagliflozin propanediol (FARXIGA) 10 MG TABS tablet Take 1 tablet (10 mg total) by mouth daily before breakfast. 21 tablet 0   EPINEPHrine 0.3 mg/0.3 mL IJ SOAJ injection Inject 0.3 mLs (0.3 mg total) into the muscle as needed for anaphylaxis. 1 each 2   levothyroxine (SYNTHROID) 75 MCG tablet Take 1 tablet (75 mcg total) by mouth daily. 90 tablet 1   losartan (COZAAR) 50 MG tablet Take 1 tablet (50 mg total) by mouth daily. 90 tablet 3   lovastatin (MEVACOR) 40 MG tablet TAKE 1 TABLET EVERY DAY 90 tablet 0   metoprolol succinate (TOPROL XL) 50 MG 24 hr tablet Take 1 tablet (50 mg total) by mouth daily. Take with or immediately following a meal. 90 tablet 3   Multiple Vitamin (MULTIVITAMIN WITH MINERALS) TABS tablet Take 1 tablet by mouth daily.     spironolactone (ALDACTONE) 25 MG tablet Take 0.5 tablets (12.5  mg total) by mouth daily. 45 tablet 3   tadalafil (CIALIS) 5 MG tablet Take 1 tablet (5 mg total) by mouth daily. 90 tablet 3   tamsulosin (FLOMAX) 0.4 MG CAPS capsule Take 1 capsule (0.4 mg total) by mouth daily. 30 capsule 5   No current facility-administered medications for this visit.   Allergies:  Other, Peanut-containing drug products, Eggs or egg-derived products, Iodine, Shellfish allergy, and Penicillins   Social History: The patient  reports that he has never smoked. He has never been exposed to tobacco smoke. He has never  used smokeless tobacco. He reports that he does not drink alcohol and does not use drugs.   Family History: The patient's family history includes Diabetes in his brother, brother, and father; Gout in his brother; Hypertension in his father; Kidney disease in his brother; Stroke in his brother and mother.   ROS:  Please see the history of present illness. Otherwise, complete review of systems is positive for none.  All other systems are reviewed and negative.   Physical Exam: VS:  BP 120/62   Pulse (!) 51   Ht '5\' 7"'$  (1.702 m)   Wt 148 lb (67.1 kg)   SpO2 99%   BMI 23.18 kg/m , BMI Body mass index is 23.18 kg/m.  Wt Readings from Last 3 Encounters:  10/25/22 148 lb (67.1 kg)  10/11/22 149 lb (67.6 kg)  07/25/22 149 lb (67.6 kg)    General: Patient appears comfortable at rest. HEENT: Conjunctiva and lids normal, oropharynx clear with moist mucosa. Neck: Supple, no elevated JVP or carotid bruits, no thyromegaly. Lungs: Clear to auscultation, nonlabored breathing at rest. Cardiac: Regular rate and rhythm, no S3 or significant systolic murmur, no pericardial rub. Abdomen: Soft, nontender, no hepatomegaly, bowel sounds present, no guarding or rebound. Extremities: No pitting edema, distal pulses 2+. Skin: Warm and dry. Musculoskeletal: No kyphosis. Neuropsychiatric: Alert and oriented x3, affect grossly appropriate.  ECG:  NSR and PVC on 10/25/22  Recent Labwork: 07/25/2022: Hemoglobin 14.8; Magnesium 2.3; Platelets 176 10/10/2022: ALT 10; AST 14; BUN 16; Creatinine, Ser 1.26; Potassium 4.2; Sodium 141; TSH 4.180     Component Value Date/Time   CHOL 154 10/10/2022 1143   TRIG 64 10/10/2022 1143   HDL 66 10/10/2022 1143   CHOLHDL 2.3 10/10/2022 1143   CHOLHDL 2.2 04/03/2020 1122   VLDL 15 01/06/2017 1416   LDLCALC 75 10/10/2022 1143   LDLCALC 64 04/03/2020 1122    Other Studies Reviewed Today:   Assessment and Plan:  Patient is 82 year old M known to have NICM LVEF 25 to  30% in 04/2022 ([facility] showed angiographically normal coronaries/normal filling pressures and 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  # NICM LVEF 25 to 30% in 04/2022 -[facility] in 7/23 showed angiographically normal coronaries and normal filling pressures.  Cardiomyopathy could likely be secondary to significant PVC burden, 24%. -Continue metoprolol succinate 50 mg once daily -Continue losartan 50 mg once daily -Continue spironolactone 12.5 mg once daily -Continue Farxiga 10 mg once daily -Patient has been on GDMT for more than 3 months and will obtain limited echocardiogram for LVEF assessment (due to improvement in PVC burden as well based on the EKG today).  # PVC burden 24% in 7/23, symptomatic -Continue metoprolol succinate 50 mg once daily -EKG today showed normal sinus rhythm and PVC (in 7/23 EKG showed frequent PVCs which has significantly improved on today's EKG). Will  obtain 1 week event monitor to quantify PVC burden. If still high, will increase the dose of metoprolol and refer to EP.  I have spent a total of 33 minutes with patient reviewing chart, EKGs, labs and examining patient as well as establishing an assessment and plan that was discussed with the patient.  > 50% of time was spent in direct patient care.     Medication Adjustments/Labs and Tests Ordered: Current medicines are reviewed at length with the patient today.  Concerns regarding medicines are outlined above.   Tests Ordered: Orders Placed This Encounter  Procedures   LONG TERM MONITOR (3-14 DAYS)   EKG 12-Lead   ECHOCARDIOGRAM LIMITED    Medication Changes: No orders of the defined types were placed in this encounter.   Disposition:  Follow up  3 months  Signed, Nasario Czerniak Fidel Levy, MD, 10/27/2022 5:48 AM    [facility] [address], [place],  [place] [address]

## 2022-11-05 DIAGNOSIS — I493 Ventricular premature depolarization: Secondary | ICD-10-CM | POA: Diagnosis not present

## 2022-11-07 ENCOUNTER — Ambulatory Visit (HOSPITAL_COMMUNITY)
Admission: RE | Admit: 2022-11-07 | Discharge: 2022-11-07 | Disposition: A | Discharge status: Home / Self Care | Payer: PPO | Source: Ambulatory Visit | Attending: Internal Medicine | Admitting: Internal Medicine

## 2022-11-07 ENCOUNTER — Telehealth: Payer: Self-pay

## 2022-11-07 DIAGNOSIS — I5022 Chronic systolic (congestive) heart failure: Secondary | ICD-10-CM | POA: Insufficient documentation

## 2022-11-07 LAB — ECHOCARDIOGRAM LIMITED
Calc EF: 33.1 %
S' Lateral: 4.45 cm
Single Plane A2C EF: 33.1 %
Single Plane A4C EF: 35 %

## 2022-11-07 NOTE — Progress Notes (Signed)
  Chronic Care Management   Note  11/07/2022 Name: [name] MRN: [id number] DOB: 1941/07/26  [name] is a 82 y.o. year old male who is a primary care patient of Paul Cipro Norwood Levo, MD. I reached out to [name] by phone today in response to a referral sent by [name]'s PCP.  The first contact attempt was unsuccessful.   Follow up plan: Additional outreach attempts will be made.  [name], [name], [name] [id number] Direct Dial: [phone] [name]

## 2022-11-07 NOTE — Progress Notes (Signed)
*  PRELIMINARY RESULTS* Echocardiogram Limited 2-D Echocardiogram  has been performed.  [name] 11/07/2022, 12:10 PM

## 2022-11-08 ENCOUNTER — Other Ambulatory Visit: Payer: Self-pay

## 2022-11-08 DIAGNOSIS — I493 Ventricular premature depolarization: Secondary | ICD-10-CM

## 2022-11-08 NOTE — Progress Notes (Signed)
  Chronic Care Management   Note  11/08/2022 Name: [name] MRN: [id number] DOB: [birth year]  [name] is a 82 y.o. year old male who is a primary care patient of Moshe Cipro Norwood Levo, MD. I reached out to Burnard Bunting by phone today in response to a referral sent by [name]'s PCP.  [name] was given information about Chronic Care Management services today including:  CCM service includes personalized support from designated clinical staff supervised by the physician, including individualized plan of care and coordination with other care providers 24/7 contact phone numbers for assistance for urgent and routine care needs. Service will only be billed when office clinical staff spend 20 minutes or more in a month to coordinate care. Only one practitioner may furnish and bill the service in a calendar month. The patient may stop CCM services at amy time (effective at the end of the month) by phone call to the office staff. The patient will be responsible for cost sharing (co-pay) or up to 20% of the service fee (after annual deductible is met)  [name]  agreedto scheduling an appointment with the CCM RN Case Manager   Follow up plan: Patient agreed to scheduled appointment with RN Case Manager on 11/11/2022(date/time).   Noreene Larsson, [place], [place] [address] Direct Dial: [phone] [email or web address]

## 2022-11-08 NOTE — Progress Notes (Signed)
   Care Guide Note  11/08/2022 Name: [name] MRN: [id number] DOB: 06-04-1941  Referred by: Fayrene Helper, MD Reason for referral : Chronic Care Management (Outreach to schedule referral )   [name] is a 82 y.o. year old male who is a primary care patient of Fayrene Helper, MD. [name] was referred to the pharmacist for assistance related to HTN.    Successful contact was made with the patient to discuss pharmacy services including being ready for the pharmacist to call at least 5 minutes before the scheduled appointment time, to have medication bottles and any blood sugar or blood pressure readings ready for review. The patient agreed to meet with the pharmacist via with the pharmacist via telephone visit on (date/time).  11/13/2022  Noreene Larsson, [place], [place] [address] Direct Dial: [phone] [email or web address]

## 2022-11-11 ENCOUNTER — Ambulatory Visit (INDEPENDENT_AMBULATORY_CARE_PROVIDER_SITE_OTHER): Payer: PPO | Admitting: *Deleted

## 2022-11-11 DIAGNOSIS — I5022 Chronic systolic (congestive) heart failure: Secondary | ICD-10-CM

## 2022-11-11 DIAGNOSIS — I10 Essential (primary) hypertension: Secondary | ICD-10-CM

## 2022-11-11 NOTE — Plan of Care (Signed)
Chronic Care Management Provider Comprehensive Care Plan    11/11/2022 Name: [name] MRN: [id number] DOB: 18-Jan-1941  Referral to Chronic Care Management (CCM) services was placed by Provider:  Tula Nakayama MD  on Date: 10/11/22.  Chronic Condition 1: HYPERTENSION Provider Assessment and Plan Hypertension: Continue Toprol-XL, spironolactone, losartan.  Appears controlled    Expected Outcome/Goals Addressed This Visit (Provider CCM goals/Provider Assessment and plan  CCM (HYPERTENSION) EXPECTED OUTCOME: MONITOR, SELF-MANAGE AND REDUCE SYMPTOMS OF HYPERTENSION  Symptom Management Condition 1: Take medications as prescribed   Attend all scheduled provider appointments Call pharmacy for medication refills 3-7 days in advance of running out of medications Attend church or other social activities Perform all self care activities independently  Perform IADL's (shopping, preparing meals, housekeeping, managing finances) independently Call provider office for new concerns or questions  check blood pressure 3 times per week choose a place to take my blood pressure (home, clinic or office, retail store) write blood pressure results in a log or diary learn about high blood pressure keep a blood pressure log take blood pressure log to all doctor appointments keep all doctor appointments take medications for blood pressure exactly as prescribed report new symptoms to your doctor eat more whole grains, fruits and vegetables, lean meats and healthy fats Look over education mailed- low sodium diet  Chronic Condition 2: CONGESTIVE HEART FAILURE Provider Assessment and Plan Chronic combined heart failure: Echocardiogram 04/18/2022 showed EF 25 to 53%, grade 1 diastolic dysfunction, normal RV function, no significant valvular disease.  Cath on 05/01/2022 showed normal coronary arteries, normal filling pressures (RA 1, RV 19/0, PA 19/5/11, PCWP 4, LVEDP 11, CI 2.5).  -Recommend cardiac  MRI for evaluation of nonischemic cardiomyopathy, scheduled for 10/7 -Recommend referral to EP given frequent PVCs, which could be cause of cardiomyopathy -Continue Toprol-XL 50 mg daily -Reports he is unable to afford Entresto.  Encouraged him to fill out financial assistance paperwork, which he has been given but has not filled out.  In the meantime switched to losartan 50 mg daily -Reports he has not been able to afford [place].  He has applied for financial assistance.  In the meantime have provided samples -Continue spironolactone 12.5 mg daily -Check BMET/magnesium   Expected Outcome/Goals Addressed This Visit (Provider CCM goals/Provider Assessment and plan  CCM ([place]) EXPECTED OUTCOME: MONITOR, SELF-MANAGE AND REDUCE SYMPTOMS OF CONGESTIVE HEART FAILURE  Symptom Management Condition 2: Take medications as prescribed   Attend all scheduled provider appointments Call pharmacy for medication refills 3-7 days in advance of running out of medications Attend church or other social activities Perform all self care activities independently  Perform IADL's (shopping, preparing meals, housekeeping, managing finances) independently Call provider office for new concerns or questions  call office if I gain more than 2 pounds in one day or 5 pounds in one week track weight in diary use salt in moderation watch for swelling in feet, ankles and legs every day weigh myself daily develop a rescue plan follow rescue plan if symptoms flare-up eat more whole grains, fruits and vegetables, lean meats and healthy fats know when to call the doctor- if you are in the yellow zone for heart failure action plan dress right for the weather, hot or cold Look over Advanced directives mailed Education mailed- Heart Failure action plan  Problem List Patient Active Problem List   Diagnosis Date Noted   Frequent PVCs 61/44/3154   Chronic systolic CHF (congestive heart failure) ([place])  Right hip pain 02/07/2022   Low back pain radiating to lower extremity 02/07/2022   Fatigue due to excessive exertion 02/07/2022   Irregular heart rate 02/07/2022   Abnormal EKG 02/07/2022   Financial difficulties 10/07/2021   Nocturia more than twice per night 02/25/2017   Annual physical exam 01/08/2016   Tubular adenoma of colon 11/10/2015   OTHER URINARY INCONTINENCE 10/29/2010   Prediabetes 07/25/2010   PROSTATE CANCER 03/25/2010   Hypothyroidism 01/08/2008   Hyperlipidemia LDL goal <100 01/08/2008   ERECTILE DYSFUNCTION 01/08/2008   Essential hypertension 01/08/2008   ALLERGIC RHINITIS, SEASONAL 01/08/2008    Medication Management  Current Outpatient Medications:    amLODipine (NORVASC) 10 MG tablet, TAKE ONE TABLET BY MOUTH ONCE DAILY., Disp: 90 tablet, Rfl: 0   aspirin EC 81 MG tablet, Take 1 tablet (81 mg total) by mouth daily., Disp: 30 tablet, Rfl: 5   cetirizine (ZYRTEC) 10 MG tablet, TAKE 1 TABLET EVERY DAY, Disp: 90 tablet, Rfl: 10   dapagliflozin propanediol (FARXIGA) 10 MG TABS tablet, Take 1 tablet (10 mg total) by mouth daily before breakfast., Disp: 21 tablet, Rfl: 0   EPINEPHrine 0.3 mg/0.3 mL IJ SOAJ injection, Inject 0.3 mLs (0.3 mg total) into the muscle as needed for anaphylaxis., Disp: 1 each, Rfl: 2   levothyroxine (SYNTHROID) 75 MCG tablet, Take 1 tablet (75 mcg total) by mouth daily., Disp: 90 tablet, Rfl: 1   losartan (COZAAR) 50 MG tablet, Take 1 tablet (50 mg total) by mouth daily., Disp: 90 tablet, Rfl: 3   lovastatin (MEVACOR) 40 MG tablet, TAKE 1 TABLET EVERY DAY, Disp: 90 tablet, Rfl: 0   metoprolol succinate (TOPROL XL) 50 MG 24 hr tablet, Take 1 tablet (50 mg total) by mouth daily. Take with or immediately following a meal., Disp: 90 tablet, Rfl: 3   Multiple Vitamin (MULTIVITAMIN WITH MINERALS) TABS tablet, Take 1 tablet by mouth daily., Disp: , Rfl:    spironolactone (ALDACTONE) 25 MG tablet, Take 0.5 tablets (12.5 mg total) by mouth daily.,  Disp: 45 tablet, Rfl: 3   tadalafil (CIALIS) 5 MG tablet, Take 1 tablet (5 mg total) by mouth daily., Disp: 90 tablet, Rfl: 3   tamsulosin (FLOMAX) 0.4 MG CAPS capsule, Take 1 capsule (0.4 mg total) by mouth daily., Disp: 30 capsule, Rfl: 5  Cognitive Assessment Identity Confirmed: : DOB; Name Cognitive Status: Normal   Functional Assessment Hearing Difficulty or Deaf: no Wear Glasses or Blind: yes Vision Management: can see well w/ glasses Concentrating, Remembering or Making Decisions Difficulty (CP): no Difficulty Communicating: no Difficulty Eating/Swallowing: no Walking or Climbing Stairs Difficulty: no Dressing/Bathing Difficulty: no Doing Errands Independently Difficulty (such as shopping) (CP): no   Caregiver Assessment  Primary Source of Support/Comfort: spouse Name of Support/Comfort Primary Source: spouse [name] People in Home: spouse Name(s) of People in Home: spouse [name]   Planned Interventions  Basic overview and discussion of pathophysiology of Heart Failure reviewed Provided education on low sodium diet Reviewed Heart Failure Action Plan in depth and provided written copy Assessed need for readable accurate scales in home Provided education about placing scale on hard, flat surface Advised patient to weigh each morning after emptying bladder Discussed importance of daily weight and advised patient to weigh and record daily Discussed the importance of keeping all appointments with provider Provided patient with education about the role of exercise in the management of heart failure Screening for signs and symptoms of depression related to chronic disease state  Assessed social  determinant of health barriers Advanced directives mailed Evaluation of current treatment plan related to hypertension self management and patient's adherence to plan as established by provider;   Reviewed prescribed diet low sodium Reviewed medications with  patient and discussed importance of compliance;  Counseled on the importance of exercise goals with target of 150 minutes per week Discussed plans with patient for ongoing care management follow up and provided patient with direct contact information for care management team; Advised patient, providing education and rationale, to monitor blood pressure daily and record, calling PCP for findings outside established parameters;  Advised patient to discuss any issues with blood pressure, medications  with provider; Provided education on prescribed diet low sodium;  Discussed complications of poorly controlled blood pressure such as heart disease, stroke, circulatory complications, vision complications, kidney impairment, sexual dysfunction;  Screening for signs and symptoms of depression related to chronic disease state;  Assessed social determinant of health barriers;  Reviewed upcoming scheduled appointments  Interaction and coordination with outside resources, practitioners, and providers See CCM Referral  Care Plan: Printed and mailed to patient

## 2022-11-11 NOTE — Chronic Care Management (AMB) (Signed)
Chronic Care Management   CCM RN Visit Note  11/11/2022 Name: [name] No MRN: [id number] DOB: 09/02/1941  Subjective: [name] is a 82 y.o. year old male who is a primary care patient of Fayrene Helper, MD. The patient was referred to the Chronic Care Management team for assistance with care management needs subsequent to provider initiation of CCM services and plan of care.    Today's Visit:  Engaged with patient by telephone for initial visit.     SDOH Interventions Today    Flowsheet Row Most Recent Value  SDOH Interventions   Food Insecurity Interventions Intervention Not Indicated  Housing Interventions Intervention Not Indicated  Transportation Interventions Intervention Not Indicated  Utilities Interventions Intervention Not Indicated  Financial Strain Interventions Intervention Not Indicated  Physical Activity Interventions Intervention Not Indicated  Stress Interventions Intervention Not Indicated  Social Connections Interventions Intervention Not Indicated         Goals Addressed             This Visit's Progress    CCM (CONGESTIVE HEART FAILURE) EXPECTED OUTCOME: MONITOR, SELF-MANAGE AND REDUCE SYMPTOMS OF CONGESTIVE HEART FAILURE       Current Barriers:  Knowledge Deficits related to Heart Failure management Chronic Disease Management support and education needs related to Heart Failure, diet No Advanced Directives in place- pt requests documents be mailed Patient reports he has a scale and used to weigh consistently, does not weigh now, verbalizes understanding of importance of daily weights and keeping a log  Planned Interventions: Basic overview and discussion of pathophysiology of Heart Failure reviewed Provided education on low sodium diet Reviewed Heart Failure Action Plan in depth and provided written copy Assessed need for readable accurate scales in home Provided education about placing scale on hard, flat surface Advised  patient to weigh each morning after emptying bladder Discussed importance of daily weight and advised patient to weigh and record daily Discussed the importance of keeping all appointments with provider Provided patient with education about the role of exercise in the management of heart failure Screening for signs and symptoms of depression related to chronic disease state  Assessed social determinant of health barriers Advanced directives mailed  Symptom Management: Take medications as prescribed   Attend all scheduled provider appointments Call pharmacy for medication refills 3-7 days in advance of running out of medications Attend church or other social activities Perform all self care activities independently  Perform IADL's (shopping, preparing meals, housekeeping, managing finances) independently Call provider office for new concerns or questions  call office if I gain more than 2 pounds in one day or 5 pounds in one week track weight in diary use salt in moderation watch for swelling in feet, ankles and legs every day weigh myself daily develop a rescue plan follow rescue plan if symptoms flare-up eat more whole grains, fruits and vegetables, lean meats and healthy fats know when to call the doctor- if you are in the yellow zone for heart failure action plan dress right for the weather, hot or cold Look over Advanced directives mailed Education mailed- Heart Failure action plan  Follow Up Plan: Telephone follow up appointment with care management team member scheduled for: 01/07/23 at 130 pm       CCM (HYPERTENSION) EXPECTED OUTCOME: MONITOR, SELF-MANAGE AND REDUCE SYMPTOMS OF HYPERTENSION       Current Barriers:  Knowledge Deficits related to Hypertension management Chronic Disease Management support and education needs related to Hypertension, diet No Advanced Directives in  place- requests information be mailed Patient reports he lives with spouse, is independent in all  aspects of his care, continues to drive. Patient reports he checks blood pressure 2-3 x week Patient reports he has treadmill and walks on some during the week, stays busy around his house  Planned Interventions: Evaluation of current treatment plan related to hypertension self management and patient's adherence to plan as established by provider;   Reviewed prescribed diet low sodium Reviewed medications with patient and discussed importance of compliance;  Counseled on the importance of exercise goals with target of 150 minutes per week Discussed plans with patient for ongoing care management follow up and provided patient with direct contact information for care management team; Advised patient, providing education and rationale, to monitor blood pressure daily and record, calling PCP for findings outside established parameters;  Advised patient to discuss any issues with blood pressure, medications  with provider; Provided education on prescribed diet low sodium;  Discussed complications of poorly controlled blood pressure such as heart disease, stroke, circulatory complications, vision complications, kidney impairment, sexual dysfunction;  Screening for signs and symptoms of depression related to chronic disease state;  Assessed social determinant of health barriers;  Reviewed upcoming scheduled appointments  Symptom Management: Take medications as prescribed   Attend all scheduled provider appointments Call pharmacy for medication refills 3-7 days in advance of running out of medications Attend church or other social activities Perform all self care activities independently  Perform IADL's (shopping, preparing meals, housekeeping, managing finances) independently Call provider office for new concerns or questions  check blood pressure 3 times per week choose a place to take my blood pressure (home, clinic or office, retail store) write blood pressure results in a log or diary learn  about high blood pressure keep a blood pressure log take blood pressure log to all doctor appointments keep all doctor appointments take medications for blood pressure exactly as prescribed report new symptoms to your doctor eat more whole grains, fruits and vegetables, lean meats and healthy fats Look over education mailed- low sodium diet  Follow Up Plan: Telephone follow up appointment with care management team member scheduled for:  01/07/23 at 130 pm          Plan:Telephone follow up appointment with care management team member scheduled for:  01/07/23 at 130 pm  Jacqlyn Larsen [facility], BSN RN Case Manager [facility] [phone]

## 2022-11-11 NOTE — Patient Instructions (Signed)
Please call the care guide team at [phone] if you need to cancel or reschedule your appointment.   If you are experiencing a Mental Health or [place] or need someone to talk to, please call the Suicide and Crisis Lifeline: 988 call the [place] National Suicide Prevention Lifeline: 8034562333 or TTY: (816)627-6364 TTY 706-633-0845) to talk to a trained counselor call 1-800-273-TALK (toll free, 24 hour hotline) go to [facility] [address] [place] [phone]) call the [facility]: [phone] call 911   Following is a copy of the CCM Program Consent:  CCM service includes personalized support from designated clinical staff supervised by the physician, including individualized plan of care and coordination with other care providers 24/7 contact phone numbers for assistance for urgent and routine care needs. Service will only be billed when office clinical staff spend 20 minutes or more in a month to coordinate care. Only one practitioner may furnish and bill the service in a calendar month. The patient may stop CCM services at amy time (effective at the end of the month) by phone call to the office staff. The patient will be responsible for cost sharing (co-pay) or up to 20% of the service fee (after annual deductible is met)  Following is a copy of your full provider care plan:   Goals Addressed             This Visit's Progress    CCM ([place]) EXPECTED OUTCOME: MONITOR, SELF-MANAGE AND REDUCE SYMPTOMS OF CONGESTIVE HEART FAILURE       Current Barriers:  Knowledge Deficits related to Heart Failure management Chronic Disease Management support and education needs related to Heart Failure, diet No Advanced Directives in place- pt requests documents be mailed Patient reports he has a scale and used to weigh consistently, does not weigh now, verbalizes understanding of importance of  daily weights and keeping a log  Planned Interventions: Basic overview and discussion of pathophysiology of Heart Failure reviewed Provided education on low sodium diet Reviewed Heart Failure Action Plan in depth and provided written copy Assessed need for readable accurate scales in home Provided education about placing scale on hard, flat surface Advised patient to weigh each morning after emptying bladder Discussed importance of daily weight and advised patient to weigh and record daily Discussed the importance of keeping all appointments with provider Provided patient with education about the role of exercise in the management of heart failure Screening for signs and symptoms of depression related to chronic disease state  Assessed social determinant of health barriers Advanced directives mailed  Symptom Management: Take medications as prescribed   Attend all scheduled provider appointments Call pharmacy for medication refills 3-7 days in advance of running out of medications Attend church or other social activities Perform all self care activities independently  Perform IADL's (shopping, preparing meals, housekeeping, managing finances) independently Call provider office for new concerns or questions  call office if I gain more than 2 pounds in one day or 5 pounds in one week track weight in diary use salt in moderation watch for swelling in feet, ankles and legs every day weigh myself daily develop a rescue plan follow rescue plan if symptoms flare-up eat more whole grains, fruits and vegetables, lean meats and healthy fats know when to call the doctor- if you are in the yellow zone for heart failure action plan dress right for the weather, hot or cold Look over Advanced directives mailed Education mailed- Heart  Failure action plan  Follow Up Plan: Telephone follow up appointment with care management team member scheduled for: 01/07/23 at 130 pm       CCM (HYPERTENSION)  EXPECTED OUTCOME: MONITOR, SELF-MANAGE AND REDUCE SYMPTOMS OF HYPERTENSION       Current Barriers:  Knowledge Deficits related to Hypertension management Chronic Disease Management support and education needs related to Hypertension, diet No Advanced Directives in place- requests information be mailed Patient reports he lives with spouse, is independent in all aspects of his care, continues to drive. Patient reports he checks blood pressure 2-3 x week Patient reports he has treadmill and walks on some during the week, stays busy around his house  Planned Interventions: Evaluation of current treatment plan related to hypertension self management and patient's adherence to plan as established by provider;   Reviewed prescribed diet low sodium Reviewed medications with patient and discussed importance of compliance;  Counseled on the importance of exercise goals with target of 150 minutes per week Discussed plans with patient for ongoing care management follow up and provided patient with direct contact information for care management team; Advised patient, providing education and rationale, to monitor blood pressure daily and record, calling PCP for findings outside established parameters;  Advised patient to discuss any issues with blood pressure, medications  with provider; Provided education on prescribed diet low sodium;  Discussed complications of poorly controlled blood pressure such as heart disease, stroke, circulatory complications, vision complications, kidney impairment, sexual dysfunction;  Screening for signs and symptoms of depression related to chronic disease state;  Assessed social determinant of health barriers;  Reviewed upcoming scheduled appointments  Symptom Management: Take medications as prescribed   Attend all scheduled provider appointments Call pharmacy for medication refills 3-7 days in advance of running out of medications Attend church or other social  activities Perform all self care activities independently  Perform IADL's (shopping, preparing meals, housekeeping, managing finances) independently Call provider office for new concerns or questions  check blood pressure 3 times per week choose a place to take my blood pressure (home, clinic or office, retail store) write blood pressure results in a log or diary learn about high blood pressure keep a blood pressure log take blood pressure log to all doctor appointments keep all doctor appointments take medications for blood pressure exactly as prescribed report new symptoms to your doctor eat more whole grains, fruits and vegetables, lean meats and healthy fats Look over education mailed- low sodium diet  Follow Up Plan: Telephone follow up appointment with care management team member scheduled for:  01/07/23 at 130 pm          The patient verbalized understanding of instructions, educational materials, and care plan provided today and agreed to receive a mailed copy of patient instructions, educational materials, and care plan.   Telephone follow up appointment with care management team member scheduled for:  01/07/23 at 130 pm  Low-Sodium Eating Plan Sodium, which is an element that makes up salt, helps you maintain a healthy balance of fluids in your body. Too much sodium can increase your blood pressure and cause fluid and waste to be held in your body. Your health care provider or dietitian may recommend following this plan if you have high blood pressure (hypertension), kidney disease, liver disease, or heart failure. Eating less sodium can help lower your blood pressure, reduce swelling, and protect your heart, liver, and kidneys. What are tips for following this plan? Reading food labels The Nutrition Facts label lists  the amount of sodium in one serving of the food. If you eat more than one serving, you must multiply the listed amount of sodium by the number of  servings. Choose foods with less than 140 mg of sodium per serving. Avoid foods with 300 mg of sodium or more per serving. Shopping  Look for lower-sodium products, often labeled as "low-sodium" or "no salt added." Always check the sodium content, even if foods are labeled as "unsalted" or "no salt added." Buy fresh foods. Avoid canned foods and pre-made or frozen meals. Avoid canned, cured, or processed meats. Buy breads that have less than 80 mg of sodium per slice. Cooking  Eat more home-cooked food and less restaurant, buffet, and fast food. Avoid adding salt when cooking. Use salt-free seasonings or herbs instead of table salt or sea salt. Check with your health care provider or pharmacist before using salt substitutes. Cook with plant-based oils, such as canola, sunflower, or olive oil. Meal planning When eating at a restaurant, ask that your food be prepared with less salt or no salt, if possible. Avoid dishes labeled as brined, pickled, cured, smoked, or made with soy sauce, miso, or teriyaki sauce. Avoid foods that contain MSG (monosodium glutamate). MSG is sometimes added to [place] food, bouillon, and some canned foods. Make meals that can be grilled, baked, poached, roasted, or steamed. These are generally made with less sodium. General information Most people on this plan should limit their sodium intake to 1,500-2,000 mg (milligrams) of sodium each day. What foods should I eat? Fruits Fresh, frozen, or canned fruit. Fruit juice. Vegetables Fresh or frozen vegetables. "No salt added" canned vegetables. "No salt added" tomato sauce and paste. Low-sodium or reduced-sodium tomato and vegetable juice. Grains Low-sodium cereals, including oats, puffed wheat and rice, and shredded wheat. Low-sodium crackers. Unsalted rice. Unsalted pasta. Low-sodium bread. Whole-grain breads and whole-grain pasta. Meats and other proteins Fresh or frozen (no salt added) meat, poultry, seafood,  and fish. Low-sodium canned tuna and salmon. Unsalted nuts. Dried peas, beans, and lentils without added salt. Unsalted canned beans. Eggs. Unsalted nut butters. Dairy Milk. Soy milk. Cheese that is naturally low in sodium, such as ricotta cheese, fresh mozzarella, or Swiss cheese. Low-sodium or reduced-sodium cheese. Cream cheese. Yogurt. Seasonings and condiments Fresh and dried herbs and spices. Salt-free seasonings. Low-sodium mustard and ketchup. Sodium-free salad dressing. Sodium-free light mayonnaise. Fresh or refrigerated horseradish. Lemon juice. Vinegar. Other foods Homemade, reduced-sodium, or low-sodium soups. Unsalted popcorn and pretzels. Low-salt or salt-free chips. The items listed above may not be a complete list of foods and beverages you can eat. Contact a dietitian for more information. What foods should I avoid? Vegetables Sauerkraut, pickled vegetables, and relishes. Olives. [place] fries. Onion rings. Regular canned vegetables (not low-sodium or reduced-sodium). Regular canned tomato sauce and paste (not low-sodium or reduced-sodium). Regular tomato and vegetable juice (not low-sodium or reduced-sodium). Frozen vegetables in sauces. Grains Instant hot cereals. Bread stuffing, pancake, and biscuit mixes. Croutons. Seasoned rice or pasta mixes. Noodle soup cups. Boxed or frozen macaroni and cheese. Regular salted crackers. Self-rising flour. Meats and other proteins Meat or fish that is salted, canned, smoked, spiced, or pickled. Precooked or cured meat, such as sausages or meat loaves. Berniece Salines. Ham. Pepperoni. Hot dogs. Corned beef. Chipped beef. Salt pork. Jerky. Pickled herring. Anchovies and sardines. Regular canned tuna. Salted nuts. Dairy Processed cheese and cheese spreads. Hard cheeses. Cheese curds. Blue cheese. Feta cheese. String cheese. Regular cottage cheese. Buttermilk. Canned milk. Fats and  oils Salted butter. Regular margarine. Ghee. Bacon fat. Seasonings and  condiments Onion salt, garlic salt, seasoned salt, table salt, and sea salt. Canned and packaged gravies. Worcestershire sauce. Tartar sauce. Barbecue sauce. Teriyaki sauce. Soy sauce, including reduced-sodium. Steak sauce. Fish sauce. Oyster sauce. Cocktail sauce. Horseradish that you find on the shelf. Regular ketchup and mustard. Meat flavorings and tenderizers. Bouillon cubes. Hot sauce. Pre-made or packaged marinades. Pre-made or packaged taco seasonings. Relishes. Regular salad dressings. Salsa. Other foods Salted popcorn and pretzels. Corn chips and puffs. Potato and tortilla chips. Canned or dried soups. Pizza. Frozen entrees and pot pies. The items listed above may not be a complete list of foods and beverages you should avoid. Contact a dietitian for more information. Summary Eating less sodium can help lower your blood pressure, reduce swelling, and protect your heart, liver, and kidneys. Most people on this plan should limit their sodium intake to 1,500-2,000 mg (milligrams) of sodium each day. Canned, boxed, and frozen foods are high in sodium. Restaurant foods, fast foods, and pizza are also very high in sodium. You also get sodium by adding salt to food. Try to cook at home, eat more fresh fruits and vegetables, and eat less fast food and canned, processed, or prepared foods. This information is not intended to replace advice given to you by your health care provider. Make sure you discuss any questions you have with your health care provider. Document Revised: 11/05/2019 Document Reviewed: 09/01/2019 Elsevier Patient Education  [place]. Heart Failure Action Plan A heart failure action plan helps you understand what to do when you have symptoms of heart failure. Your action plan is a color-coded plan that lists the symptoms to watch for and indicates what actions to take. If you have symptoms in the red zone, you need medical care right away. If you have symptoms in the  yellow zone, you are having problems. If you have symptoms in the green zone, you are doing well. Follow the plan that was created by you and your health care provider. Review your plan each time you visit your health care provider. Red zone These signs and symptoms mean you should get medical help right away: You have trouble breathing when resting. You have a dry cough that is getting worse. You have swelling or pain in your legs or abdomen that is getting worse. You suddenly gain more than 2-3 lb (0.9-1.4 kg) in 24 hours, or more than 5 lb (2.3 kg) in a week. This amount may be more or less depending on your condition. You have trouble staying awake or you feel confused. You have chest pain. You do not have an appetite. You pass out. You have worsening sadness or depression. If you have any of these symptoms, call your local emergency services (911 in the [place] right away. Do not drive yourself to the hospital. Yellow zone These signs and symptoms mean your condition may be getting worse and you should make some changes: You have trouble breathing when you are active, or you need to sleep with your head raised on extra pillows to help you breathe. You have swelling in your legs or abdomen. You gain 2-3 lb (0.9-1.4 kg) in 24 hours, or 5 lb (2.3 kg) in a week. This amount may be more or less depending on your condition. You get tired easily. You have trouble sleeping. You have a dry cough. If you have any of these symptoms: Contact your health care provider within the next day.  Your health care provider may adjust your medicines. Green zone These signs mean you are doing well and can continue what you are doing: You do not have shortness of breath. You have very little swelling or no new swelling. Your weight is stable (no gain or loss). You have a normal activity level. You do not have chest pain or any other new symptoms. Follow these instructions at home: Take over-the-counter  and prescription medicines only as told by your health care provider. Weigh yourself daily. Your target weight is __________ lb (__________ kg). Call your health care provider if you gain more than __________ lb (__________ kg) in 24 hours, or more than __________ lb (__________ kg) in a week. Health care provider name: _____________________________________________________ Health care provider phone number: _____________________________________________________ Eat a heart-healthy diet. Work with a diet and nutrition specialist (dietitian) to create an eating plan that is best for you. Keep all follow-up visits. This is important. Where to find more information American Heart Association: Summary A heart failure action plan helps you understand what to do when you have symptoms of heart failure. Follow the action plan that was created by you and your health care provider. Get help right away if you have any symptoms in the red zone. This information is not intended to replace advice given to you by your health care provider. Make sure you discuss any questions you have with your health care provider. Document Revised: 01/08/2022 Document Reviewed: 05/15/2020 Elsevier Patient Education  [place].

## 2022-11-12 ENCOUNTER — Telehealth: Payer: Self-pay

## 2022-11-12 NOTE — Telephone Encounter (Signed)
-----  Message from Chalmers Guest, MD sent at 11/07/2022  1:01 PM EST ----- Heart pumping function remained low, 30-35% (somewhat improved compared to prior Echos). LHC is normal. PVC burden is 24%. 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

## 2022-11-12 NOTE — Telephone Encounter (Signed)
Letter mailed

## 2022-11-13 ENCOUNTER — Telehealth: Payer: Self-pay | Admitting: Family Medicine

## 2022-11-13 ENCOUNTER — Other Ambulatory Visit: Payer: PPO | Admitting: Pharmacist

## 2022-11-13 DIAGNOSIS — I110 Hypertensive heart disease with heart failure: Secondary | ICD-10-CM

## 2022-11-13 DIAGNOSIS — I5020 Unspecified systolic (congestive) heart failure: Secondary | ICD-10-CM

## 2022-11-13 NOTE — Telephone Encounter (Signed)
Patient returning another call, says must speak to [facility] about RX. Call back  [phone].

## 2022-11-13 NOTE — Telephone Encounter (Signed)
Patient returning your call. Return patient call at  [phone].

## 2022-11-14 ENCOUNTER — Other Ambulatory Visit: Payer: Self-pay

## 2022-11-14 MED ORDER — DAPAGLIFLOZIN PROPANEDIOL 10 MG PO TABS: 10.0000 mg | ORAL_TABLET | Freq: Every day | ORAL | 5 refills | Status: DC

## 2022-11-19 ENCOUNTER — Telehealth: Payer: Self-pay | Admitting: Internal Medicine

## 2022-11-19 ENCOUNTER — Other Ambulatory Visit: Payer: Self-pay

## 2022-11-19 MED ORDER — DAPAGLIFLOZIN PROPANEDIOL 10 MG PO TABS: 10.0000 mg | ORAL_TABLET | Freq: Every day | ORAL | 5 refills | Status: DC

## 2022-11-19 NOTE — Telephone Encounter (Signed)
Pt came in office and stated that [facility] told him his RX for [place] would been to be sent to them from the pharmacy where his cardiologist is located. [phone] (home).

## 2022-11-20 ENCOUNTER — Ambulatory Visit (INDEPENDENT_AMBULATORY_CARE_PROVIDER_SITE_OTHER): Payer: PPO | Admitting: *Deleted

## 2022-11-20 DIAGNOSIS — I10 Essential (primary) hypertension: Secondary | ICD-10-CM

## 2022-11-20 DIAGNOSIS — I5022 Chronic systolic (congestive) heart failure: Secondary | ICD-10-CM

## 2022-11-20 NOTE — Chronic Care Management (AMB) (Signed)
Chronic Care Management   CCM RN Visit Note  11/20/2022 Name: [name] MRN: [id number] DOB: 1940-11-23  Subjective: [name] is a 82 y.o. year old male who is a primary care patient of Fayrene Helper, MD. The patient was referred to the Chronic Care Management team for assistance with care management needs subsequent to provider initiation of CCM services and plan of care.    Today's Visit:  Engaged with patient by telephone for follow up visit.        Goals Addressed             This Visit's Progress    CCM (HYPERTENSION) EXPECTED OUTCOME: MONITOR, SELF-MANAGE AND REDUCE SYMPTOMS OF HYPERTENSION       Current Barriers:  Knowledge Deficits related to Hypertension management Chronic Disease Management support and education needs related to Hypertension, diet No Advanced Directives in place- requests information be mailed Patient reports he lives with spouse, is independent in all aspects of his care, continues to drive. Patient reports he checks blood pressure 2-3 x week Patient reports he has treadmill and walks on some during the week, stays busy around his house Patient called left voicemail requesting return phone call stating he is confused about getting farxiga.  RN care manager called and spoke to patient about calling cardiology back.  Planned Interventions: Evaluation of current treatment plan related to hypertension self management and patient's adherence to plan as established by provider;   Reviewed prescribed diet low sodium Reviewed medications with patient and discussed importance of compliance;  Counseled on the importance of exercise goals with target of 150 minutes per week Discussed plans with patient for ongoing care management follow up and provided patient with direct contact information for care management team; Advised patient, providing education and rationale, to monitor blood pressure daily and record, calling PCP for findings  outside established parameters;  Advised patient to discuss any issues with blood pressure, medications  with provider; Provided education on prescribed diet low sodium;  Discussed complications of poorly controlled blood pressure such as heart disease, stroke, circulatory complications, vision complications, kidney impairment, sexual dysfunction;  Screening for signs and symptoms of depression related to chronic disease state;  Assessed social determinant of health barriers;  Reviewed upcoming scheduled appointments Reviewed with patient that per notes from this morning, cardiologist office Christella Scheuermann CMA) called pt today 11/21/23 and left voicemail requesting return phone call regarding farxiga, pt verbalizes understanding and states he will call cardiology office  Symptom Management: Take medications as prescribed   Attend all scheduled provider appointments Call pharmacy for medication refills 3-7 days in advance of running out of medications Attend church or other social activities Perform all self care activities independently  Perform IADL's (shopping, preparing meals, housekeeping, managing finances) independently Call provider office for new concerns or questions  check blood pressure 3 times per week choose a place to take my blood pressure (home, clinic or office, retail store) write blood pressure results in a log or diary learn about high blood pressure keep a blood pressure log take blood pressure log to all doctor appointments keep all doctor appointments take medications for blood pressure exactly as prescribed report new symptoms to your doctor eat more whole grains, fruits and vegetables, lean meats and healthy fats Look over education mailed- low sodium diet Please call cardiology office, ask for Christella Scheuermann to help you with information about farxiga  Follow Up Plan: Telephone follow up appointment with care management team member scheduled  for:  01/07/23 at 130 pm           Plan:Telephone follow up appointment with care management team member scheduled for:  01/07/23 at 130 pm  Jacqlyn Larsen [facility], BSN RN Case Manager [facility] [phone]

## 2022-11-20 NOTE — Telephone Encounter (Signed)
Patient notified that medication was sent to correct pharmacy.

## 2022-11-20 NOTE — Telephone Encounter (Signed)
Left a message for patient to call office back regarding farxiga.

## 2022-11-20 NOTE — Patient Instructions (Signed)
Please call the care guide team at [phone] if you need to cancel or reschedule your appointment.   If you are experiencing a Mental Health or [place] or need someone to talk to, please call the Suicide and Crisis Lifeline: 988 call the [place] National Suicide Prevention Lifeline: 570-479-0020 or TTY: 760-887-7979 TTY 249-175-8013) to talk to a trained counselor call 1-800-273-TALK (toll free, 24 hour hotline) go to [facility] [address], [place] [phone]) call the [facility]: [phone] call 911   Following is a copy of the CCM Program Consent:  CCM service includes personalized support from designated clinical staff supervised by the physician, including individualized plan of care and coordination with other care providers 24/7 contact phone numbers for assistance for urgent and routine care needs. Service will only be billed when office clinical staff spend 20 minutes or more in a month to coordinate care. Only one practitioner may furnish and bill the service in a calendar month. The patient may stop CCM services at amy time (effective at the end of the month) by phone call to the office staff. The patient will be responsible for cost sharing (co-pay) or up to 20% of the service fee (after annual deductible is met)  Following is a copy of your full provider care plan:   Goals Addressed             This Visit's Progress    CCM (HYPERTENSION) EXPECTED OUTCOME: MONITOR, SELF-MANAGE AND REDUCE SYMPTOMS OF HYPERTENSION       Current Barriers:  Knowledge Deficits related to Hypertension management Chronic Disease Management support and education needs related to Hypertension, diet No Advanced Directives in place- requests information be mailed Patient reports he lives with spouse, is independent in all aspects of his care, continues to drive. Patient reports he checks blood pressure 2-3 x  week Patient reports he has treadmill and walks on some during the week, stays busy around his house Patient called left voicemail requesting return phone call stating he is confused about getting farxiga.  RN care manager called and spoke to patient about calling cardiology back.  Planned Interventions: Evaluation of current treatment plan related to hypertension self management and patient's adherence to plan as established by provider;   Reviewed prescribed diet low sodium Reviewed medications with patient and discussed importance of compliance;  Counseled on the importance of exercise goals with target of 150 minutes per week Discussed plans with patient for ongoing care management follow up and provided patient with direct contact information for care management team; Advised patient, providing education and rationale, to monitor blood pressure daily and record, calling PCP for findings outside established parameters;  Advised patient to discuss any issues with blood pressure, medications  with provider; Provided education on prescribed diet low sodium;  Discussed complications of poorly controlled blood pressure such as heart disease, stroke, circulatory complications, vision complications, kidney impairment, sexual dysfunction;  Screening for signs and symptoms of depression related to chronic disease state;  Assessed social determinant of health barriers;  Reviewed upcoming scheduled appointments Reviewed with patient that per notes from this morning, cardiologist office Christella Scheuermann CMA) called pt today 11/21/23 and left voicemail requesting return phone call regarding farxiga, pt verbalizes understanding and states he will call cardiology office  Symptom Management: Take medications as prescribed   Attend all scheduled provider appointments Call pharmacy for medication refills 3-7 days in advance of running out of medications Attend church or other social  activities Perform all self  care activities independently  Perform IADL's (shopping, preparing meals, housekeeping, managing finances) independently Call provider office for new concerns or questions  check blood pressure 3 times per week choose a place to take my blood pressure (home, clinic or office, retail store) write blood pressure results in a log or diary learn about high blood pressure keep a blood pressure log take blood pressure log to all doctor appointments keep all doctor appointments take medications for blood pressure exactly as prescribed report new symptoms to your doctor eat more whole grains, fruits and vegetables, lean meats and healthy fats Look over education mailed- low sodium diet Please call cardiology office, ask for Christella Scheuermann to help you with information about farxiga  Follow Up Plan: Telephone follow up appointment with care management team member scheduled for:  01/07/23 at 130 pm          The patient verbalized understanding of instructions, educational materials, and care plan provided today and DECLINED offer to receive copy of patient instructions, educational materials, and care plan.   Telephone follow up appointment with care management team member scheduled for:  01/07/23 at 130 pm

## 2022-11-20 NOTE — Telephone Encounter (Signed)
Patient is returning call. Transferred to Ashley, CMA.  

## 2022-11-25 ENCOUNTER — Telehealth: Payer: Self-pay

## 2022-11-25 NOTE — Telephone Encounter (Signed)
Patient notified and verbalized understanding. Patient had no further questions or concerns at this time.

## 2022-11-25 NOTE — Telephone Encounter (Signed)
-----   Message from Chalmers Guest, MD sent at 11/22/2022  8:12 AM EST ----- PVC burden improved significantly compared to the prior event monitor from 04/2022 but still frequent. Continue current medications and follow-up with EP.

## 2022-12-04 ENCOUNTER — Encounter: Payer: Self-pay | Admitting: Pharmacist

## 2022-12-04 NOTE — Progress Notes (Signed)
12/04/2022 Name: Lorraine Rathgeb MRN: [id number] DOB: 21-Apr-1941  Chief Complaint  Patient presents with   Medication Management    Farxiga-CHF    Zyiere Bodkins is a 82 y.o. year old male who presented for a telephone visit.   They were referred to the pharmacist by their PCP for assistance in managing medication access. hypertension  Subjective:  Care Team: Primary Care Provider: Fayrene Helper, MD ; Next Scheduled Visit: 04/22/23  Medication Access/Adherence  Current Pharmacy:  [facility], [place] [address] Phone: [phone] Fax: [phone]  [facility], [place] [address] Phone: [phone] Fax: [facility], [place] [address] AT [place] [address] Phone: [phone] Fax: [facility], [place] [address] Phone: [phone] Fax: [phone]   Patient reports affordability concerns with their medications: Yes  Patient reports access/transportation concerns to their pharmacy: No  Patient reports adherence concerns with their medications:  No    Objective:  Lab Results  Component Value Date   HGBA1C 6.2 (H) 10/10/2022    Lab Results  Component Value Date   CREATININE 1.26 10/10/2022   BUN 16 10/10/2022   NA 141 10/10/2022   K 4.2 10/10/2022   CL 103 10/10/2022   CO2 25 10/10/2022    Lab Results  Component Value Date   CHOL 154 10/10/2022   HDL 66 10/10/2022   LDLCALC 75 10/10/2022   TRIG 64 10/10/2022   CHOLHDL 2.3 10/10/2022    Medications Reviewed Today     Reviewed by Lavera Guise, [facility] (Pharmacist) on 11/13/22 at [place] List Status: <None>   Medication Order Taking? Sig Documenting Provider Last Dose Status  Informant  amLODipine (NORVASC) 10 MG tablet [id number] No TAKE ONE TABLET BY MOUTH ONCE DAILY. Fayrene Helper, MD Taking Active   aspirin EC 81 MG tablet [id number] No Take 1 tablet (81 mg total) by mouth daily. Fayrene Helper, MD Taking Active Self  cetirizine (ZYRTEC) 10 MG tablet [id number] No TAKE 1 TABLET EVERY DAY Fayrene Helper, MD Taking Active   dapagliflozin propanediol (FARXIGA) 10 MG TABS tablet [id number] No Take 1 tablet (10 mg total) by mouth daily before breakfast. Donato Heinz, MD Taking Active            Med Note Moshe Cipro, [place]   Fri Oct 11, 2022  1:33 PM) 10/11/2022 reports currently unable to afford medicatio but does want to take it,   EPINEPHrine 0.3 mg/0.3 mL IJ SOAJ injection [id number] No Inject 0.3 mLs (0.3 mg total) into the muscle as needed for anaphylaxis. Fayrene Helper, MD Taking Active   levothyroxine (SYNTHROID) 75 MCG tablet [id number] No Take 1 tablet (75 mcg total) by mouth daily. Fayrene Helper, MD Taking Active   losartan (COZAAR) 50 MG tablet [id number] No Take 1 tablet (50 mg total) by mouth daily. Donato Heinz, MD Taking Active   lovastatin (MEVACOR) 40 MG tablet [id number] No TAKE 1 TABLET EVERY DAY Fayrene Helper, MD Taking Active   metoprolol succinate (TOPROL XL) 50 MG 24 hr tablet [id number] No Take 1 tablet (50 mg total) by mouth daily. Take  with or immediately following a meal. Donato Heinz, MD Taking Active   Multiple Vitamin (MULTIVITAMIN WITH MINERALS) TABS tablet [id number] No Take 1 tablet by mouth daily. [provider] Taking Active Self  spironolactone (ALDACTONE) 25 MG tablet [id number] No Take 0.5 tablets (12.5 mg total) by mouth daily. Donato Heinz, MD Taking Active   tadalafil (CIALIS) 5 MG tablet [id number] No Take 1 tablet (5 mg total) by mouth daily. Fayrene Helper, MD Taking Active   tamsulosin [facility]) 0.4 MG CAPS capsule [id number] No Take 1 capsule (0.4 mg  total) by mouth daily. Fayrene Helper, MD Taking Active               Assessment/Plan:   -Patient is in need of med assistance for [facility] for CHF.  [place]med [place] to home).  I sent a message to cardiologist to send in new RX for Farxiga 39m daily.  RX was sent and patient should receive medication in 10-14 business days.  Hypertension: - Currently controlled - Reviewed long term cardiovascular and renal outcomes of uncontrolled blood pressure - Reviewed appropriate blood pressure monitoring technique and reviewed goal blood pressure. Recommended to check home blood pressure and heart rate daily - Recommend to continue taking meds as prescribed and follow with PCP & cardiology     Follow Up Plan: 1-2 months with PharmD    JRegina Eck PharmD, BCPS, BBelmoreClinical Pharmacist, [place]

## 2022-12-12 DIAGNOSIS — I5022 Chronic systolic (congestive) heart failure: Secondary | ICD-10-CM

## 2022-12-12 DIAGNOSIS — I10 Essential (primary) hypertension: Secondary | ICD-10-CM

## 2022-12-13 ENCOUNTER — Telehealth: Payer: Self-pay | Admitting: Cardiology

## 2022-12-13 NOTE — Telephone Encounter (Signed)
[phone]  Spoke to customer rep who stated [name] was mailed out on 02/22 and will take 5-7 business days to reach patient .  Patient provided with Customer Service number. Patient verbalized understanding and will call [place] to see when his medication will be arriving at this address.

## 2022-12-13 NOTE — Telephone Encounter (Signed)
Pt c/o medication issue:  1. Name of Medication: dapagliflozin propanediol (FARXIGA) 10 MG TABS tablet   2. How are you currently taking this medication (dosage and times per day)?    3. Are you having a reaction (difficulty breathing--STAT)? no  4. What is your medication issue? Patient calling because he hasn't heard anything in regards to getting medication. Please advise

## 2022-12-24 ENCOUNTER — Ambulatory Visit: Payer: PPO | Attending: Internal Medicine | Admitting: Internal Medicine

## 2022-12-24 ENCOUNTER — Encounter: Payer: Self-pay | Admitting: Internal Medicine

## 2022-12-24 VITALS — BP 116/84 | HR 76 | Ht 67.0 in | Wt 138.0 lb

## 2022-12-24 DIAGNOSIS — I472 Ventricular tachycardia, unspecified: Secondary | ICD-10-CM

## 2022-12-24 MED ORDER — AMIODARONE HCL 200 MG PO TABS: 200.0000 mg | ORAL_TABLET | Freq: Two times a day (BID) | ORAL | 3 refills | Status: DC

## 2022-12-24 NOTE — Patient Instructions (Signed)
Medication Instructions:  Take Amiodarone 200 mg Two Times Daily  On May 1 Decrease to one tablet daily   *If you need a refill on your cardiac medications before your next appointment, please call your pharmacy*   Lab Work: NONE  If you have labs (blood work) drawn today and your tests are completely normal, you will receive your results only by: [facility] (if you have MyChart) OR A paper copy in the mail If you have any lab test that is abnormal or we need to change your treatment, we will call you to review the results.   Testing/Procedures: NONE    Follow-Up: At [facility], you and your health needs are our priority.  As part of our continuing mission to provide you with exceptional heart care, we have created designated Provider Care Teams.  These Care Teams include your primary Cardiologist (physician) and Advanced Practice Providers (APPs -  Physician Assistants and Nurse Practitioners) who all work together to provide you with the care you need, when you need it.  We recommend signing up for the patient portal called "MyChart".  Sign up information is provided on this After Visit Summary.  MyChart is used to connect with patients for Virtual Visits (Telemedicine).  Patients are able to view lab/test results, encounter notes, upcoming appointments, etc.  Non-urgent messages can be sent to your provider as well.   To learn more about what you can do with MyChart, go to [email or web address].    Your next appointment:    June   Provider:   Cristopher Peru, MD    Other Instructions Thank you for choosing [facility]!

## 2022-12-24 NOTE — Progress Notes (Signed)
HPI Mr. Goods is referred by Dr. Dellia Cloud for evaluation of PVC's. He has a h/o non-ischemic CM, and has class 1 symtoms on maximal medical therapy. The patient has not had syncope. He does not feel his palpitaitons. He had a 25% burden. He has more than one morphology. He has been on a beta blocker. His EF is 25%. He feels well.  Allergies  Allergen Reactions   Other Other (See Comments) and Shortness Of Breath   Peanut-Containing Drug Products Shortness Of Breath and Swelling   Egg-Derived Products Swelling   Iodine Swelling   Shellfish Allergy Swelling   Penicillins Hives, Rash and Other (See Comments)    Has patient had a PCN reaction causing immediate rash, facial/tongue/throat swelling, SOB or lightheadedness with hypotension: No  Has patient had a PCN reaction causing severe rash involving mucus membranes or skin necrosis: Yes  Has patient had a PCN reaction that required hospitalization No  Has patient had a PCN reaction occurring within the last 10 years: No  If all of the above answers are "NO", then may proceed with Cephalosporin use.  Has patient had a PCN reaction causing immediate rash, facial/tongue/throat swelling, SOB or lightheadedness with hypotension: No, Has patient had a PCN reaction causing severe rash involving mucus membranes or skin necrosis: Yes, Has patient had a PCN reaction that required hospitalization No, Has patient had a PCN reaction occurring within the last 10 years: No, If all of the above answers are "NO", then may proceed with Cephalosporin use.     Current Outpatient Medications  Medication Sig Dispense Refill   amLODipine (NORVASC) 10 MG tablet TAKE ONE TABLET BY MOUTH ONCE DAILY. 90 tablet 0   aspirin EC 81 MG tablet Take 1 tablet (81 mg total) by mouth daily. 30 tablet 5   cetirizine (ZYRTEC) 10 MG tablet TAKE 1 TABLET EVERY DAY 90 tablet 10   dapagliflozin propanediol (FARXIGA) 10 MG TABS tablet Take 1 tablet (10 mg total) by  mouth daily before breakfast. 90 tablet 5   EPINEPHrine 0.3 mg/0.3 mL IJ SOAJ injection Inject 0.3 mLs (0.3 mg total) into the muscle as needed for anaphylaxis. 1 each 2   levothyroxine (SYNTHROID) 75 MCG tablet Take 1 tablet (75 mcg total) by mouth daily. 90 tablet 1   losartan (COZAAR) 50 MG tablet Take 1 tablet (50 mg total) by mouth daily. 90 tablet 3   lovastatin (MEVACOR) 40 MG tablet TAKE 1 TABLET EVERY DAY 90 tablet 0   metoprolol succinate (TOPROL XL) 50 MG 24 hr tablet Take 1 tablet (50 mg total) by mouth daily. Take with or immediately following a meal. 90 tablet 3   Multiple Vitamin (MULTIVITAMIN WITH MINERALS) TABS tablet Take 1 tablet by mouth daily.     spironolactone (ALDACTONE) 25 MG tablet Take 0.5 tablets (12.5 mg total) by mouth daily. 45 tablet 3   tadalafil (CIALIS) 5 MG tablet Take 1 tablet (5 mg total) by mouth daily. 90 tablet 3   tamsulosin (FLOMAX) 0.4 MG CAPS capsule Take 1 capsule (0.4 mg total) by mouth daily. 30 capsule 5   No current facility-administered medications for this visit.     Past Medical History:  Diagnosis Date   Allergic rhinitis, seasonal    BPH (benign prostatic hypertrophy)    Erectile dysfunction    History of kidney stones    Hx of thyroidectomy    Hyperlipidemia    Hypertension    Hypothyroidism    Pre-diabetes  Prostate cancer ([place]) 2011   treated with radiation   Urosepsis 07/2011   ICU admission [facility]    ROS:   All systems reviewed and negative except as noted in the HPI.   Past Surgical History:  Procedure Laterality Date   COLONOSCOPY N/A 09/03/2013   Procedure: COLONOSCOPY;  Surgeon: Rogene Houston, MD;  [facility];  Service: Endoscopy;  Laterality: N/A;  1225   COLONOSCOPY N/A 11/08/2015   Procedure: COLONOSCOPY;  Surgeon: Rogene Houston, MD;  Location: [facility];  Service: Endoscopy;  Laterality: N/A;  1200   MASS EXCISION N/A 08/15/2017   Procedure: EXCISION SEBACEOUS CYST NECK;  Surgeon:  Aviva Signs, MD;  Location: [facility];  Service: General;  Laterality: N/A;   PROSTATE SURGERY     RIGHT/LEFT HEART CATH AND CORONARY ANGIOGRAPHY N/A 04/30/2022   Procedure: RIGHT/LEFT HEART CATH AND CORONARY ANGIOGRAPHY;  Surgeon: Martinique, Peter M, MD;  Location: [facility];  Service: Cardiovascular;  Laterality: N/A;   THYROIDECTOMY  2003   transurethral resection of thr prostate  2003   ureteral stone extraction  08/2011     Family History  Problem Relation Age of Onset   Stroke Mother    Diabetes Father    Hypertension Father    Kidney disease Brother    Stroke Brother    Diabetes Brother    Gout Brother    Diabetes Brother      Social History   Socioeconomic History   Marital status: Married    Spouse name: Not on file   Number of children: 5   Years of education: Not on file   Highest education level: Not on file  Occupational History   Occupation: retired  Tobacco Use   Smoking status: Never    Passive exposure: Never   Smokeless tobacco: Never  Vaping Use   Vaping Use: Never used  Substance and Sexual Activity   Alcohol use: No    Alcohol/week: 0.0 standard drinks of alcohol   Drug use: No   Sexual activity: Yes  Other Topics Concern   Not on file  Social History Narrative   Not on file   Social Determinants of Health   Financial Resource Strain: Low Risk  (11/11/2022)   Overall Financial Resource Strain (CARDIA)    Difficulty of Paying Living Expenses: Not very hard  Food Insecurity: No Food Insecurity (11/11/2022)   Hunger Vital Sign    Worried About Running Out of Food in the Last Year: Never true    Ran Out of Food in the Last Year: Never true  Transportation Needs: No Transportation Needs (11/11/2022)   PRAPARE - Hydrologist (Medical): No    Lack of Transportation (Non-Medical): No  Physical Activity: Insufficiently Active (11/11/2022)   Exercise Vital Sign    Days of Exercise per Week: 3 days    Minutes of  Exercise per Session: 20 min  Stress: No Stress Concern Present (11/11/2022)   [place]    Feeling of Stress : Not at all  Social Connections: Moderately Integrated (11/11/2022)   Social Connection and Isolation Panel [NHANES]    Frequency of Communication with Friends and Family: More than three times a week    Frequency of Social Gatherings with Friends and Family: More than three times a week    Attends Religious Services: More than 4 times per year    Active Member of [employer]  or Organizations: No    Attends Archivist Meetings: Never    Marital Status: Married  Human resources officer Violence: Not At Risk (11/11/2022)   Humiliation, Afraid, Rape, and Kick questionnaire    Fear of Current or Ex-Partner: No    Emotionally Abused: No    Physically Abused: No    Sexually Abused: No     BP 116/84   Pulse 76   Ht '5\' 7"'$  (1.702 m)   Wt 138 lb (62.6 kg)   SpO2 99%   BMI 21.61 kg/m   Physical Exam:  Well appearing NAD HEENT: Unremarkable Neck:  No JVD, no thyromegally Lymphatics:  No adenopathy Back:  No CVA tenderness Lungs:  Clear with no wheezes HEART:  Regular rate rhythm, no murmurs, no rubs, no clicks Abd:  soft, positive bowel sounds, no organomegally, no rebound, no guarding Ext:  2 plus pulses, no edema, no cyanosis, no clubbing Skin:  No rashes no nodules Neuro:  CN II through XII intact, motor grossly intact   Assess/Plan: Non-ischemic CM - I am not convinced his CM is due to PVC. However we will attempt to suppress and then repeat his echo. He will continue GDMT. PVC's - I discussed the treatment options. His age and more than 1 PVC make ablation a poor choice. I will ask him to try amio. If this suppresses we will recheck his zio monitor and echo.   Carleene Overlie Alin Chavira,MD

## 2023-01-06 ENCOUNTER — Other Ambulatory Visit: Payer: Self-pay | Admitting: Family Medicine

## 2023-01-07 ENCOUNTER — Telehealth: Payer: Self-pay | Admitting: *Deleted

## 2023-01-07 ENCOUNTER — Telehealth: Payer: PPO

## 2023-01-07 DIAGNOSIS — H524 Presbyopia: Secondary | ICD-10-CM | POA: Diagnosis not present

## 2023-01-07 DIAGNOSIS — H52203 Unspecified astigmatism, bilateral: Secondary | ICD-10-CM | POA: Diagnosis not present

## 2023-01-07 DIAGNOSIS — H25813 Combined forms of age-related cataract, bilateral: Secondary | ICD-10-CM | POA: Diagnosis not present

## 2023-01-07 DIAGNOSIS — H5203 Hypermetropia, bilateral: Secondary | ICD-10-CM | POA: Diagnosis not present

## 2023-01-07 NOTE — Telephone Encounter (Signed)
   CCM RN Visit Note   01/07/23 Name: [name] MRN: [id number]      DOB: Mar 04, 1941  Subjective: [name] is a 82 y.o. year old male who is a primary care patient of Tula Nakayama MD. The patient was referred to the Chronic Care Management team for assistance with care management needs subsequent to provider initiation of CCM services and plan of care.      An unsuccessful telephone outreach was attempted today to contact the patient about Chronic Care Management needs.    Plan:Telephone follow up appointment with care management team member scheduled for:  upon care guide rescheduling.  Jacqlyn Larsen [facility], BSN RN Case Manager [facility] [phone]

## 2023-01-10 IMAGING — DX DG LUMBAR SPINE COMPLETE 4+V
5 series · 5 of 5 positions shown · non-contrast
Comparison: None.

CLINICAL DATA: Low back pain, right hip pain

EXAM:
LUMBAR SPINE - COMPLETE 4+ VIEW

[l-spine ap]
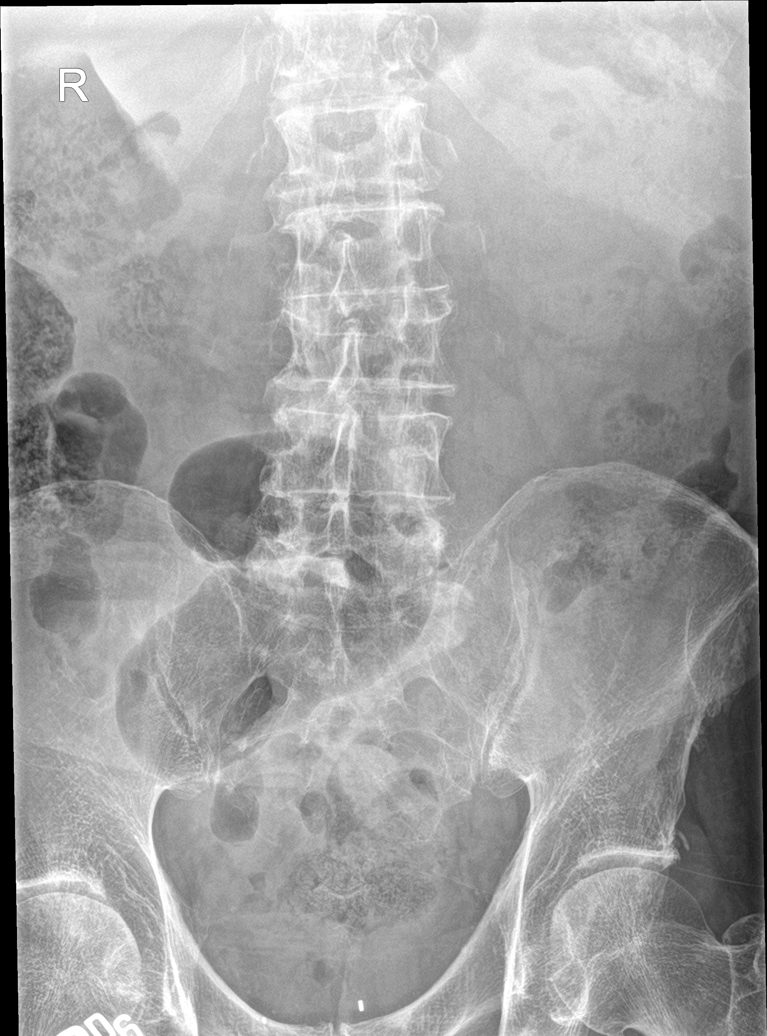

[l-spine obl (1 of 2)]
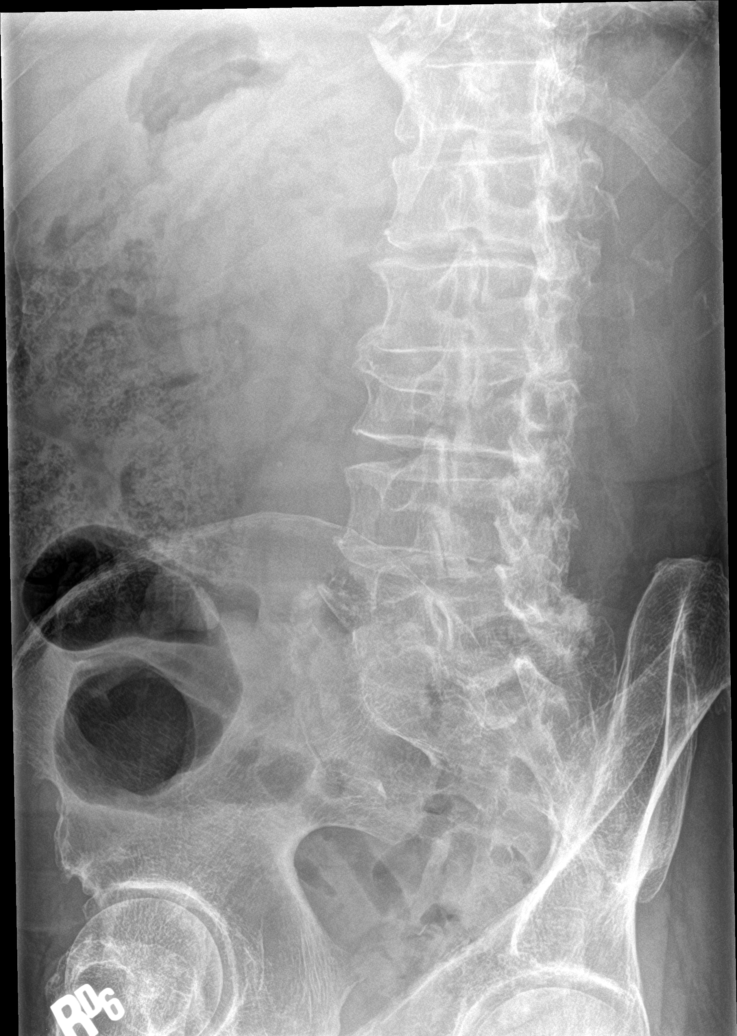

[l-spine obl (2 of 2)]
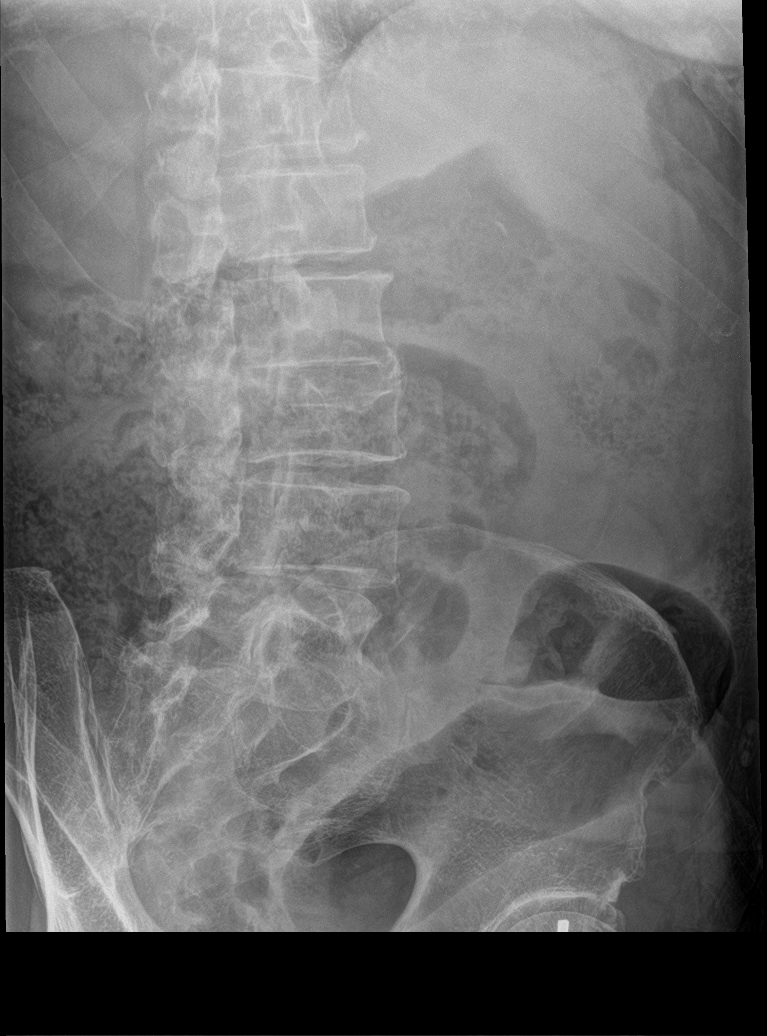

[l-spine lat]
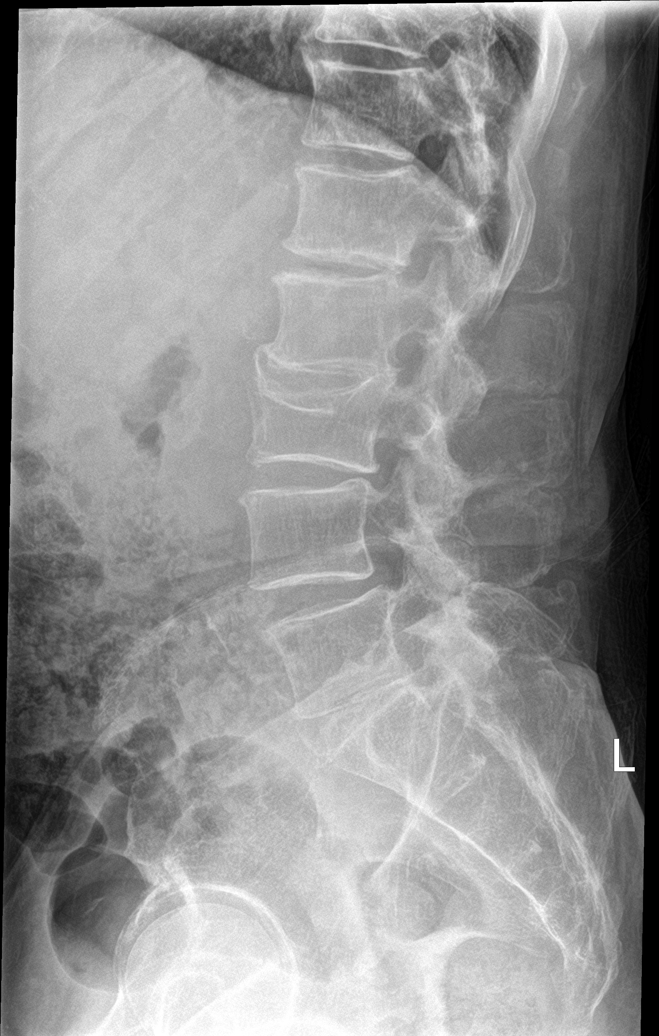

[l-spine spot]
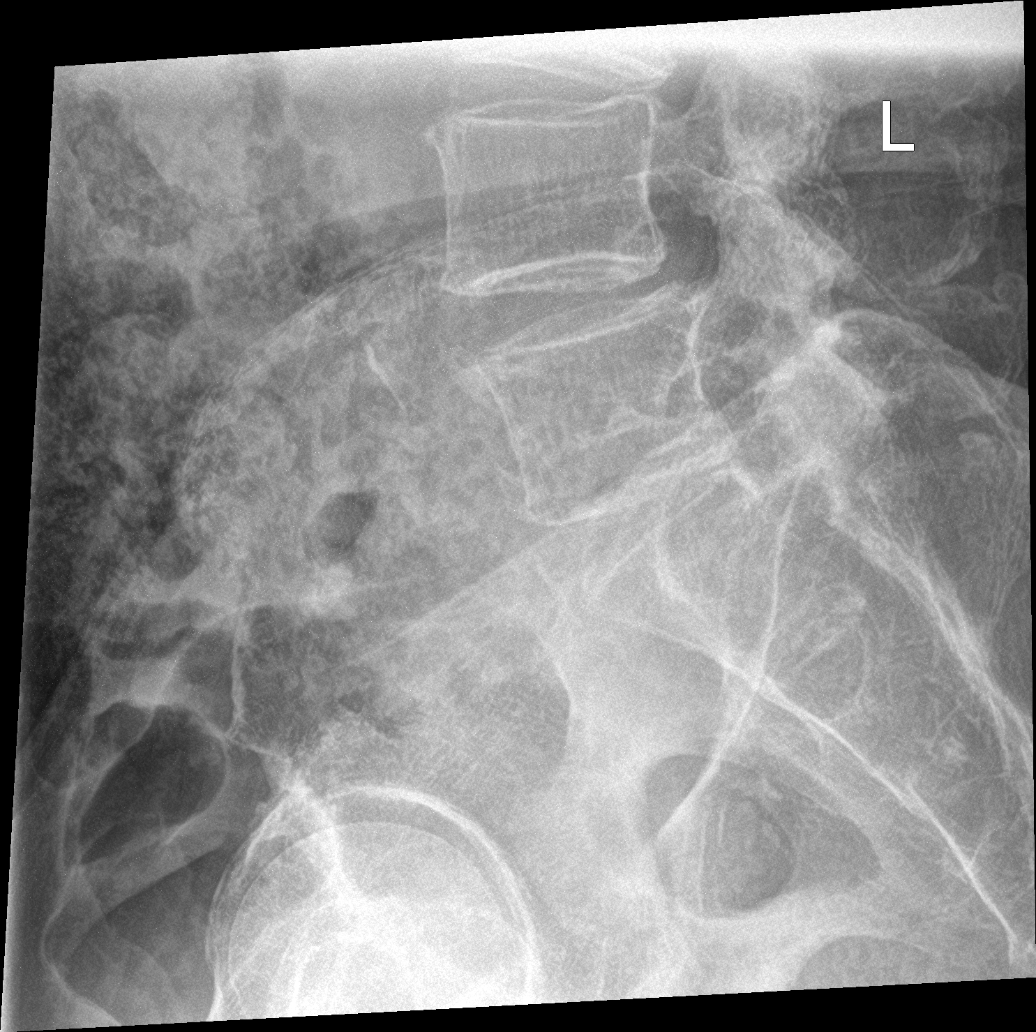

[5 of 5 positions shown; findings below may reference images not displayed]

FINDINGS: 4 mm anterolisthesis L4-5. Otherwise normal lumbar lordosis. There
is a superior endplate fracture of L3 with approximately 25% loss of
height. No retropulsion. This appears unchanged from CT examination
of the abdomen pelvis of 07/18/2011. No acute fracture of the lumbar
spine. Remaining vertebral body height is preserved. There is
intervertebral disc space narrowing and endplate remodeling at L1-2,
L2-3 in keeping with changes of mild degenerative disc disease.
Remaining intervertebral disc heights are preserved. Facet arthrosis
at L4-S1 is not well profiled on this examination the paraspinal
soft tissues are unremarkable.
IMPRESSION: Stable remote compression deformity L3 with approximately 25% loss
of height.

Degenerative disc disease L1-L3.

Facet arthrosis L4-S1 with associated grade 1 anterolisthesis L4-5.
And

## 2023-01-10 IMAGING — DX DG HIP (WITH OR WITHOUT PELVIS) 2-3V*R*
3 series · 3 of 3 positions shown · non-contrast
Comparison: None.

CLINICAL DATA: Right hip pain

EXAM:
DG HIP (WITH OR WITHOUT PELVIS) 2-3V RIGHT

[pelvis ap]
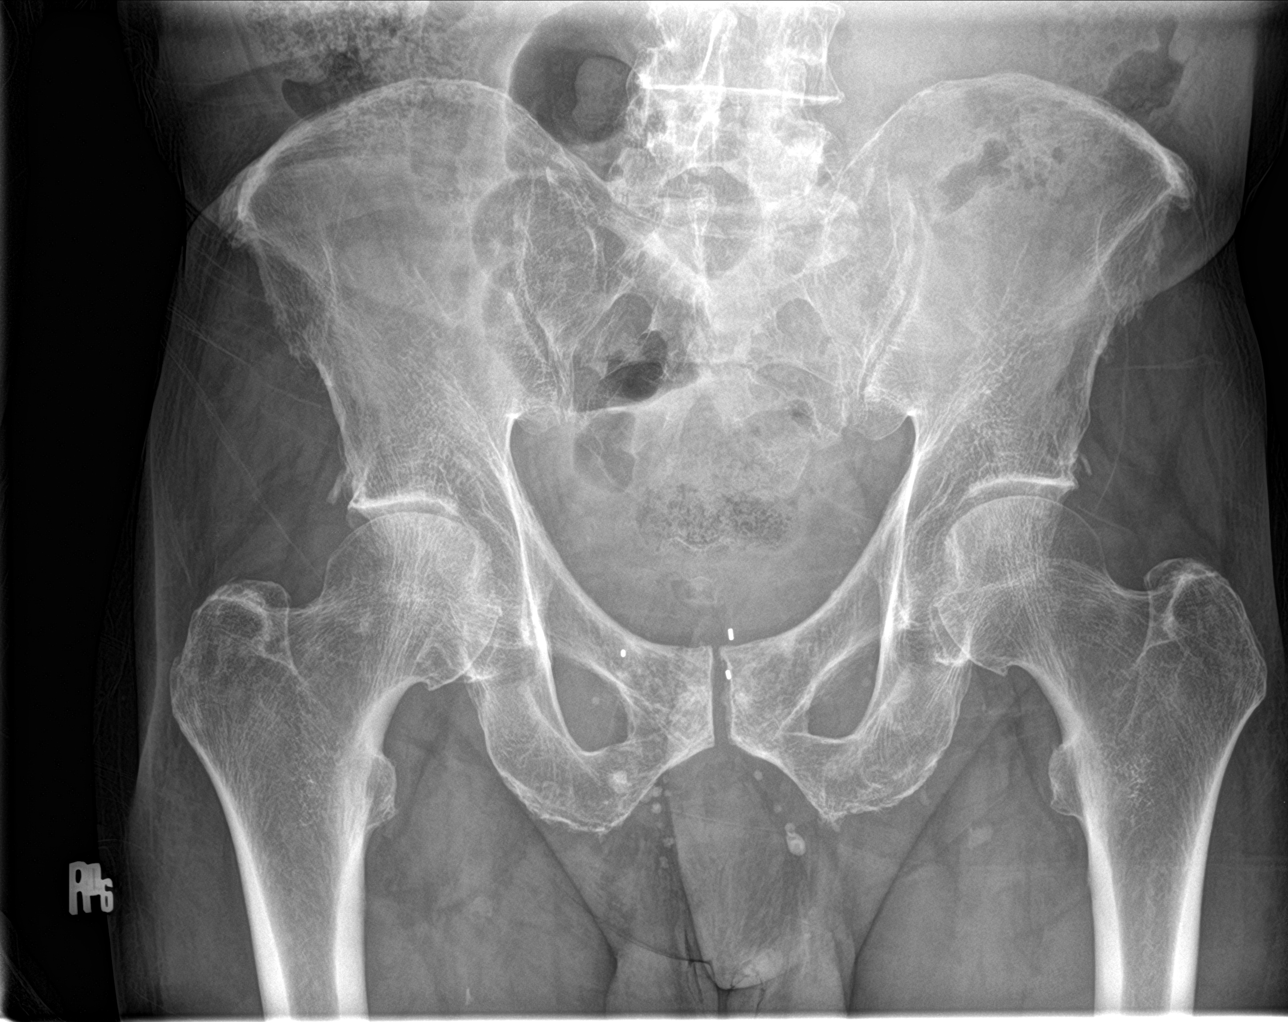

[hip ap]
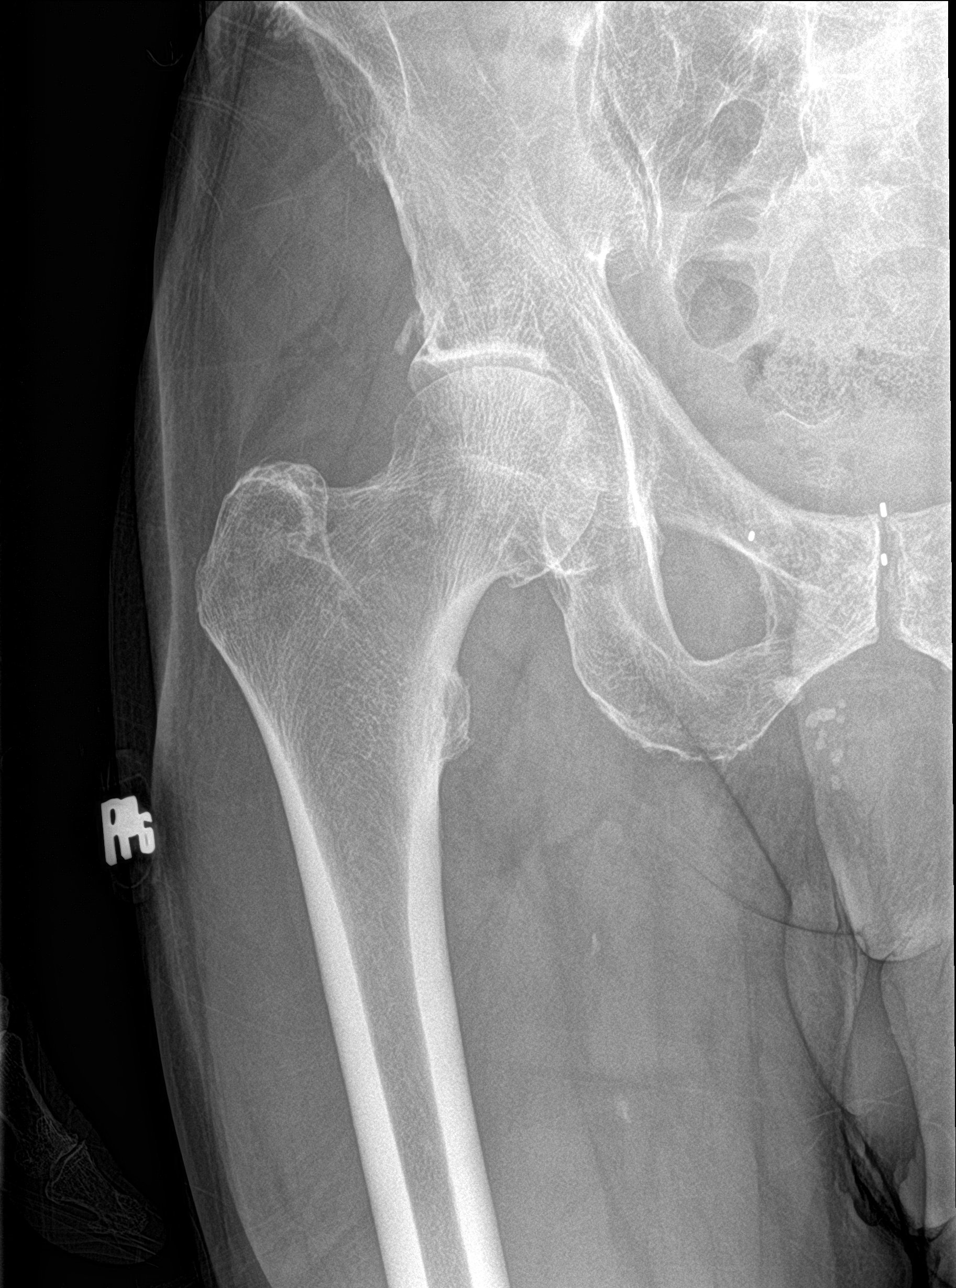

[hip frog leg]
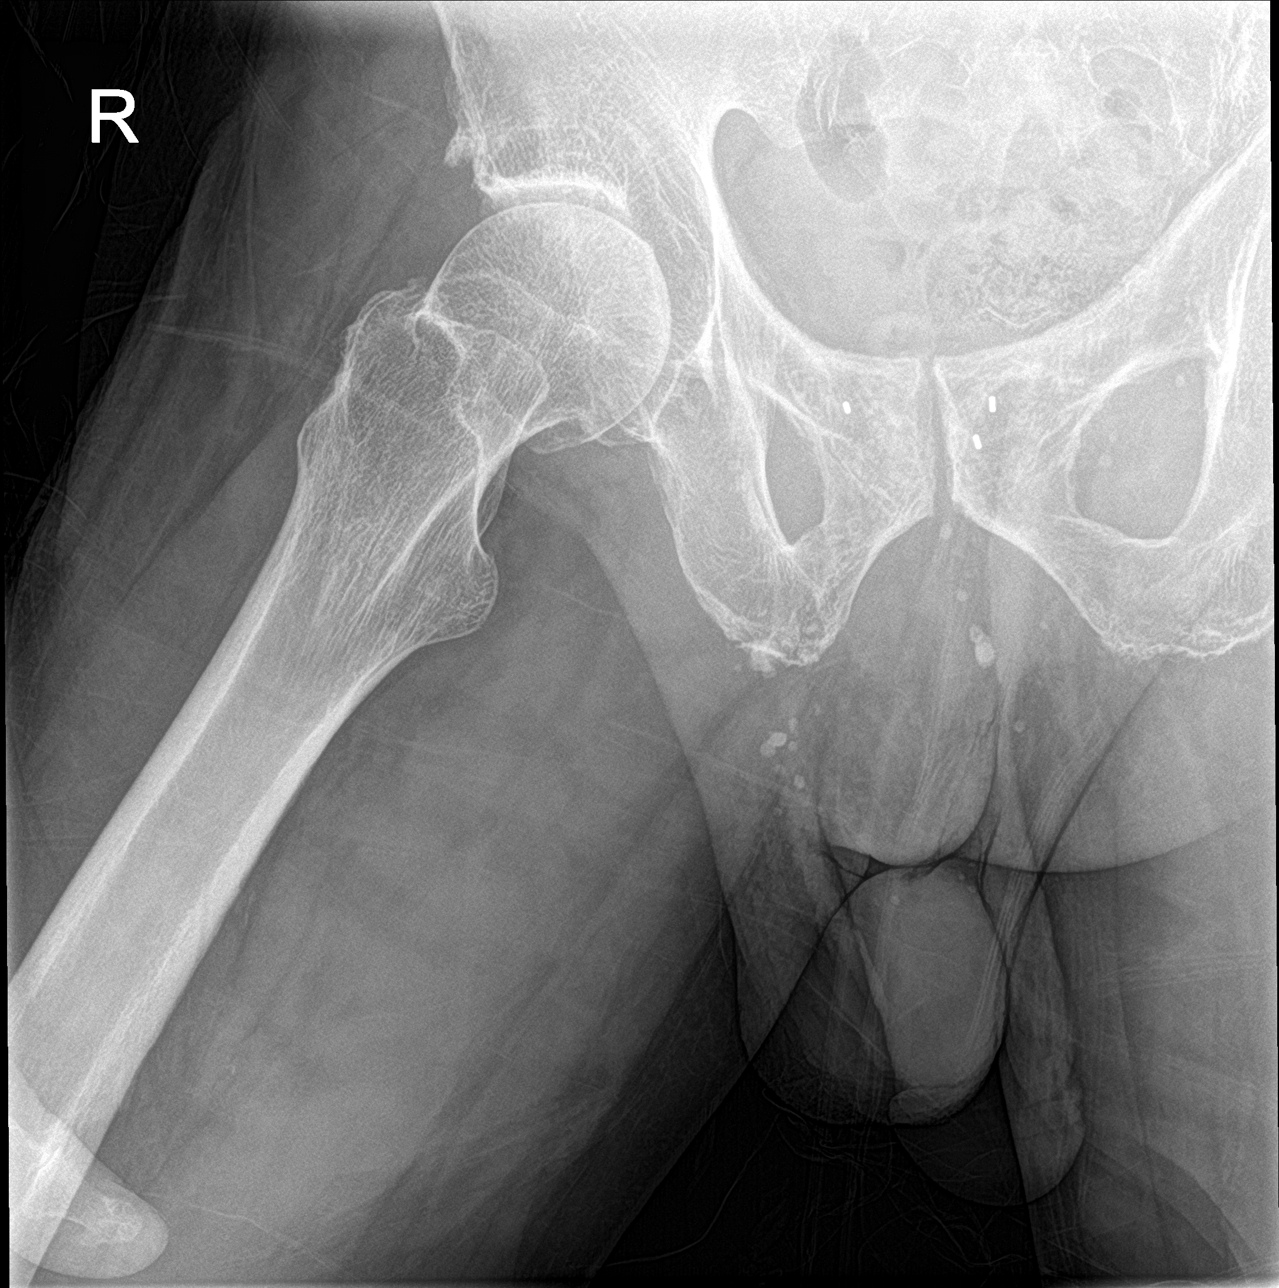

[3 of 3 positions shown; findings below may reference images not displayed]

FINDINGS: Normal alignment. No acute fracture or dislocation. Mild bilateral
degenerative hip arthritis. Brachytherapy seeds overlie the expected
prostate gland.
IMPRESSION: No acute fracture or dislocation.

## 2023-01-22 ENCOUNTER — Other Ambulatory Visit: Payer: Self-pay | Admitting: Adult Health

## 2023-01-24 ENCOUNTER — Ambulatory Visit (INDEPENDENT_AMBULATORY_CARE_PROVIDER_SITE_OTHER): Payer: PPO | Admitting: Internal Medicine

## 2023-01-24 NOTE — Progress Notes (Signed)
Erroneous encounter - please disregard.

## 2023-02-06 ENCOUNTER — Telehealth: Payer: Self-pay

## 2023-02-06 NOTE — Progress Notes (Signed)
  Chronic Care Management Note  02/06/2023 Name: Tremar Wickens MRN: [id number] DOB: 01/24/1941  Kayla Weekes Ackerman is a 82 y.o. year old male who is a primary care patient of Kerri Perches, MD and is actively engaged with the Chronic Care Management team. I reached out to Daphene Calamity by phone today to assist with re-scheduling a follow up visit with the RN Case Manager  Follow up plan: Unsuccessful telephone outreach attempt made. A HIPAA compliant phone message was left for the patient providing contact information and requesting a return call.  The care management team will reach out to the patient again over the next 7 days.  If patient returns call to provider office, please advise to call CCM Care Guide Penne Lash  at [phone]  Penne Lash, RMA Care Guide [facility]  [place], [place] [address] Direct Dial: [phone] [email or web address]

## 2023-02-17 ENCOUNTER — Telehealth: Payer: Self-pay | Admitting: Family Medicine

## 2023-02-17 ENCOUNTER — Other Ambulatory Visit: Payer: Self-pay

## 2023-02-17 DIAGNOSIS — I10 Essential (primary) hypertension: Secondary | ICD-10-CM

## 2023-02-17 DIAGNOSIS — E785 Hyperlipidemia, unspecified: Secondary | ICD-10-CM

## 2023-02-17 MED ORDER — AMLODIPINE BESYLATE 10 MG PO TABS: 10.0000 mg | ORAL_TABLET | Freq: Every day | ORAL | 0 refills | Status: DC

## 2023-02-17 MED ORDER — LOVASTATIN 40 MG PO TABS: 40.0000 mg | ORAL_TABLET | Freq: Every day | ORAL | 0 refills | Status: DC

## 2023-02-17 NOTE — Telephone Encounter (Signed)
Prescription Request  02/17/2023  LOV: 10/11/2022  What is the name of the medication or equipment? lovastatin (MEVACOR) 40 MG tablet   amLODipine (NORVASC) 10 MG tablet   Have you contacted your pharmacy to request a refill? Yes   Which pharmacy would you like this sent to?  [facility] - [place], [place] - [address] [place] [address] Phone: [phone] Fax: [phone]    Patient notified that their request is being sent to the clinical staff for review and that they should receive a response within 2 business days.   Please advise at Mobile [phone] (mobile)    Wants pharm changed to [facility]

## 2023-02-17 NOTE — Telephone Encounter (Signed)
Refill sent.

## 2023-02-19 ENCOUNTER — Telehealth: Payer: Self-pay | Admitting: *Deleted

## 2023-02-19 ENCOUNTER — Telehealth: Payer: PPO

## 2023-02-19 NOTE — Telephone Encounter (Signed)
   CCM RN Visit Note   02/19/23 Name: [name] MRN: [id number]      DOB: 1941/09/08  Subjective: [name] is a 82 y.o. year old male who is a primary care patient of Syliva Overman MD. The patient was referred to the Chronic Care Management team for assistance with care management needs subsequent to provider initiation of CCM services and plan of care.      Second unsuccessful telephone outreach was attempted today to contact the patient about Chronic Care Management needs.    Plan:Telephone follow up appointment with care management team member scheduled for:  upon care guide rescheduling  Irving Shows [facility], BSN RN Case Manager [facility] [phone]

## 2023-03-27 ENCOUNTER — Ambulatory Visit: Payer: PPO | Attending: Internal Medicine | Admitting: Internal Medicine

## 2023-03-27 ENCOUNTER — Encounter: Payer: Self-pay | Admitting: Internal Medicine

## 2023-04-03 ENCOUNTER — Other Ambulatory Visit: Payer: Self-pay

## 2023-04-03 ENCOUNTER — Telehealth: Payer: Self-pay | Admitting: Family Medicine

## 2023-04-03 DIAGNOSIS — I10 Essential (primary) hypertension: Secondary | ICD-10-CM

## 2023-04-03 MED ORDER — AMLODIPINE BESYLATE 10 MG PO TABS: 10.0000 mg | ORAL_TABLET | Freq: Every day | ORAL | 0 refills | Status: DC

## 2023-04-03 NOTE — Telephone Encounter (Signed)
Refill sent.

## 2023-04-03 NOTE — Telephone Encounter (Signed)
Prescription Request  04/03/2023  LOV: 10/11/2022  What is the name of the medication or equipment? amLODipine (NORVASC) 10 MG tablet [[id number]]    Have you contacted your pharmacy to request a refill? Yes   Which pharmacy would you like this sent to?   [facility] - [place], [place] - [address] [place] [address] Phone: [phone] Fax: [phone]     Patient notified that their request is being sent to the clinical staff for review and that they should receive a response within 2 business days.   Please advise at Home [phone]   PT IS OUT OF MED

## 2023-04-16 ENCOUNTER — Ambulatory Visit: Payer: PPO | Admitting: *Deleted

## 2023-04-16 NOTE — Chronic Care Management (AMB) (Signed)
   04/16/2023  [name] 01-25-1941 [id number]     CCM RN Visit Note   04/16/23 Name: [name] MRN: [id number]      DOB: [birth year]  Subjective: [name] is a 82 y.o. year old male who is a primary care patient of Syliva Overman MD. The patient was referred to the Chronic Care Management team for assistance with care management needs subsequent to provider initiation of CCM services and plan of care.      Third unsuccessful telephone outreach was attempted today to contact the patient about Chronic Care Management needs.The patient was referred to the case management team by the provider who initiated CCM services. The provider has been notified of our unsuccessful attempts to make or maintain contact with the patient.   Plan:No further follow up required: case closure due to unable to maintain contact  Irving Shows [facility], BSN RN Case Physicist, medical Primary Care [phone]

## 2023-04-22 ENCOUNTER — Encounter: Payer: Self-pay | Admitting: Family Medicine

## 2023-04-22 ENCOUNTER — Ambulatory Visit (INDEPENDENT_AMBULATORY_CARE_PROVIDER_SITE_OTHER): Payer: PPO | Admitting: Family Medicine

## 2023-04-22 VITALS — BP 147/88 | HR 69 | Ht 67.0 in | Wt 137.0 lb

## 2023-04-22 DIAGNOSIS — I493 Ventricular premature depolarization: Secondary | ICD-10-CM

## 2023-04-22 DIAGNOSIS — E559 Vitamin D deficiency, unspecified: Secondary | ICD-10-CM | POA: Diagnosis not present

## 2023-04-22 DIAGNOSIS — R7303 Prediabetes: Secondary | ICD-10-CM | POA: Diagnosis not present

## 2023-04-22 DIAGNOSIS — E039 Hypothyroidism, unspecified: Secondary | ICD-10-CM

## 2023-04-22 DIAGNOSIS — I428 Other cardiomyopathies: Secondary | ICD-10-CM | POA: Insufficient documentation

## 2023-04-22 DIAGNOSIS — D126 Benign neoplasm of colon, unspecified: Secondary | ICD-10-CM | POA: Diagnosis not present

## 2023-04-22 DIAGNOSIS — E785 Hyperlipidemia, unspecified: Secondary | ICD-10-CM

## 2023-04-22 DIAGNOSIS — R634 Abnormal weight loss: Secondary | ICD-10-CM

## 2023-04-22 NOTE — Assessment & Plan Note (Signed)
Hyperlipidemia:Low fat diet discussed and encouraged.   Lipid Panel  Lab Results  Component Value Date   CHOL 154 10/10/2022   HDL 66 10/10/2022   LDLCALC 75 10/10/2022   TRIG 64 10/10/2022   CHOLHDL 2.3 10/10/2022     Updated lab needed at/ before next visit.

## 2023-04-22 NOTE — Assessment & Plan Note (Signed)
Patient educated about the importance of limiting  Carbohydrate intake , the need to commit to daily physical activity for a minimum of 30 minutes , and to commit weight loss. The fact that changes in all these areas will reduce or eliminate all together the development of diabetes is stressed.      Latest Ref Rng & Units 10/10/2022   11:43 AM 07/25/2022    1:47 PM 05/09/2022    3:40 PM 04/29/2022   11:52 AM 04/24/2022   10:52 AM  Diabetic Labs  HbA1c 4.8 - 5.6 % 6.2       Chol 100 - 199 mg/dL 086       HDL >57 mg/dL 66       Calc LDL 0 - 99 mg/dL 75       Triglycerides 0 - 149 mg/dL 64       Creatinine 8.46 - 1.27 mg/dL 9.62  9.52  8.41  3.24  1.41       04/22/2023    1:14 PM 04/22/2023    1:13 PM 12/24/2022   11:49 AM 12/04/2022   11:34 AM 10/25/2022    8:01 AM 10/11/2022    1:11 PM 07/25/2022    1:01 PM  BP/Weight  Systolic BP 147 146 116 134 120 130 110  Diastolic BP 88 91 84 76 62 80 70  Wt. (Lbs)  137.04 138  148 149 149  BMI  21.46 kg/m2 21.61 kg/m2  23.18 kg/m2 23.34 kg/m2 23.34 kg/m2      03/21/2010   12:00 AM  Foot/eye exam completion dates  Foot exam Order yes     Updated lab needed at/ before next visit.

## 2023-04-22 NOTE — Assessment & Plan Note (Signed)
Xclass 1 symptoms, started on amidorone in 12/2022, reorts it makes him tired, mised f/u with card, he is hrged to reschedule as attempt I being made to strengthen his heart and needs to be monitored, he will schedule after leaving appt today here

## 2023-04-22 NOTE — Patient Instructions (Addendum)
F/U in 6 to 8  weeks re evaluate blood pressure and general health, please bring ALL medications you are taking to the visit   Labs today,CBC, lipid, cmp and EGFR, HBA1c, TSH and vit D  Need to take the spironolactone as prescribed, blood pressure is high  Please reschedule your appointment with Dr Ladona Ridgel, VERY important that you keep this appointment regarding your heart.  After you leave this office please go across to the cardiology office and schedule that appointment.  Your colonoscopy is showing that this is past due and you have concerns  about weight loss and poor appetite therefore it is very important that you call and schedule that appointment as well  Thanks for choosing [facility], we consider it a privelige to serve you.

## 2023-04-22 NOTE — Assessment & Plan Note (Signed)
Reports uninentional eighht los , urged to follow though on past due colonoscopy, will refer again

## 2023-04-22 NOTE — Progress Notes (Signed)
[name]     MRN: [id number]      DOB: Dec 24, 1940  Chief Complaint  Patient presents with   Hypertension    Follow up    HPI [name] is here for follow up and re-evaluation of chronic medical conditions, medication management and review of any available recent lab and radiology data.  Preventive health is updated, specifically  Cancer screening and Immunization.   Questions or concerns regarding consultations or procedures which the PT has had in the interim are  addressed. C/o intolerance to amiodorone, makes him tired, asa result also not taking spironolactone daily as prescribed Will reschedule Cardiology f/u to address these concerns  ROS Denies recent fever or chills. Denies sinus pressure, nasal congestion, ear pain or sore throat. Denies chest congestion, productive cough or wheezing. Denies chest pains, palpitations and leg swelling Denies abdominal pain, nausea, vomiting,diarrhea or constipation.   Denies dysuria, frequency, hesitancy or incontinence. Denies uncontrolled  joint pain, swelling and limitation in mobility. Denies headaches, seizures, numbness, or tingling. Denies depression, anxiety or insomnia. Denies skin break down or rash.   PE  BP (!) 147/88   Pulse 69   Ht 5\' 7"  (1.702 m)   Wt 137 lb 0.6 oz (62.2 kg)   SpO2 93%   BMI 21.46 kg/m   Patient alert and oriented and in no cardiopulmonary distress.  HEENT: No facial asymmetry, EOMI,     Neck supple .  Chest: Clear to auscultation bilaterally.  CVS: S1, S2 no murmurs, no S3.Regular rate.  ABD: Soft non tender.   Ext: No edema  MS: Adequate though reduced  ROM spine, shoulders, hips and knees.  Skin: Intact, no ulcerations or rash noted.  Psych: Good eye contact, normal affect. Memory intact not anxious or depressed appearing.  CNS: CN 2-12 intact, power,  normal throughout.no focal deficits noted.   Assessment & Plan  Non-ischemic cardiomyopathy (HCC) Xclass 1  symptoms, started on amidorone in 12/2022, reorts it makes him tired, mised f/u with card, he is hrged to reschedule as attempt I being made to strengthen his heart and needs to be monitored, he will schedule after leaving appt today here  Prediabetes Patient educated about the importance of limiting  Carbohydrate intake , the need to commit to daily physical activity for a minimum of 30 minutes , and to commit weight loss. The fact that changes in all these areas will reduce or eliminate all together the development of diabetes is stressed.      Latest Ref Rng & Units 10/10/2022   11:43 AM 07/25/2022    1:47 PM 05/09/2022    3:40 PM 04/29/2022   11:52 AM 04/24/2022   10:52 AM  Diabetic Labs  HbA1c 4.8 - 5.6 % 6.2       Chol 100 - 199 mg/dL 409       HDL >81 mg/dL 66       Calc LDL 0 - 99 mg/dL 75       Triglycerides 0 - 149 mg/dL 64       Creatinine 1.91 - 1.27 mg/dL 4.78  2.95  6.21  3.08  1.41       04/22/2023    1:14 PM 04/22/2023    1:13 PM 12/24/2022   11:49 AM 12/04/2022   11:34 AM 10/25/2022    8:01 AM 10/11/2022    1:11 PM 07/25/2022    1:01 PM  BP/Weight  Systolic BP 147 146 116 134 120 130 110  Diastolic BP 88 91 84 76 62 80 70  Wt. (Lbs)  137.04 138  148 149 149  BMI  21.46 kg/m2 21.61 kg/m2  23.18 kg/m2 23.34 kg/m2 23.34 kg/m2      03/21/2010   12:00 AM  Foot/eye exam completion dates  Foot exam Order yes     Updated lab needed at/ before next visit.   Tubular adenoma of colon Reports uninentional eighht los , urged to follow though on past due colonoscopy, will refer again  Hyperlipidemia LDL goal <100 Hyperlipidemia:Low fat diet discussed and encouraged.   Lipid Panel  Lab Results  Component Value Date   CHOL 154 10/10/2022   HDL 66 10/10/2022   LDLCALC 75 10/10/2022   TRIG 64 10/10/2022   CHOLHDL 2.3 10/10/2022     Updated lab needed at/ before next visit.   Hypothyroidism Updated lab needed at/ before next visit.

## 2023-04-22 NOTE — Assessment & Plan Note (Signed)
Updated lab needed at/ before next visit.   

## 2023-04-23 LAB — CMP14+EGFR
ALT: 9 IU/L (ref 0–44)
AST: 16 IU/L (ref 0–40)
Albumin: 4.4 g/dL (ref 3.7–4.7)
Alkaline Phosphatase: 61 IU/L (ref 44–121)
BUN/Creatinine Ratio: 11 (ref 10–24)
BUN: 17 mg/dL (ref 8–27)
Bilirubin Total: 0.8 mg/dL (ref 0.0–1.2)
CO2: 23 mmol/L (ref 20–29)
Calcium: 9.5 mg/dL (ref 8.6–10.2)
Chloride: 104 mmol/L (ref 96–106)
Creatinine, Ser: 1.51 mg/dL — ABNORMAL HIGH (ref 0.76–1.27)
Globulin, Total: 2.6 g/dL (ref 1.5–4.5)
Glucose: 79 mg/dL (ref 70–99)
Potassium: 4.3 mmol/L (ref 3.5–5.2)
Sodium: 146 mmol/L — ABNORMAL HIGH (ref 134–144)
Total Protein: 7 g/dL (ref 6.0–8.5)
eGFR: 45 mL/min/{1.73_m2} — ABNORMAL LOW (ref >59)

## 2023-04-23 LAB — LIPID PANEL
Chol/HDL Ratio: 2.2 ratio (ref 0.0–5.0)
Cholesterol, Total: 169 mg/dL (ref 100–199)
HDL: 77 mg/dL (ref >39)
LDL Chol Calc (NIH): 80 mg/dL (ref 0–99)
Triglycerides: 58 mg/dL (ref 0–149)
VLDL Cholesterol Cal: 12 mg/dL (ref 5–40)

## 2023-04-23 LAB — CBC WITH DIFFERENTIAL/PLATELET
Basophils Absolute: 0 10*3/uL (ref 0.0–0.2)
Basos: 0 %
EOS (ABSOLUTE): 0.1 10*3/uL (ref 0.0–0.4)
Eos: 2 %
Hematocrit: 44.7 % (ref 37.5–51.0)
Hemoglobin: 14.3 g/dL (ref 13.0–17.7)
Immature Grans (Abs): 0.1 10*3/uL (ref 0.0–0.1)
Immature Granulocytes: 1 %
Lymphocytes Absolute: 1 10*3/uL (ref 0.7–3.1)
Lymphs: 20 %
MCH: 28.3 pg (ref 26.6–33.0)
MCHC: 32 g/dL (ref 31.5–35.7)
MCV: 89 fL (ref 79–97)
Monocytes Absolute: 0.4 10*3/uL (ref 0.1–0.9)
Monocytes: 8 %
Neutrophils Absolute: 3.6 10*3/uL (ref 1.4–7.0)
Neutrophils: 69 %
Platelets: 184 10*3/uL (ref 150–450)
RBC: 5.05 x10E6/uL (ref 4.14–5.80)
RDW: 14.2 % (ref 11.6–15.4)
WBC: 5.2 10*3/uL (ref 3.4–10.8)

## 2023-04-23 LAB — VITAMIN D 25 HYDROXY (VIT D DEFICIENCY, FRACTURES): Vit D, 25-Hydroxy: 40.5 ng/mL (ref 30.0–100.0)

## 2023-04-23 LAB — TSH: TSH: 7.78 u[IU]/mL — ABNORMAL HIGH (ref 0.450–4.500)

## 2023-04-23 LAB — HEMOGLOBIN A1C
Est. average glucose Bld gHb Est-mCnc: 128 mg/dL
Hgb A1c MFr Bld: 6.1 % — ABNORMAL HIGH (ref 4.8–5.6)

## 2023-04-23 MED ORDER — LEVOTHYROXINE SODIUM 75 MCG PO TABS: ORAL_TABLET | ORAL | 1 refills | Status: DC

## 2023-04-23 NOTE — Addendum Note (Signed)
Addended by: Kerri Perches on: 04/23/2023 06:09 AM   Modules accepted: Orders

## 2023-05-05 ENCOUNTER — Other Ambulatory Visit: Payer: Self-pay | Admitting: Adult Health

## 2023-05-15 ENCOUNTER — Ambulatory Visit (INDEPENDENT_AMBULATORY_CARE_PROVIDER_SITE_OTHER): Payer: PPO | Admitting: Gastroenterology

## 2023-05-15 ENCOUNTER — Ambulatory Visit: Payer: PPO | Attending: Internal Medicine | Admitting: Internal Medicine

## 2023-05-15 ENCOUNTER — Ambulatory Visit: Payer: PPO | Attending: Internal Medicine

## 2023-05-15 VITALS — BP 146/90 | HR 68 | Ht 67.0 in | Wt 140.2 lb

## 2023-05-15 DIAGNOSIS — I10 Essential (primary) hypertension: Secondary | ICD-10-CM | POA: Diagnosis not present

## 2023-05-15 DIAGNOSIS — I428 Other cardiomyopathies: Secondary | ICD-10-CM | POA: Diagnosis not present

## 2023-05-15 DIAGNOSIS — I493 Ventricular premature depolarization: Secondary | ICD-10-CM

## 2023-05-15 DIAGNOSIS — I499 Cardiac arrhythmia, unspecified: Secondary | ICD-10-CM | POA: Diagnosis not present

## 2023-05-15 DIAGNOSIS — I5022 Chronic systolic (congestive) heart failure: Secondary | ICD-10-CM

## 2023-05-15 NOTE — Patient Instructions (Signed)
Medication Instructions:  Your physician recommends that you continue on your current medications as directed. Please refer to the Current Medication list given to you today.  *If you need a refill on your cardiac medications before your next appointment, please call your pharmacy*   Lab Work: NONE   If you have labs (blood work) drawn today and your tests are completely normal, you will receive your results only by: MyChart Message (if you have MyChart) OR A paper copy in the mail If you have any lab test that is abnormal or we need to change your treatment, we will call you to review the results.   Testing/Procedures: Your physician has requested that you have an echocardiogram. Echocardiography is a painless test that uses sound waves to create images of your heart. It provides your doctor with information about the size and shape of your heart and how well your heart's chambers and valves are working. This procedure takes approximately one hour. There are no restrictions for this procedure. Please do NOT wear cologne, perfume, aftershave, or lotions (deodorant is allowed). Please arrive 15 minutes prior to your appointment time.  ZIO XT- Long Term Monitor Instructions   Your physician has requested you wear your ZIO patch monitor___3____days.   This is a single patch monitor.  [employer] supplies one patch monitor per enrollment.  Additional stickers are not available.   Please do not apply patch if you will be having a Nuclear Stress Test, Echocardiogram, Cardiac CT, MRI, or Chest Xray during the time frame you would be wearing the monitor. The patch cannot be worn during these tests.  You cannot remove and re-apply the ZIO XT patch monitor.   Your ZIO patch monitor will be sent USPS Priority mail from [facility] directly to your home address. The monitor may also be mailed to a PO BOX if home delivery is not available.   It may take 3-5 days to receive your monitor after you  have been enrolled.   Once you have received you monitor, please review enclosed instructions.  Your monitor has already been registered assigning a specific monitor serial # to you.   Applying the monitor   Shave hair from upper left chest.   Hold abrader disc by orange tab.  Rub abrader in 40 strokes over left upper chest as indicated in your monitor instructions.   Clean area with 4 enclosed alcohol pads .  Use all pads to assure are is cleaned thoroughly.  Let dry.   Apply patch as indicated in monitor instructions.  Patch will be place under collarbone on left side of chest with arrow pointing upward.   Rub patch adhesive wings for 2 minutes.Remove white label marked "1".  Remove white label marked "2".  Rub patch adhesive wings for 2 additional minutes.   While looking in a mirror, press and release button in center of patch.  A small green light will flash 3-4 times .  This will be your only indicator the monitor has been turned on.     Do not shower for the first 24 hours.  You may shower after the first 24 hours.   Press button if you feel a symptom. You will hear a small click.  Record Date, Time and Symptom in the Patient Log Book.   When you are ready to remove patch, follow instructions on last 2 pages of Patient Log Book.  Stick patch monitor onto last page of Patient Log Book.   Place Patient Log  Book in [facility] box.  Use locking tab on box and tape box closed securely.  The [employer] and [employer] has [employer] on it.  Please place in mailbox as soon as possible.  Your physician should have your test results approximately 7 days after the monitor has been mailed back to [facility].   Call [facility] Customer Care at [phone] if you have questions regarding your ZIO XT patch monitor.  Call them immediately if you see an orange light blinking on your monitor.   If your monitor falls off in less than 4 days contact our Monitor department at [phone].  If your  monitor becomes loose or falls off after 4 days call [employer] at [phone] for suggestions on securing your monitor.     Follow-Up: At [facility], you and your health needs are our priority.  As part of our continuing mission to provide you with exceptional heart care, we have created designated Provider Care Teams.  These Care Teams include your primary Cardiologist (physician) and Advanced Practice Providers (APPs -  Physician Assistants and Nurse Practitioners) who all work together to provide you with the care you need, when you need it.  We recommend signing up for the patient portal called "MyChart".  Sign up information is provided on this After Visit Summary.  MyChart is used to connect with patients for Virtual Visits (Telemedicine).  Patients are able to view lab/test results, encounter notes, upcoming appointments, etc.  Non-urgent messages can be sent to your provider as well.   To learn more about what you can do with MyChart, go to [email or web address].    Your next appointment:   3 month(s)  Provider:   Lewayne Bunting, MD    Other Instructions Thank you for choosing  [facility]!

## 2023-05-15 NOTE — Progress Notes (Signed)
HPI [name] returns for ongoing evaluation of PVC's. He has a h/o non-ischemic CM, and has class 1 symtoms on maximal medical therapy. The patient has not had syncope. He does not feel his palpitaitons. He had a 25% burden and I started him on amiodarone.  He has more than one morphology. He has been on a beta blocker. His EF was 25% in the setting of the PVC's. He feels well though his appetite is not as good.  Allergies  Allergen Reactions   Other Other (See Comments) and Shortness Of Breath   Peanut-Containing Drug Products Shortness Of Breath and Swelling   Egg-Derived Products Swelling   Iodine Swelling   Shellfish Allergy Swelling   Penicillins Hives, Rash and Other (See Comments)    Has patient had a PCN reaction causing immediate rash, facial/tongue/throat swelling, SOB or lightheadedness with hypotension: No  Has patient had a PCN reaction causing severe rash involving mucus membranes or skin necrosis: Yes  Has patient had a PCN reaction that required hospitalization No  Has patient had a PCN reaction occurring within the last 10 years: No  If all of the above answers are "NO", then may proceed with Cephalosporin use.  Has patient had a PCN reaction causing immediate rash, facial/tongue/throat swelling, SOB or lightheadedness with hypotension: No, Has patient had a PCN reaction causing severe rash involving mucus membranes or skin necrosis: Yes, Has patient had a PCN reaction that required hospitalization No, Has patient had a PCN reaction occurring within the last 10 years: No, If all of the above answers are "NO", then may proceed with Cephalosporin use.     Current Outpatient Medications  Medication Sig Dispense Refill   amLODipine (NORVASC) 10 MG tablet Take 1 tablet (10 mg total) by mouth daily. 90 tablet 0   aspirin EC 81 MG tablet Take 1 tablet (81 mg total) by mouth daily. 30 tablet 5   cetirizine (ZYRTEC) 10 MG tablet TAKE 1 TABLET EVERY DAY 90 tablet 10    dapagliflozin propanediol (FARXIGA) 10 MG TABS tablet Take 1 tablet (10 mg total) by mouth daily before breakfast. 90 tablet 5   EPINEPHrine 0.3 mg/0.3 mL IJ SOAJ injection Inject 0.3 mLs (0.3 mg total) into the muscle as needed for anaphylaxis. 1 each 2   levothyroxine (SYNTHROID) 75 MCG tablet Take one and a half tablets every  Monday, Wednesday, Friday and Sunday, and take one tablet every Tuesday, Thursday and Saturday 108 tablet 1   losartan (COZAAR) 50 MG tablet TAKE 1 TABLET BY MOUTH ONCE DAILY. STOP TAKING ENTRESTO. 90 tablet 0   lovastatin (MEVACOR) 40 MG tablet Take 1 tablet (40 mg total) by mouth daily. 90 tablet 0   metoprolol succinate (TOPROL-XL) 50 MG 24 hr tablet TAKE ONE TABLET BY MOUTH ONCE DAILY WITH MEAL. 90 tablet 0   Multiple Vitamin (MULTIVITAMIN WITH MINERALS) TABS tablet Take 1 tablet by mouth daily.     tadalafil (CIALIS) 5 MG tablet Take 1 tablet (5 mg total) by mouth daily. 90 tablet 3   tamsulosin (FLOMAX) 0.4 MG CAPS capsule Take 1 capsule (0.4 mg total) by mouth daily. 30 capsule 5   amiodarone (PACERONE) 200 MG tablet Take 1 tablet (200 mg total) by mouth 2 (two) times daily. 90 tablet 3   spironolactone (ALDACTONE) 25 MG tablet Take 0.5 tablets (12.5 mg total) by mouth daily. 45 tablet 3   No current facility-administered medications for this visit.     Past Medical  History:  Diagnosis Date   Allergic rhinitis, seasonal    BPH (benign prostatic hypertrophy)    Erectile dysfunction    History of kidney stones    Hx of thyroidectomy    Hyperlipidemia    Hypertension    Hypothyroidism    Pre-diabetes    Prostate cancer (HCC) 2011   treated with radiation   Urosepsis 07/2011   ICU admission [facility]    ROS:   All systems reviewed and negative except as noted in the HPI.   Past Surgical History:  Procedure Laterality Date   COLONOSCOPY N/A 09/03/2013   Procedure: COLONOSCOPY;  Surgeon: Malissa Hippo, MD;  Location: [facility];  Service:  Endoscopy;  Laterality: N/A;  1225   COLONOSCOPY N/A 11/08/2015   Procedure: COLONOSCOPY;  Surgeon: Malissa Hippo, MD;  Location: [facility];  Service: Endoscopy;  Laterality: N/A;  1200   MASS EXCISION N/A 08/15/2017   Procedure: EXCISION SEBACEOUS CYST NECK;  Surgeon: Franky Macho, MD;  Location: [facility] ORS;  Service: General;  Laterality: N/A;   PROSTATE SURGERY     RIGHT/LEFT HEART CATH AND CORONARY ANGIOGRAPHY N/A 04/30/2022   Procedure: RIGHT/LEFT HEART CATH AND CORONARY ANGIOGRAPHY;  Surgeon: Swaz[place], Peter M, MD;  Location: [facility] INVASIVE CV LAB;  Service: Cardiovascular;  Laterality: N/A;   THYROIDECTOMY  2003   transurethral resection of thr prostate  2003   ureteral stone extraction  08/2011     Family History  Problem Relation Age of Onset   Stroke Mother    Diabetes Father    Hypertension Father    Kidney disease Brother    Stroke Brother    Diabetes Brother    Gout Brother    Diabetes Brother      Social History   Socioeconomic History   Marital status: Married    Spouse name: Not on file   Number of children: 5   Years of education: Not on file   Highest education level: Not on file  Occupational History   Occupation: retired  Tobacco Use   Smoking status: Never    Passive exposure: Never   Smokeless tobacco: Never  Vaping Use   Vaping status: Never Used  Substance and Sexual Activity   Alcohol use: No    Alcohol/week: 0.0 standard drinks of alcohol   Drug use: No   Sexual activity: Yes  Other Topics Concern   Not on file  Social History Narrative   Not on file   Social Determinants of Health   Financial Resource Strain: Low Risk  (11/11/2022)   Overall Financial Resource Strain (CARDIA)    Difficulty of Paying Living Expenses: Not very hard  Food Insecurity: No Food Insecurity (11/11/2022)   Hunger Vital Sign    Worried About Running Out of Food in the Last Year: Never true    Ran Out of Food in the Last Year: Never true  Transportation Needs: No  Transportation Needs (11/11/2022)   PRAPARE - Administrator, Civil Service (Medical): No    Lack of Transportation (Non-Medical): No  Physical Activity: Insufficiently Active (11/11/2022)   Exercise Vital Sign    Days of Exercise per Week: 3 days    Minutes of Exercise per Session: 20 min  Stress: No Stress Concern Present (11/11/2022)   [employer] of Occupational Health - Occupational Stress Questionnaire    Feeling of Stress : Not at all  Social Connections: Moderately Integrated (11/11/2022)   Social Connection and Isolation Panel [  NHANES]    Frequency of Communication with Friends and Family: More than three times a week    Frequency of Social Gatherings with Friends and Family: More than three times a week    Attends Religious Services: More than 4 times per year    Active Member of [employer] or Organizations: No    Attends Banker Meetings: Never    Marital Status: Married  Catering manager Violence: Not At Risk (11/11/2022)   Humiliation, Afraid, Rape, and Kick questionnaire    Fear of Current or Ex-Partner: No    Emotionally Abused: No    Physically Abused: No    Sexually Abused: No     BP (!) 146/90 (BP Location: Left Arm, Patient Position: Sitting, Cuff Size: Normal)   Pulse 68   Ht 5\' 7"  (1.702 m)   Wt 140 lb 3.2 oz (63.6 kg)   SpO2 98%   BMI 21.96 kg/m   Physical Exam:  Well appearing NAD HEENT: Unremarkable Neck:  No JVD, no thyromegally Lymphatics:  No adenopathy Back:  No CVA tenderness Lungs:  Clear with no wheezes HEART:  Regular rate rhythm, no murmurs, no rubs, no clicks Abd:  soft, positive bowel sounds, no organomegally, no rebound, no guarding Ext:  2 plus pulses, no edema, no cyanosis, no clubbing Skin:  No rashes no nodules Neuro:  CN II through XII intact, motor grossly intact  EKG - nsr  Assess/Plan:  Non-ischemic CM - I am not convinced his CM is due to PVC. However we will attempt to suppress and then repeat his  echo. He will continue GDMT. PVC's - I discussed the treatment options. He appears on exam to have suppressed. I will ask him to wear another 3 day zio and if PVC's are suppressed repeat the echo. Additional recs will be based on the echo and the zio. For now continue amio 200 mg daily, though I plan to reduce the dose. If no improvement in the EF, we will stop the amio.  Paul Gowda Rayvion Stumph,MD

## 2023-05-22 DIAGNOSIS — I493 Ventricular premature depolarization: Secondary | ICD-10-CM | POA: Diagnosis not present

## 2023-06-06 ENCOUNTER — Ambulatory Visit: Payer: PPO | Admitting: Family Medicine

## 2023-06-10 ENCOUNTER — Encounter: Payer: Self-pay | Admitting: Family Medicine

## 2023-06-10 ENCOUNTER — Ambulatory Visit: Payer: PPO | Admitting: Family Medicine

## 2023-06-10 VITALS — BP 146/79 | HR 69 | Ht 67.0 in | Wt 141.0 lb

## 2023-06-10 DIAGNOSIS — R7303 Prediabetes: Secondary | ICD-10-CM

## 2023-06-10 DIAGNOSIS — Z0000 Encounter for general adult medical examination without abnormal findings: Secondary | ICD-10-CM

## 2023-06-10 DIAGNOSIS — I10 Essential (primary) hypertension: Secondary | ICD-10-CM | POA: Diagnosis not present

## 2023-06-10 DIAGNOSIS — I428 Other cardiomyopathies: Secondary | ICD-10-CM

## 2023-06-10 DIAGNOSIS — I5022 Chronic systolic (congestive) heart failure: Secondary | ICD-10-CM | POA: Diagnosis not present

## 2023-06-10 DIAGNOSIS — E039 Hypothyroidism, unspecified: Secondary | ICD-10-CM

## 2023-06-10 DIAGNOSIS — R634 Abnormal weight loss: Secondary | ICD-10-CM | POA: Diagnosis not present

## 2023-06-10 DIAGNOSIS — E785 Hyperlipidemia, unspecified: Secondary | ICD-10-CM

## 2023-06-10 DIAGNOSIS — Z125 Encounter for screening for malignant neoplasm of prostate: Secondary | ICD-10-CM

## 2023-06-10 NOTE — Patient Instructions (Addendum)
pLease schedule AWV at checkout  Annual exam with mD early January, call if you need me sooner  You are referred to GI re weight loss and overdue colonoscopy, NEED TO gO!  Please satrt using pill box to keep up with meds  Will contact you re flu vaccine   Fasting cmp and EGFR, lipid panel and TSH , also PSA Jan 2 or shortly after  Thanks for choosing [facility], we consider it a privelige to serve you.

## 2023-06-10 NOTE — Progress Notes (Signed)
[name]     MRN: [id number]      DOB: 06-09-1941  Chief Complaint  Patient presents with   Hypertension    Follow up for Hypertension   Hyperlipidemia    Follow up for Hyperlipidemia   Weight Loss    Unintended weight loss.    HPI [name] is here for follow up and re-evaluation of chronic medical conditions, medication management and review of any available recent lab and radiology data.  Preventive health is updated, specifically  Cancer screening and Immunization.  Colonoscopy is PAST due and this has been discussed severeal times in  the recent past, states he is busy taking care of everyone  else Questions or concerns regarding consultations or procedures which the PT has had in the interim are  addressed. The PT denies any adverse reactions to current medications since the last visit.  Has above concerns mentioned ROS Denies recent fever or chills. Denies sinus pressure, nasal congestion, ear pain or sore throat. Denies chest congestion, productive cough or wheezing. Denies chest pains, palpitations and leg swelling Denies abdominal pain, nausea, vomiting,diarrhea or constipation.   Denies dysuria, frequency, hesitancy or incontinence. Chronic  joint pain, swelling and limitation in mobility. Denies headaches, seizures, numbness, or tingling. Denies depression, anxiety or insomnia. Denies skin break down or rash.   PE  BP (!) 146/79 (BP Location: Left Arm, Patient Position: Sitting, Cuff Size: Normal)   Pulse 69   Ht 5\' 7"  (1.702 m)   Wt 141 lb 0.6 oz (64 kg)   SpO2 96%   BMI 22.09 kg/m   Patient alert and oriented and in no cardiopulmonary distress.  HEENT: No facial asymmetry, EOMI,     Neck supple .  Chest: Clear to auscultation bilaterally.  CVS: S1, S2 no murmurs, no S3.Regular rate.  ABD: Soft non tender.   Ext: No edema  MS: Adequate ROM spine, shoulders, hips and knees.  Skin: Intact, no ulcerations or rash noted.  Psych: Good  eye contact, normal affect. Memory intact not anxious or depressed appearing.  CNS: CN 2-12 intact, power,  normal throughout.no focal deficits noted.   Assessment & Plan  Essential hypertension Controlled, no change in medication DASH diet and commitment to daily physical activity for a minimum of 30 minutes discussed and encouraged, as a part of hypertension management. The importance of attaining a healthy weight is also discussed.     06/10/2023    2:09 PM 06/10/2023    2:00 PM 05/15/2023    1:45 PM 04/22/2023    1:14 PM 04/22/2023    1:13 PM 12/24/2022   11:49 AM 12/04/2022   11:34 AM  BP/Weight  Systolic BP 146 147 146 147 146 116 134  Diastolic BP 79 86 90 88 91 84 76  Wt. (Lbs)  141.04 140.2  137.04 138   BMI  22.09 kg/m2 21.96 kg/m2  21.46 kg/m2 21.61 kg/m2        Chronic systolic CHF (congestive heart failure) (HCC) Being treated by Cardiology, encouraged to keep close follow up  Hypothyroidism Undercorrected, worsened , must verify med dose taking and also how he is taking as on empty stomach  Hyperlipidemia LDL goal <100 Controlled, no change in medication Hyperlipidemia:Low fat diet discussed and encouraged.   Lipid Panel  Lab Results  Component Value Date   CHOL 157 06/11/2023   HDL 82 06/11/2023   LDLCALC 63 06/11/2023   TRIG 58 06/11/2023   CHOLHDL 1.9 06/11/2023  Non-ischemic cardiomyopathy (HCC) Followed closely by Cardiology  Unintentional weight loss Refer GI , also colonoscopy past due x 4 years

## 2023-06-11 DIAGNOSIS — I10 Essential (primary) hypertension: Secondary | ICD-10-CM | POA: Diagnosis not present

## 2023-06-11 DIAGNOSIS — E785 Hyperlipidemia, unspecified: Secondary | ICD-10-CM | POA: Diagnosis not present

## 2023-06-11 DIAGNOSIS — Z125 Encounter for screening for malignant neoplasm of prostate: Secondary | ICD-10-CM | POA: Diagnosis not present

## 2023-06-11 DIAGNOSIS — E039 Hypothyroidism, unspecified: Secondary | ICD-10-CM | POA: Diagnosis not present

## 2023-06-12 LAB — LIPID PANEL
Chol/HDL Ratio: 1.9 ratio (ref 0.0–5.0)
Cholesterol, Total: 157 mg/dL (ref 100–199)
HDL: 82 mg/dL (ref >39)
LDL Chol Calc (NIH): 63 mg/dL (ref 0–99)
Triglycerides: 58 mg/dL (ref 0–149)
VLDL Cholesterol Cal: 12 mg/dL (ref 5–40)

## 2023-06-12 LAB — CMP14+EGFR
ALT: 8 IU/L (ref 0–44)
AST: 14 IU/L (ref 0–40)
Albumin: 4.1 g/dL (ref 3.7–4.7)
Alkaline Phosphatase: 70 IU/L (ref 44–121)
BUN/Creatinine Ratio: 13 (ref 10–24)
BUN: 21 mg/dL (ref 8–27)
Bilirubin Total: 0.6 mg/dL (ref 0.0–1.2)
CO2: 21 mmol/L (ref 20–29)
Calcium: 9.2 mg/dL (ref 8.6–10.2)
Chloride: 108 mmol/L — ABNORMAL HIGH (ref 96–106)
Creatinine, Ser: 1.59 mg/dL — ABNORMAL HIGH (ref 0.76–1.27)
Globulin, Total: 2.8 g/dL (ref 1.5–4.5)
Glucose: 95 mg/dL (ref 70–99)
Potassium: 4.1 mmol/L (ref 3.5–5.2)
Sodium: 147 mmol/L — ABNORMAL HIGH (ref 134–144)
Total Protein: 6.9 g/dL (ref 6.0–8.5)
eGFR: 43 mL/min/{1.73_m2} — ABNORMAL LOW (ref >59)

## 2023-06-12 LAB — TSH: TSH: 10.2 u[IU]/mL — ABNORMAL HIGH (ref 0.450–4.500)

## 2023-06-12 LAB — PSA: Prostate Specific Ag, Serum: <0.1 ng/mL (ref 0.0–4.0)

## 2023-06-16 ENCOUNTER — Encounter: Payer: Self-pay | Admitting: Family Medicine

## 2023-06-16 DIAGNOSIS — R634 Abnormal weight loss: Secondary | ICD-10-CM | POA: Insufficient documentation

## 2023-06-16 NOTE — Assessment & Plan Note (Signed)
Controlled, no change in medication Hyperlipidemia:Low fat diet discussed and encouraged.   Lipid Panel  Lab Results  Component Value Date   CHOL 157 06/11/2023   HDL 82 06/11/2023   LDLCALC 63 06/11/2023   TRIG 58 06/11/2023   CHOLHDL 1.9 06/11/2023

## 2023-06-16 NOTE — Assessment & Plan Note (Signed)
Being treated by Cardiology, encouraged to keep close follow up

## 2023-06-16 NOTE — Assessment & Plan Note (Signed)
Undercorrected, worsened , must verify med dose taking and also how he is taking as on empty stomach

## 2023-06-16 NOTE — Assessment & Plan Note (Signed)
Controlled, no change in medication DASH diet and commitment to daily physical activity for a minimum of 30 minutes discussed and encouraged, as a part of hypertension management. The importance of attaining a healthy weight is also discussed.     06/10/2023    2:09 PM 06/10/2023    2:00 PM 05/15/2023    1:45 PM 04/22/2023    1:14 PM 04/22/2023    1:13 PM 12/24/2022   11:49 AM 12/04/2022   11:34 AM  BP/Weight  Systolic BP 146 147 146 147 146 116 134  Diastolic BP 79 86 90 88 91 84 76  Wt. (Lbs)  141.04 140.2  137.04 138   BMI  22.09 kg/m2 21.96 kg/m2  21.46 kg/m2 21.61 kg/m2

## 2023-06-16 NOTE — Assessment & Plan Note (Signed)
Refer GI , also colonoscopy past due x 4 years

## 2023-06-16 NOTE — Assessment & Plan Note (Signed)
Followed closely by Cardiology

## 2023-06-19 ENCOUNTER — Ambulatory Visit (HOSPITAL_COMMUNITY)
Admission: RE | Admit: 2023-06-19 | Discharge: 2023-06-19 | Disposition: A | Discharge status: Home / Self Care | Payer: PPO | Source: Ambulatory Visit | Attending: Internal Medicine | Admitting: Internal Medicine

## 2023-06-19 DIAGNOSIS — I351 Nonrheumatic aortic (valve) insufficiency: Secondary | ICD-10-CM | POA: Insufficient documentation

## 2023-06-19 DIAGNOSIS — R7303 Prediabetes: Secondary | ICD-10-CM | POA: Insufficient documentation

## 2023-06-19 DIAGNOSIS — E785 Hyperlipidemia, unspecified: Secondary | ICD-10-CM | POA: Insufficient documentation

## 2023-06-19 DIAGNOSIS — I509 Heart failure, unspecified: Secondary | ICD-10-CM | POA: Insufficient documentation

## 2023-06-19 DIAGNOSIS — I110 Hypertensive heart disease with heart failure: Secondary | ICD-10-CM | POA: Diagnosis not present

## 2023-06-19 DIAGNOSIS — I428 Other cardiomyopathies: Secondary | ICD-10-CM | POA: Insufficient documentation

## 2023-06-19 DIAGNOSIS — I493 Ventricular premature depolarization: Secondary | ICD-10-CM | POA: Diagnosis not present

## 2023-06-19 LAB — ECHOCARDIOGRAM COMPLETE
AR max vel: 1.47 cm2
AV Area VTI: 1.8 cm2
AV Area mean vel: 1.6 cm2
AV Mean grad: 2.9 mmHg
AV Peak grad: 5.9 mmHg
Ao pk vel: 1.22 m/s
Area-P 1/2: 3.37 cm2
Calc EF: 50.7 %
S' Lateral: 3.8 cm
Single Plane A2C EF: 49.4 %
Single Plane A4C EF: 50.9 %

## 2023-06-19 MED ORDER — LEVOTHYROXINE SODIUM 100 MCG PO TABS: 100.0000 ug | ORAL_TABLET | Freq: Every day | ORAL | 1 refills | Status: DC

## 2023-06-19 NOTE — Addendum Note (Signed)
Addended by: Syliva Overman E on: 06/19/2023 08:00 AM   Modules accepted: Orders

## 2023-06-19 NOTE — Progress Notes (Signed)
*  PRELIMINARY RESULTS* Echocardiogram 2D Echocardiogram has been performed.  Stacey Drain 06/19/2023, 3:00 PM

## 2023-06-23 ENCOUNTER — Other Ambulatory Visit: Payer: Self-pay

## 2023-06-23 ENCOUNTER — Encounter (INDEPENDENT_AMBULATORY_CARE_PROVIDER_SITE_OTHER): Payer: Self-pay | Admitting: *Deleted

## 2023-06-23 DIAGNOSIS — E039 Hypothyroidism, unspecified: Secondary | ICD-10-CM

## 2023-06-27 ENCOUNTER — Telehealth: Payer: Self-pay | Admitting: *Deleted

## 2023-06-27 ENCOUNTER — Encounter: Payer: Self-pay | Admitting: *Deleted

## 2023-06-27 NOTE — Telephone Encounter (Signed)
Error

## 2023-07-03 ENCOUNTER — Other Ambulatory Visit: Payer: Self-pay | Admitting: Family Medicine

## 2023-07-15 ENCOUNTER — Encounter: Payer: Self-pay | Admitting: Internal Medicine

## 2023-07-15 ENCOUNTER — Ambulatory Visit: Payer: PPO | Attending: Internal Medicine | Admitting: Internal Medicine

## 2023-07-15 VITALS — BP 120/80 | HR 62 | Ht 67.0 in | Wt 139.0 lb

## 2023-07-15 DIAGNOSIS — I5032 Chronic diastolic (congestive) heart failure: Secondary | ICD-10-CM | POA: Insufficient documentation

## 2023-07-15 MED ORDER — DAPAGLIFLOZIN PROPANEDIOL 10 MG PO TABS: 10.0000 mg | ORAL_TABLET | Freq: Every day | ORAL | 0 refills | Status: DC

## 2023-07-15 NOTE — Patient Instructions (Addendum)

## 2023-07-15 NOTE — Progress Notes (Signed)
Cardiology Office Note  Date: 07/15/2023   ID: Vedh, Brito 1940-12-21, MRN [id number]  PCP:  Kerri Perches, MD  Cardiologist:  Little Ishikawa, MD Electrophysiologist:  Lewayne Bunting, MD    History of Present Illness: [name] is a 82 y.o. male known to have NICM LVEF 25 to 30% in 04/2022 [facility] showed angiographically normal coronaries/normal filling pressures), PVC burden 24%, HTN, HLD, hypothyroidism, prediabetes, prostate cancer presented to cardiology clinic for follow-up visit.  He was seen by EP, amiodarone was started with complete suppression of PVCs.  Repeat event monitor did not show any evidence of PVCs.  Repeat echocardiogram showed improvement in LVEF from 25 to 30% in 2023 to 40 to 45% in 2024.  Compliant with medications, feels sluggish after starting to take amiodarone 200 mg.  No symptoms, overall doing great, no angina, DOE, orthopnea, PND, dizziness, presyncope, syncope and palpitations.  He is physically active at baseline, walks around the house and is DIY projects with no symptoms.  He reported that he is feeling sick after taking all his medications.  Past Medical History:  Diagnosis Date   Allergic rhinitis, seasonal    BPH (benign prostatic hypertrophy)    Erectile dysfunction    History of kidney stones    Hx of thyroidectomy    Hyperlipidemia    Hypertension    Hypothyroidism    Pre-diabetes    Prostate cancer (HCC) 2011   treated with radiation   Urosepsis 07/2011   ICU admission [facility]    Past Surgical History:  Procedure Laterality Date   COLONOSCOPY N/A 09/03/2013   Procedure: COLONOSCOPY;  Surgeon: Malissa Hippo, MD;  [facility];  Service: Endoscopy;  Laterality: N/A;  1225   COLONOSCOPY N/A 11/08/2015   Procedure: COLONOSCOPY;  Surgeon: Malissa Hippo, MD;  Location: [facility];  Service: Endoscopy;  Laterality: N/A;  1200   MASS EXCISION N/A 08/15/2017   Procedure: EXCISION SEBACEOUS CYST  NECK;  Surgeon: Franky Macho, MD;  Location: [facility] ORS;  Service: General;  Laterality: N/A;   PROSTATE SURGERY     RIGHT/LEFT HEART CATH AND CORONARY ANGIOGRAPHY N/A 04/30/2022   Procedure: RIGHT/LEFT HEART CATH AND CORONARY ANGIOGRAPHY;  Surgeon: Swaziland, Peter M, MD;  Location: [facility] INVASIVE CV LAB;  Service: Cardiovascular;  Laterality: N/A;   THYROIDECTOMY  2003   transurethral resection of thr prostate  2003   ureteral stone extraction  08/2011    Current Outpatient Medications  Medication Sig Dispense Refill   amiodarone (PACERONE) 200 MG tablet Take 200 mg by mouth daily.     amLODipine (NORVASC) 10 MG tablet Take 1 tablet (10 mg total) by mouth daily. 90 tablet 0   aspirin EC 81 MG tablet Take 1 tablet (81 mg total) by mouth daily. 30 tablet 5   cetirizine (ZYRTEC) 10 MG tablet TAKE 1 TABLET EVERY DAY 90 tablet 10   EPINEPHrine 0.3 mg/0.3 mL IJ SOAJ injection Inject 0.3 mLs (0.3 mg total) into the muscle as needed for anaphylaxis. 1 each 2   levothyroxine (SYNTHROID) 100 MCG tablet Take 1 tablet (100 mcg total) by mouth daily. 90 tablet 1   losartan (COZAAR) 50 MG tablet TAKE 1 TABLET BY MOUTH ONCE DAILY. STOP TAKING ENTRESTO. 90 tablet 0   lovastatin (MEVACOR) 40 MG tablet Take 1 tablet (40 mg total) by mouth daily. 90 tablet 0   metoprolol succinate (TOPROL-XL) 50 MG 24 hr tablet TAKE ONE TABLET BY MOUTH ONCE DAILY  WITH MEAL. 90 tablet 0   Multiple Vitamin (MULTIVITAMIN WITH MINERALS) TABS tablet Take 1 tablet by mouth daily.     spironolactone (ALDACTONE) 25 MG tablet Take 0.5 tablets (12.5 mg total) by mouth daily. 45 tablet 3   tadalafil (CIALIS) 5 MG tablet TAKE ONE TABLET BY MOUTH ONCE DAILY. 30 tablet 11   tamsulosin (FLOMAX) 0.4 MG CAPS capsule Take 1 capsule (0.4 mg total) by mouth daily. 30 capsule 5   dapagliflozin propanediol (FARXIGA) 10 MG TABS tablet Take 1 tablet (10 mg total) by mouth daily before breakfast. 21 tablet 0   No current facility-administered medications for  this visit.   Allergies:  Other, Peanut-containing drug products, Egg-derived products, Iodine, Shellfish allergy, and Penicillins   Social History: The patient  reports that he has never smoked. He has never been exposed to tobacco smoke. He has never used smokeless tobacco. He reports that he does not drink alcohol and does not use drugs.   Family History: The patient's family history includes Diabetes in his brother, brother, and father; Gout in his brother; Hypertension in his father; Kidney disease in his brother; Stroke in his brother and mother.   ROS:  Please see the history of present illness. Otherwise, complete review of systems is positive for none.  All other systems are reviewed and negative.   Physical Exam: VS:  BP 120/80   Pulse 62   Ht 5\' 7"  (1.702 m)   Wt 139 lb (63 kg)   SpO2 95%   BMI 21.77 kg/m , BMI Body mass index is 21.77 kg/m.  Wt Readings from Last 3 Encounters:  07/15/23 139 lb (63 kg)  06/10/23 141 lb 0.6 oz (64 kg)  05/15/23 140 lb 3.2 oz (63.6 kg)    General: Patient appears comfortable at rest. HEENT: Conjunctiva and lids normal, oropharynx clear with moist mucosa. Neck: Supple, no elevated JVP or carotid bruits, no thyromegaly. Lungs: Clear to auscultation, nonlabored breathing at rest. Cardiac: Regular rate and rhythm, no S3 or significant systolic murmur, no pericardial rub. Abdomen: Soft, nontender, no hepatomegaly, bowel sounds present, no guarding or rebound. Extremities: No pitting edema, distal pulses 2+. Skin: Warm and dry. Musculoskeletal: No kyphosis. Neuropsychiatric: Alert and oriented x3, affect grossly appropriate.  ECG:  NSR and PVC on 10/25/22  Recent Labwork: 07/25/2022: Magnesium 2.3 04/22/2023: Hemoglobin 14.3; Platelets 184 06/11/2023: ALT 8; AST 14; BUN 21; Creatinine, Ser 1.59; Potassium 4.1; Sodium 147; TSH 10.200     Component Value Date/Time   CHOL 157 06/11/2023 1121   TRIG 58 06/11/2023 1121   HDL 82 06/11/2023  1121   CHOLHDL 1.9 06/11/2023 1121   CHOLHDL 2.2 04/03/2020 1122   VLDL 15 01/06/2017 1416   LDLCALC 63 06/11/2023 1121   LDLCALC 64 04/03/2020 1122    Assessment and Plan:  Patient is 82 year old M known to have NICM LVEF 25 to 30% in 04/2022 [facility] showed angiographically normal coronaries/normal filling pressures and 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  # HFimpEF / NICM (25-30% in 2023 improved to 40-45% in 2024) -[facility] in 04/2022 showed angiographically normal coronaries and normal filling pressures. He was seen by EP, started on amiodarone for suppression of PVCs.  Repeat event monitor showed complete suppression of PVCs. His  LVEF on repeat echo improved from 25 to 30% to 40 to 45%.  His cardiomyopathy is multifactorial, PVC induced and nonischemic. -Continue metoprolol succinate 50 mg  once daily -Continue losartan 50 mg once daily (was not able to afford Entresto) -Continue spironolactone 12.5 mg once daily Continue Farxiga 10 mg once daily (offered samples today, cost prohibitive)  # PVC burden 24%, resolved -PVCs completely suppressed with amiodarone, repeat event monitor showed no evidence of PVCs.  Continue metoprolol succinate 50 mg once daily and amiodarone 200 mg once daily.  Patient stated he is feeling sluggish after starting amiodarone 200 mg, and start him to reach out to Dr. Ladona Ridgel for further instructions.   Medication Changes: Meds ordered this encounter  Medications   dapagliflozin propanediol (FARXIGA) 10 MG TABS tablet    Sig: Take 1 tablet (10 mg total) by mouth daily before breakfast.    Dispense:  21 tablet    Refill:  0    Order Specific Question:   Lot Number?    Answer:   [id number]    Order Specific Question:   Expiration Date?    Answer:   12/11/2025    Order Specific Question:   Quantity    Answer:   21    Disposition:  Follow up  6  months  Signed, Macayla Ekdahl Verne Spurr, MD, 07/15/2023 12:43 PM    [facility] at [facility] [address] [place], [place] [address]

## 2023-07-16 ENCOUNTER — Other Ambulatory Visit: Payer: Self-pay | Admitting: *Deleted

## 2023-07-16 MED ORDER — METOPROLOL SUCCINATE ER 50 MG PO TB24: 50.0000 mg | ORAL_TABLET | Freq: Every day | ORAL | 1 refills | Status: DC

## 2023-07-21 ENCOUNTER — Ambulatory Visit: Payer: PPO | Admitting: Internal Medicine

## 2023-07-22 ENCOUNTER — Ambulatory Visit: Payer: PPO | Admitting: Internal Medicine

## 2023-07-24 ENCOUNTER — Other Ambulatory Visit: Payer: Self-pay | Admitting: Cardiology

## 2023-07-30 ENCOUNTER — Other Ambulatory Visit (HOSPITAL_BASED_OUTPATIENT_CLINIC_OR_DEPARTMENT_OTHER): Payer: Self-pay | Admitting: *Deleted

## 2023-07-30 ENCOUNTER — Ambulatory Visit (INDEPENDENT_AMBULATORY_CARE_PROVIDER_SITE_OTHER): Payer: PPO

## 2023-07-30 VITALS — Ht 67.0 in | Wt 138.0 lb

## 2023-07-30 DIAGNOSIS — Z1211 Encounter for screening for malignant neoplasm of colon: Secondary | ICD-10-CM

## 2023-07-30 DIAGNOSIS — Z0000 Encounter for general adult medical examination without abnormal findings: Secondary | ICD-10-CM | POA: Diagnosis not present

## 2023-07-30 MED ORDER — LOSARTAN POTASSIUM 50 MG PO TABS: 50.0000 mg | ORAL_TABLET | Freq: Every day | ORAL | 3 refills | Status: DC

## 2023-07-30 NOTE — Progress Notes (Signed)
 Because this visit was a virtual/telehealth visit,  certain criteria was not obtained, such a blood pressure, CBG if applicable, and timed get up and go. Any medications not marked as "taking" were not mentioned during the medication reconciliation part of the visit. Any vitals not documented were not able to be obtained due to this being a telehealth visit or patient was unable to self-report a recent blood pressure reading due to a lack of equipment at home via telehealth. Vitals that have been documented are verbally provided by the patient.   Subjective:   [name] is a 82 y.o. male who presents for Medicare Annual/Subsequent preventive examination.  Visit Complete: Virtual I connected with  Daphene Calamity on 07/30/23 by a audio enabled telemedicine application and verified that I am speaking with the correct person using two identifiers.  Patient Location: Home  Provider Location: Home Office  I discussed the limitations of evaluation and management by telemedicine. The patient expressed understanding and agreed to proceed.  Vital Signs: Because this visit was a virtual/telehealth visit, some criteria may be missing or patient reported. Any vitals not documented were not able to be obtained and vitals that have been documented are patient reported.  Patient Medicare AWV questionnaire was completed by the patient on na; I have confirmed that all information answered by patient is correct and no changes since this date.  Cardiac Risk Factors include: advanced age (>63men, >41 women);diabetes mellitus;dyslipidemia;hypertension;male gender;sedentary lifestyle     Objective:    Today's Vitals   07/30/23 1336 07/30/23 1337  Weight: 138 lb (62.6 kg)   Height: 5\' 7"  (1.702 m)   PainSc:  0-No pain   Body mass index is 21.61 kg/m.     07/30/2023    1:40 PM 11/11/2022    2:20 PM 06/24/2022    1:14 PM 04/30/2022    9:19 AM 06/13/2021    3:43 PM 02/16/2018   10:47 AM  08/12/2017    1:39 PM  Advanced Directives  Does Patient Have a Medical Advance Directive? No No No No No Yes No  Does patient want to make changes to medical advance directive?      Yes (MAU/Ambulatory/Procedural Areas - Information given)   Would patient like information on creating a medical advance directive? No - Patient declined Yes (MAU/Ambulatory/Procedural Areas - Information given) Yes (ED - Information included in AVS) No - Patient declined   No - Patient declined    Current Medications (verified) Outpatient Encounter Medications as of 07/30/2023  Medication Sig   amiodarone (PACERONE) 200 MG tablet Take 200 mg by mouth daily.   amLODipine (NORVASC) 10 MG tablet Take 1 tablet (10 mg total) by mouth daily.   aspirin EC 81 MG tablet Take 1 tablet (81 mg total) by mouth daily.   cetirizine (ZYRTEC) 10 MG tablet TAKE 1 TABLET EVERY DAY   dapagliflozin propanediol (FARXIGA) 10 MG TABS tablet Take 1 tablet (10 mg total) by mouth daily before breakfast.   EPINEPHrine 0.3 mg/0.3 mL IJ SOAJ injection Inject 0.3 mLs (0.3 mg total) into the muscle as needed for anaphylaxis.   levothyroxine (SYNTHROID) 100 MCG tablet Take 1 tablet (100 mcg total) by mouth daily.   losartan (COZAAR) 50 MG tablet Take 1 tablet (50 mg total) by mouth daily.   lovastatin (MEVACOR) 40 MG tablet Take 1 tablet (40 mg total) by mouth daily.   metoprolol succinate (TOPROL-XL) 50 MG 24 hr tablet Take 1 tablet (50 mg total) by mouth daily.  Multiple Vitamin (MULTIVITAMIN WITH MINERALS) TABS tablet Take 1 tablet by mouth daily.   spironolactone (ALDACTONE) 25 MG tablet TAKE (1/2) TABLET BY MOUTH ONCE DAILY.   tadalafil (CIALIS) 5 MG tablet TAKE ONE TABLET BY MOUTH ONCE DAILY.   tamsulosin (FLOMAX) 0.4 MG CAPS capsule Take 1 capsule (0.4 mg total) by mouth daily.   No facility-administered encounter medications on file as of 07/30/2023.    Allergies (verified) Other, Peanut-containing drug products, Egg-derived  products, Iodine, Shellfish allergy, and Penicillins   History: Past Medical History:  Diagnosis Date   Allergic rhinitis, seasonal    BPH (benign prostatic hypertrophy)    Erectile dysfunction    History of kidney stones    Hx of thyroidectomy    Hyperlipidemia    Hypertension    Hypothyroidism    Pre-diabetes    Prostate cancer (HCC) 2011   treated with radiation   Urosepsis 07/2011   ICU admission [facility]   Past Surgical History:  Procedure Laterality Date   COLONOSCOPY N/A 09/03/2013   Procedure: COLONOSCOPY;  Surgeon: Malissa Hippo, MD;  [facility];  Service: Endoscopy;  Laterality: N/A;  1225   COLONOSCOPY N/A 11/08/2015   Procedure: COLONOSCOPY;  Surgeon: Malissa Hippo, MD;  Location: [facility];  Service: Endoscopy;  Laterality: N/A;  1200   MASS EXCISION N/A 08/15/2017   Procedure: EXCISION SEBACEOUS CYST NECK;  Surgeon: Franky Macho, MD;  Location: [facility] ORS;  Service: General;  Laterality: N/A;   PROSTATE SURGERY     RIGHT/LEFT HEART CATH AND CORONARY ANGIOGRAPHY N/A 04/30/2022   Procedure: RIGHT/LEFT HEART CATH AND CORONARY ANGIOGRAPHY;  Surgeon: Swaziland, Peter M, MD;  Location: [facility] INVASIVE CV LAB;  Service: Cardiovascular;  Laterality: N/A;   THYROIDECTOMY  2003   transurethral resection of thr prostate  2003   ureteral stone extraction  08/2011   Family History  Problem Relation Age of Onset   Stroke Mother    Diabetes Father    Hypertension Father    Kidney disease Brother    Stroke Brother    Diabetes Brother    Gout Brother    Diabetes Brother    Social History   Socioeconomic History   Marital status: Married    Spouse name: Not on file   Number of children: 5   Years of education: Not on file   Highest education level: Not on file  Occupational History   Occupation: retired  Tobacco Use   Smoking status: Never    Passive exposure: Never   Smokeless tobacco: Never  Vaping Use   Vaping status: Never Used  Substance and Sexual  Activity   Alcohol use: No    Alcohol/week: 0.0 standard drinks of alcohol   Drug use: No   Sexual activity: Yes  Other Topics Concern   Not on file  Social History Narrative   Not on file   Social Determinants of Health   Financial Resource Strain: Low Risk  (07/30/2023)   Overall Financial Resource Strain (CARDIA)    Difficulty of Paying Living Expenses: Not very hard  Food Insecurity: No Food Insecurity (07/30/2023)   Hunger Vital Sign    Worried About Running Out of Food in the Last Year: Never true    Ran Out of Food in the Last Year: Never true  Transportation Needs: No Transportation Needs (07/30/2023)   PRAPARE - Administrator, Civil Service (Medical): No    Lack of Transportation (Non-Medical): No  Physical Activity:  Insufficiently Active (07/30/2023)   Exercise Vital Sign    Days of Exercise per Week: 3 days    Minutes of Exercise per Session: 20 min  Stress: No Stress Concern Present (07/30/2023)   [employer] of Occupational Health - Occupational Stress Questionnaire    Feeling of Stress : Not at all  Social Connections: Moderately Integrated (07/30/2023)   Social Connection and Isolation Panel [NHANES]    Frequency of Communication with Friends and Family: More than three times a week    Frequency of Social Gatherings with Friends and Family: More than three times a week    Attends Religious Services: More than 4 times per year    Active Member of [employer] or Organizations: No    Attends Engineer, structural: Never    Marital Status: Married    Tobacco Counseling Counseling given: Yes   Clinical Intake:  Pre-visit preparation completed: Yes  Pain : No/denies pain Pain Score: 0-No pain     BMI - recorded: 21.61 Nutritional Status: BMI of 19-24  Normal Nutritional Risks: None Diabetes: Yes CBG done?: No (telehealth visit.) Did pt. bring in CBG monitor from home?: No  How often do you need to have someone help you when you  read instructions, pamphlets, or other written materials from your doctor or pharmacy?: 1 - Never  Interpreter Needed?: No  Information entered by ::  Akeen Ledyard, CMA   Activities of Daily Living    07/30/2023    1:39 PM  In your present state of health, do you have any difficulty performing the following activities:  Hearing? 0  Vision? 0  Difficulty concentrating or making decisions? 0  Walking or climbing stairs? 0  Dressing or bathing? 0  Doing errands, shopping? 0  Preparing Food and eating ? N  Using the Toilet? N  In the past six months, have you accidently leaked urine? N  Do you have problems with loss of bowel control? N  Managing your Medications? N  Managing your Finances? N  Housekeeping or managing your Housekeeping? N    Patient Care Team: Kerri Perches, MD as PCP - General (Family Medicine) Little Ishikawa, MD as PCP - Cardiology (Cardiology) Marinus Maw, MD as PCP - Electrophysiology (Cardiology) Robyne Askew, MD as Attending Physician (Urology)  Indicate any recent Medical Services you may have received from other than [facility] providers in the past year (date may be approximate).     Assessment:   This is a routine wellness examination for [name].  Hearing/Vision screen Hearing Screening - Comments:: Patient denies any hearing difficulties.   Vision Screening - Comments:: Wears rx glasses - up to date with routine eye exams. Patient's eye doctor retired and he will be establishing with Dr. Bascom Levels soon.    Goals Addressed             This Visit's Progress    Patient Stated       Get some of my bills paid because they're very stressful       Depression Screen    07/30/2023    1:43 PM 06/10/2023    2:08 PM 04/22/2023    1:13 PM 11/11/2022    2:14 PM 10/11/2022    1:11 PM 04/03/2022    1:06 PM 06/13/2021    3:39 PM  PHQ 2/9 Scores  PHQ - 2 Score 0 0 0 0 0 0 0  PHQ- 9 Score 0 1 3        Fall Risk  07/30/2023     1:41 PM 06/10/2023    2:08 PM 04/22/2023    1:13 PM 11/11/2022    2:18 PM 10/11/2022    1:11 PM  Fall Risk   Falls in the past year? 0 0 0 0 0  Number falls in past yr: 0  0  0  Injury with Fall? 0  0  0  Risk for fall due to : No Fall Risks  No Fall Risks    Follow up Falls prevention discussed  Falls evaluation completed      MEDICARE RISK AT HOME: Medicare Risk at Home Any stairs in or around the home?: Yes If so, are there any without handrails?: Yes Home free of loose throw rugs in walkways, pet beds, electrical cords, etc?: No Adequate lighting in your home to reduce risk of falls?: No Life alert?: No Use of a cane, walker or w/c?: No Grab bars in the bathroom?: No Shower chair or bench in shower?: No Elevated toilet seat or a handicapped toilet?: No  TIMED UP AND GO:  Was the test performed?  No    Cognitive Function:        07/30/2023    1:43 PM 06/24/2022    1:32 PM 06/13/2021    3:44 PM 06/12/2020    3:33 PM 02/18/2019    8:31 AM  6CIT Screen  What Year? 0 points 0 points 0 points 0 points 0 points  What month? 0 points 0 points 0 points 0 points 0 points  What time? 0 points 0 points 0 points 0 points 0 points  Count back from 20 0 points 0 points 0 points 0 points 0 points  Months in reverse 0 points 0 points 2 points 0 points 0 points  Repeat phrase 0 points 0 points 0 points 4 points 0 points  Total Score 0 points 0 points 2 points 4 points 0 points    Immunizations Immunization History  Administered Date(s) Administered   Influenza Whole 09/28/2004   MODERNA COVID-19 SARS-COV-2 PEDS BIVALENT BOOSTER 28yr-36yr 10/02/2021   Moderna SARS-COV2 Booster Vaccination 08/02/2022   Moderna Sars-Covid-2 Vaccination 12/01/2019, 12/29/2019, 08/29/2020, 04/10/2021, 10/02/2021   Pneumococcal Conjugate-13 09/05/2014   Pneumococcal Polysaccharide-23 03/19/2004, 07/11/2009   Td 03/19/2004   Zoster Recombinant(Shingrix) 10/02/2021, 01/24/2022    TDAP status: Due,  Education has been provided regarding the importance of this vaccine. Advised may receive this vaccine at local pharmacy or Health Dept. Aware to provide a copy of the vaccination record if obtained from local pharmacy or Health Dept. Verbalized acceptance and understanding.  Flu Vaccine Status: Discontinued by primary care provider due to patient having an egg allergy  Pneumococcal vaccine status: Up to date  Covid-19 vaccine status: Information provided on how to obtain vaccines.   Qualifies for Shingles Vaccine? No   Zostavax completed no Shingrix Completed?: Yes  Screening Tests Health Maintenance  Topic Date Due   DTaP/Tdap/Td (2 - Tdap) 03/19/2014   Colonoscopy  11/07/2018   COVID-19 Vaccine (6 - 2023-24 season) 06/15/2023   Diabetic kidney evaluation - Urine ACR  10/12/2027 (Originally 03/22/2011)   HEMOGLOBIN A1C  10/23/2023   Medicare Annual Wellness (AWV)  06/09/2024   Diabetic kidney evaluation - eGFR measurement  06/10/2024   Pneumonia Vaccine 84+ Years old  Completed   Zoster Vaccines- Shingrix  Completed   HPV VACCINES  Aged Out   INFLUENZA VACCINE  Discontinued    Health Maintenance  Health Maintenance Due  Topic Date Due  DTaP/Tdap/Td (2 - Tdap) 03/19/2014   Colonoscopy  11/07/2018   COVID-19 Vaccine (6 - 2023-24 season) 06/15/2023    Colorectal cancer screening: Referral to GI placed 07/30/2023. Pt aware the office will call re: appt.  Lung Cancer Screening: (Low Dose CT Chest recommended if Age 65-80 years, 20 pack-year currently smoking OR have quit w/in 15years.) does not qualify.   Additional Screening:  Hepatitis C Screening: does not qualify Vision Screening: Recommended annual ophthalmology exams for early detection of glaucoma and other disorders of the eye. Is the patient up to date with their annual eye exam?  Yes  Who is the provider or what is the name of the office in which the patient attends annual eye exams? Dr. Bascom Levels will be patients  new eye doctor since his previous provider retired If pt is not established with a provider, would they like to be referred to a provider to establish care? No .   Dental Screening: Recommended annual dental exams for proper oral hygiene  Diabetic Foot Exam: na per primary care provider, patient is not a candidate for foot exams  Community Resource Referral / Chronic Care Management: CRR required this visit?  No   CCM required this visit?  No     Plan:     I have personally reviewed and noted the following in the patient's chart:   Medical and social history Use of alcohol, tobacco or illicit drugs  Current medications and supplements including opioid prescriptions. Patient is not currently taking opioid prescriptions. Functional ability and status Nutritional status Physical activity Advanced directives List of other physicians Hospitalizations, surgeries, and ER visits in previous 12 months Vitals Screenings to include cognitive, depression, and falls Referrals and appointments  In addition, I have reviewed and discussed with patient certain preventive protocols, quality metrics, and best practice recommendations. A written personalized care plan for preventive services as well as general preventive health recommendations were provided to patient.     Jordan Hawks Janace Decker, CMA   07/30/2023   After Visit Summary: (Mail) Due to this being a telephonic visit, the after visit summary with patients personalized plan was offered to patient via mail

## 2023-07-30 NOTE — Patient Instructions (Signed)
[name] , Thank you for taking time to come for your Medicare Wellness Visit. I appreciate your ongoing commitment to your health goals. Please review the following plan we discussed and let me know if I can assist you in the future.   Referrals/Orders/Follow-Ups/Clinician Recommendations:  Next Medicare Annual Wellness Visit: August 02, 2024 at 1:50pm virtual visit  You have been referred to [facility].If you haven't heard from them within the next week, please call them to schedule your appointment.    Address: [address] [place], [place] [address] Phone: [phone]   This is a list of the screening recommended for you and due dates:  Health Maintenance  Topic Date Due   DTaP/Tdap/Td vaccine (2 - Tdap) 03/19/2014   Colon Cancer Screening  11/07/2018   COVID-19 Vaccine (6 - 2023-24 season) 06/15/2023   Yearly kidney health urinalysis for diabetes  10/12/2027*   Hemoglobin A1C  10/23/2023   Yearly kidney function blood test for diabetes  06/10/2024   Medicare Annual Wellness Visit  07/29/2024   Pneumonia Vaccine  Completed   Zoster (Shingles) Vaccine  Completed   HPV Vaccine  Aged Out   Flu Shot  Discontinued  *Topic was postponed. The date shown is not the original due date.    Advanced directives: (Declined) Advance directive discussed with you today. Even though you declined this today, please call our office should you change your mind, and we can give you the proper paperwork for you to fill out.  Next Medicare Annual Wellness Visit scheduled for next year: Yes  Preventive Care 53 Years and Older, Male Preventive care refers to lifestyle choices and visits with your health care provider that can promote health and wellness. Preventive care visits are also called wellness exams. What can I expect for my preventive care visit? Counseling During your preventive care visit, your health care provider may ask about your: Medical history, including: Past  medical problems. Family medical history. History of falls. Current health, including: Emotional well-being. Home life and relationship well-being. Sexual activity. Memory and ability to understand (cognition). Lifestyle, including: Alcohol, nicotine or tobacco, and drug use. Access to firearms. Diet, exercise, and sleep habits. Work and work Astronomer. Sunscreen use. Safety issues such as seatbelt and bike helmet use. Physical exam Your health care provider will check your: Height and weight. These may be used to calculate your BMI (body mass index). BMI is a measurement that tells if you are at a healthy weight. Waist circumference. This measures the distance around your waistline. This measurement also tells if you are at a healthy weight and may help predict your risk of certain diseases, such as type 2 diabetes and high blood pressure. Heart rate and blood pressure. Body temperature. Skin for abnormal spots. What immunizations do I need?  Vaccines are usually given at various ages, according to a schedule. Your health care provider will recommend vaccines for you based on your age, medical history, and lifestyle or other factors, such as travel or where you work. What tests do I need? Screening Your health care provider may recommend screening tests for certain conditions. This may include: Lipid and cholesterol levels. Diabetes screening. This is done by checking your blood sugar (glucose) after you have not eaten for a while (fasting). Hepatitis C test. Hepatitis B test. HIV (human immunodeficiency virus) test. STI (sexually transmitted infection) testing, if you are at risk. Lung cancer screening. Colorectal cancer screening. Prostate cancer screening. Abdominal aortic aneurysm (AAA) screening. You may need  this if you are a current or former smoker. Talk with your health care provider about your test results, treatment options, and if necessary, the need for more  tests. Follow these instructions at home: Eating and drinking  Eat a diet that includes fresh fruits and vegetables, whole grains, lean protein, and low-fat dairy products. Limit your intake of foods with high amounts of sugar, saturated fats, and salt. Take vitamin and mineral supplements as recommended by your health care provider. Do not drink alcohol if your health care provider tells you not to drink. If you drink alcohol: Limit how much you have to 0-2 drinks a day. Know how much alcohol is in your drink. In the [place] one drink equals one 12 oz bottle of beer (355 mL), one 5 oz glass of wine (148 mL), or one 1 oz glass of hard liquor (44 mL). Lifestyle Brush your teeth every morning and night with fluoride toothpaste. Floss one time each day. Exercise for at least 30 minutes 5 or more days each week. Do not use any products that contain nicotine or tobacco. These products include cigarettes, chewing tobacco, and vaping devices, such as e-cigarettes. If you need help quitting, ask your health care provider. Do not use drugs. If you are sexually active, practice safe sex. Use a condom or other form of protection to prevent STIs. Take aspirin only as told by your health care provider. Make sure that you understand how much to take and what form to take. Work with your health care provider to find out whether it is safe and beneficial for you to take aspirin daily. Ask your health care provider if you need to take a cholesterol-lowering medicine (statin). Find healthy ways to manage stress, such as: Meditation, yoga, or listening to music. Journaling. Talking to a trusted person. Spending time with friends and family. Safety Always wear your seat belt while driving or riding in a vehicle. Do not drive: If you have been drinking alcohol. Do not ride with someone who has been drinking. When you are tired or distracted. While texting. If you have been using any mind-altering substances  or drugs. Wear a helmet and other protective equipment during sports activities. If you have firearms in your house, make sure you follow all gun safety procedures. Minimize exposure to UV radiation to reduce your risk of skin cancer. What's next? Visit your health care provider once a year for an annual wellness visit. Ask your health care provider how often you should have your eyes and teeth checked. Stay up to date on all vaccines. This information is not intended to replace advice given to you by your health care provider. Make sure you discuss any questions you have with your health care provider. Document Revised: 03/28/2021 Document Reviewed: 03/28/2021 Elsevier Patient Education  2024 [employer]. Understanding Your Risk for Falls Millions of people have serious injuries from falls each year. It is important to understand your risk of falling. Talk with your health care provider about your risk and what you can do to lower it. If you do have a serious fall, make sure to tell your provider. Falling once raises your risk of falling again. How can falls affect me? Serious injuries from falls are common. These include: Broken bones, such as hip fractures. Head injuries, such as traumatic brain injuries (TBI) or concussions. A fear of falling can cause you to avoid activities and stay at home. This can make your muscles weaker and raise your risk for  a fall. What can increase my risk? There are a number of risk factors that increase your risk for falling. The more risk factors you have, the higher your risk of falling. Serious injuries from a fall happen most often to people who are older than 84 years old. Teenagers and young adults ages 40-29 are also at higher risk. Common risk factors include: Weakness in the lower body. Being generally weak or confused due to long-term (chronic) illness. Dizziness or balance problems. Poor vision. Medicines that cause dizziness or drowsiness.  These may include: Medicines for your blood pressure, heart, anxiety, insomnia, or swelling (edema). Pain medicines. Muscle relaxants. Other risk factors include: Drinking alcohol. Having had a fall in the past. Having foot pain or wearing improper footwear. Working at a dangerous job. Having any of the following in your home: Tripping hazards, such as floor clutter or loose rugs. Poor lighting. Pets. Having dementia or memory loss. What actions can I take to lower my risk of falling?     Physical activity Stay physically fit. Do strength and balance exercises. Consider taking a regular class to build strength and balance. Yoga and tai chi are good options. Vision Have your eyes checked every year and your prescription for glasses or contacts updated as needed. Shoes and walking aids Wear non-skid shoes. Wear shoes that have rubber soles and low heels. Do not wear high heels. Do not walk around the house in socks or slippers. Use a cane or walker as told by your provider. Home safety Attach secure railings on both sides of your stairs. Install grab bars for your bathtub, shower, and toilet. Use a non-skid mat in your bathtub or shower. Attach bath mats securely with double-sided, non-slip rug tape. Use good lighting in all rooms. Keep a flashlight near your bed. Make sure there is a clear path from your bed to the bathroom. Use night-lights. Do not use throw rugs. Make sure all carpeting is taped or tacked down securely. Remove all clutter from walkways and stairways, including extension cords. Repair uneven or broken steps and floors. Avoid walking on icy or slippery surfaces. Walk on the grass instead of on icy or slick sidewalks. Use ice melter to get rid of ice on walkways in the winter. Use a cordless phone. Questions to ask your health care provider Can you help me check my risk for a fall? Do any of my medicines make me more likely to fall? Should I take a vitamin D  supplement? What exercises can I do to improve my strength and balance? Should I make an appointment to have my vision checked? Do I need a bone density test to check for weak bones (osteoporosis)? Would it help to use a cane or a walker? Where to find more information Centers for Disease Control and Prevention, STEADI: TonerPromos.no Community-Based Fall Prevention Programs: TonerPromos.no [employer] on Aging: BaseRingTones.pl Contact a health care provider if: You fall at home. You are afraid of falling at home. You feel weak, drowsy, or dizzy. This information is not intended to replace advice given to you by your health care provider. Make sure you discuss any questions you have with your health care provider. Document Revised: 06/03/2022 Document Reviewed: 06/03/2022 Elsevier Patient Education  2024 Elsevier Inc. Colorectal Cancer Screening  Colorectal cancer screening is a group of tests that are used to check for colorectal cancer before symptoms develop. Colorectal refers to the colon and rectum. The colon and rectum are located at the end of  the digestive tract and carry stool (feces) out of the body. Who should have screening? All adults who are 42-75 years old should have screening. Your health care provider may recommend screening before age 84. You will have tests every 1-10 years, depending on your results and the type of screening test. Screening recommendations for adults who are 43-80 years old vary depending on a person's health. People older than age 47 should no longer get colorectal cancer screening. You may have screening tests starting before age 64, or more often than other people, if you have any of these risk factors: A personal or family history of colorectal cancer or abnormal growths known as polyps in your colon. Inflammatory bowel disease, such as ulcerative colitis or Crohn's disease. A history of having radiation treatment to the abdomen or the area between the hip  bones (pelvic area) for cancer. A type of genetic syndrome that is passed from parent to child (hereditary), such as: Lynch syndrome. Familial adenomatous polyposis. Turcot syndrome. Peutz-Jeghers syndrome. MUTYH-associated polyposis (MAP). A personal history of diabetes. Types of tests There are several types of colorectal screening tests. You may have one or more of the following: Guaiac-based fecal occult blood testing. For this test, a stool sample is checked for hidden (occult) blood, which could be a sign of colorectal cancer. Fecal immunochemical test (FIT). For this test, a stool sample is checked for blood, which could be a sign of colorectal cancer. Stool DNA test. For this test, a stool sample is checked for blood and changes in DNA that could lead to colorectal cancer. Sigmoidoscopy. During this test, a thin, flexible tube with a camera on the end, called a sigmoidoscope, is used to examine the rectum and the lower colon. Colonoscopy. During this test, a long, flexible tube with a camera on the end, called a colonoscope, is used to examine the entire colon and rectum. Also, sometimes a tissue sample is taken to be looked at under a microscope (biopsy) or small polyps are removed during this test. Virtual colonoscopy. Instead of a colonoscope, this type of colonoscopy uses a CT scan to take pictures of the colon and rectum. A CT scan is a type of X-ray that is made using computers. What are the benefits of screening? Screening reduces your risk for colorectal cancer and can help identify cancer at an early stage, when the cancer can be removed or treated more easily. It is common for polyps to form in the lining of the colon, especially as you age. These polyps may be cancerous or become cancerous over time. Screening can identify these polyps. What are the risks of screening? Generally, these are safe tests. However, problems may occur, including: The need for more tests to confirm  results from a stool sample test. Stool sample tests have fewer risks than other types of screening tests. Being exposed to low levels of radiation, if you had a test involving X-rays. This may slightly increase your cancer risk. The benefit of detecting cancer outweighs the slight increase in risk. Bleeding, damage to the intestine, or infection caused by a sigmoidoscopy or colonoscopy. A reaction to medicines given during a sigmoidoscopy or colonoscopy. Talk with your health care provider to understand your risk for colorectal cancer and to make a screening plan that is right for you. Questions to ask your health care provider When should I start colorectal cancer screening? What is my risk for colorectal cancer? How often do I need screening? Which screening tests do I  need? How do I get my test results? What do my results mean? Where to find more information Learn more about colorectal cancer screening from: The American Cancer Society: cancer.org National Cancer Institute: cancer.gov Summary Colorectal cancer screening is a group of tests used to check for colorectal cancer before symptoms develop. All adults who are 83-33 years old should have screening. Your health care provider may recommend screening before age 66. You may have screening tests starting before age 84, or more often than other people, if you have certain risk factors. Screening reduces your risk for colorectal cancer and can help identify cancer at an early stage, when the cancer can be removed or treated more easily. Talk with your health care provider to understand your risk for colorectal cancer and to make a screening plan that is right for you. This information is not intended to replace advice given to you by your health care provider. Make sure you discuss any questions you have with your health care provider. Document Revised: 01/19/2020 Document Reviewed: 01/19/2020 Elsevier Patient Education  2024 [employer].

## 2023-08-05 ENCOUNTER — Encounter: Payer: Self-pay | Admitting: *Deleted

## 2023-08-13 ENCOUNTER — Other Ambulatory Visit: Payer: Self-pay | Admitting: Family Medicine

## 2023-08-15 ENCOUNTER — Ambulatory Visit: Payer: PPO | Attending: Internal Medicine | Admitting: Internal Medicine

## 2023-08-15 ENCOUNTER — Encounter: Payer: Self-pay | Admitting: Internal Medicine

## 2023-08-15 VITALS — BP 138/82 | HR 69 | Ht 67.0 in | Wt 142.2 lb

## 2023-08-15 DIAGNOSIS — I493 Ventricular premature depolarization: Secondary | ICD-10-CM

## 2023-08-15 MED ORDER — LOVASTATIN 20 MG PO TABS: 20.0000 mg | ORAL_TABLET | Freq: Every day | ORAL | 3 refills | Status: DC

## 2023-08-15 NOTE — Progress Notes (Signed)
HPI [name] returns for ongoing evaluation of PVC's. He has a h/o non-ischemic CM, and has class 1 symtoms on maximal medical therapy. The patient has not had syncope. He does not feel his palpitaitons. He had a 25% burden and I started him on amiodarone.  He has more than one morphology. He has been on a beta blocker. His EF was 25-30% in the setting of the PVC's. He was started on amio and repeat zio shows a burden of less than 1%. His EF improved to 40%. His main problem is difficulty with getting to sleep.  Allergies  Allergen Reactions   Other Other (See Comments) and Shortness Of Breath   Peanut-Containing Drug Products Shortness Of Breath and Swelling   Egg-Derived Products Swelling   Iodine Swelling   Shellfish Allergy Swelling   Penicillins Hives, Rash and Other (See Comments)    Has patient had a PCN reaction causing immediate rash, facial/tongue/throat swelling, SOB or lightheadedness with hypotension: No  Has patient had a PCN reaction causing severe rash involving mucus membranes or skin necrosis: Yes  Has patient had a PCN reaction that required hospitalization No  Has patient had a PCN reaction occurring within the last 10 years: No  If all of the above answers are "NO", then may proceed with Cephalosporin use.  Has patient had a PCN reaction causing immediate rash, facial/tongue/throat swelling, SOB or lightheadedness with hypotension: No, Has patient had a PCN reaction causing severe rash involving mucus membranes or skin necrosis: Yes, Has patient had a PCN reaction that required hospitalization No, Has patient had a PCN reaction occurring within the last 10 years: No, If all of the above answers are "NO", then may proceed with Cephalosporin use.     Current Outpatient Medications  Medication Sig Dispense Refill   amiodarone (PACERONE) 200 MG tablet Take 200 mg by mouth daily.     amLODipine (NORVASC) 10 MG tablet Take 1 tablet (10 mg total) by mouth daily.  90 tablet 0   aspirin EC 81 MG tablet Take 1 tablet (81 mg total) by mouth daily. 30 tablet 5   cetirizine (ZYRTEC) 10 MG tablet TAKE 1 TABLET EVERY DAY 90 tablet 10   dapagliflozin propanediol (FARXIGA) 10 MG TABS tablet Take 1 tablet (10 mg total) by mouth daily before breakfast. 21 tablet 0   EPINEPHrine 0.3 mg/0.3 mL IJ SOAJ injection Inject 0.3 mLs (0.3 mg total) into the muscle as needed for anaphylaxis. 1 each 2   levothyroxine (SYNTHROID) 100 MCG tablet Take 1 tablet (100 mcg total) by mouth daily. 90 tablet 1   losartan (COZAAR) 50 MG tablet Take 1 tablet (50 mg total) by mouth daily. 90 tablet 3   lovastatin (MEVACOR) 40 MG tablet Take 1 tablet (40 mg total) by mouth daily. 90 tablet 0   metoprolol succinate (TOPROL-XL) 50 MG 24 hr tablet Take 1 tablet (50 mg total) by mouth daily. 90 tablet 1   Multiple Vitamin (MULTIVITAMIN WITH MINERALS) TABS tablet Take 1 tablet by mouth daily.     spironolactone (ALDACTONE) 25 MG tablet TAKE (1/2) TABLET BY MOUTH ONCE DAILY. 45 tablet 3   tadalafil (CIALIS) 5 MG tablet TAKE ONE TABLET BY MOUTH ONCE DAILY. 30 tablet 11   tamsulosin (FLOMAX) 0.4 MG CAPS capsule TAKE ONE CAPSULE BY MOUTH ONCE DAILY. 30 capsule 5   No current facility-administered medications for this visit.     Past Medical History:  Diagnosis Date   Allergic rhinitis,  seasonal    BPH (benign prostatic hypertrophy)    Erectile dysfunction    History of kidney stones    Hx of thyroidectomy    Hyperlipidemia    Hypertension    Hypothyroidism    Pre-diabetes    Prostate cancer (HCC) 2011   treated with radiation   Urosepsis 07/2011   ICU admission [facility]    ROS:   All systems reviewed and negative except as noted in the HPI.   Past Surgical History:  Procedure Laterality Date   COLONOSCOPY N/A 09/03/2013   Procedure: COLONOSCOPY;  Surgeon: Malissa Hippo, MD;  Location: [facility];  Service: Endoscopy;  Laterality: N/A;  1225   COLONOSCOPY N/A 11/08/2015    Procedure: COLONOSCOPY;  Surgeon: Malissa Hippo, MD;  Location: [facility];  Service: Endoscopy;  Laterality: N/A;  1200   MASS EXCISION N/A 08/15/2017   Procedure: EXCISION SEBACEOUS CYST NECK;  Surgeon: Franky Macho, MD;  Location: [facility];  Service: General;  Laterality: N/A;   PROSTATE SURGERY     RIGHT/LEFT HEART CATH AND CORONARY ANGIOGRAPHY N/A 04/30/2022   Procedure: RIGHT/LEFT HEART CATH AND CORONARY ANGIOGRAPHY;  Surgeon: [place], Peter M, MD;  Location: [facility] INVASIVE CV LAB;  Service: Cardiovascular;  Laterality: N/A;   THYROIDECTOMY  2003   transurethral resection of thr prostate  2003   ureteral stone extraction  08/2011     Family History  Problem Relation Age of Onset   Stroke Mother    Diabetes Father    Hypertension Father    Kidney disease Brother    Stroke Brother    Diabetes Brother    Gout Brother    Diabetes Brother      Social History   Socioeconomic History   Marital status: Married    Spouse name: Not on file   Number of children: 5   Years of education: Not on file   Highest education level: Not on file  Occupational History   Occupation: retired  Tobacco Use   Smoking status: Never    Passive exposure: Never   Smokeless tobacco: Never  Vaping Use   Vaping status: Never Used  Substance and Sexual Activity   Alcohol use: No    Alcohol/week: 0.0 standard drinks of alcohol   Drug use: No   Sexual activity: Yes  Other Topics Concern   Not on file  Social History Narrative   Not on file   Social Determinants of Health   Financial Resource Strain: Low Risk  (07/30/2023)   Overall Financial Resource Strain (CARDIA)    Difficulty of Paying Living Expenses: Not very hard  Food Insecurity: No Food Insecurity (07/30/2023)   Hunger Vital Sign    Worried About Running Out of Food in the Last Year: Never true    Ran Out of Food in the Last Year: Never true  Transportation Needs: No Transportation Needs (07/30/2023)   PRAPARE - Therapist, art (Medical): No    Lack of Transportation (Non-Medical): No  Physical Activity: Insufficiently Active (07/30/2023)   Exercise Vital Sign    Days of Exercise per Week: 3 days    Minutes of Exercise per Session: 20 min  Stress: No Stress Concern Present (07/30/2023)   [employer] of Occupational Health - Occupational Stress Questionnaire    Feeling of Stress : Not at all  Social Connections: Moderately Integrated (07/30/2023)   Social Connection and Isolation Panel [NHANES]    Frequency of Communication with  Friends and Family: More than three times a week    Frequency of Social Gatherings with Friends and Family: More than three times a week    Attends Religious Services: More than 4 times per year    Active Member of [employer] or Organizations: No    Attends Banker Meetings: Never    Marital Status: Married  Catering manager Violence: Not At Risk (07/30/2023)   Humiliation, Afraid, Rape, and Kick questionnaire    Fear of Current or Ex-Partner: No    Emotionally Abused: No    Physically Abused: No    Sexually Abused: No     BP (!) 152/90 (BP Location: Right Arm, Patient Position: Sitting, Cuff Size: Normal)   Pulse 69   Ht 5\' 7"  (1.702 m)   Wt 142 lb 3.2 oz (64.5 kg)   SpO2 99%   BMI 22.27 kg/m   Physical Exam:  Well appearing NAD HEENT: Unremarkable Neck:  No JVD, no thyromegally Lymphatics:  No adenopathy Back:  No CVA tenderness Lungs:  Clear with no wheezes HEART:  Regular rate rhythm, no murmurs, no rubs, no clicks Abd:  soft, positive bowel sounds, no organomegally, no rebound, no guarding Ext:  2 plus pulses, no edema, no cyanosis, no clubbing Skin:  No rashes no nodules Neuro:  CN II through XII intact, motor grossly intact   Assess/Plan: non-ischemic CM - His EF has gone from 30% to 40% with reduction of the PVC burden. He will continue GDMT. PVC's -on amio, his PVC burden has gone down to less than 1%.  His EF has  improved. Continue amiodarone. Dyslipidemia - I asked him to reduce his dose of lovastatin to 20 mg daily. Paul Gowda Victorina Kable,MD

## 2023-08-15 NOTE — Patient Instructions (Signed)
Medication Instructions:  Your physician has recommended you make the following change in your medication:   Decrease Lovastatin to 20 mg Daily   *If you need a refill on your cardiac medications before your next appointment, please call your pharmacy*   Lab Work: NONE   If you have labs (blood work) drawn today and your tests are completely normal, you will receive your results only by: MyChart Message (if you have MyChart) OR A paper copy in the mail If you have any lab test that is abnormal or we need to change your treatment, we will call you to review the results.   Testing/Procedures: NONE    Follow-Up: At [facility], you and your health needs are our priority.  As part of our continuing mission to provide you with exceptional heart care, we have created designated Provider Care Teams.  These Care Teams include your primary Cardiologist (physician) and Advanced Practice Providers (APPs -  Physician Assistants and Nurse Practitioners) who all work together to provide you with the care you need, when you need it.  We recommend signing up for the patient portal called "MyChart".  Sign up information is provided on this After Visit Summary.  MyChart is used to connect with patients for Virtual Visits (Telemedicine).  Patients are able to view lab/test results, encounter notes, upcoming appointments, etc.  Non-urgent messages can be sent to your provider as well.   To learn more about what you can do with MyChart, go to [email or web address].    Your next appointment:   6 month(s)  Provider:   Lewayne Bunting, MD    Other Instructions Thank you for choosing [facility]!

## 2023-08-25 DIAGNOSIS — H40052 Ocular hypertension, left eye: Secondary | ICD-10-CM | POA: Diagnosis not present

## 2023-08-25 DIAGNOSIS — H524 Presbyopia: Secondary | ICD-10-CM | POA: Diagnosis not present

## 2023-08-25 DIAGNOSIS — H35363 Drusen (degenerative) of macula, bilateral: Secondary | ICD-10-CM | POA: Diagnosis not present

## 2023-08-25 DIAGNOSIS — H18893 Other specified disorders of cornea, bilateral: Secondary | ICD-10-CM | POA: Diagnosis not present

## 2023-08-25 DIAGNOSIS — H25813 Combined forms of age-related cataract, bilateral: Secondary | ICD-10-CM | POA: Diagnosis not present

## 2023-08-25 LAB — HM DIABETES EYE EXAM

## 2023-09-24 ENCOUNTER — Ambulatory Visit (INDEPENDENT_AMBULATORY_CARE_PROVIDER_SITE_OTHER): Payer: PPO

## 2023-09-24 DIAGNOSIS — Z23 Encounter for immunization: Secondary | ICD-10-CM

## 2023-10-07 ENCOUNTER — Other Ambulatory Visit: Payer: Self-pay | Admitting: Internal Medicine

## 2023-10-20 ENCOUNTER — Other Ambulatory Visit: Payer: Self-pay

## 2023-10-20 MED ORDER — LEVOTHYROXINE SODIUM 100 MCG PO TABS: 100.0000 ug | ORAL_TABLET | Freq: Every day | ORAL | 1 refills | Status: DC

## 2023-10-23 ENCOUNTER — Encounter: Payer: Self-pay | Admitting: Family Medicine

## 2023-10-27 ENCOUNTER — Other Ambulatory Visit: Payer: Self-pay | Admitting: Family Medicine

## 2023-11-21 ENCOUNTER — Other Ambulatory Visit: Payer: Self-pay | Admitting: Family Medicine

## 2023-11-21 DIAGNOSIS — I10 Essential (primary) hypertension: Secondary | ICD-10-CM

## 2024-01-04 ENCOUNTER — Other Ambulatory Visit: Payer: Self-pay | Admitting: Internal Medicine

## 2024-01-07 DIAGNOSIS — E039 Hypothyroidism, unspecified: Secondary | ICD-10-CM | POA: Diagnosis not present

## 2024-01-08 LAB — BMP8+EGFR
BUN/Creatinine Ratio: 11 (ref 10–24)
BUN: 16 mg/dL (ref 8–27)
CO2: 24 mmol/L (ref 20–29)
Calcium: 8.9 mg/dL (ref 8.6–10.2)
Chloride: 103 mmol/L (ref 96–106)
Creatinine, Ser: 1.46 mg/dL — ABNORMAL HIGH (ref 0.76–1.27)
Glucose: 100 mg/dL — ABNORMAL HIGH (ref 70–99)
Potassium: 4.1 mmol/L (ref 3.5–5.2)
Sodium: 142 mmol/L (ref 134–144)
eGFR: 47 mL/min/{1.73_m2} — ABNORMAL LOW (ref >59)

## 2024-01-08 LAB — TSH: TSH: 3.1 u[IU]/mL (ref 0.450–4.500)

## 2024-01-08 LAB — T4, FREE: Free T4: 1.69 ng/dL (ref 0.82–1.77)

## 2024-01-08 LAB — T3, FREE: T3, Free: 1.3 pg/mL — ABNORMAL LOW (ref 2.0–4.4)

## 2024-01-13 ENCOUNTER — Ambulatory Visit (INDEPENDENT_AMBULATORY_CARE_PROVIDER_SITE_OTHER): Admitting: Family Medicine

## 2024-01-13 VITALS — BP 138/80 | HR 70 | Resp 16 | Ht 67.0 in | Wt 145.1 lb

## 2024-01-13 DIAGNOSIS — D126 Benign neoplasm of colon, unspecified: Secondary | ICD-10-CM | POA: Diagnosis not present

## 2024-01-13 DIAGNOSIS — E039 Hypothyroidism, unspecified: Secondary | ICD-10-CM

## 2024-01-13 DIAGNOSIS — R634 Abnormal weight loss: Secondary | ICD-10-CM

## 2024-01-13 DIAGNOSIS — E785 Hyperlipidemia, unspecified: Secondary | ICD-10-CM | POA: Diagnosis not present

## 2024-01-13 DIAGNOSIS — I5022 Chronic systolic (congestive) heart failure: Secondary | ICD-10-CM

## 2024-01-13 DIAGNOSIS — I10 Essential (primary) hypertension: Secondary | ICD-10-CM | POA: Diagnosis not present

## 2024-01-13 MED ORDER — DAPAGLIFLOZIN PROPANEDIOL 10 MG PO TABS: 10.0000 mg | ORAL_TABLET | Freq: Every day | ORAL | Status: DC

## 2024-01-13 MED ORDER — METOPROLOL SUCCINATE ER 50 MG PO TB24: 50.0000 mg | ORAL_TABLET | Freq: Every day | ORAL | 1 refills | Status: DC

## 2024-01-13 MED ORDER — TAMSULOSIN HCL 0.4 MG PO CAPS: 0.4000 mg | ORAL_CAPSULE | Freq: Every day | ORAL | 5 refills | Status: DC

## 2024-01-13 MED ORDER — LEVOTHYROXINE SODIUM 75 MCG PO TABS: 75.0000 ug | ORAL_TABLET | Freq: Every day | ORAL | 3 refills | Status: DC

## 2024-01-13 NOTE — Patient Instructions (Signed)
 Annual exam in 3 to 4 months, call if you need me sooner  No medication changes  I will look into colonoscopy and farxiga and will be in touch if any updates  Grits and oatmeal double portion every morning  Thanks for choosing [facility], we consider it a privelige to serve you.

## 2024-01-16 ENCOUNTER — Ambulatory Visit: Payer: PPO | Admitting: Internal Medicine

## 2024-01-19 ENCOUNTER — Encounter: Payer: Self-pay | Admitting: Family Medicine

## 2024-01-19 NOTE — Assessment & Plan Note (Signed)
 On review, no change in q= weight x 12 months which is reassuring

## 2024-01-19 NOTE — Assessment & Plan Note (Signed)
No s/s of decompensation, clinically stable

## 2024-01-19 NOTE — Assessment & Plan Note (Signed)
 Controlled, no change in medication DASH diet and commitment to daily physical activity for a minimum of 30 minutes discussed and encouraged, as a part of hypertension management. The importance of attaining a healthy weight is also discussed.     01/13/2024   11:40 AM 01/13/2024   11:03 AM 08/15/2023    2:28 PM 08/15/2023    1:44 PM 07/30/2023    1:36 PM 07/15/2023   10:32 AM 06/10/2023    2:09 PM  BP/Weight  Systolic BP 140 151 138 152 -- 120 146  Diastolic BP 80 86 82 90 -- 80 79  Wt. (Lbs)  145.12  142.2 138 139   BMI  22.73 kg/m2  22.27 kg/m2 21.61 kg/m2 21.77 kg/m2

## 2024-01-19 NOTE — Progress Notes (Signed)
 [name]     MRN: [id number]      DOB: 17-Mar-1941  Chief Complaint  Patient presents with   Weight Loss    Wants to discuss recent unintentional weight loss and low energy    Hypothyroidism    Follow up     HPI [name] is here for follow up and re-evaluation of chronic medical conditions, medication management and review of any available recent lab and radiology data.  Preventive health is updated, specifically  Cancer screening and Immunization.   Questions or concerns regarding consultations or procedures which the PT has had in the interim are  addressed. The PT denies any adverse reactions to current medications since the last visit.  Reports unintentional weight loss which concerns him, recently had extensive extraction done which hinders ability to chew Denies change  in bM Still trying  to get  colonscopy  ROS Denies recent fever or chills. Denies sinus pressure, nasal congestion, ear pain or sore throat. Denies chest congestion, productive cough or wheezing. Denies chest pains, palpitations and leg swelling Denies abdominal pain, nausea, vomiting,diarrhea or constipation.   Denies dysuria, frequency, hesitancy or incontinence. Chronic back paoin  and limitation in mobility. Denies headaches, seizures, numbness, or tingling. Denies depression, anxiety or insomnia. Denies skin break down or rash.   PE BP 138/80   Pulse 70   Resp 16   Ht 5\' 7"  (1.702 m)   Wt 145 lb 1.9 oz (65.8 kg)   SpO2 98%   BMI 22.73 kg/m    Patient alert and oriented and in no cardiopulmonary distress.  HEENT: No facial asymmetry, EOMI,     Neck supple .  Chest: Clear to auscultation bilaterally.  CVS: S1, S2 no murmurs, no S3.Regular rate.  ABD: Soft non tender.   Ext: No edema  MS: decreased ROM spine, shoulders, hips and knees.  Skin: Intact, no ulcerations or rash noted.  Psych: Good eye contact, normal affect. Memory intact not anxious or depressed  appearing.  CNS: CN 2-12 intact, power,  normal throughout.no focal deficits noted.   Assessment & Plan  Unintentional weight loss On review, no change in q= weight x 12 months which is reassuring  Tubular adenoma of colon 10 years past rept colonoscopy, will continue to work on this  Hypothyroidism Controlled, no change in medication   Hyperlipidemia LDL goal <100 Hyperlipidemia:Low fat diet discussed and encouraged.   Lipid Panel  Lab Results  Component Value Date   CHOL 157 06/11/2023   HDL 82 06/11/2023   LDLCALC 63 06/11/2023   TRIG 58 06/11/2023   CHOLHDL 1.9 06/11/2023     Updated lab needed at/ before next visit.   Essential hypertension Controlled, no change in medication DASH diet and commitment to daily physical activity for a minimum of 30 minutes discussed and encouraged, as a part of hypertension management. The importance of attaining a healthy weight is also discussed.     01/13/2024   11:40 AM 01/13/2024   11:03 AM 08/15/2023    2:28 PM 08/15/2023    1:44 PM 07/30/2023    1:36 PM 07/15/2023   10:32 AM 06/10/2023    2:09 PM  BP/Weight  Systolic BP 140 151 138 152 -- 120 146  Diastolic BP 80 86 82 90 -- 80 79  Wt. (Lbs)  145.12  142.2 138 139   BMI  22.73 kg/m2  22.27 kg/m2 21.61 kg/m2 21.77 kg/m2        Chronic systolic CHF (  congestive heart failure) (HCC) No s/s of decompensation, clinically stab;le

## 2024-01-19 NOTE — Assessment & Plan Note (Signed)
 Hyperlipidemia:Low fat diet discussed and encouraged.   Lipid Panel  Lab Results  Component Value Date   CHOL 157 06/11/2023   HDL 82 06/11/2023   LDLCALC 63 06/11/2023   TRIG 58 06/11/2023   CHOLHDL 1.9 06/11/2023     Updated lab needed at/ before next visit.

## 2024-01-19 NOTE — Assessment & Plan Note (Signed)
 10 years past rept colonoscopy, will continue to work on this

## 2024-01-19 NOTE — Assessment & Plan Note (Signed)
 Controlled, no change in medication

## 2024-02-03 ENCOUNTER — Encounter: Payer: Self-pay | Admitting: *Deleted

## 2024-02-06 ENCOUNTER — Encounter: Payer: Self-pay | Admitting: Student

## 2024-02-06 ENCOUNTER — Ambulatory Visit: Payer: PPO | Attending: Student | Admitting: Student

## 2024-02-06 VITALS — BP 136/76 | HR 70 | Ht 67.0 in | Wt 146.2 lb

## 2024-02-06 DIAGNOSIS — I10 Essential (primary) hypertension: Secondary | ICD-10-CM

## 2024-02-06 DIAGNOSIS — I493 Ventricular premature depolarization: Secondary | ICD-10-CM

## 2024-02-06 DIAGNOSIS — E039 Hypothyroidism, unspecified: Secondary | ICD-10-CM | POA: Diagnosis not present

## 2024-02-06 DIAGNOSIS — E785 Hyperlipidemia, unspecified: Secondary | ICD-10-CM

## 2024-02-06 DIAGNOSIS — I5032 Chronic diastolic (congestive) heart failure: Secondary | ICD-10-CM | POA: Diagnosis not present

## 2024-02-06 DIAGNOSIS — Z79899 Other long term (current) drug therapy: Secondary | ICD-10-CM

## 2024-02-06 MED ORDER — AMIODARONE HCL 200 MG PO TABS: ORAL_TABLET | ORAL | 3 refills | Status: AC

## 2024-02-06 NOTE — Patient Instructions (Signed)
 Medication Instructions:  Your physician has recommended you make the following change in your medication:   -Take Amiodarone  200 mg once daily, NONE on Sundays.   *If you need a refill on your cardiac medications before your next appointment, please call your pharmacy*  Lab Work: [facility]- [employer]  If you have labs (blood work) drawn today and your tests are completely normal, you will receive your results only by: MyChart Message (if you have MyChart) OR A paper copy in the mail If you have any lab test that is abnormal or we need to change your treatment, we will call you to review the results.  Testing/Procedures: None  Follow-Up: At [facility], you and your health needs are our priority.  As part of our continuing mission to provide you with exceptional heart care, our providers are all part of one team.  This team includes your primary Cardiologist (physician) and Advanced Practice Providers or APPs (Physician Assistants and Nurse Practitioners) who all work together to provide you with the care you need, when you need it.  Your next appointment:   6 month(s)  Provider:   You may see Vishnu P Mallipeddi, MD or one of the following Advanced Practice Providers on your designated Care Team:   [place], PA-C  [place], [place] Theotis Flake, [place]     We recommend signing up for the patient portal called "MyChart".  Sign up information is provided on this After Visit Summary.  MyChart is used to connect with patients for Virtual Visits (Telemedicine).  Patients are able to view lab/test results, encounter notes, upcoming appointments, etc.  Non-urgent messages can be sent to your provider as well.   To learn more about what you can do with MyChart, go to [email or web address].   Other Instructions Follow up with Dr. Carolynne Citron in 3 months.

## 2024-02-06 NOTE — Progress Notes (Signed)
 Cardiology Office Note    Date:  02/06/2024  ID:  Cooper, Paul [birth year], MRN [id number] Cardiologist: Lasalle Pointer, MD   EP: Dr. Carolynne Citron  History of Present Illness:    [name] is a 83 y.o. male with past medical history of HFimpEF (EF 25-30% by echo in 04/2022 with cath showing normal cors, EF at 30-35% in 10/2022 and 40-45% in 06/2023 and cardiomyopathy felt to be due to PVC's), PVC's (24% burden by monitor in 04/2022, 6.8% PVC burden in 10/2022 and < 1% by monitor in 05/2023), HTN, HLD and Hypothyroidism who presents to the office today for 59-month follow-up.  He was last examined by Dr. Mallipeddi in 07/2023 and was overall remaining active at baseline and denied any recent anginal symptoms. He had been continued on Amiodarone  200 mg daily for his PVC's and was continued on Toprol -XL 50mg  daily, Losartan  50mg  daily, Spironolactone  12.5mg  daily and Farxiga  10mg  daily for his cardiomyopathy. He had previously been unable to afford Entresto . He did follow-up with Dr. Carolynne Citron in 08/2023 and he was encouraged to continue on Amiodarone  200mg  once daily but prior result note in 06/2023 said to take daily except for no Amiodarone  on Sundays.   In talking with the patient and his wife today, he reports overall doing well since his last office visit. His wife is concerned that he "overdoes it" at times. He enjoys working outside and doing yard work. He denies any recent chest pain or dyspnea on exertion with routine activities. No specific orthopnea, PND or pitting edema. Reports he has always been asymptomatic with his PVC's and no palpitations or presyncope.   Studies Reviewed:   EKG: EKG is not ordered today.  Event Monitor: 05/2023 NSR with sinus brady (45/min) with ave HR of 63/min and max of 93/min Rare PAC's and PVC's No sustained VT or SVT No prolonged pauses.   Paul Taylor,MD   Patch Wear Time:  3 days and 8 hours (2024-08-01T14:16:09-399 to  2024-08-04T22:17:59-0400)   Patient had a min HR of 45 bpm, max HR of 93 bpm, and avg HR of 63 bpm. Predominant underlying rhythm was Sinus Rhythm. Isolated SVEs were rare (<1.0%), and no SVE Couplets or SVE Triplets were present. Isolated VEs were rare (<1.0%), VE Couplets were  rare (<1.0%), and no VE Triplets were present. Ventricular Trigeminy was present.   Echocardiogram: 06/2023 IMPRESSIONS     1. Left ventricular ejection fraction, by estimation, is 40 to 45%. The  left ventricle has mildly decreased function. The left ventricle  demonstrates global hypokinesis. Left ventricular diastolic parameters are  consistent with Grade I diastolic  dysfunction (impaired relaxation). The average left ventricular global  longitudinal strain is -16.2 %.   2. Right ventricular systolic function is normal. The right ventricular  size is mildly enlarged. There is normal pulmonary artery systolic  pressure.   3. Left atrial size was mildly dilated.   4. Right atrial size was mildly dilated.   5. The mitral valve is normal in structure. No evidence of mitral valve  regurgitation. No evidence of mitral stenosis.   6. The tricuspid valve is abnormal.   7. The aortic valve is tricuspid. Aortic valve regurgitation is mild.   8. The pulmonic valve was abnormal.   9. The inferior vena cava is normal in size with greater than 50%  respiratory variability, suggesting right atrial pressure of 3 mmHg.    Physical Exam:   VS:  BP 136/76   Pulse  70   Ht 5\' 7"  (1.702 m)   Wt 146 lb 3.2 oz (66.3 kg)   SpO2 99%   BMI 22.90 kg/m    Wt Readings from Last 3 Encounters:  02/06/24 146 lb 3.2 oz (66.3 kg)  01/13/24 145 lb 1.9 oz (65.8 kg)  08/15/23 142 lb 3.2 oz (64.5 kg)     GEN: Pleasant, elderly male appearing in no acute distress NECK: No JVD; No carotid bruits CARDIAC: RRR, no murmurs, rubs, gallops RESPIRATORY:  Clear to auscultation without rales, wheezing or rhonchi  ABDOMEN: Appears  non-distended. No obvious abdominal masses. EXTREMITIES: No clubbing or cyanosis. No pitting edema.  Distal pedal pulses are 2+ bilaterally.   Assessment and Plan:   1. Chronic HFimpEF - His EF was previously 25-30% in 2023 with cath showing normal cors and had improved to 40-45% in 06/2023.  - He denies any respiratory issues and appears euvolemic by examination today. Continue current medical therapy with Farxiga  10 mg daily, Losartan  50 mg daily and Toprol -XL 50 mg daily.  Unclear if taking Spironolactone  12.5mg  daily as not on his current medication list and he is unsure of what he currently takes. Would resume in the future if needed for BP control or worsening edema. He was previously unable to afford Entresto . Would anticipate a follow-up echocardiogram later this year or early next year for reassessment of his EF.  2. PVC's - Prior monitor in 2023 showed a 24% PVC burden but this was less than 1% by repeat monitor in 05/2023. He is overall asymptomatic and denies any recent palpitations. - His current Rx says to take Amiodarone  200 mg twice daily but he confirms he is only taking it once daily. Was encouraged to continue on Amiodarone  200 mg once daily and do not take on Sundays as previously recommended by Dr. Carolynne Citron (per echo result note from 06/2023). TSH was normal at 3.10 when checked in 12/2023. Will request LFT's with his next set of labs and paperwork for this was provided today.  3. HTN - BP is well-controlled at 136/76 during today's visit. Continue current medical therapy with Amlodipine  10 mg daily, Losartan  50 mg daily and Toprol -XL 50 mg daily.  4. HLD - FLP in 05/2023 showed total cholesterol 157, triglycerides 58, HDL 82 and LDL 63. Previously on Lovastatin  and it appears this was discontinued by his PCP in the interim. This is reasonable given his age and no significant CAD by prior catheterization in 2023.  5. Hypothyroidism - Followed by PCP. TSH was at 3.10 when  checked in 12/2023. Remains on Synthroid  75 mcg daily.   Signed, Dorma Gash, PA-C

## 2024-02-16 ENCOUNTER — Ambulatory Visit: Payer: PPO | Admitting: Internal Medicine

## 2024-02-27 DIAGNOSIS — Z79899 Other long term (current) drug therapy: Secondary | ICD-10-CM | POA: Diagnosis not present

## 2024-02-28 ENCOUNTER — Ambulatory Visit: Payer: Self-pay | Admitting: Student

## 2024-02-28 LAB — COMPREHENSIVE METABOLIC PANEL WITH GFR
ALT: 10 IU/L (ref 0–44)
AST: 15 IU/L (ref 0–40)
Albumin: 4 g/dL (ref 3.7–4.7)
Alkaline Phosphatase: 71 IU/L (ref 44–121)
BUN/Creatinine Ratio: 11 (ref 10–24)
BUN: 17 mg/dL (ref 8–27)
Bilirubin Total: 1 mg/dL (ref 0.0–1.2)
CO2: 24 mmol/L (ref 20–29)
Calcium: 8.8 mg/dL (ref 8.6–10.2)
Chloride: 106 mmol/L (ref 96–106)
Creatinine, Ser: 1.58 mg/dL — ABNORMAL HIGH (ref 0.76–1.27)
Globulin, Total: 2.6 g/dL (ref 1.5–4.5)
Glucose: 85 mg/dL (ref 70–99)
Potassium: 4.3 mmol/L (ref 3.5–5.2)
Sodium: 144 mmol/L (ref 134–144)
Total Protein: 6.6 g/dL (ref 6.0–8.5)
eGFR: 43 mL/min/{1.73_m2} — ABNORMAL LOW (ref >59)

## 2024-03-03 ENCOUNTER — Ambulatory Visit: Payer: Self-pay | Admitting: Family Medicine

## 2024-04-13 ENCOUNTER — Ambulatory Visit: Admitting: Family Medicine

## 2024-04-13 ENCOUNTER — Encounter: Payer: Self-pay | Admitting: Family Medicine

## 2024-04-13 VITALS — BP 138/70 | HR 67 | Resp 16 | Ht 67.0 in | Wt 142.1 lb

## 2024-04-13 DIAGNOSIS — D649 Anemia, unspecified: Secondary | ICD-10-CM | POA: Diagnosis not present

## 2024-04-13 DIAGNOSIS — R634 Abnormal weight loss: Secondary | ICD-10-CM

## 2024-04-13 DIAGNOSIS — E039 Hypothyroidism, unspecified: Secondary | ICD-10-CM | POA: Diagnosis not present

## 2024-04-13 DIAGNOSIS — E1169 Type 2 diabetes mellitus with other specified complication: Secondary | ICD-10-CM

## 2024-04-13 DIAGNOSIS — F409 Phobic anxiety disorder, unspecified: Secondary | ICD-10-CM

## 2024-04-13 DIAGNOSIS — F5105 Insomnia due to other mental disorder: Secondary | ICD-10-CM

## 2024-04-13 DIAGNOSIS — N39498 Other specified urinary incontinence: Secondary | ICD-10-CM | POA: Diagnosis not present

## 2024-04-13 DIAGNOSIS — I5022 Chronic systolic (congestive) heart failure: Secondary | ICD-10-CM

## 2024-04-13 DIAGNOSIS — E785 Hyperlipidemia, unspecified: Secondary | ICD-10-CM

## 2024-04-13 DIAGNOSIS — D126 Benign neoplasm of colon, unspecified: Secondary | ICD-10-CM

## 2024-04-13 DIAGNOSIS — D619 Aplastic anemia, unspecified: Secondary | ICD-10-CM | POA: Diagnosis not present

## 2024-04-13 DIAGNOSIS — R630 Anorexia: Secondary | ICD-10-CM

## 2024-04-13 DIAGNOSIS — I10 Essential (primary) hypertension: Secondary | ICD-10-CM

## 2024-04-13 DIAGNOSIS — R7303 Prediabetes: Secondary | ICD-10-CM

## 2024-04-13 MED ORDER — MIRTAZAPINE 7.5 MG PO TABS: 7.5000 mg | ORAL_TABLET | Freq: Every day | ORAL | 3 refills | Status: DC

## 2024-04-13 MED ORDER — DAPAGLIFLOZIN PROPANEDIOL 10 MG PO TABS: 10.0000 mg | ORAL_TABLET | Freq: Every day | ORAL | Status: AC

## 2024-04-13 NOTE — Patient Instructions (Addendum)
 Annual exam mid September , call if you need em sooner  Blood work today  New medication for sleep and appetite, Remeron  Farxiga  samples provided    You are being referred to GI specialist due to weight loss, poor appetite and colonoscopy past due PLEASE KEEP APPOINTMENT  Thanks for choosing [facility], we consider it a privelige to serve you.

## 2024-04-14 ENCOUNTER — Other Ambulatory Visit: Payer: Self-pay

## 2024-04-14 ENCOUNTER — Ambulatory Visit: Payer: Self-pay | Admitting: Family Medicine

## 2024-04-14 DIAGNOSIS — D619 Aplastic anemia, unspecified: Secondary | ICD-10-CM

## 2024-04-14 LAB — CBC WITH DIFFERENTIAL/PLATELET
Basophils Absolute: 0 10*3/uL (ref 0.0–0.2)
Basos: 1 %
EOS (ABSOLUTE): 0.3 10*3/uL (ref 0.0–0.4)
Eos: 7 %
Hematocrit: 39.9 % (ref 37.5–51.0)
Hemoglobin: 12.2 g/dL — ABNORMAL LOW (ref 13.0–17.7)
Immature Grans (Abs): 0 10*3/uL (ref 0.0–0.1)
Immature Granulocytes: 0 %
Lymphocytes Absolute: 1 10*3/uL (ref 0.7–3.1)
Lymphs: 27 %
MCH: 25.6 pg — ABNORMAL LOW (ref 26.6–33.0)
MCHC: 30.6 g/dL — ABNORMAL LOW (ref 31.5–35.7)
MCV: 84 fL (ref 79–97)
Monocytes Absolute: 0.3 10*3/uL (ref 0.1–0.9)
Monocytes: 9 %
Neutrophils Absolute: 2.1 10*3/uL (ref 1.4–7.0)
Neutrophils: 56 %
Platelets: 196 10*3/uL (ref 150–450)
RBC: 4.76 x10E6/uL (ref 4.14–5.80)
RDW: 15.8 % — ABNORMAL HIGH (ref 11.6–15.4)
WBC: 3.7 10*3/uL (ref 3.4–10.8)

## 2024-04-14 LAB — HEMOGLOBIN A1C
Est. average glucose Bld gHb Est-mCnc: 123 mg/dL
Hgb A1c MFr Bld: 5.9 % — ABNORMAL HIGH (ref 4.8–5.6)

## 2024-04-14 LAB — LIPID PANEL W/O CHOL/HDL RATIO
Cholesterol, Total: 159 mg/dL (ref 100–199)
HDL: 70 mg/dL (ref >39)
LDL Chol Calc (NIH): 78 mg/dL (ref 0–99)
Triglycerides: 51 mg/dL (ref 0–149)
VLDL Cholesterol Cal: 11 mg/dL (ref 5–40)

## 2024-04-18 LAB — FERRITIN: Ferritin: 19 ng/mL — AB (ref 30–400)

## 2024-04-18 LAB — SPECIMEN STATUS REPORT

## 2024-04-18 LAB — IRON: Iron: 58 ug/dL (ref 38–169)

## 2024-04-25 ENCOUNTER — Encounter: Payer: Self-pay | Admitting: Family Medicine

## 2024-04-25 DIAGNOSIS — F409 Phobic anxiety disorder, unspecified: Secondary | ICD-10-CM | POA: Insufficient documentation

## 2024-04-25 NOTE — Progress Notes (Signed)
 [name]     MRN: [id number]      DOB: 12-Jan-1941  Chief Complaint  Patient presents with   Medical Management of Chronic Issues    4 month follow up. Pt states one of the medications he takes in the morning makes him feel druggy but unsure which one   Insomnia    Pt states he has been having trouble going to sleep and staying asleep for awhile    HPI [name] is here for follow up and re-evaluation of chronic medical conditions, medication management and review of any available recent lab and radiology data.  Preventive health is updated, specifically  Cancer screening and Immunization.   Questions or concerns regarding consultations or procedures which the PT has had in the interim are  addressed. Concerns as above  ROS Denies recent fever or chills. Denies sinus pressure, nasal congestion, ear pain or sore throat. Denies chest congestion, productive cough or wheezing. Denies chest pains, palpitations and leg swelling Denies abdominal pain, nausea, vomiting,diarrhea or constipation.   Denies dysuria, frequency, hesitancy or incontinence. Chronic  joint pain,  and limitation in mobility. Denies headaches, seizures, numbness, or tingling. Denies depression, does have anxiety a lot of responsibilityor insomnia. Denies skin break down or rash.   PE  BP 138/70   Pulse 67   Resp 16   Ht 5' 7 (1.702 m)   Wt 142 lb 1.9 oz (64.5 kg)   SpO2 98%   BMI 22.26 kg/m   Patient alert and oriented and in no cardiopulmonary distress.  HEENT: No facial asymmetry, EOMI,     Neck supple .  Chest: Clear to auscultation bilaterally.  CVS: S1, S2 no murmurs, no S3.Regular rate.  ABD: Soft non tender.   Ext: No edema  MS: decreased ROM spine, shoulders, hips and knees.  Skin: Intact, no ulcerations or rash noted.  Psych: Good eye contact, normal affect. Memory intact not anxious or depressed appearing.  CNS: CN 2-12 intact, power,  normal throughout.no focal  deficits noted.   Assessment & Plan  Essential hypertension Controlled, no change in medication DASH diet and commitment to daily physical activity for a minimum of 30 minutes discussed and encouraged, as a part of hypertension management. The importance of attaining a healthy weight is also discussed.     04/13/2024    1:25 PM 04/13/2024    1:00 PM 02/06/2024    1:29 PM 01/13/2024   11:58 AM 01/13/2024   11:40 AM 01/13/2024   11:03 AM 08/15/2023    2:28 PM  BP/Weight  Systolic BP 138 147 136 138 140 151 138  Diastolic BP 70 84 76 80 80 86 82  Wt. (Lbs)  142.12 146.2   145.12   BMI  22.26 kg/m2 22.9 kg/m2   22.73 kg/m2        Chronic systolic CHF (congestive heart failure) (HCC) No s/s of decompensation, continue current meds  Prediabetes Patient educated about the importance of limiting  Carbohydrate intake , the need to commit to daily physical activity for a minimum of 30 minutes , and to commit weight loss. The fact that changes in all these areas will reduce or eliminate all together the development of diabetes is stressed.      Latest Ref Rng & Units 04/13/2024    1:39 PM 02/27/2024   11:13 AM 01/07/2024    3:35 PM 06/11/2023   11:21 AM 04/22/2023    1:49 PM  Diabetic Labs  HbA1c 4.8 -  5.6 % 5.9     6.1   Chol 100 - 199 mg/dL 840    842  830   HDL >60 mg/dL 70    82  77   Calc LDL 0 - 99 mg/dL 78    63  80   Triglycerides 0 - 149 mg/dL 51    58  58   Creatinine 0.76 - 1.27 mg/dL  8.41  8.53  8.40  8.48       04/13/2024    1:25 PM 04/13/2024    1:00 PM 02/06/2024    1:29 PM 01/13/2024   11:58 AM 01/13/2024   11:40 AM 01/13/2024   11:03 AM 08/15/2023    2:28 PM  BP/Weight  Systolic BP 138 147 136 138 140 151 138  Diastolic BP 70 84 76 80 80 86 82  Wt. (Lbs)  142.12 146.2   145.12   BMI  22.26 kg/m2 22.9 kg/m2   22.73 kg/m2       03/21/2010   12:00 AM  Foot/eye exam completion dates  Foot exam Order yes       Hyperlipidemia LDL goal <100 Hyperlipidemia:Low fat diet  discussed and encouraged.   Lipid Panel  Lab Results  Component Value Date   CHOL 159 04/13/2024   HDL 70 04/13/2024   LDLCALC 78 04/13/2024   TRIG 51 04/13/2024   CHOLHDL 1.9 06/11/2023     Controlled, no change in medication   Hypothyroidism Controlled, no change in medication   Unintentional weight loss Refer GI for evaluation, also colonoscopy past due to poor appetite  Insomnia due to anxiety and fear Sleep hygiene reviewed and written information offered also. Prescription sent for  medication needed. '  Other urinary incontinence Controlled with medication

## 2024-04-25 NOTE — Assessment & Plan Note (Signed)
 Controlled, no change in medication DASH diet and commitment to daily physical activity for a minimum of 30 minutes discussed and encouraged, as a part of hypertension management. The importance of attaining a healthy weight is also discussed.     04/13/2024    1:25 PM 04/13/2024    1:00 PM 02/06/2024    1:29 PM 01/13/2024   11:58 AM 01/13/2024   11:40 AM 01/13/2024   11:03 AM 08/15/2023    2:28 PM  BP/Weight  Systolic BP 138 147 136 138 140 151 138  Diastolic BP 70 84 76 80 80 86 82  Wt. (Lbs)  142.12 146.2   145.12   BMI  22.26 kg/m2 22.9 kg/m2   22.73 kg/m2

## 2024-04-25 NOTE — Assessment & Plan Note (Signed)
 Patient educated about the importance of limiting  Carbohydrate intake , the need to commit to daily physical activity for a minimum of 30 minutes , and to commit weight loss. The fact that changes in all these areas will reduce or eliminate all together the development of diabetes is stressed.      Latest Ref Rng & Units 04/13/2024    1:39 PM 02/27/2024   11:13 AM 01/07/2024    3:35 PM 06/11/2023   11:21 AM 04/22/2023    1:49 PM  Diabetic Labs  HbA1c 4.8 - 5.6 % 5.9     6.1   Chol 100 - 199 mg/dL 840    842  830   HDL >60 mg/dL 70    82  77   Calc LDL 0 - 99 mg/dL 78    63  80   Triglycerides 0 - 149 mg/dL 51    58  58   Creatinine 0.76 - 1.27 mg/dL  8.41  8.53  8.40  8.48       04/13/2024    1:25 PM 04/13/2024    1:00 PM 02/06/2024    1:29 PM 01/13/2024   11:58 AM 01/13/2024   11:40 AM 01/13/2024   11:03 AM 08/15/2023    2:28 PM  BP/Weight  Systolic BP 138 147 136 138 140 151 138  Diastolic BP 70 84 76 80 80 86 82  Wt. (Lbs)  142.12 146.2   145.12   BMI  22.26 kg/m2 22.9 kg/m2   22.73 kg/m2       03/21/2010   12:00 AM  Foot/eye exam completion dates  Foot exam Order yes

## 2024-04-25 NOTE — Assessment & Plan Note (Signed)
 No s/s of decompensation, continue current meds

## 2024-04-25 NOTE — Assessment & Plan Note (Signed)
 Hyperlipidemia:Low fat diet discussed and encouraged.   Lipid Panel  Lab Results  Component Value Date   CHOL 159 04/13/2024   HDL 70 04/13/2024   LDLCALC 78 04/13/2024   TRIG 51 04/13/2024   CHOLHDL 1.9 06/11/2023     Controlled, no change in medication

## 2024-04-25 NOTE — Assessment & Plan Note (Signed)
Sleep hygiene reviewed and written information offered also. Prescription sent for  medication needed.  

## 2024-04-25 NOTE — Assessment & Plan Note (Signed)
Controlled with medication

## 2024-04-25 NOTE — Assessment & Plan Note (Signed)
 Controlled, no change in medication

## 2024-04-25 NOTE — Assessment & Plan Note (Signed)
 Refer GI for evaluation, also colonoscopy past due to poor appetite

## 2024-04-27 ENCOUNTER — Encounter (INDEPENDENT_AMBULATORY_CARE_PROVIDER_SITE_OTHER): Payer: Self-pay | Admitting: *Deleted

## 2024-05-11 ENCOUNTER — Ambulatory Visit (INDEPENDENT_AMBULATORY_CARE_PROVIDER_SITE_OTHER): Admitting: Gastroenterology

## 2024-05-11 ENCOUNTER — Telehealth: Payer: Self-pay | Admitting: *Deleted

## 2024-05-11 ENCOUNTER — Encounter (INDEPENDENT_AMBULATORY_CARE_PROVIDER_SITE_OTHER): Payer: Self-pay | Admitting: Gastroenterology

## 2024-05-11 VITALS — BP 138/81 | HR 78 | Temp 98.2°F | Ht 67.5 in | Wt 139.6 lb

## 2024-05-11 DIAGNOSIS — Z860100 Personal history of colon polyps, unspecified: Secondary | ICD-10-CM | POA: Insufficient documentation

## 2024-05-11 DIAGNOSIS — R634 Abnormal weight loss: Secondary | ICD-10-CM | POA: Diagnosis not present

## 2024-05-11 DIAGNOSIS — D509 Iron deficiency anemia, unspecified: Secondary | ICD-10-CM | POA: Diagnosis not present

## 2024-05-11 DIAGNOSIS — Z860101 Personal history of adenomatous and serrated colon polyps: Secondary | ICD-10-CM | POA: Diagnosis not present

## 2024-05-11 DIAGNOSIS — D500 Iron deficiency anemia secondary to blood loss (chronic): Secondary | ICD-10-CM | POA: Insufficient documentation

## 2024-05-11 NOTE — Progress Notes (Signed)
 [name] , M.D. Gastroenterology & Hepatology [facility] [address] [place], [place] [address] Primary Care Physician: Antonetta Rollene BRAVO, MD [address] [place] [address]  Chief Complaint: Unintentional weight loss, surveillance colonoscopy given history of advanced adenoma  History of Present Illness: [name] is a 83 y.o. male with history of CABG, thyroidectomy    who presents for evaluation of : Unintentional weight loss, surveillance colonoscopy given history of advanced adenoma  Patient had a colonoscopy in 2014 with advanced adenoma (greater than 10 mm) followed by colonoscopy done 2017 with ablation of angiectasia in the rectum.  Suggest repeat 5 years , since patient is overdue  Patient does not have any GI complaints but has noticed he has lost significant amount of weight even though he continues to be active.  Has lost at least 20 pounds in the past couple months The patient denies having any nausea, vomiting, fever, chills, hematochezia, melena, hematemesis, abdominal distention, abdominal pain, diarrhea, jaundice, pruritus  Last labs from 04/2024 hemoglobin 12.2 hemoglobin 7 MCV 85.9 TSH 3.1  Last ZHI:wnwz  Last Colonoscopy:2017  Examination performed to cecum. Left-sided diverticulosis. Small polyp ablated via cold biopsy from sigmoid colon. Distal rectal telangiectasia with active bleeding( radiation proctitis). These lesions were coagulated with argon plasma coagulator with hemostasis. Small external hemorrhoids.  Recs:  Polyp is tubular adenoma.  Patient will call with progress report in 3 months. If bleeding is frequent and significant amount will consider retreating rectal telangiectasia.  Next colonoscopy in 5 years.   2014  Prep satisfactory. 8 mm polyp snared from the hepatic flexure. Over 20 mm lobulated broad-based polyp snared from hepatic flexure. 2 hemoclips  applied to polypectomy site. Polyp retrieved using a Roth net. Scattered diverticula at sigmoid colon. Multiple telangiectasia at distal rectum. Small hemorrhoids below the dentate line.  2 smaller polyps are tubular adenomas.  20 mm polyp is also tubular adenoma.   FHx: neg for any gastrointestinal/liver disease, no malignancies Social: neg smoking, alcohol or illicit drug use Surgical: no abdominal surgeries  Past Medical History: Past Medical History:  Diagnosis Date   Allergic rhinitis, seasonal    BPH (benign prostatic hypertrophy)    Erectile dysfunction    Heart failure with improved ejection fraction (HFimpEF) (HCC)    a. EF 25-30% by echo in 04/2022 with cath showing normal cors b. EF at 30-35% in 10/2022 c. EF 40-45% in 06/2023 and cardiomyopathy felt to be due to PVC's   History of kidney stones    Hx of thyroidectomy    Hyperlipidemia    Hypertension    Hypothyroidism    Pre-diabetes    Prostate cancer (HCC) 2011   treated with radiation   PVC's (premature ventricular contractions)    Urosepsis 07/2011   ICU admission [facility]    Past Surgical History: Past Surgical History:  Procedure Laterality Date   COLONOSCOPY N/A 09/03/2013   Procedure: COLONOSCOPY;  Surgeon: Claudis RAYMOND Rivet, MD;  [facility];  Service: Endoscopy;  Laterality: N/A;  1225   COLONOSCOPY N/A 11/08/2015   Procedure: COLONOSCOPY;  Surgeon: Claudis RAYMOND Rivet, MD;  Location: [facility];  Service: Endoscopy;  Laterality: N/A;  1200   MASS EXCISION N/A 08/15/2017   Procedure: EXCISION SEBACEOUS CYST NECK;  Surgeon: Mavis Anes, MD;  Location: [facility] ORS;  Service: General;  Laterality: N/A;   PROSTATE SURGERY     RIGHT/LEFT HEART CATH AND CORONARY ANGIOGRAPHY N/A  04/30/2022   Procedure: RIGHT/LEFT HEART CATH AND CORONARY ANGIOGRAPHY;  Surgeon: Swaziland, Peter M, MD;  Location: [facility] INVASIVE CV LAB;  Service: Cardiovascular;  Laterality: N/A;   THYROIDECTOMY  2003   transurethral resection of thr  prostate  2003   ureteral stone extraction  08/2011    Family History: Family History  Problem Relation Age of Onset   Stroke Mother    Diabetes Father    Hypertension Father    Kidney disease Brother    Stroke Brother    Diabetes Brother    Gout Brother    Diabetes Brother     Social History: Social History   Tobacco Use  Smoking Status Never   Passive exposure: Never  Smokeless Tobacco Never   Social History   Substance and Sexual Activity  Alcohol Use No   Alcohol/week: 0.0 standard drinks of alcohol   Social History   Substance and Sexual Activity  Drug Use No    Allergies: Allergies  Allergen Reactions   Other Other (See Comments) and Shortness Of Breath   Peanut-Containing Drug Products Shortness Of Breath and Swelling   Egg-Derived Products Swelling   Iodine  Swelling   Shellfish Allergy Swelling   Penicillins Hives, Rash and Other (See Comments)    Has patient had a PCN reaction causing immediate rash, facial/tongue/throat swelling, SOB or lightheadedness with hypotension: No  Has patient had a PCN reaction causing severe rash involving mucus membranes or skin necrosis: Yes  Has patient had a PCN reaction that required hospitalization No  Has patient had a PCN reaction occurring within the last 10 years: No  If all of the above answers are NO, then may proceed with Cephalosporin use.  Has patient had a PCN reaction causing immediate rash, facial/tongue/throat swelling, SOB or lightheadedness with hypotension: No, Has patient had a PCN reaction causing severe rash involving mucus membranes or skin necrosis: Yes, Has patient had a PCN reaction that required hospitalization No, Has patient had a PCN reaction occurring within the last 10 years: No, If all of the above answers are NO, then may proceed with Cephalosporin use.    Medications: Current Outpatient Medications  Medication Sig Dispense Refill   amiodarone  (PACERONE ) 200 MG tablet Take 200  mg once daily except do not take on Sundays. 180 tablet 3   amLODipine  (NORVASC ) 10 MG tablet TAKE ONE TABLET BY MOUTH ONCE DAILY. 90 tablet 0   aspirin  EC 81 MG tablet Take 1 tablet (81 mg total) by mouth daily. 30 tablet 5   cetirizine  (ZYRTEC ) 10 MG tablet TAKE 1 TABLET EVERY DAY 90 tablet 10   dapagliflozin  propanediol (FARXIGA ) 10 MG TABS tablet Take 1 tablet (10 mg total) by mouth daily before breakfast.     dapagliflozin  propanediol (FARXIGA ) 10 MG TABS tablet Take 1 tablet (10 mg total) by mouth daily before breakfast.     EPINEPHrine  0.3 mg/0.3 mL IJ SOAJ injection Inject 0.3 mLs (0.3 mg total) into the muscle as needed for anaphylaxis. 1 each 2   levothyroxine  (SYNTHROID ) 75 MCG tablet Take 1 tablet (75 mcg total) by mouth daily. 90 tablet 3   losartan  (COZAAR ) 50 MG tablet Take 1 tablet (50 mg total) by mouth daily. 90 tablet 3   lovastatin  (MEVACOR ) 20 MG tablet Take 20 mg by mouth at bedtime.     metoprolol  succinate (TOPROL -XL) 50 MG 24 hr tablet Take 1 tablet (50 mg total) by mouth daily. 90 tablet 1   mirtazapine  (REMERON ) 7.5 MG tablet  Take 1 tablet (7.5 mg total) by mouth at bedtime. 30 tablet 3   Multiple Vitamin (MULTIVITAMIN WITH MINERALS) TABS tablet Take 1 tablet by mouth daily.     spironolactone  (ALDACTONE ) 25 MG tablet Take 12.5 mg by mouth daily.     tadalafil  (CIALIS ) 5 MG tablet TAKE ONE TABLET BY MOUTH ONCE DAILY. 30 tablet 11   tamsulosin  (FLOMAX ) 0.4 MG CAPS capsule Take 1 capsule (0.4 mg total) by mouth daily. 30 capsule 5   No current facility-administered medications for this visit.    Review of Systems: GENERAL: negative for malaise, night sweats HEENT: No changes in hearing or vision, no nose bleeds or other nasal problems. NECK: Negative for lumps, goiter, pain and significant neck swelling RESPIRATORY: Negative for cough, wheezing CARDIOVASCULAR: Negative for chest pain, leg swelling, palpitations, orthopnea GI: SEE HPI MUSCULOSKELETAL: Negative for  joint pain or swelling, back pain, and muscle pain. SKIN: Negative for lesions, rash HEMATOLOGY Negative for prolonged bleeding, bruising easily, and swollen nodes. ENDOCRINE: Negative for cold or heat intolerance, polyuria, polydipsia and goiter. NEURO: negative for tremor, gait imbalance, syncope and seizures. The remainder of the review of systems is noncontributory.   Physical Exam: BP 138/81   Pulse 78   Temp 98.2 F (36.8 C)   Ht 5' 7.5 (1.715 m)   Wt 139 lb 9.6 oz (63.3 kg)   BMI 21.54 kg/m  GENERAL: The patient is AO x3, in no acute distress. HEENT: Head is normocephalic and atraumatic. EOMI are intact. Mouth is well hydrated and without lesions. NECK: Supple. No masses LUNGS: Clear to auscultation. No presence of rhonchi/wheezing/rales. Adequate chest expansion HEART: RRR, normal s1 and s2. ABDOMEN: Soft, nontender, no guarding, no peritoneal signs, and nondistended. BS +. No masses.  Imaging/Labs: as above     Latest Ref Rng & Units 04/13/2024    1:39 PM 04/22/2023    1:49 PM 07/25/2022    1:47 PM  CBC  WBC 3.4 - 10.8 x10E3/uL 3.7  5.2  4.1   Hemoglobin 13.0 - 17.7 g/dL 87.7  85.6  85.1   Hematocrit 37.5 - 51.0 % 39.9  44.7  46.9   Platelets 150 - 450 x10E3/uL 196  184  176    Lab Results  Component Value Date   IRON 58 04/13/2024   FERRITIN 19 (L) 04/13/2024    I personally reviewed and interpreted the available labs, imaging and endoscopic files.  Impression and Plan:  [name] is a 83 y.o. male with history of CABG, thyroidectomy    who presents for evaluation of : Unintentional weight loss, surveillance colonoscopy given history of advanced adenoma  #Unintentional weight loss #Iron deficiency anemia #history of advance adenoma  Patient had unintentional weight loss of 20 pounds in the past couple months.  Recently has become iron deficient with ferritin 19 and hemoglobin 12.2.  All of these are alarm symptoms for a patient at age  89  Patient is overdue for surveillance colonoscopy given history of advanced adenoma removed in 2014.  Surveillance performed 2017 which suggest repeat 5 years  As per ACG guidelines , bidirectional endoscopy is recommended over iron replacement therapy only. We will proceed along with Upper endoscopy with small bowel biopsies and Colonoscopy   Also given continued weight loss will perform cross-sectional imaging with CT abdomen pelvis   All questions were answered.      Haruko Mersch Faizan Amardeep Beckers, MD Gastroenterology and Hepatology [facility]   This chart has been completed  using [employer], and while attempts have been made to ensure accuracy , certain words and phrases may not be transcribed as intended

## 2024-05-11 NOTE — Patient Instructions (Signed)
 It was very nice to meet you today, as dicussed with will plan for the following :  1) Upper endoscopy and colonoscopy  2) Ct scan

## 2024-05-11 NOTE — Telephone Encounter (Signed)
 Called to schedule CT for pt. Pt has an allergy to iodine , was told pt needed to do prep prior to having CT with contrast.

## 2024-05-12 ENCOUNTER — Encounter: Payer: Self-pay | Admitting: *Deleted

## 2024-05-12 ENCOUNTER — Other Ambulatory Visit: Payer: Self-pay | Admitting: *Deleted

## 2024-05-12 MED ORDER — PEG 3350-KCL-NA BICARB-NACL 420 G PO SOLR: 4000.0000 mL | Freq: Once | ORAL | 0 refills | Status: DC

## 2024-05-12 MED ORDER — PREDNISONE 50 MG PO TABS: ORAL_TABLET | ORAL | 0 refills | Status: DC

## 2024-05-12 NOTE — Addendum Note (Signed)
 Addended by: GAYLENE MADELIN CROME on: 05/12/2024 09:41 AM   Modules accepted: Orders

## 2024-05-12 NOTE — Addendum Note (Signed)
 Addended by: GAYLENE MADELIN CROME on: 05/12/2024 09:03 AM   Modules accepted: Orders

## 2024-05-12 NOTE — Telephone Encounter (Signed)
 Pt has been scheduled for CT on 05/18/24, medication sent to pharmacy for pt to take prior to CT scan. Pt informed of instructions for medication to be taking prior to CT and instructions also mailed per pt's request.

## 2024-05-17 ENCOUNTER — Other Ambulatory Visit: Payer: Self-pay | Admitting: *Deleted

## 2024-05-17 MED ORDER — PEG 3350-KCL-NA BICARB-NACL 420 G PO SOLR: 4000.0000 mL | Freq: Once | ORAL | 0 refills | Status: AC

## 2024-05-18 ENCOUNTER — Ambulatory Visit (HOSPITAL_COMMUNITY)
Admission: RE | Admit: 2024-05-18 | Discharge: 2024-05-18 | Disposition: A | Discharge status: Home / Self Care | Source: Ambulatory Visit | Attending: Gastroenterology | Admitting: Gastroenterology

## 2024-05-18 DIAGNOSIS — R634 Abnormal weight loss: Secondary | ICD-10-CM | POA: Diagnosis not present

## 2024-05-18 DIAGNOSIS — K8020 Calculus of gallbladder without cholecystitis without obstruction: Secondary | ICD-10-CM | POA: Diagnosis not present

## 2024-05-18 DIAGNOSIS — K5730 Diverticulosis of large intestine without perforation or abscess without bleeding: Secondary | ICD-10-CM | POA: Diagnosis not present

## 2024-05-18 DIAGNOSIS — N400 Enlarged prostate without lower urinary tract symptoms: Secondary | ICD-10-CM | POA: Diagnosis not present

## 2024-05-18 DIAGNOSIS — N320 Bladder-neck obstruction: Secondary | ICD-10-CM | POA: Diagnosis not present

## 2024-05-18 MED ORDER — IOHEXOL 300 MG/ML  SOLN
100.0000 mL | Freq: Once | INTRAMUSCULAR | Status: AC | PRN
  Administered 2024-05-18: 100 mL via INTRAVENOUS

## 2024-05-22 ENCOUNTER — Other Ambulatory Visit: Payer: Self-pay | Admitting: Family Medicine

## 2024-05-24 ENCOUNTER — Ambulatory Visit (INDEPENDENT_AMBULATORY_CARE_PROVIDER_SITE_OTHER): Payer: Self-pay | Admitting: Gastroenterology

## 2024-06-02 ENCOUNTER — Encounter (HOSPITAL_COMMUNITY): Payer: Self-pay | Admitting: Emergency Medicine

## 2024-06-02 ENCOUNTER — Other Ambulatory Visit: Payer: Self-pay

## 2024-06-02 ENCOUNTER — Inpatient Hospital Stay (HOSPITAL_COMMUNITY)
Admission: EM | Admit: 2024-06-02 | Discharge: 2024-06-05 | DRG: 378 | Disposition: A | Discharge status: Home / Self Care | Attending: Internal Medicine | Admitting: Internal Medicine

## 2024-06-02 DIAGNOSIS — Z8249 Family history of ischemic heart disease and other diseases of the circulatory system: Secondary | ICD-10-CM

## 2024-06-02 DIAGNOSIS — R55 Syncope and collapse: Secondary | ICD-10-CM | POA: Diagnosis present

## 2024-06-02 DIAGNOSIS — D62 Acute posthemorrhagic anemia: Secondary | ICD-10-CM | POA: Diagnosis not present

## 2024-06-02 DIAGNOSIS — E039 Hypothyroidism, unspecified: Secondary | ICD-10-CM | POA: Diagnosis not present

## 2024-06-02 DIAGNOSIS — N529 Male erectile dysfunction, unspecified: Secondary | ICD-10-CM | POA: Diagnosis present

## 2024-06-02 DIAGNOSIS — R7303 Prediabetes: Secondary | ICD-10-CM | POA: Diagnosis present

## 2024-06-02 DIAGNOSIS — D500 Iron deficiency anemia secondary to blood loss (chronic): Secondary | ICD-10-CM | POA: Diagnosis not present

## 2024-06-02 DIAGNOSIS — R9431 Abnormal electrocardiogram [ECG] [EKG]: Secondary | ICD-10-CM | POA: Diagnosis not present

## 2024-06-02 DIAGNOSIS — Z860101 Personal history of adenomatous and serrated colon polyps: Secondary | ICD-10-CM

## 2024-06-02 DIAGNOSIS — K644 Residual hemorrhoidal skin tags: Secondary | ICD-10-CM | POA: Diagnosis present

## 2024-06-02 DIAGNOSIS — D122 Benign neoplasm of ascending colon: Secondary | ICD-10-CM | POA: Diagnosis not present

## 2024-06-02 DIAGNOSIS — I428 Other cardiomyopathies: Secondary | ICD-10-CM | POA: Diagnosis not present

## 2024-06-02 DIAGNOSIS — Z7982 Long term (current) use of aspirin: Secondary | ICD-10-CM | POA: Diagnosis not present

## 2024-06-02 DIAGNOSIS — K449 Diaphragmatic hernia without obstruction or gangrene: Secondary | ICD-10-CM | POA: Diagnosis not present

## 2024-06-02 DIAGNOSIS — I110 Hypertensive heart disease with heart failure: Secondary | ICD-10-CM | POA: Diagnosis not present

## 2024-06-02 DIAGNOSIS — Z79890 Hormone replacement therapy (postmenopausal): Secondary | ICD-10-CM

## 2024-06-02 DIAGNOSIS — N400 Enlarged prostate without lower urinary tract symptoms: Secondary | ICD-10-CM | POA: Diagnosis not present

## 2024-06-02 DIAGNOSIS — E785 Hyperlipidemia, unspecified: Secondary | ICD-10-CM | POA: Diagnosis not present

## 2024-06-02 DIAGNOSIS — Z833 Family history of diabetes mellitus: Secondary | ICD-10-CM

## 2024-06-02 DIAGNOSIS — Z91013 Allergy to seafood: Secondary | ICD-10-CM

## 2024-06-02 DIAGNOSIS — Z91012 Allergy to eggs: Secondary | ICD-10-CM

## 2024-06-02 DIAGNOSIS — I130 Hypertensive heart and chronic kidney disease with heart failure and stage 1 through stage 4 chronic kidney disease, or unspecified chronic kidney disease: Secondary | ICD-10-CM | POA: Diagnosis not present

## 2024-06-02 DIAGNOSIS — Z8546 Personal history of malignant neoplasm of prostate: Secondary | ICD-10-CM

## 2024-06-02 DIAGNOSIS — K921 Melena: Secondary | ICD-10-CM

## 2024-06-02 DIAGNOSIS — I10 Essential (primary) hypertension: Secondary | ICD-10-CM | POA: Diagnosis not present

## 2024-06-02 DIAGNOSIS — E890 Postprocedural hypothyroidism: Secondary | ICD-10-CM | POA: Diagnosis not present

## 2024-06-02 DIAGNOSIS — I5022 Chronic systolic (congestive) heart failure: Secondary | ICD-10-CM

## 2024-06-02 DIAGNOSIS — K5731 Diverticulosis of large intestine without perforation or abscess with bleeding: Secondary | ICD-10-CM | POA: Diagnosis not present

## 2024-06-02 DIAGNOSIS — N1831 Chronic kidney disease, stage 3a: Secondary | ICD-10-CM | POA: Diagnosis present

## 2024-06-02 DIAGNOSIS — I493 Ventricular premature depolarization: Secondary | ICD-10-CM | POA: Diagnosis not present

## 2024-06-02 DIAGNOSIS — Z79899 Other long term (current) drug therapy: Secondary | ICD-10-CM

## 2024-06-02 DIAGNOSIS — R000 Tachycardia, unspecified: Secondary | ICD-10-CM | POA: Diagnosis not present

## 2024-06-02 DIAGNOSIS — Z880 Allergy status to penicillin: Secondary | ICD-10-CM

## 2024-06-02 DIAGNOSIS — Z923 Personal history of irradiation: Secondary | ICD-10-CM

## 2024-06-02 DIAGNOSIS — Z7984 Long term (current) use of oral hypoglycemic drugs: Secondary | ICD-10-CM

## 2024-06-02 DIAGNOSIS — K648 Other hemorrhoids: Secondary | ICD-10-CM | POA: Diagnosis present

## 2024-06-02 DIAGNOSIS — K625 Hemorrhage of anus and rectum: Principal | ICD-10-CM | POA: Diagnosis present

## 2024-06-02 DIAGNOSIS — Z91010 Allergy to peanuts: Secondary | ICD-10-CM

## 2024-06-02 DIAGNOSIS — Z841 Family history of disorders of kidney and ureter: Secondary | ICD-10-CM

## 2024-06-02 DIAGNOSIS — Z888 Allergy status to other drugs, medicaments and biological substances status: Secondary | ICD-10-CM

## 2024-06-02 DIAGNOSIS — J309 Allergic rhinitis, unspecified: Secondary | ICD-10-CM | POA: Diagnosis present

## 2024-06-02 DIAGNOSIS — D509 Iron deficiency anemia, unspecified: Secondary | ICD-10-CM | POA: Diagnosis present

## 2024-06-02 DIAGNOSIS — Z87442 Personal history of urinary calculi: Secondary | ICD-10-CM

## 2024-06-02 LAB — CBC
HCT: 36.2 % — ABNORMAL LOW (ref 39.0–52.0)
Hemoglobin: 11.1 g/dL — ABNORMAL LOW (ref 13.0–17.0)
MCH: 25.9 pg — ABNORMAL LOW (ref 26.0–34.0)
MCHC: 30.7 g/dL (ref 30.0–36.0)
MCV: 84.6 fL (ref 80.0–100.0)
Platelets: 192 K/uL (ref 150–400)
RBC: 4.28 MIL/uL (ref 4.22–5.81)
RDW: 17.4 % — ABNORMAL HIGH (ref 11.5–15.5)
WBC: 5.9 K/uL (ref 4.0–10.5)
nRBC: 0 % (ref 0.0–0.2)

## 2024-06-02 LAB — COMPREHENSIVE METABOLIC PANEL WITH GFR
ALT: 11 U/L (ref 0–44)
AST: 18 U/L (ref 15–41)
Albumin: 3.5 g/dL (ref 3.5–5.0)
Alkaline Phosphatase: 59 U/L (ref 38–126)
Anion gap: 12 (ref 5–15)
BUN: 17 mg/dL (ref 8–23)
CO2: 24 mmol/L (ref 22–32)
Calcium: 8.6 mg/dL — ABNORMAL LOW (ref 8.9–10.3)
Chloride: 107 mmol/L (ref 98–111)
Creatinine, Ser: 1.39 mg/dL — ABNORMAL HIGH (ref 0.61–1.24)
GFR, Estimated: 50 mL/min — ABNORMAL LOW (ref >60)
Glucose, Bld: 102 mg/dL — ABNORMAL HIGH (ref 70–99)
Potassium: 3.8 mmol/L (ref 3.5–5.1)
Sodium: 143 mmol/L (ref 135–145)
Total Bilirubin: 0.9 mg/dL (ref 0.0–1.2)
Total Protein: 6.5 g/dL (ref 6.5–8.1)

## 2024-06-02 LAB — TROPONIN I (HIGH SENSITIVITY)
Troponin I (High Sensitivity): 9 ng/L (ref <18)
Troponin I (High Sensitivity): 9 ng/L (ref <18)

## 2024-06-02 LAB — HEMOGLOBIN AND HEMATOCRIT, BLOOD
HCT: 29.7 % — ABNORMAL LOW (ref 39.0–52.0)
Hemoglobin: 9.1 g/dL — ABNORMAL LOW (ref 13.0–17.0)

## 2024-06-02 LAB — MAGNESIUM: Magnesium: 2.2 mg/dL (ref 1.7–2.4)

## 2024-06-02 LAB — POC OCCULT BLOOD, ED: Fecal Occult Blood: POSITIVE

## 2024-06-02 MED ORDER — PANTOPRAZOLE SODIUM 40 MG IV SOLR
40.0000 mg | Freq: Once | INTRAVENOUS | Status: AC
  Administered 2024-06-02: 40 mg via INTRAVENOUS
  Filled 2024-06-02: qty 10

## 2024-06-02 MED ORDER — POTASSIUM CHLORIDE 20 MEQ PO PACK
40.0000 meq | PACK | Freq: Once | ORAL | Status: AC
  Administered 2024-06-02: 40 meq via ORAL
  Filled 2024-06-02: qty 2

## 2024-06-02 MED ORDER — LACTATED RINGERS IV SOLN
INTRAVENOUS | Status: AC
  Administered 2024-06-02: 1000 mL via INTRAVENOUS

## 2024-06-02 NOTE — Assessment & Plan Note (Signed)
 Follows with Dr. Debera, he was started on amiodarone , resume

## 2024-06-02 NOTE — H&P (Addendum)
 History and Physical    [name] FMW:[id number] DOB: 12-02-1940 DOA: 06/02/2024  PCP: Paul Rollene BRAVO, MD   Patient coming from: Home  I have personally briefly reviewed patient's old medical records in [facility]  Chief Complaint: Rectal bleed, syncope  HPI: [name] is a 83 y.o. male with medical history significant for systolic CHF, hypertension, hypothyroidism, nonischemic cardiomyopathy, BPH. Patient presented to the ED with complaints of rectal bleeding that started yesterday, he reports about 10 episodes since onset.  He denies black stools, no vomiting no abdominal pain.  He rarely takes NSAIDs.  He is on baby aspirin . Today after he had had a shower and was getting dressing up, he felt suddenly dizzy, and sat on his bed, he did not fall, he did not lose consciousness.  No chest pain no difficulty breathing.  He has chronic bilateral lower extremity swelling that is unchanged from baseline.  ED Course: Blood pressure systolic 130-151.  Heart rate 55-76. Hemoglobin 11.1, down from 12 checked a month ago. Troponin 9 EKG shows sinus rhythm, prolonged QTc, nonspecific T wave abnormalities not significantly changed from prior. EDP talk to Dr. Shaaron, will see in consult, clear liquid diet, n.p.o. midnight, PPI.  Review of Systems: As per HPI all other systems reviewed and negative.  Past Medical History:  Diagnosis Date   Allergic rhinitis, seasonal    BPH (benign prostatic hypertrophy)    Erectile dysfunction    Heart failure with improved ejection fraction (HFimpEF) (HCC)    a. EF 25-30% by echo in 04/2022 with cath showing normal cors b. EF at 30-35% in 10/2022 c. EF 40-45% in 06/2023 and cardiomyopathy felt to be due to PVC's   History of kidney stones    Hx of thyroidectomy    Hyperlipidemia    Hypertension    Hypothyroidism    Pre-diabetes    Prostate cancer (HCC) 2011   treated with radiation   PVC's (premature ventricular contractions)     Urosepsis 07/2011   ICU admission [facility]    Past Surgical History:  Procedure Laterality Date   COLONOSCOPY N/A 09/03/2013   Procedure: COLONOSCOPY;  Surgeon: Paul RAYMOND Rivet, MD;  [facility];  Service: Endoscopy;  Laterality: N/A;  1225   COLONOSCOPY N/A 11/08/2015   Procedure: COLONOSCOPY;  Surgeon: Paul RAYMOND Rivet, MD;  [facility];  Service: Endoscopy;  Laterality: N/A;  1200   MASS EXCISION N/A 08/15/2017   Procedure: EXCISION SEBACEOUS CYST NECK;  Surgeon: Paul Anes, MD;  Location: [facility] ORS;  Service: General;  Laterality: N/A;   PROSTATE SURGERY     RIGHT/LEFT HEART CATH AND CORONARY ANGIOGRAPHY N/A 04/30/2022   Procedure: RIGHT/LEFT HEART CATH AND CORONARY ANGIOGRAPHY;  Surgeon: Swaziland, Paul M, MD;  Location: [facility] INVASIVE CV LAB;  Service: Cardiovascular;  Laterality: N/A;   THYROIDECTOMY  2003   transurethral resection of thr prostate  2003   ureteral stone extraction  08/2011     reports that he has never smoked. He has never been exposed to tobacco smoke. He has never used smokeless tobacco. He reports that he does not drink alcohol and does not use drugs.  Allergies  Allergen Reactions   Peanut-Containing Drug Products Shortness Of Breath and Swelling   Egg-Derived Products Swelling   Iodine  Swelling   Penicillins Hives, Rash and Other (See Comments)    Severe rash involving mucus membranes or skin necrosis   Shellfish Allergy Swelling    Family History  Problem Relation Age of Onset   Stroke Mother    Diabetes Father    Hypertension Father    Kidney disease Brother    Stroke Brother    Diabetes Brother    Gout Brother    Diabetes Brother     Prior to Admission medications   Medication Sig Start Date End Date Taking? Authorizing Provider  amiodarone  (PACERONE ) 200 MG tablet Take 200 mg once daily except do not take on Sundays. Patient taking differently: Take 200 mg by mouth daily. Take 200 mg once daily except do not take on  Sundays. 02/06/24  Yes Strader, Paul M, PA-C  amLODipine  (NORVASC ) 10 MG tablet TAKE ONE TABLET BY MOUTH ONCE DAILY. 11/24/23  Yes Paul Rollene BRAVO, MD  aspirin  EC 81 MG tablet Take 1 tablet (81 mg total) by mouth daily. 12/20/13  Yes Paul Rollene BRAVO, MD  cetirizine  (ZYRTEC ) 10 MG tablet TAKE 1 TABLET EVERY DAY Patient taking differently: Take 10 mg by mouth daily. 08/19/22  Yes Paul Rollene BRAVO, MD  dapagliflozin  propanediol (FARXIGA ) 10 MG TABS tablet Take 1 tablet (10 mg total) by mouth daily before breakfast. 04/13/24  Yes Paul Rollene BRAVO, MD  EPINEPHrine  0.3 mg/0.3 mL IJ SOAJ injection Inject 0.3 mLs (0.3 mg total) into the muscle as needed for anaphylaxis. 08/04/19  Yes Paul Rollene BRAVO, MD  levothyroxine  (SYNTHROID ) 100 MCG tablet Take 100 mcg by mouth daily. 05/22/24  Yes [provider]  levothyroxine  (SYNTHROID ) 75 MCG tablet Take 1 tablet (75 mcg total) by mouth daily. 05/24/24  Yes Paul Rollene BRAVO, MD  losartan  (COZAAR ) 50 MG tablet Take 1 tablet (50 mg total) by mouth daily. 07/30/23  Yes Paul Paul CROME, MD  lovastatin  (MEVACOR ) 20 MG tablet Take 20 mg by mouth at bedtime. 02/02/24  Yes [provider]  metoprolol  succinate (TOPROL -XL) 50 MG 24 hr tablet Take 1 tablet (50 mg total) by mouth daily. 01/13/24  Yes Paul Rollene BRAVO, MD  mirtazapine  (REMERON ) 7.5 MG tablet Take 1 tablet (7.5 mg total) by mouth at bedtime. Patient taking differently: Take 3.75 mg by mouth at bedtime. 04/13/24  Yes Paul Rollene BRAVO, MD  Multiple Vitamin (MULTIVITAMIN WITH MINERALS) TABS tablet Take 1 tablet by mouth daily.   Yes [provider]  spironolactone  (ALDACTONE ) 25 MG tablet Take 25 mg by mouth daily as needed (swelling). 03/05/24  Yes [provider]  tadalafil  (CIALIS ) 5 MG tablet TAKE ONE TABLET BY MOUTH ONCE DAILY. 07/03/23  Yes Paul Rollene BRAVO, MD  tamsulosin  (FLOMAX ) 0.4 MG CAPS capsule Take 1 capsule (0.4 mg total) by mouth daily.  01/13/24  Yes Paul Rollene BRAVO, MD    Physical Exam: Vitals:   06/02/24 1500 06/02/24 1515 06/02/24 1530 06/02/24 1645  BP: 130/87 (!) 144/88 139/89 (!) 149/95  Pulse: 61 62 64 76  Resp: 12 12 17 11   Temp:      TempSrc:      SpO2: 98% 99% 99% 97%    Constitutional: NAD, calm, comfortable Vitals:   06/02/24 1500 06/02/24 1515 06/02/24 1530 06/02/24 1645  BP: 130/87 (!) 144/88 139/89 (!) 149/95  Pulse: 61 62 64 76  Resp: 12 12 17 11   Temp:      TempSrc:      SpO2: 98% 99% 99% 97%   Eyes: PERRL, lids and conjunctivae normal ENMT: Mucous membranes are moist.  Neck: normal, supple, no masses, no thyromegaly Respiratory: clear to auscultation bilaterally, no wheezing, no crackles. Normal respiratory effort. No  accessory muscle use.  Cardiovascular: Regular rate and rhythm, no murmurs / rubs / gallops. No extremity edema.  1+ pitting bilateral lower extremity edema to mid leg-stable. Abdomen: no tenderness, no masses palpated. No hepatosplenomegaly. Bowel sounds positive.  Musculoskeletal: no clubbing / cyanosis. No joint deformity upper and lower extremities.   Skin: no rashes, lesions, ulcers. No induration Neurologic: No facial asymmetry, moving extremity spontaneously, speech fluent. Psychiatric: Normal judgment and insight. Alert and oriented x 3. Normal mood.   Labs on Admission: I have personally reviewed following labs and imaging studies  CBC: Recent Labs  Lab 06/02/24 1353  WBC 5.9  HGB 11.1*  HCT 36.2*  MCV 84.6  PLT 192   Basic Metabolic Panel: Recent Labs  Lab 06/02/24 1353  NA 143  K 3.8  CL 107  CO2 24  GLUCOSE 102*  BUN 17  CREATININE 1.39*  CALCIUM 8.6*   GFR: CrCl cannot be calculated (Unknown ideal weight.). Liver Function Tests: Recent Labs  Lab 06/02/24 1353  AST 18  ALT 11  ALKPHOS 59  BILITOT 0.9  PROT 6.5  ALBUMIN 3.5   Urine analysis:    Component Value Date/Time   COLORURINE AMBER (A) 07/16/2011 1624   APPEARANCEUR  CLOUDY (A) 07/16/2011 1624   LABSPEC 1.027 07/16/2011 1624   PHURINE 5.0 07/16/2011 1624   GLUCOSEU NEGATIVE 07/16/2011 1624   HGBUR LARGE (A) 07/16/2011 1624   BILIRUBINUR SMALL (A) 07/16/2011 1624   KETONESUR NEGATIVE 07/16/2011 1624   PROTEINUR 100 (A) 07/16/2011 1624   UROBILINOGEN 1.0 07/16/2011 1624   NITRITE NEGATIVE 07/16/2011 1624   LEUKOCYTESUR MODERATE (A) 07/16/2011 1624    Radiological Exams on Admission: No results found.  EKG: Independently reviewed.  Sinus rhythm, rate 67, QTc 529.  Diffuse nonspecific ST-T abnormalities, unchanged from prior.  Assessment/Plan Principal Problem:   Rectal bleed Active Problems:   Near syncope   Hypothyroidism   Essential hypertension   Chronic systolic CHF (congestive heart failure) (HCC)   Frequent PVCs   Non-ischemic cardiomyopathy (HCC)   Assessment and Plan: * Rectal bleed Multiple episodes over the past 24 hours.  Denies melena or hematemesis, no abdominal pain.  Denies NSAID use. He is on 81mg  aspirin .  Hemoglobin 11.1, down from 12.2 a month ago.  Recent CT with sigmoid diverticulosis. He had colonoscopy planned for 8/26 by Dr. Cinderella. Last colonoscopy 2016, unfortunately I am unable to see the details.  No EGD on file. -Trend hemoglobin - LR 50cc/hr x 12hrs - EDP talked to Dr. Naomie liquid diet, n.p.o. midnight, PPIs, will see in consult in a.Cooper. -IV Protonix  40 daily -Hold aspirin   Near syncope In the setting of rectal bleeding.  Last echo 06/2023 EF 40 to 45%, G1DD.  EKG unchanged diffuse nonspecific T wave abnormalities, with prolonged QTc. - Troponin 9 > 8.  - Check orthostatic vitals  Frequent PVCs Follows with Dr. Debera, he was started on amiodarone , resume  Chronic systolic CHF (congestive heart failure) (HCC) Stable and compensated-chronic unchanged lower extremity edema.  Last echo 06/2023 EF of 40 to 45%, G1DD.  Not on diuretics. - Farxiga , spironolactone , held in the setting of GI  bleed  Essential hypertension Blood pressure stable. -Hold Norvasc  10 mg, losartan  50 mg, metoprolol  in the setting of acute GI bleed.  Hypothyroidism Resume Synthroid   Prolonged QTc- new -Potassium 3.8, check magnesium    DVT prophylaxis: SCDS Code Status: FULL code-family patient, daughter and spouse at bedside. Family Communication: Spouse and Daughter at bedside Disposition Plan: ~  2 days Consults called: GI Admission status: Inpt Tele  I certify that at the point of admission it is my clinical judgment that the patient will require inpatient hospital care spanning beyond 2 midnights from the point of admission due to high intensity of service, high risk for further deterioration and high frequency of surveillance required.   Author: Tully FORBES Carwin, MD 06/02/2024 7:38 PM  For on call review [email or web address].

## 2024-06-02 NOTE — Assessment & Plan Note (Signed)
 Resume Synthroid 

## 2024-06-02 NOTE — Assessment & Plan Note (Addendum)
 In the setting of rectal bleeding.  Last echo 06/2023 EF 40 to 45%, G1DD.  EKG unchanged diffuse nonspecific T wave abnormalities, with prolonged QTc. - Troponin 9 > 8.  - Check orthostatic vitals

## 2024-06-02 NOTE — ED Notes (Signed)
 Patient ambulated to the restroom one assist. Patient had a Bowel Movement, Blood noted in the toilet. MD notified.

## 2024-06-02 NOTE — Assessment & Plan Note (Signed)
 Blood pressure stable. -Hold Norvasc  10 mg, losartan  50 mg, metoprolol  in the setting of acute GI bleed.

## 2024-06-02 NOTE — ED Provider Notes (Signed)
 [place] EMERGENCY DEPARTMENT AT [facility] Provider Note   CSN: [id number] Arrival date & time: 06/02/24  1327     Patient presents with: Rectal Bleeding   [name] is a 83 y.o. male. He has history of CHF, thyroidectomy, prostate cancer, hypertension, hyperlipidemia.  Presents to the ER for evaluation of bright red blood per rectum that started yesterday.  States he was mowing his grass and felt the urge to have a bowel movement, noticed bright red blood mixed with brown stool was mixed of formed and liquid stool.  This happened 3-4 times last night and at least 3-4 times a day including once in the ER just prior to my evaluation.  He states when getting dressed this morning after he had gotten a bath he got very lightheaded and sweaty and had some chest pain, he did not lose consciousness or fall or hit his head.  He has no longer having any chest pain he has not any shortness of breath, no vertigo.  He was worried because he has continued to have multiple episodes of bright red blood per rectum.  He states he was scheduled for colonoscopy coming up in 6 days.  He reports he only ever had rectal bleeding once before this was more than 25 years ago.    Rectal Bleeding      Prior to Admission medications   Medication Sig Start Date End Date Taking? Authorizing Provider  amiodarone  (PACERONE ) 200 MG tablet Take 200 mg once daily except do not take on Sundays. 02/06/24   Strader, Laymon HERO, PA-C  amLODipine  (NORVASC ) 10 MG tablet TAKE ONE TABLET BY MOUTH ONCE DAILY. 11/24/23   Antonetta Rollene BRAVO, MD  aspirin  EC 81 MG tablet Take 1 tablet (81 mg total) by mouth daily. 12/20/13   Antonetta Rollene BRAVO, MD  cetirizine  (ZYRTEC ) 10 MG tablet TAKE 1 TABLET EVERY DAY 08/19/22   Antonetta Rollene BRAVO, MD  dapagliflozin  propanediol (FARXIGA ) 10 MG TABS tablet Take 1 tablet (10 mg total) by mouth daily before breakfast. 01/13/24   Antonetta Rollene BRAVO, MD  dapagliflozin  propanediol  (FARXIGA ) 10 MG TABS tablet Take 1 tablet (10 mg total) by mouth daily before breakfast. 04/13/24   Antonetta Rollene BRAVO, MD  EPINEPHrine  0.3 mg/0.3 mL IJ SOAJ injection Inject 0.3 mLs (0.3 mg total) into the muscle as needed for anaphylaxis. 08/04/19   Antonetta Rollene BRAVO, MD  levothyroxine  (SYNTHROID ) 100 MCG tablet Take 100 mcg by mouth daily. 05/22/24   [provider]  levothyroxine  (SYNTHROID ) 75 MCG tablet Take 1 tablet (75 mcg total) by mouth daily. 05/24/24   Antonetta Rollene BRAVO, MD  losartan  (COZAAR ) 50 MG tablet Take 1 tablet (50 mg total) by mouth daily. 07/30/23   Kate Lonni CROME, MD  lovastatin  (MEVACOR ) 20 MG tablet Take 20 mg by mouth at bedtime. 02/02/24   [provider]  metoprolol  succinate (TOPROL -XL) 50 MG 24 hr tablet Take 1 tablet (50 mg total) by mouth daily. 01/13/24   Antonetta Rollene BRAVO, MD  mirtazapine  (REMERON ) 7.5 MG tablet Take 1 tablet (7.5 mg total) by mouth at bedtime. 04/13/24   Antonetta Rollene BRAVO, MD  Multiple Vitamin (MULTIVITAMIN WITH MINERALS) TABS tablet Take 1 tablet by mouth daily.    [provider]  predniSONE  (DELTASONE ) 50 MG tablet TAKE 1 TABLET BY MOUTH 13 HOURS, 7 HOURS AND 1 HOUR PRIOR TO CT 05/12/24   Cinderella Deatrice FALCON, MD  spironolactone  (ALDACTONE ) 25 MG tablet Take 12.5 mg  by mouth daily. 03/05/24   [provider]  tadalafil  (CIALIS ) 5 MG tablet TAKE ONE TABLET BY MOUTH ONCE DAILY. 07/03/23   Antonetta Rollene BRAVO, MD  tamsulosin  (FLOMAX ) 0.4 MG CAPS capsule Take 1 capsule (0.4 mg total) by mouth daily. 01/13/24   Antonetta Rollene BRAVO, MD    Allergies: Peanut-containing drug products, Egg-derived products, Iodine , Penicillins, and Shellfish allergy    Review of Systems  Gastrointestinal:  Positive for hematochezia.    Updated Vital Signs BP 139/89   Pulse 64   Temp 97.9 F (36.6 C) (Oral)   Resp 17   SpO2 99%   Physical Exam Vitals and nursing note reviewed.  Constitutional:      General: He is not in  acute distress.    Appearance: He is well-developed.  HENT:     Head: Normocephalic and atraumatic.     Mouth/Throat:     Mouth: Mucous membranes are moist.  Eyes:     Conjunctiva/sclera: Conjunctivae normal.  Cardiovascular:     Rate and Rhythm: Normal rate and regular rhythm.     Heart sounds: No murmur heard. Pulmonary:     Effort: Pulmonary effort is normal. No respiratory distress.     Breath sounds: Normal breath sounds.  Abdominal:     Palpations: Abdomen is soft.     Tenderness: There is no abdominal tenderness. There is no guarding or rebound.  Genitourinary:    Rectum: Guaiac result positive.     Comments: Noninflamed skin tags noted externally, no rectal tenderness, no fissure.  Stool is brown mixed with some dark red blood guaiac positive.  ED tech present for chaperone. Musculoskeletal:        General: No swelling.     Cervical back: Neck supple.  Skin:    General: Skin is warm and dry.     Capillary Refill: Capillary refill takes less than 2 seconds.  Neurological:     General: No focal deficit present.     Mental Status: He is alert and oriented to person, place, and time.     Motor: No weakness.     Gait: Gait normal.  Psychiatric:        Mood and Affect: Mood normal.        Behavior: Behavior normal.     (all labs ordered are listed, but only abnormal results are displayed) Labs Reviewed  COMPREHENSIVE METABOLIC PANEL WITH GFR - Abnormal; Notable for the following components:      Result Value   Glucose, Bld 102 (*)    Creatinine, Ser 1.39 (*)    Calcium 8.6 (*)    GFR, Estimated 50 (*)    All other components within normal limits  CBC - Abnormal; Notable for the following components:   Hemoglobin 11.1 (*)    HCT 36.2 (*)    MCH 25.9 (*)    RDW 17.4 (*)    All other components within normal limits  POC OCCULT BLOOD, ED  TYPE AND SCREEN    EKG: None  Radiology: No results found.   Procedures   Medications Ordered in the ED - No data to  display                                  Medical Decision Making This patient presents to the ED for concern of dizziness this morning with multiple episodes of bright red blood per rectum since yesterday, this involves  an extensive number of treatment options, and is a complaint that carries with it a high risk of complications and morbidity.  The differential diagnosis includes angina, dysrhythmia, ACS, orthostatic hypotension, upper GI bleeding, lower GI bleeding, other   Co morbidities that complicate the patient evaluation :   Heart disease, currently undergoing workup for recent weight loss with [place] colonoscopy on August 26   Additional history obtained:  Additional history obtained from EMR External records from outside source obtained and reviewed including previous notes, labs and imaging including recent CT abdomen pelvis which was reassuring   Lab Tests:  I Ordered, and personally interpreted labs.  The pertinent results include: Hemoglobin is 11.1 today compared to 12.67-month ago and 14 years ago. Troponin 9 CMP with baseline renal function and normal electrolytes     Cardiac Monitoring: / EKG:  The patient was maintained on a cardiac monitor.  I personally viewed and interpreted the cardiac monitored which showed an underlying rhythm of: Sinus rhythm, QTC is prolonged at 529  Consultations Obtained:  I requested consultation with the call gastroenterologist Dr. Shaaron,  and discussed lab and imaging findings as well as pertinent plan - they recommend: Patient hospital service PPI, n.p.o. past midnight clear liquids, and hemoglobin and they will see him first thing in the morning to determine if he needs procedure.  Discussed with hospitalist Dr. FORBES Carwin for further evaluation of near syncope and GI bleeding.   Problem List / ED Course / Critical interventions / Medication management  Syncope-troponin negative patient chest pain-free since the episode this  morning, EKG with QTc prolonged at 529.  Also having rectal bleeding further blood per rectum, had episode in the ER today.  His vitals have been stable he is not tachycardic or tachypneic his hemoglobin is 11.1.  Discussed with GI as above and plan to admit and have GI see tomorrow for this as well.  Patient being admitted for further evaluation due to both GI bleeding and near syncope. I ordered medication including Protonix  for GI bleeding  I have reviewed the patients home medicines and have made adjustments as needed   Social Determinants of Health:  Patient lives independently       Amount and/or Complexity of Data Reviewed Labs: ordered.  Risk Prescription drug management. Decision regarding hospitalization.        Final diagnoses:  None    ED Discharge Orders     None          Suellen Sherran DELENA DEVONNA 06/02/24 TRENNA Suzette Pac, MD 06/04/24 1230

## 2024-06-02 NOTE — Assessment & Plan Note (Addendum)
 Multiple episodes over the past 24 hours.  Denies melena or hematemesis, no abdominal pain.  Denies NSAID use. He is on 81mg  aspirin .  Hemoglobin 11.1, down from 12.2 a month ago.  Recent CT with sigmoid diverticulosis. He had colonoscopy planned for 8/26 by Dr. Cinderella. Last colonoscopy 2016, unfortunately I am unable to see the details.  No EGD on file. -Trend hemoglobin - LR 50cc/hr x 12hrs - EDP talked to Dr. Naomie liquid diet, n.p.o. midnight, PPIs, will see in consult in a.m. -IV Protonix  40 daily -Hold aspirin 

## 2024-06-02 NOTE — Assessment & Plan Note (Addendum)
 Stable and compensated-chronic unchanged lower extremity edema.  Last echo 06/2023 EF of 40 to 45%, G1DD.  Not on diuretics. - Farxiga , spironolactone , held in the setting of GI bleed

## 2024-06-02 NOTE — ED Triage Notes (Signed)
 Pt reports bright red blood per rectum for several episodes since yesterday.  This morning while getting dressed believes he passed out and fell back onto the bed. No SHOB.  No thinners.

## 2024-06-03 ENCOUNTER — Observation Stay (HOSPITAL_BASED_OUTPATIENT_CLINIC_OR_DEPARTMENT_OTHER)

## 2024-06-03 ENCOUNTER — Other Ambulatory Visit (HOSPITAL_COMMUNITY): Payer: Self-pay | Admitting: *Deleted

## 2024-06-03 DIAGNOSIS — R55 Syncope and collapse: Secondary | ICD-10-CM

## 2024-06-03 DIAGNOSIS — D62 Acute posthemorrhagic anemia: Secondary | ICD-10-CM | POA: Diagnosis not present

## 2024-06-03 DIAGNOSIS — D509 Iron deficiency anemia, unspecified: Secondary | ICD-10-CM

## 2024-06-03 DIAGNOSIS — R634 Abnormal weight loss: Secondary | ICD-10-CM

## 2024-06-03 DIAGNOSIS — K625 Hemorrhage of anus and rectum: Secondary | ICD-10-CM | POA: Diagnosis not present

## 2024-06-03 LAB — CBC
HCT: 27.5 % — ABNORMAL LOW (ref 39.0–52.0)
HCT: 28.3 % — ABNORMAL LOW (ref 39.0–52.0)
HCT: 25.2 % — ABNORMAL LOW (ref 39.0–52.0)
Hemoglobin: 8.3 g/dL — ABNORMAL LOW (ref 13.0–17.0)
Hemoglobin: 8.7 g/dL — ABNORMAL LOW (ref 13.0–17.0)
Hemoglobin: 7.7 g/dL — ABNORMAL LOW (ref 13.0–17.0)
MCH: 25.8 pg — ABNORMAL LOW (ref 26.0–34.0)
MCH: 26 pg (ref 26.0–34.0)
MCH: 25.8 pg — ABNORMAL LOW (ref 26.0–34.0)
MCHC: 30.2 g/dL (ref 30.0–36.0)
MCHC: 30.7 g/dL (ref 30.0–36.0)
MCHC: 30.6 g/dL (ref 30.0–36.0)
MCV: 85.4 fL (ref 80.0–100.0)
MCV: 84.5 fL (ref 80.0–100.0)
MCV: 84.6 fL (ref 80.0–100.0)
Platelets: 162 K/uL (ref 150–400)
Platelets: 176 K/uL (ref 150–400)
Platelets: 168 K/uL (ref 150–400)
RBC: 3.22 MIL/uL — ABNORMAL LOW (ref 4.22–5.81)
RBC: 3.35 MIL/uL — ABNORMAL LOW (ref 4.22–5.81)
RBC: 2.98 MIL/uL — ABNORMAL LOW (ref 4.22–5.81)
RDW: 16.9 % — ABNORMAL HIGH (ref 11.5–15.5)
RDW: 17.2 % — ABNORMAL HIGH (ref 11.5–15.5)
RDW: 16.9 % — ABNORMAL HIGH (ref 11.5–15.5)
WBC: 5.2 K/uL (ref 4.0–10.5)
WBC: 4.7 K/uL (ref 4.0–10.5)
WBC: 6.1 K/uL (ref 4.0–10.5)
nRBC: 0 % (ref 0.0–0.2)
nRBC: 0 % (ref 0.0–0.2)
nRBC: 0 % (ref 0.0–0.2)

## 2024-06-03 LAB — ECHOCARDIOGRAM COMPLETE
Area-P 1/2: 2.6 cm2
Calc EF: 46.7 %
Est EF: 45
S' Lateral: 3.9 cm
Single Plane A2C EF: 48.2 %
Single Plane A4C EF: 50.3 %

## 2024-06-03 MED ORDER — LEVOTHYROXINE SODIUM 50 MCG PO TABS: 100.0000 ug | ORAL_TABLET | Freq: Every day | ORAL | Status: DC

## 2024-06-03 MED ORDER — LACTATED RINGERS IV SOLN: INTRAVENOUS | Status: AC

## 2024-06-03 MED ORDER — AMIODARONE HCL 200 MG PO TABS
200.0000 mg | ORAL_TABLET | Freq: Every day | ORAL | Status: DC
  Administered 2024-06-03 – 2024-06-05 (×3): 200 mg via ORAL
  Filled 2024-06-03 (×3): qty 1

## 2024-06-03 MED ORDER — SODIUM CHLORIDE 0.9 % IV SOLN: INTRAVENOUS | Status: AC

## 2024-06-03 MED ORDER — POLYETHYLENE GLYCOL 3350 17 G PO PACK: 17.0000 g | PACK | Freq: Every day | ORAL | Status: DC | PRN

## 2024-06-03 MED ORDER — ACETAMINOPHEN 650 MG RE SUPP: 650.0000 mg | Freq: Four times a day (QID) | RECTAL | Status: DC | PRN

## 2024-06-03 MED ORDER — TAMSULOSIN HCL 0.4 MG PO CAPS
0.4000 mg | ORAL_CAPSULE | Freq: Every day | ORAL | Status: DC
  Administered 2024-06-03 – 2024-06-05 (×3): 0.4 mg via ORAL
  Filled 2024-06-03 (×3): qty 1

## 2024-06-03 MED ORDER — ACETAMINOPHEN 325 MG PO TABS
650.0000 mg | ORAL_TABLET | Freq: Four times a day (QID) | ORAL | Status: DC | PRN
Start: 2024-06-03 — End: 2024-06-05

## 2024-06-03 MED ORDER — PANTOPRAZOLE SODIUM 40 MG IV SOLR
40.0000 mg | INTRAVENOUS | Status: DC
  Administered 2024-06-03 – 2024-06-05 (×3): 40 mg via INTRAVENOUS
  Filled 2024-06-03 (×3): qty 10

## 2024-06-03 MED ORDER — LEVOTHYROXINE SODIUM 50 MCG PO TABS: 75.0000 ug | ORAL_TABLET | Freq: Every day | ORAL | Status: DC

## 2024-06-03 MED ORDER — MIRTAZAPINE 7.5 MG PO TABS
3.7500 mg | ORAL_TABLET | Freq: Every day | ORAL | Status: DC
Start: 2024-06-03 — End: 2024-06-05
  Administered 2024-06-03 – 2024-06-04 (×2): 3.75 mg via ORAL
  Filled 2024-06-03 (×3): qty 1

## 2024-06-03 MED ORDER — LEVOTHYROXINE SODIUM 50 MCG PO TABS
175.0000 ug | ORAL_TABLET | Freq: Every day | ORAL | Status: DC
  Administered 2024-06-03 – 2024-06-05 (×3): 175 ug via ORAL
  Filled 2024-06-03 (×2): qty 1
  Filled 2024-06-03: qty 4

## 2024-06-03 MED ORDER — PEG 3350-KCL-NA BICARB-NACL 420 G PO SOLR
4000.0000 mL | Freq: Once | ORAL | Status: AC
  Administered 2024-06-03: 4000 mL via ORAL

## 2024-06-03 MED ORDER — BISACODYL 5 MG PO TBEC
10.0000 mg | DELAYED_RELEASE_TABLET | Freq: Once | ORAL | Status: AC
  Administered 2024-06-03: 10 mg via ORAL
  Filled 2024-06-03: qty 2

## 2024-06-03 MED ORDER — LORATADINE 10 MG PO TABS
10.0000 mg | ORAL_TABLET | Freq: Every day | ORAL | Status: DC
  Administered 2024-06-03 – 2024-06-05 (×3): 10 mg via ORAL
  Filled 2024-06-03 (×3): qty 1

## 2024-06-03 MED ORDER — PRAVASTATIN SODIUM 10 MG PO TABS
20.0000 mg | ORAL_TABLET | Freq: Every day | ORAL | Status: DC
  Administered 2024-06-03 – 2024-06-04 (×2): 20 mg via ORAL
  Filled 2024-06-03 (×3): qty 2

## 2024-06-03 NOTE — Consult Note (Signed)
 Gastroenterology Consult   Referring Provider: No ref. provider found Primary Care Physician:  Antonetta Rollene BRAVO, MD Primary Gastroenterologist:  Dr. Cinderella  Patient ID: [name]; [id number]; [birth year]   Admit date: 06/02/2024  LOS: 0 days   Date of Consultation: 06/03/2024  Reason for Consultation:  rectal bleeding    History of Present Illness   [name] is a 83 y.o. male with PMH of PVCs, prostate Ca, Hypothyroidism s/p thyroidectomy, HTN, HLD, HFimpEF, BPH, kidney stones presenting to the ED with multiple episodes of large volume rectal bleeding with symptomatic anemia.   In ED: BP systolic in the 120-150 range, HR 55-90. EKG with sinus rhythm, prolonged QTc, nonspecific T wave abnormalities not significantly changed from prior. Stool heme +. Troponins 9-->9  Hgb 11.1 (down from 12.2 last month)-->9.1-->8.3-->8.7.  ECHO for today pending.    GI consult: recently seen by Dr. Cinderella for unintentional weight loss and surveillance colonoscopy for history of advanced adenoma. Patient reported 20 pound weight loss in 3 months. Denies abdominal pain, n/v. He has BM every 2-3 days. At time of ov, he had reported no melena, brbpr. Noted to have new iron deficiency with ferritin 19, Hgb 12.2. plans for colonoscopy/EGD currently scheduled for next week as outpatient.  Patient presented to ED yesterday with rectal bleeding that started the day before. He had multiple episodes prior to presentation, upwards to 10. Describes large volume fresh blood mixed in stool. No melena. No abdominal pain. Complains of weakness and dizziness. No chest pain or shortness of breath. He complains of chronic bilateral lower extremity edema.  No n/v. Takes ASA 81mg  daily. Denies NSAIDs otherwise.    Colonoscopy 2017: Prep excellent. Normal mucosa of cecum, ascending colon, hepatic flexure, transverse colonand splenic flexure. Scattered diverticula noted at descending and sigmoid  colon. Small polyp ablated via cold biopsy from proximal sigmoid colon. Tubular adenoma Fresh blood noted in the rectum and was traced to bleeding telangiectasia proximal to  dentate line. Small hemorrhoids noted below the dentate line. Next colonoscopy in 5 years  Colonoscopy 2014: Prep satisfactory. 8 mm polyp snared from the hepatic flexure. Tubular adenoma Over 20 mm lobulated broad-based polyp snared from hepatic flexure. 2 hemoclips applied to polypectomy site. Polyp retrieved using a Roth net. Tubular adenoma Scattered diverticula at sigmoid colon. Multiple telangiectasia at distal rectum. Small hemorrhoids below the dentate line.  Next colonoscopy 3 years.   No prior EGD   Prior to Admission medications   Medication Sig Start Date End Date Taking? Authorizing Provider  amiodarone  (PACERONE ) 200 MG tablet Take 200 mg once daily except do not take on Sundays. Patient taking differently: Take 200 mg by mouth daily. Take 200 mg once daily except do not take on Sundays. 02/06/24  Yes Strader, [place] M, PA-C  amLODipine  (NORVASC ) 10 MG tablet TAKE ONE TABLET BY MOUTH ONCE DAILY. 11/24/23  Yes Antonetta Rollene BRAVO, MD  aspirin  EC 81 MG tablet Take 1 tablet (81 mg total) by mouth daily. 12/20/13  Yes Antonetta Rollene BRAVO, MD  cetirizine  (ZYRTEC ) 10 MG tablet TAKE 1 TABLET EVERY DAY Patient taking differently: Take 10 mg by mouth daily. 08/19/22  Yes Antonetta Rollene BRAVO, MD  dapagliflozin  propanediol (FARXIGA ) 10 MG TABS tablet Take 1 tablet (10 mg total) by mouth daily before breakfast. 04/13/24  Yes Antonetta Rollene BRAVO, MD  EPINEPHrine  0.3 mg/0.3 mL IJ SOAJ injection Inject 0.3 mLs (0.3 mg total) into the muscle as needed for anaphylaxis. 08/04/19  Yes  Antonetta Rollene BRAVO, MD  levothyroxine  (SYNTHROID ) 100 MCG tablet Take 100 mcg by mouth daily. 05/22/24  Yes [provider]  levothyroxine  (SYNTHROID ) 75 MCG tablet Take 1 tablet (75 mcg total) by mouth daily. 05/24/24  Yes Antonetta Rollene BRAVO, MD  losartan  (COZAAR ) 50 MG tablet Take 1 tablet (50 mg total) by mouth daily. 07/30/23  Yes Kate Lonni CROME, MD  lovastatin  (MEVACOR ) 20 MG tablet Take 20 mg by mouth at bedtime. 02/02/24  Yes [provider]  metoprolol  succinate (TOPROL -XL) 50 MG 24 hr tablet Take 1 tablet (50 mg total) by mouth daily. 01/13/24  Yes Antonetta Rollene BRAVO, MD  mirtazapine  (REMERON ) 7.5 MG tablet Take 1 tablet (7.5 mg total) by mouth at bedtime. Patient taking differently: Take 3.75 mg by mouth at bedtime. 04/13/24  Yes Antonetta Rollene BRAVO, MD  Multiple Vitamin (MULTIVITAMIN WITH MINERALS) TABS tablet Take 1 tablet by mouth daily.   Yes [provider]  spironolactone  (ALDACTONE ) 25 MG tablet Take 25 mg by mouth daily as needed (swelling). 03/05/24  Yes [provider]  tadalafil  (CIALIS ) 5 MG tablet TAKE ONE TABLET BY MOUTH ONCE DAILY. 07/03/23  Yes Antonetta Rollene BRAVO, MD  tamsulosin  (FLOMAX ) 0.4 MG CAPS capsule Take 1 capsule (0.4 mg total) by mouth daily. 01/13/24  Yes Antonetta Rollene BRAVO, MD    Current Facility-Administered Medications  Medication Dose Route Frequency Provider Last Rate Last Admin   acetaminophen  (TYLENOL ) tablet 650 mg  650 mg Oral Q6H PRN Emokpae, Ejiroghene E, MD       Or   acetaminophen  (TYLENOL ) suppository 650 mg  650 mg Rectal Q6H PRN Emokpae, Ejiroghene E, MD       amiodarone  (PACERONE ) tablet 200 mg  200 mg Oral Daily Emokpae, Ejiroghene E, MD       lactated ringers  infusion   Intravenous Continuous Emokpae, Ejiroghene E, MD 50 mL/hr at 06/02/24 2102 1,000 mL at 06/02/24 2102   levothyroxine  (SYNTHROID ) tablet 175 mcg  175 mcg Oral Q0600 Emokpae, Ejiroghene E, MD       loratadine  (CLARITIN ) tablet 10 mg  10 mg Oral Daily Emokpae, Ejiroghene E, MD       mirtazapine  (REMERON ) tablet 3.75 mg  3.75 mg Oral QHS Emokpae, Ejiroghene E, MD       pantoprazole  (PROTONIX ) injection 40 mg  40 mg Intravenous Q24H Emokpae, Ejiroghene E, MD        polyethylene glycol (MIRALAX  / GLYCOLAX ) packet 17 g  17 g Oral Daily PRN Emokpae, Ejiroghene E, MD       Current Outpatient Medications  Medication Sig Dispense Refill   amiodarone  (PACERONE ) 200 MG tablet Take 200 mg once daily except do not take on Sundays. (Patient taking differently: Take 200 mg by mouth daily. Take 200 mg once daily except do not take on Sundays.) 180 tablet 3   amLODipine  (NORVASC ) 10 MG tablet TAKE ONE TABLET BY MOUTH ONCE DAILY. 90 tablet 0   aspirin  EC 81 MG tablet Take 1 tablet (81 mg total) by mouth daily. 30 tablet 5   cetirizine  (ZYRTEC ) 10 MG tablet TAKE 1 TABLET EVERY DAY (Patient taking differently: Take 10 mg by mouth daily.) 90 tablet 10   dapagliflozin  propanediol (FARXIGA ) 10 MG TABS tablet Take 1 tablet (10 mg total) by mouth daily before breakfast.     EPINEPHrine  0.3 mg/0.3 mL IJ SOAJ injection Inject 0.3 mLs (0.3 mg total) into the muscle as needed for anaphylaxis. 1 each 2   levothyroxine  (SYNTHROID ) 100  MCG tablet Take 100 mcg by mouth daily.     levothyroxine  (SYNTHROID ) 75 MCG tablet Take 1 tablet (75 mcg total) by mouth daily. 90 tablet 3   losartan  (COZAAR ) 50 MG tablet Take 1 tablet (50 mg total) by mouth daily. 90 tablet 3   lovastatin  (MEVACOR ) 20 MG tablet Take 20 mg by mouth at bedtime.     metoprolol  succinate (TOPROL -XL) 50 MG 24 hr tablet Take 1 tablet (50 mg total) by mouth daily. 90 tablet 1   mirtazapine  (REMERON ) 7.5 MG tablet Take 1 tablet (7.5 mg total) by mouth at bedtime. (Patient taking differently: Take 3.75 mg by mouth at bedtime.) 30 tablet 3   Multiple Vitamin (MULTIVITAMIN WITH MINERALS) TABS tablet Take 1 tablet by mouth daily.     spironolactone  (ALDACTONE ) 25 MG tablet Take 25 mg by mouth daily as needed (swelling).     tadalafil  (CIALIS ) 5 MG tablet TAKE ONE TABLET BY MOUTH ONCE DAILY. 30 tablet 11   tamsulosin  (FLOMAX ) 0.4 MG CAPS capsule Take 1 capsule (0.4 mg total) by mouth daily. 30 capsule 5    Allergies as of  06/02/2024 - Review Complete 06/02/2024  Allergen Reaction Noted   Peanut-containing drug products Shortness Of Breath and Swelling 07/25/2010   Egg-derived products Swelling    Iodine  Swelling 07/15/2011   Penicillins Hives, Rash, and Other (See Comments) 09/14/2020   Shellfish allergy Swelling 07/15/2011    Past Medical History:  Diagnosis Date   Allergic rhinitis, seasonal    BPH (benign prostatic hypertrophy)    Erectile dysfunction    Heart failure with improved ejection fraction (HFimpEF) (HCC)    a. EF 25-30% by echo in 04/2022 with cath showing normal cors b. EF at 30-35% in 10/2022 c. EF 40-45% in 06/2023 and cardiomyopathy felt to be due to PVC's   History of kidney stones    Hx of thyroidectomy    Hyperlipidemia    Hypertension    Hypothyroidism    Pre-diabetes    Prostate cancer (HCC) 2011   treated with radiation   PVC's (premature ventricular contractions)    Urosepsis 07/2011   ICU admission [facility]    Past Surgical History:  Procedure Laterality Date   COLONOSCOPY N/A 09/03/2013   Procedure: COLONOSCOPY;  Surgeon: Claudis RAYMOND Rivet, MD;  Location: [facility];  Service: Endoscopy;  Laterality: N/A;  1225   COLONOSCOPY N/A 11/08/2015   Procedure: COLONOSCOPY;  Surgeon: Claudis RAYMOND Rivet, MD;  Location: [facility];  Service: Endoscopy;  Laterality: N/A;  1200   MASS EXCISION N/A 08/15/2017   Procedure: EXCISION SEBACEOUS CYST NECK;  Surgeon: Mavis Anes, MD;  Location: [facility] ORS;  Service: General;  Laterality: N/A;   PROSTATE SURGERY     RIGHT/LEFT HEART CATH AND CORONARY ANGIOGRAPHY N/A 04/30/2022   Procedure: RIGHT/LEFT HEART CATH AND CORONARY ANGIOGRAPHY;  Surgeon: [place], Peter M, MD;  Location: [facility] INVASIVE CV LAB;  Service: Cardiovascular;  Laterality: N/A;   THYROIDECTOMY  2003   transurethral resection of thr prostate  2003   ureteral stone extraction  08/2011    Family History  Problem Relation Age of Onset   Stroke Mother    Diabetes Father     Hypertension Father    Kidney disease Brother    Stroke Brother    Diabetes Brother    Gout Brother    Diabetes Brother     Social History   Socioeconomic History   Marital status: Married    Spouse name: Not  on file   Number of children: 5   Years of education: Not on file   Highest education level: Not on file  Occupational History   Occupation: retired  Tobacco Use   Smoking status: Never    Passive exposure: Never   Smokeless tobacco: Never  Vaping Use   Vaping status: Never Used  Substance and Sexual Activity   Alcohol use: No    Alcohol/week: 0.0 standard drinks of alcohol   Drug use: No   Sexual activity: Yes  Other Topics Concern   Not on file  Social History Narrative   Not on file   Social Drivers of Health   Financial Resource Strain: Low Risk  (07/30/2023)   Overall Financial Resource Strain (CARDIA)    Difficulty of Paying Living Expenses: Not very hard  Food Insecurity: No Food Insecurity (07/30/2023)   Hunger Vital Sign    Worried About Running Out of Food in the Last Year: Never true    Ran Out of Food in the Last Year: Never true  Transportation Needs: No Transportation Needs (07/30/2023)   PRAPARE - Administrator, Civil Service (Medical): No    Lack of Transportation (Non-Medical): No  Physical Activity: Insufficiently Active (07/30/2023)   Exercise Vital Sign    Days of Exercise per Week: 3 days    Minutes of Exercise per Session: 20 min  Stress: No Stress Concern Present (07/30/2023)   [employer] of Occupational Health - Occupational Stress Questionnaire    Feeling of Stress : Not at all  Social Connections: Moderately Integrated (07/30/2023)   Social Connection and Isolation Panel    Frequency of Communication with Friends and Family: More than three times a week    Frequency of Social Gatherings with Friends and Family: More than three times a week    Attends Religious Services: More than 4 times per year    Active  Member of [employer] or Organizations: No    Attends Banker Meetings: Never    Marital Status: Married  Catering manager Violence: Not At Risk (07/30/2023)   Humiliation, Afraid, Rape, and Kick questionnaire    Fear of Current or Ex-Partner: No    Emotionally Abused: No    Physically Abused: No    Sexually Abused: No     Review of System:   General: Negative for anorexia, +weight loss,no fever, chills, fatigue, +weakness. Eyes: Negative for vision changes.  ENT: Negative for hoarseness, difficulty swallowing , nasal congestion. CV: Negative for chest pain, angina, palpitations, dyspnea on exertion, +peripheral edema.  Respiratory: Negative for dyspnea at rest, dyspnea on exertion, cough, sputum, wheezing.  GI: See history of present illness. GU:  Negative for dysuria, hematuria, urinary incontinence, urinary frequency, nocturnal urination.  MS: Negative for joint pain, low back pain.  Derm: Negative for rash or itching.  Neuro: Negative for weakness, abnormal sensation, seizure, frequent headaches, memory loss, confusion.  Psych: Negative for anxiety, depression, suicidal ideation, hallucinations.  Endo: see pi Heme: Negative for bruising or bleeding. Allergy: Negative for rash or hives.      Physical Examination:   Vital signs in last 24 hours: Temp:  [97.9 F (36.6 C)-98.7 F (37.1 C)] 98.7 F (37.1 C) (08/21 0300) Pulse Rate:  [55-76] 64 (08/21 0400) Resp:  [11-19] 12 (08/21 0400) BP: (115-151)/(73-95) 142/88 (08/21 0400) SpO2:  [97 %-100 %] 100 % (08/21 0400) Last BM Date : 06/03/24  General: Well-nourished, well-developed in no acute distress.  Head: Normocephalic, atraumatic.  Eyes: Conjunctiva pale, no icterus. Mouth: Oropharyngeal mucosa moist and pink , no lesions erythema or exudate. Neck: Supple without thyromegaly, masses, or lymphadenopathy.  Lungs: Clear to auscultation bilaterally.  Heart: Regular rate and rhythm, no murmurs rubs or gallops.   Abdomen: Bowel sounds are normal, nontender, nondistended, no hepatosplenomegaly or masses, no abdominal bruits or hernia , no rebound or guarding.   Rectal: not performed Extremities: 1+bilateral lower extremity edema, clubbing, deformity.  Neuro: Alert and oriented x 4 , grossly normal neurologically.  Skin: Warm and dry, no rash or jaundice.   Psych: Alert and cooperative, normal mood and affect.        Intake/Output from previous day: No intake/output data recorded. Intake/Output this shift: No intake/output data recorded.  Lab Results:   CBC Recent Labs    06/02/24 1353 06/02/24 1933 06/03/24 0237 06/03/24 0958  WBC 5.9  --  5.2 4.7  HGB 11.1* 9.1* 8.3* 8.7*  HCT 36.2* 29.7* 27.5* 28.3*  MCV 84.6  --  85.4 84.5  PLT 192  --  162 176   BMET Recent Labs    06/02/24 1353  NA 143  K 3.8  CL 107  CO2 24  GLUCOSE 102*  BUN 17  CREATININE 1.39*  CALCIUM 8.6*   LFT Recent Labs    06/02/24 1353  BILITOT 0.9  ALKPHOS 59  AST 18  ALT 11  PROT 6.5  ALBUMIN 3.5    Lipase No results for input(s): LIPASE in the last 72 hours.  PT/INR No results for input(s): LABPROT, INR in the last 72 hours.   Hepatitis Panel No results for input(s): HEPBSAG, HCVAB, HEPAIGM, HEPBIGM in the last 72 hours.   Imaging Studies:   CT ABDOMEN PELVIS W CONTRAST Result Date: 05/18/2024 CLINICAL DATA:  Unintentional weight loss. EXAM: CT ABDOMEN AND PELVIS WITH CONTRAST TECHNIQUE: Multidetector CT imaging of the abdomen and pelvis was performed using the standard protocol following bolus administration of intravenous contrast. RADIATION DOSE REDUCTION: This exam was performed according to the departmental dose-optimization program which includes automated exposure control, adjustment of the mA and/or kV according to patient size and/or use of iterative reconstruction technique. CONTRAST:  OMNIPAQUE  IOHEXOL  300 MG/ML  SOLN COMPARISON:  07/18/2011 FINDINGS: Lower  Chest: No acute findings. Hepatobiliary: No suspicious hepatic masses identified. Multiple tiny appendix cysts are again noted. Tiny gallstones again seen, without findings of cholecystitis or biliary ductal dilatation. Pancreas:  No mass or inflammatory changes. Spleen: Within normal limits in size and appearance. Adrenals/Urinary Tract: Multiple benign-appearing left renal cysts are again seen, largest measuring approximately 15 cm. Several small benign-appearing right renal cysts are again noted. (No followup imaging is recommended). No suspicious masses identified. No evidence of ureteral calculi or hydronephrosis. Mild diffuse bladder wall thickening again seen, presumably due to chronic bladder outlet obstruction given enlarged prostate. Stomach/Bowel: No evidence of obstruction, inflammatory process or abnormal fluid collections. Diverticulosis is seen mainly involving the sigmoid colon, however there is no evidence of diverticulitis. Vascular/Lymphatic: No pathologically enlarged lymph nodes. No acute vascular findings. Reproductive: Stable mildly enlarged prostate, with prior TURP defect. Several fiducial markers again noted. Other:  None. Musculoskeletal: No suspicious bone lesions identified. Old compression fracture deformity of L3 vertebral body noted. IMPRESSION: No acute findings. No evidence of malignancy. Stable mildly enlarged prostate, with prior TURP defect, and findings of chronic bladder obstruction. Cholelithiasis. No radiographic evidence of cholecystitis. Colonic diverticulosis, without radiographic evidence of diverticulitis. Electronically Signed   By: Norleen DELENA Graham CHRISTELLA.D.  On: 05/18/2024 14:14  [4 week]  Assessment:   83y/o male with PMH of PVCs, prostate Ca, Hypothyroidism s/p thyroidectomy, HTN, HLD, HFimpEF, BPH, kidney stones presenting to the ED with multiple episodes of large volume rectal bleeding with symptomatic anemia.   Acute onset rectal bleeding with recent IDA/weight  loss: acute onset, multiple episodes, dropping Hgb 11.1-8.7 in less than 24 hours. Recent evaluation for weight loss, new iron deficiency as outlined with plans for colonoscopy/EGD. Ddx broad: diverticular bleeding, PUD with rapid transit ugi bleeding less likely given hemodynamic stability, malignancy, Dieulafoy lesion. Recommend inpatient colonoscopy and upper endoscopy.   Plan:   Serial H/H, transfuse as needed. He will likely need one unit of prbcs given ongoing GI bleeding. Anticipate colonoscopy/EGD for tomorrow.  Await ECHO.  Clear liquid diet today. IV pantoprazole     LOS: 0 days   We would like to thank you for the opportunity to participate in the care of [name].  [name]. [name] [facility] [phone] 8/21/20256:29 AM

## 2024-06-03 NOTE — Care Management Obs Status (Signed)
 MEDICARE OBSERVATION STATUS NOTIFICATION   Patient Details  Name: [name] MRN: [id number] Date of Birth: 1941-01-11   Medicare Observation Status Notification Given:  Yes    Noreen KATHEE Pinal, LCSWA 06/03/2024, 2:05 PM

## 2024-06-03 NOTE — Progress Notes (Addendum)
 PROGRESS NOTE   [name]  FMW:[id number]    DOB: 11-Oct-1941    DOA: 06/02/2024  PCP: Antonetta Rollene BRAVO, MD   I have briefly reviewed patients previous medical records in [facility].   Brief Hospital Course:  83 year old male with PMH of chronic systolic CHF (EF 59-54% in 06/2023), non ischemic cardiomyopathy felt to be due to PVCs, post thyroidectomy hypothyroidism, HLD, HTN, prediabetes, prostate cancer s/p XRT, BPH, presented to the ED on 06/02/2024 with abrupt onset of multiple episodes of painless bright red rectal bleeding.  No nausea, vomiting, abdominal pain or dark stools.  Rarely takes NSAIDs.  He is on aspirin  81 mg daily chronically.  Also reported feeling dizzy when he got up but did not pass out.  Admitted for acute lower GI bleeding/rectal bleeding, acute blood loss anemia related to same and presyncope.  GI consulted and input pending.   Assessment & Plan:   Rectal bleeding/acute lower GI bleeding Suspect diverticular versus hemorrhoidal. CT A/P 8/5: No acute findings.  No evidence of malignancy.  Colonic diverticulosis. Hold aspirin  81 mg daily pending GI follow-up and recommendations. Per report patient had colonoscopy planned for 8/26 by Dr. Cinderella.  GI/Dr. Shaaron consulted by EDP and await input. Last colonoscopy 2016 but unable to see report Continue n.p.o., IV PPI (although do not seem this is necessary) and gentle IV fluids Based on patient's report and recent BM, appears that GI bleed is slowing down or has stopped. GI input appreciated, plan EGD and colonoscopy 8/22.  Echo results as noted below.  Defer to GI if they wish to consider cardiology clearance preprocedure.  Acute posthemorrhagic anemia Hemoglobin 14.3 on 04/22/2023, 12.2 on 04/13/2024.  Presented to ED on 8/20 with hemoglobin of 11.1 which has drifted down to 8.3.  Hemoglobin up to 8.7 this afternoon. Secondary to GI bleeding Follow CBC Q8 hourly and transfuse for hemoglobin 7 g or less.   Patient consents to blood transfusion if indicated  Presyncope Secondary to acute blood loss anemia and vasovagal Sinus rhythm on telemetry. Last echo 9/24 showed LVEF of 40-45% and grade 1 diastolic dysfunction.  Serial troponins negative, ruled out for ACS. Orthostatic vital signs negative. Echo results as noted below.  Chronic systolic CHF/NICM/frequent PVCs Compensated.  Holding Farxiga  and spironolactone .  Not on diuretics PTA. Repeat echo shows LVEF 45%, unchanged compared to prior.  Frequent PVCs Last saw cardiology in office on 02/06/2024, recommendation was to continue amiodarone  200 mg once daily.  Essential hypertension Controlled.  Orthostatic vitals negative. In the context of acute GI bleed, holding amlodipine , losartan  and Toprol -XL  Hyperlipidemia Continue home dose of statins  Post thyroidectomy hypothyroidism Continue Synthroid .  Prolonged QTc EKG 06/02/2024 personally reviewed: Sinus rhythm, QTc 529 ms.  No acute findings. Attempt to keep K >4 and magnesium  >2.  Avoid QT prolonging medications. Follow EKG in AM.  CKD stage IIIa Baseline creatinine probably in the 1.4-1.5 range Creatinine at baseline/1.39.  Avoid nephrotoxics.  BPH/history of prostate cancer Continue home dose of Flomax .  There is no height or weight on file to calculate BMI.   DVT prophylaxis: SCDs Start: 06/03/24 0217     Code Status: Full Code:  Family Communication: None at bedside. Disposition:  Status is: Observation The patient remains OBS appropriate and will d/c before 2 midnights.     Consultants:   GI.  Input pending.  Procedures:     Subjective:  Seen this morning while still in ED.  States that he had  a BM around 7 AM which had specks of brown stool and almost no blood.  Denies dizziness, lightheadedness, feeling like passing out, chest pain or dyspnea.  Reports that he has not had similar GI bleeds in the past.  Indicates that symptoms abruptly started while he  was mowing the lawn yesterday afternoon, felt like he had to use the bathroom and then had large bright red bloody BM without clots and multiple episodes thereafter.  Objective:   Vitals:   06/03/24 0300 06/03/24 0400 06/03/24 0500 06/03/24 0600  BP: 135/84 (!) 142/88 129/83 123/76  Pulse: 63 64 60 (!) 57  Resp: 12 12 12 10   Temp: 98.7 F (37.1 C)     TempSrc: Oral     SpO2: 100% 100% 100% 99%    General exam: Elderly male, moderately built and nourished lying comfortably propped up in bed without distress. Respiratory system: Clear to auscultation. Respiratory effort normal.  Midline sternotomy scar. Cardiovascular system: S1 & S2 heard, RRR. No JVD, murmurs, rubs, gallops or clicks. No pedal edema.  Telemetry personally reviewed at bedside: Sinus rhythm. Gastrointestinal system: Abdomen is nondistended, soft and nontender. No organomegaly or masses felt. Normal bowel sounds heard. Central nervous system: Alert and oriented. No focal neurological deficits. Extremities: Symmetric 5 x 5 power. Skin: No rashes, lesions or ulcers Psychiatry: Judgement and insight appear normal. Mood & affect appropriate.     Data Reviewed:   I have personally reviewed following labs and imaging studies   CBC: Recent Labs  Lab 06/02/24 1353 06/02/24 1933 06/03/24 0237  WBC 5.9  --  5.2  HGB 11.1* 9.1* 8.3*  HCT 36.2* 29.7* 27.5*  MCV 84.6  --  85.4  PLT 192  --  162    Basic Metabolic Panel: Recent Labs  Lab 06/02/24 1353 06/02/24 1825  NA 143  --   K 3.8  --   CL 107  --   CO2 24  --   GLUCOSE 102*  --   BUN 17  --   CREATININE 1.39*  --   CALCIUM 8.6*  --   MG  --  2.2    Liver Function Tests: Recent Labs  Lab 06/02/24 1353  AST 18  ALT 11  ALKPHOS 59  BILITOT 0.9  PROT 6.5  ALBUMIN 3.5    CBG: No results for input(s): GLUCAP in the last 168 hours.  Microbiology Studies:  No results found for this or any previous visit (from the past 240 hours).  Radiology  Studies:  No results found.  Scheduled Meds:    amiodarone   200 mg Oral Daily   levothyroxine   175 mcg Oral Q0600   loratadine   10 mg Oral Daily   mirtazapine   3.75 mg Oral QHS   pantoprazole  (PROTONIX ) IV  40 mg Intravenous Q24H    Continuous Infusions:     LOS: 0 days     Trenda Mar, MD,  FACP, [facility], [facility], [facility]   [facility] Advisor [place]      To contact the attending provider between 7A-7P or the covering provider during after hours 7P-7A, please log into the web site [email or web address] and access using universal [place] password for that web site. If you do not have the password, please call the hospital operator.  06/03/2024, 9:12 AM

## 2024-06-03 NOTE — TOC CM/SW Note (Signed)
 Transition of Care [facility]) - Inpatient Brief Assessment   Patient Details  Name: [name] MRN: [id number] Date of Birth: 11/13/1940  Transition of Care [facility]) CM/SW Contact:    Noreen KATHEE Pinal, LCSWA Phone Number: 06/03/2024, 10:01 AM   Clinical Narrative:   Transition of Care Department [facility]) has reviewed patient and no TOC needs have been identified at this time. We will continue to monitor patient advancement through interdisciplinary progression rounds. If new patient transition needs arise, please place a TOC consult.  Transition of Care Asessment: Insurance and Status: Insurance coverage has been reviewed Patient has primary care physician: Yes Home environment has been reviewed: Single Family Home Prior level of function:: Independent Prior/Current Home Services: No current home services Social Drivers of Health Review: SDOH reviewed no interventions necessary Readmission risk has been reviewed: Yes Transition of care needs: no transition of care needs at this time

## 2024-06-03 NOTE — Plan of Care (Signed)
   Problem: Education: Goal: Knowledge of General Education information will improve Description: Including pain rating scale, medication(s)/side effects and non-pharmacologic comfort measures Outcome: Progressing   Problem: Clinical Measurements: Goal: Ability to maintain clinical measurements within normal limits will improve Outcome: Progressing Goal: Diagnostic test results will improve Outcome: Progressing

## 2024-06-03 NOTE — Progress Notes (Signed)
*  PRELIMINARY RESULTS* Echocardiogram 2D Echocardiogram has been performed.  [name] 06/03/2024, 11:54 AM

## 2024-06-04 ENCOUNTER — Encounter (HOSPITAL_COMMUNITY): Payer: Self-pay | Admitting: Internal Medicine

## 2024-06-04 ENCOUNTER — Observation Stay (HOSPITAL_COMMUNITY): Admitting: Anesthesiology

## 2024-06-04 ENCOUNTER — Encounter (HOSPITAL_COMMUNITY)
Admission: EM | Disposition: A | Discharge status: Home / Self Care | Payer: Self-pay | Source: Home / Self Care | Attending: Internal Medicine

## 2024-06-04 ENCOUNTER — Other Ambulatory Visit: Payer: Self-pay

## 2024-06-04 DIAGNOSIS — K625 Hemorrhage of anus and rectum: Secondary | ICD-10-CM

## 2024-06-04 DIAGNOSIS — I110 Hypertensive heart disease with heart failure: Secondary | ICD-10-CM | POA: Diagnosis not present

## 2024-06-04 DIAGNOSIS — I428 Other cardiomyopathies: Secondary | ICD-10-CM | POA: Diagnosis not present

## 2024-06-04 DIAGNOSIS — I5022 Chronic systolic (congestive) heart failure: Secondary | ICD-10-CM

## 2024-06-04 DIAGNOSIS — R7303 Prediabetes: Secondary | ICD-10-CM | POA: Diagnosis not present

## 2024-06-04 DIAGNOSIS — K648 Other hemorrhoids: Secondary | ICD-10-CM

## 2024-06-04 DIAGNOSIS — E039 Hypothyroidism, unspecified: Secondary | ICD-10-CM | POA: Diagnosis not present

## 2024-06-04 DIAGNOSIS — K921 Melena: Secondary | ICD-10-CM | POA: Diagnosis not present

## 2024-06-04 DIAGNOSIS — R9431 Abnormal electrocardiogram [ECG] [EKG]: Secondary | ICD-10-CM

## 2024-06-04 DIAGNOSIS — D62 Acute posthemorrhagic anemia: Secondary | ICD-10-CM | POA: Diagnosis not present

## 2024-06-04 DIAGNOSIS — Z923 Personal history of irradiation: Secondary | ICD-10-CM | POA: Diagnosis not present

## 2024-06-04 DIAGNOSIS — Z833 Family history of diabetes mellitus: Secondary | ICD-10-CM | POA: Diagnosis not present

## 2024-06-04 DIAGNOSIS — Z8249 Family history of ischemic heart disease and other diseases of the circulatory system: Secondary | ICD-10-CM | POA: Diagnosis not present

## 2024-06-04 DIAGNOSIS — Z79899 Other long term (current) drug therapy: Secondary | ICD-10-CM | POA: Diagnosis not present

## 2024-06-04 DIAGNOSIS — E890 Postprocedural hypothyroidism: Secondary | ICD-10-CM | POA: Diagnosis not present

## 2024-06-04 DIAGNOSIS — E785 Hyperlipidemia, unspecified: Secondary | ICD-10-CM | POA: Diagnosis not present

## 2024-06-04 DIAGNOSIS — D500 Iron deficiency anemia secondary to blood loss (chronic): Secondary | ICD-10-CM | POA: Diagnosis not present

## 2024-06-04 DIAGNOSIS — Z8546 Personal history of malignant neoplasm of prostate: Secondary | ICD-10-CM | POA: Diagnosis not present

## 2024-06-04 DIAGNOSIS — K644 Residual hemorrhoidal skin tags: Secondary | ICD-10-CM

## 2024-06-04 DIAGNOSIS — Z79890 Hormone replacement therapy (postmenopausal): Secondary | ICD-10-CM | POA: Diagnosis not present

## 2024-06-04 DIAGNOSIS — D509 Iron deficiency anemia, unspecified: Secondary | ICD-10-CM | POA: Diagnosis not present

## 2024-06-04 DIAGNOSIS — R55 Syncope and collapse: Secondary | ICD-10-CM | POA: Diagnosis not present

## 2024-06-04 DIAGNOSIS — K5731 Diverticulosis of large intestine without perforation or abscess with bleeding: Principal | ICD-10-CM

## 2024-06-04 DIAGNOSIS — I130 Hypertensive heart and chronic kidney disease with heart failure and stage 1 through stage 4 chronic kidney disease, or unspecified chronic kidney disease: Secondary | ICD-10-CM | POA: Diagnosis not present

## 2024-06-04 DIAGNOSIS — D122 Benign neoplasm of ascending colon: Secondary | ICD-10-CM

## 2024-06-04 DIAGNOSIS — K449 Diaphragmatic hernia without obstruction or gangrene: Secondary | ICD-10-CM

## 2024-06-04 DIAGNOSIS — I493 Ventricular premature depolarization: Secondary | ICD-10-CM | POA: Diagnosis not present

## 2024-06-04 DIAGNOSIS — N1831 Chronic kidney disease, stage 3a: Secondary | ICD-10-CM | POA: Diagnosis not present

## 2024-06-04 DIAGNOSIS — N400 Enlarged prostate without lower urinary tract symptoms: Secondary | ICD-10-CM | POA: Diagnosis not present

## 2024-06-04 DIAGNOSIS — Z7982 Long term (current) use of aspirin: Secondary | ICD-10-CM | POA: Diagnosis not present

## 2024-06-04 HISTORY — PX: COLONOSCOPY: SHX5424

## 2024-06-04 HISTORY — PX: ESOPHAGOGASTRODUODENOSCOPY: SHX5428

## 2024-06-04 LAB — CBC
HCT: 22.4 % — ABNORMAL LOW (ref 39.0–52.0)
Hemoglobin: 7.2 g/dL — ABNORMAL LOW (ref 13.0–17.0)
MCH: 26.7 pg (ref 26.0–34.0)
MCHC: 32.1 g/dL (ref 30.0–36.0)
MCV: 83 fL (ref 80.0–100.0)
Platelets: 148 K/uL — ABNORMAL LOW (ref 150–400)
RBC: 2.7 MIL/uL — ABNORMAL LOW (ref 4.22–5.81)
RDW: 17.2 % — ABNORMAL HIGH (ref 11.5–15.5)
WBC: 4.7 K/uL (ref 4.0–10.5)
nRBC: 0 % (ref 0.0–0.2)

## 2024-06-04 LAB — BASIC METABOLIC PANEL WITH GFR
Anion gap: 7 (ref 5–15)
BUN: 12 mg/dL (ref 8–23)
CO2: 23 mmol/L (ref 22–32)
Calcium: 8 mg/dL — ABNORMAL LOW (ref 8.9–10.3)
Chloride: 107 mmol/L (ref 98–111)
Creatinine, Ser: 1.21 mg/dL (ref 0.61–1.24)
GFR, Estimated: 59 mL/min — ABNORMAL LOW (ref >60)
Glucose, Bld: 70 mg/dL (ref 70–99)
Potassium: 3.5 mmol/L (ref 3.5–5.1)
Sodium: 137 mmol/L (ref 135–145)

## 2024-06-04 LAB — MAGNESIUM: Magnesium: 1.8 mg/dL (ref 1.7–2.4)

## 2024-06-04 LAB — PREPARE RBC (CROSSMATCH)

## 2024-06-04 SURGERY — COLONOSCOPY
Anesthesia: Monitor Anesthesia Care

## 2024-06-04 MED ORDER — GLYCOPYRROLATE 0.2 MG/ML IJ SOLN
INTRAMUSCULAR | Status: DC | PRN
  Administered 2024-06-04: .1 mg via INTRAVENOUS

## 2024-06-04 MED ORDER — SODIUM CHLORIDE 0.9% IV SOLUTION: Freq: Once | INTRAVENOUS | Status: DC

## 2024-06-04 MED ORDER — PROPOFOL 10 MG/ML IV BOLUS
INTRAVENOUS | Status: DC | PRN
  Administered 2024-06-04: 100 mg via INTRAVENOUS
  Administered 2024-06-04 (×2): 50 mg via INTRAVENOUS

## 2024-06-04 MED ORDER — LACTATED RINGERS IV SOLN: INTRAVENOUS | Status: AC

## 2024-06-04 MED ORDER — POTASSIUM CHLORIDE CRYS ER 20 MEQ PO TBCR
40.0000 meq | EXTENDED_RELEASE_TABLET | Freq: Once | ORAL | Status: AC
  Administered 2024-06-04: 40 meq via ORAL
  Filled 2024-06-04: qty 2

## 2024-06-04 MED ORDER — GLYCOPYRROLATE PF 0.2 MG/ML IJ SOSY
PREFILLED_SYRINGE | INTRAMUSCULAR | Status: AC
  Filled 2024-06-04: qty 1

## 2024-06-04 MED ORDER — MAGNESIUM SULFATE 2 GM/50ML IV SOLN
2.0000 g | Freq: Once | INTRAVENOUS | Status: AC
  Administered 2024-06-04: 2 g via INTRAVENOUS
  Filled 2024-06-04: qty 50

## 2024-06-04 MED ORDER — LACTATED RINGERS IV SOLN: INTRAVENOUS | Status: DC

## 2024-06-04 MED ORDER — PHENYLEPHRINE HCL (PRESSORS) 10 MG/ML IV SOLN
INTRAVENOUS | Status: DC | PRN
  Administered 2024-06-04 (×8): 100 ug via INTRAVENOUS

## 2024-06-04 MED ORDER — PHENYLEPHRINE 80 MCG/ML (10ML) SYRINGE FOR IV PUSH (FOR BLOOD PRESSURE SUPPORT)
PREFILLED_SYRINGE | INTRAVENOUS | Status: AC
Start: 2024-06-04 — End: 2024-06-04
  Filled 2024-06-04: qty 10

## 2024-06-04 MED ORDER — PROPOFOL 10 MG/ML IV BOLUS
INTRAVENOUS | Status: AC
  Filled 2024-06-04: qty 20

## 2024-06-04 NOTE — Progress Notes (Signed)
 PROGRESS NOTE   [name]  FMW:[id number]    DOB: 12-Jul-1941    DOA: 06/02/2024  PCP: Antonetta Rollene BRAVO, MD   I have briefly reviewed patients previous medical records in [facility].   Brief Hospital Course:  83 year old male with PMH of chronic systolic CHF (EF 59-54% in 06/2023), non ischemic cardiomyopathy felt to be due to PVCs, post thyroidectomy hypothyroidism, HLD, HTN, prediabetes, prostate cancer s/p XRT, BPH, presented to the ED on 06/02/2024 with abrupt onset of multiple episodes of painless bright red rectal bleeding.  No nausea, vomiting, abdominal pain or dark stools.  Rarely takes NSAIDs.  He is on aspirin  81 mg daily chronically.  Also reported feeling dizzy when he got up but did not pass out.  Admitted for acute lower GI bleeding/rectal bleeding, acute blood loss anemia related to same and presyncope.  Transfusing PRBC for anemia.  GI consulted and plan EGD/colonoscopy on 8/22.   Assessment & Plan:   Rectal bleeding/acute lower GI bleeding Suspect diverticular versus hemorrhoidal. CT A/P 8/5: No acute findings.  No evidence of malignancy.  Colonic diverticulosis. Hold aspirin  81 mg daily pending GI follow-up and recommendations. Per report patient had colonoscopy planned for 8/26 by Dr. Cinderella.  GI/Dr. Shaaron consulted and plan EGD/colonoscopy on 8/22. Last colonoscopy 2016 but unable to see report Continue n.p.o., IV PPI (although do not seem this is necessary) and gentle IV fluids Bowel prep overnight without any further blood per patient and RN report. Clinically bleeding seems to have abated but had significant hemoglobin drop as noted below.  Follow GI recommendations postprocedure.  He is ASA 4.  Acute posthemorrhagic anemia Hemoglobin 14.3 on 04/22/2023, 12.2 on 04/13/2024.  Presented to ED on 8/20 with hemoglobin of 11.1 which has drifted down gradually to 7.2 on 8/22 Secondary to GI bleeding Patient symptomatic with weakness, presented with  presyncope.  Has not been out of bed much. Transfused 1 unit PRBC, patient consented.  Follow-up posttransfusion CBCs and attempt to keep hemoglobin closer to 8.  Presyncope Secondary to acute blood loss anemia and vasovagal Sinus rhythm on telemetry. Serial troponins negative, ruled out for ACS.  TTE shows LVEF 45%, LV global hypokinesis, grade 1 diastolic dysfunction and no aortic stenosis.  No significant change from prior echo. Orthostatic vital signs negative.  Chronic systolic CHF/NICM/frequent PVCs Compensated.  Holding Farxiga  and spironolactone .  Not on diuretics PTA. Repeat echo results as above.  Frequent PVCs Last saw cardiology in office on 02/06/2024, recommendation was to continue amiodarone  200 mg once daily. Sinus rhythm on telemetry.  Mild sinus tachycardia with activity.  No significant PVCs noted.  Essential hypertension Controlled.  Orthostatic vitals negative. In the context of acute GI bleed, holding amlodipine , losartan  and Toprol -XL.  Resume one at a time when able.  Hyperlipidemia Continue home dose of statins  Post thyroidectomy hypothyroidism Continue Synthroid .  Prolonged QTc EKG 06/02/2024 personally reviewed: Sinus rhythm, QTc 529 ms.  No acute findings. Attempt to keep K >4 and magnesium  >2.  Avoid QT prolonging medications. Follow EKG, ordered. Plan to replace K and magnesium  after he returns from endoscopies.  CKD stage IIIa Baseline creatinine probably in the 1.4-1.5 range Creatinine has improved to 1.21.  Avoid nephrotoxics.  BPH/history of prostate cancer Continue home dose of Flomax .  Body mass index is 21.3 kg/m.   DVT prophylaxis: SCDs Start: 06/03/24 0217     Code Status: Full Code:  Family Communication: None at bedside. Disposition:  Feel that he meets for  inpatient intensity.  Care has already crossed 2 midnights.  Will have [facility] team/physician advisor evaluate and okay from my standpoint to change to inpatient.      Consultants:   GI.    Procedures:     Subjective:  Seen this morning prior to procedure.  Reports feeling somewhat weak, drugged.  Denies chest pain, dyspnea, dizziness or lightheadedness.  Underwent bowel prep last night, returned was clear without blood or black-colored stools.  Objective:   Vitals:   06/03/24 1718 06/03/24 2050 06/04/24 0203 06/04/24 1027  BP: 117/70 112/75 110/66 (!) 144/87  Pulse:  85 72   Resp: 16 16  12   Temp: 97.7 F (36.5 C) 98.1 F (36.7 C) 98.7 F (37.1 C) 99 F (37.2 C)  TempSrc: Oral Oral Oral   SpO2:  98% 97% 100%  Weight:      Height:        General exam: Elderly male, moderately built and nourished and up comfortably at edge of bed without distress. Respiratory system: Clear to auscultation. Respiratory effort normal.  Midline sternotomy scar. Cardiovascular system: S1 & S2 heard, RRR. No JVD, murmurs, rubs, gallops or clicks. No pedal edema.  Telemetry personally reviewed: Sinus rhythm.  Mild sinus tachycardia mostly in the 110s and occasionally and transiently up to 140s with activity.  No PVCs. Gastrointestinal system: Abdomen is nondistended, soft and nontender. No organomegaly or masses felt. Normal bowel sounds heard. Central nervous system: Alert and oriented. No focal neurological deficits. Extremities: Symmetric 5 x 5 power. Skin: No rashes, lesions or ulcers Psychiatry: Judgement and insight appear normal. Mood & affect appropriate.     Data Reviewed:   I have personally reviewed following labs and imaging studies   CBC: Recent Labs  Lab 06/03/24 0958 06/03/24 1839 06/04/24 0403  WBC 4.7 6.1 4.7  HGB 8.7* 7.7* 7.2*  HCT 28.3* 25.2* 22.4*  MCV 84.5 84.6 83.0  PLT 176 168 148*    Basic Metabolic Panel: Recent Labs  Lab 06/02/24 1353 06/02/24 1825 06/04/24 0403  NA 143  --  137  K 3.8  --  3.5  CL 107  --  107  CO2 24  --  23  GLUCOSE 102*  --  70  BUN 17  --  12  CREATININE 1.39*  --  1.21  CALCIUM  8.6*  --  8.0*  MG  --  2.2 1.8    Liver Function Tests: Recent Labs  Lab 06/02/24 1353  AST 18  ALT 11  ALKPHOS 59  BILITOT 0.9  PROT 6.5  ALBUMIN 3.5    CBG: No results for input(s): GLUCAP in the last 168 hours.  Microbiology Studies:  No results found for this or any previous visit (from the past 240 hours).  Radiology Studies:  ECHOCARDIOGRAM COMPLETE Result Date: 06/03/2024    ECHOCARDIOGRAM REPORT   Patient Name:   LAMICHAEL YOUKHANA [facility] Date of Exam: 06/03/2024 Medical Rec #:  [id number]            Height:       67.5 in Accession #:    [id number]           Weight:       139.6 lb Date of Birth:  [birth year]             BSA:          1.745 m Patient Age:    34 years  BP:           127/95 mmHg Patient Gender: M                    HR:           69 bpm. Exam Location:  [facility] Procedure: 2D Echo, 3D Echo, Cardiac Doppler, Color Doppler and Strain Analysis            (Both Spectral and Color Flow Doppler were utilized during            procedure). Indications:    Syncope R55  History:        Patient has prior history of Echocardiogram examinations, most                 recent 06/19/2023. CHF and Cardiomyopathy, Arrythmias:PVC; Risk                 Factors:Dyslipidemia, Hypertension, Prediabetes and Non-Smoker.  Sonographer:    Aida Pizza RCS Referring Phys: [phone] Elige Shouse D Sary Bogie  Sonographer Comments: Global longitudinal strain was attempted. IMPRESSIONS  1. Left ventricular ejection fraction, by estimation, is 45%. Left ventricular ejection fraction by 3D volume is 44 %. The left ventricle has mildly decreased function. The left ventricle demonstrates global hypokinesis. Left ventricular diastolic parameters are consistent with Grade I diastolic dysfunction (impaired relaxation). The average left ventricular global longitudinal strain is -13.4 %. The global longitudinal strain is abnormal.  2. Right ventricular systolic function is normal. The right ventricular size is normal.  There is normal pulmonary artery systolic pressure.  3. Right atrial size was moderately dilated.  4. The mitral valve is normal in structure. Mild mitral valve regurgitation. No evidence of mitral stenosis.  5. The aortic valve is tricuspid. Aortic valve regurgitation is mild. No aortic stenosis is present.  6. Pulmonic valve regurgitation is moderate.  7. The inferior vena cava is normal in size with greater than 50% respiratory variability, suggesting right atrial pressure of 3 mmHg. Comparison(s): No significant change from prior study. FINDINGS  Left Ventricle: Left ventricular ejection fraction, by estimation, is 45%. Left ventricular ejection fraction by 3D volume is 44 %. The left ventricle has mildly decreased function. The left ventricle demonstrates global hypokinesis. The average left ventricular global longitudinal strain is -13.4 %. Strain was performed and the global longitudinal strain is abnormal. The left ventricular internal cavity size was normal in size. There is no left ventricular hypertrophy. Left ventricular diastolic parameters are consistent with Grade I diastolic dysfunction (impaired relaxation). Normal left ventricular filling pressure. Right Ventricle: The right ventricular size is normal. No increase in right ventricular wall thickness. Right ventricular systolic function is normal. There is normal pulmonary artery systolic pressure. The tricuspid regurgitant velocity is 2.40 m/s, and  with an assumed right atrial pressure of 3 mmHg, the estimated right ventricular systolic pressure is 26.0 mmHg. Left Atrium: Left atrial size was normal in size. Right Atrium: Right atrial size was moderately dilated. Pericardium: There is no evidence of pericardial effusion. Mitral Valve: The mitral valve is normal in structure. Mild mitral valve regurgitation. No evidence of mitral valve stenosis. Tricuspid Valve: The tricuspid valve is normal in structure. Tricuspid valve regurgitation is mild . No  evidence of tricuspid stenosis. Aortic Valve: The aortic valve is tricuspid. Aortic valve regurgitation is mild. No aortic stenosis is present. Pulmonic Valve: The pulmonic valve was not well visualized. Pulmonic valve regurgitation is moderate. No evidence of pulmonic stenosis. Aorta: The aortic root  is normal in size and structure. Venous: The inferior vena cava is normal in size with greater than 50% respiratory variability, suggesting right atrial pressure of 3 mmHg. IAS/Shunts: No atrial level shunt detected by color flow Doppler. Additional Comments: 3D was performed not requiring image post processing on an independent workstation and was abnormal.  LEFT VENTRICLE PLAX 2D LVIDd:         5.30 cm         Diastology LVIDs:         3.90 cm         LV e' medial:    4.35 cm/s LV PW:         1.10 cm         LV E/e' medial:  13.2 LV IVS:        1.00 cm         LV e' lateral:   5.84 cm/s LVOT diam:     1.90 cm         LV E/e' lateral: 9.8 LV SV:         55 LV SV Index:   32              2D Longitudinal LVOT Area:     2.84 cm        Strain                                2D Strain GLS   -13.4 %                                Avg: LV Volumes (MOD) LV vol d, MOD    112.5 ml      3D Volume EF A2C:                           LV 3D EF:    Left LV vol d, MOD    117.0 ml                   ventricul A4C:                                        ar LV vol s, MOD    58.3 ml                    ejection A2C:                                        fraction LV vol s, MOD    58.2 ml                    by 3D A4C:                                        volume is LV SV MOD A2C:   54.2 ml                    44 %. LV SV MOD A4C:   117.0 ml LV SV  MOD BP:    53.7 ml                                3D Volume EF:                                3D EF:        44 %                                LV EDV:       127 ml                                LV ESV:       71 ml                                LV SV:        56 ml RIGHT VENTRICLE RV S prime:      17.00 cm/s TAPSE (M-mode): 3.1 cm LEFT ATRIUM             Index        RIGHT ATRIUM           Index LA diam:        3.20 cm 1.83 cm/m   RA Area:     21.60 cm LA Vol (A2C):   61.1 ml 35.01 ml/m  RA Volume:   71.10 ml  40.74 ml/m LA Vol (A4C):   48.8 ml 27.96 ml/m LA Biplane Vol: 54.8 ml 31.40 ml/m  AORTIC VALVE LVOT Vmax:   80.00 cm/s LVOT Vmean:  53.300 cm/s LVOT VTI:    0.194 m  AORTA Ao Root diam: 3.30 cm MITRAL VALVE               TRICUSPID VALVE MV Area (PHT): 2.60 cm    TR Peak grad:   23.0 mmHg MV Decel Time: 292 msec    TR Vmax:        240.00 cm/s MV E velocity: 57.50 cm/s MV A velocity: 94.00 cm/s  SHUNTS MV E/A ratio:  0.61        Systemic VTI:  0.19 m                            Systemic Diam: 1.90 cm Vishnu Priya Mallipeddi Electronically signed by Diannah Late Mallipeddi Signature Date/Time: 06/03/2024/12:15:08 PM    Final     Scheduled Meds:    [MAR Hold] sodium chloride    Intravenous Once   [MAR Hold] amiodarone   200 mg Oral Daily   [MAR Hold] levothyroxine   175 mcg Oral Q0600   [MAR Hold] loratadine   10 mg Oral Daily   [MAR Hold] mirtazapine   3.75 mg Oral QHS   [MAR Hold] pantoprazole  (PROTONIX ) IV  40 mg Intravenous Q24H   [MAR Hold] pravastatin   20 mg Oral q1800   [MAR Hold] tamsulosin   0.4 mg Oral Daily    Continuous Infusions:    sodium chloride  20 mL/hr at 06/04/24 0800     LOS: 0 days     Trenda Mar, MD,  FACP, [facility], [facility], [facility]   [facility]  Physician Advisor [place]      To contact the attending provider between 7A-7P or the covering provider during after hours 7P-7A, please log into the web site [email or web address] and access using universal [place] password for that web site. If you do not have the password, please call the hospital operator.  06/04/2024, 10:37 AM

## 2024-06-04 NOTE — Brief Op Note (Addendum)
 06/04/2024  11:56 AM  PATIENT:  [name]  83 y.o. male  PRE-OPERATIVE DIAGNOSIS:  rectal bleeding, acute blood loss anemia, weight loss, iron deficiency anemia, history of adenomatous colon polyps  POST-OPERATIVE DIAGNOSIS:  * No post-op diagnosis entered *  PROCEDURE:  Procedure(s): COLONOSCOPY (N/A) EGD (ESOPHAGOGASTRODUODENOSCOPY) (N/A)  SURGEON:  Surgeons and Role: Panel 1:    * Eartha Angelia Sieving, MD - Primary   Patient underwent EGD and colonoscopy under propofol  sedation.  Tolerated the procedure adequately.   EGD FINDINGS: - 1 cm hiatal hernia.  - Normal stomach.  - Normal examined duodenum.   COLONOSCOPY FINDINGS: - One 4 mm polyp in the ascending colon, removed with a cold snare.  Resected and retrieved.  -Scattered medium-mouthed diverticula were found in the sigmoid colon.  There was evidence of recent bleeding from the diverticular opening as there was presence of an adhered clot to a diverticulum. This was irrigated and there was presence of a slowly oozing vessel.  For hemostasis, one hemostatic clip was successfully placed (MR safe).  Clip manufacturer: [employer].  There was no bleeding at the end of the procedure.  -Non-bleeding internal hemorrhoids.   Note: diverticular bleeding is source  RECOMMENDATIONS - Return patient to hospital ward for ongoing care.  - Resume previous diet.  - Repeat colonoscopy is not recommended due to current age (64 years or older) for screening purposes.  - Daily H/H. - If patient presents recurrent clinical bleeding or hemodynamic instability, perform CT Angio bleed protocol STAT - consult IR if active bleeding is identified. - Await pathology results.   Sieving Eartha, MD Gastroenterology and Hepatology [facility]

## 2024-06-04 NOTE — Plan of Care (Signed)
   Problem: Education: Goal: Knowledge of General Education information will improve Description: Including pain rating scale, medication(s)/side effects and non-pharmacologic comfort measures Outcome: Progressing   Problem: Clinical Measurements: Goal: Ability to maintain clinical measurements within normal limits will improve Outcome: Progressing Goal: Diagnostic test results will improve Outcome: Progressing

## 2024-06-04 NOTE — Op Note (Signed)
 [facility] Patient Name: [name] Procedure Date: 06/04/2024 11:06 AM MRN: [id number] Date of Birth: 1941-09-19 Attending MD: Toribio Fortune , , [id number] CSN: [id number] Age: 83 Admit Type: Inpatient Procedure:                Colonoscopy Indications:              Rectal bleeding Providers:                Toribio Fortune, Olam Ada, RN, Jon Loge Referring MD:              Medicines:                Monitored Anesthesia Care Complications:            No immediate complications. Estimated Blood Loss:     Estimated blood loss: none. Procedure:                Pre-Anesthesia Assessment:                           - Prior to the procedure, a History and Physical                            was performed, and patient medications, allergies                            and sensitivities were reviewed. The patient's                            tolerance of previous anesthesia was reviewed.                           - The risks and benefits of the procedure and the                            sedation options and risks were discussed with the                            patient. All questions were answered and informed                            consent was obtained.                           - ASA Grade Assessment: III - A patient with severe                            systemic disease.                           After obtaining informed consent, the colonoscope                            was passed under direct vision. Throughout the                            procedure, the patient's blood pressure, pulse, and  oxygen saturations were monitored continuously. The                            [id number] ([id number]) Peds Colon was introduced                            through the anus and advanced to the the cecum,                            identified by appendiceal orifice and ileocecal                            valve. The colonoscopy was performed  without                            difficulty. The patient tolerated the procedure                            well. The quality of the bowel preparation was                            excellent. Scope In: 11:33:19 AM Scope Out: 11:53:17 AM Scope Withdrawal Time: 0 hours 10 minutes 37 seconds  Total Procedure Duration: 0 hours 19 minutes 58 seconds  Findings:      External hemorrhoids were found on perianal exam.      A 4 mm polyp was found in the ascending colon. The polyp was sessile.       The polyp was removed with a cold snare. Resection and retrieval were       complete.      Scattered medium-mouthed diverticula were found in the sigmoid colon.       There was evidence of recent bleeding from the diverticular opening as       there was presence of an adhered clot to a diverticulum. This was       irrigated and there was presence of a slowly oozing vessel. For       hemostasis, one hemostatic clip was successfully placed (MR safe). Clip       manufacturer: [employer]. There was no bleeding at the end of the       procedure.      Non-bleeding internal hemorrhoids were found during retroflexion. The       hemorrhoids were small. Impression:               - Hemorrhoids found on perianal exam.                           - One 4 mm polyp in the ascending colon, removed                            with a cold snare. Resected and retrieved.                           - Diverticulosis in the sigmoid colon. There was  evidence of recent bleeding from the diverticular                            opening. Clip manufacturer: [employer]. Clip                            (MR safe) was placed.                           - Non-bleeding internal hemorrhoids. Moderate Sedation:      Per Anesthesia Care Recommendation:           - Return patient to hospital ward for ongoing care.                           - Resume previous diet.                           - Repeat  colonoscopy is not recommended due to                            current age (61 years or older) for screening                            purposes.                           - Daily H/H.                           - If patient presents recurrent clinical bleeding                            or hemodynamic instability, perform CT Angio bleed                            protocol STAT - consult IR if active bleeding is                            identified.                           - Await pathology results. Procedure Code(s):        --- Professional ---                           [phone], 59, Colonoscopy, flexible; with control of                            bleeding, any method                           45385, Colonoscopy, flexible; with removal of                            tumor(s), polyp(s), or other lesion(s) by snare  technique Diagnosis Code(s):        --- Professional ---                           D12.2, Benign neoplasm of ascending colon                           K64.8, Other hemorrhoids                           K57.31, Diverticulosis of large intestine without                            perforation or abscess with bleeding                           K62.5, Hemorrhage of anus and rectum CPT copyright 2022 American [employer]. All rights reserved. The codes documented in this report are preliminary and upon coder review may  be revised to meet current compliance requirements. Toribio Fortune, MD Toribio Fortune,  06/04/2024 12:09:19 PM This report has been signed electronically. Number of Addenda: 0

## 2024-06-04 NOTE — Plan of Care (Signed)
  Problem: Clinical Measurements: Goal: Ability to maintain clinical measurements within normal limits will improve Outcome: Progressing Goal: Will remain free from infection Outcome: Progressing   Problem: Activity: Goal: Risk for activity intolerance will decrease Outcome: Progressing   Problem: Coping: Goal: Level of anxiety will decrease Outcome: Progressing   Problem: Elimination: Goal: Will not experience complications related to bowel motility Outcome: Progressing Goal: Will not experience complications related to urinary retention Outcome: Progressing   Problem: Pain Managment: Goal: General experience of comfort will improve and/or be controlled Outcome: Progressing   Problem: Safety: Goal: Ability to remain free from injury will improve Outcome: Progressing   Problem: Skin Integrity: Goal: Risk for impaired skin integrity will decrease Outcome: Progressing

## 2024-06-04 NOTE — Transfer of Care (Signed)
 Immediate Anesthesia Transfer of Care Note  Patient: [name]  Procedure(s) Performed: COLONOSCOPY EGD (ESOPHAGOGASTRODUODENOSCOPY)  Patient Location: PACU  Anesthesia Type:MAC  Level of Consciousness: drowsy  Airway & Oxygen Therapy: Patient Spontanous Breathing  Post-op Assessment: Report given to RN  Post vital signs: Reviewed and stable  Last Vitals:  Vitals Value Taken Time  BP 109/70 06/04/24 11:59  Temp 36.4 C 06/04/24 11:59  Pulse 92 06/04/24 12:00  Resp 16 06/04/24 12:00  SpO2 100 % 06/04/24 12:00  Vitals shown include unfiled device data.  Last Pain:  Vitals:   06/04/24 1026  TempSrc:   PainSc: 0-No pain         Complications: No notable events documented.

## 2024-06-04 NOTE — Op Note (Signed)
 [facility] Patient Name: [name] Procedure Date: 06/04/2024 11:07 AM MRN: [id number] Date of Birth: 11-09-1940 Attending MD: Toribio Fortune , , [id number] CSN: [id number] Age: 83 Admit Type: Inpatient Procedure:                Upper GI endoscopy Indications:              Hematochezia, Anemia Providers:                Toribio Fortune, Olam Ada, RN, Jon Loge Referring MD:              Medicines:                Monitored Anesthesia Care Complications:            No immediate complications. Estimated Blood Loss:     Estimated blood loss: none. Procedure:                Pre-Anesthesia Assessment:                           - Prior to the procedure, a History and Physical                            was performed, and patient medications, allergies                            and sensitivities were reviewed. The patient's                            tolerance of previous anesthesia was reviewed.                           - The risks and benefits of the procedure and the                            sedation options and risks were discussed with the                            patient. All questions were answered and informed                            consent was obtained.                           - ASA Grade Assessment: III - A patient with severe                            systemic disease.                           After obtaining informed consent, the endoscope was                            passed under direct vision. Throughout the                            procedure, the patient's blood pressure,  pulse, and                            oxygen saturations were monitored continuously. The                            [id number] ([id number])Leezm was introduced through the                            mouth, and advanced to the second part of duodenum.                            The upper GI endoscopy was accomplished without                            difficulty. The  patient tolerated the procedure                            well. Scope In: 11:23:35 AM Scope Out: 11:26:48 AM Total Procedure Duration: 0 hours 3 minutes 13 seconds  Findings:      A 1 cm hiatal hernia was present.      The gastroesophageal flap valve was visualized endoscopically and       classified as Hill Grade II (fold present, opens with respiration).      The stomach was normal.      The examined duodenum was normal. Impression:               - 1 cm hiatal hernia.                           - Normal stomach.                           - Normal examined duodenum.                           - No specimens collected. Moderate Sedation:      Per Anesthesia Care Recommendation:           Proceed with colonoscopy Procedure Code(s):        --- Professional ---                           [phone], Esophagogastroduodenoscopy, flexible,                            transoral; diagnostic, including collection of                            specimen(s) by brushing or washing, when performed                            (separate procedure) Diagnosis Code(s):        --- Professional ---                           K44.9, Diaphragmatic hernia without obstruction or  gangrene                           K92.1, Melena (includes Hematochezia)                           D64.9, Anemia, unspecified CPT copyright 2022 American Medical Association. All rights reserved. The codes documented in this report are preliminary and upon coder review may  be revised to meet current compliance requirements. Toribio Fortune, MD Toribio Fortune,  06/04/2024 11:56:58 AM This report has been signed electronically. Number of Addenda: 0

## 2024-06-04 NOTE — Progress Notes (Signed)
 Dr Eartha spoke with patient at the bedside.

## 2024-06-04 NOTE — Anesthesia Preprocedure Evaluation (Signed)
 Anesthesia Evaluation  Patient identified by MRN, date of birth, ID band Patient awake    Reviewed: Allergy & Precautions, H&P , NPO status , Patient's Chart, lab work & pertinent test results, reviewed documented beta blocker date and time   Airway Mallampati: II  TM Distance: >3 FB Neck ROM: full    Dental no notable dental hx.    Pulmonary neg pulmonary ROS   Pulmonary exam normal breath sounds clear to auscultation       Cardiovascular Exercise Tolerance: Good hypertension, +CHF   Rhythm:regular Rate:Normal     Neuro/Psych  PSYCHIATRIC DISORDERS      negative neurological ROS     GI/Hepatic negative GI ROS, Neg liver ROS,,,  Endo/Other  Hypothyroidism    Renal/GU negative Renal ROS  negative genitourinary   Musculoskeletal   Abdominal   Peds  Hematology  (+) Blood dyscrasia, anemia   Anesthesia Other Findings   Reproductive/Obstetrics negative OB ROS                              Anesthesia Physical Anesthesia Plan  ASA: 4 and emergent  Anesthesia Plan: General   Post-op Pain Management:    Induction:   PONV Risk Score and Plan: Propofol  infusion  Airway Management Planned:   Additional Equipment:   Intra-op Plan:   Post-operative Plan:   Informed Consent: I have reviewed the patients History and Physical, chart, labs and discussed the procedure including the risks, benefits and alternatives for the proposed anesthesia with the patient or authorized representative who has indicated his/her understanding and acceptance.     Dental Advisory Given  Plan Discussed with: CRNA  Anesthesia Plan Comments:         Anesthesia Quick Evaluation

## 2024-06-04 NOTE — Progress Notes (Signed)
 We will proceed with Egd and colonoscopy as scheduled.  I thoroughly discussed with the patient the procedure, including the risks involved. Patient understands what the procedure involves including the benefits and any risks. Patient understands alternatives to the proposed procedure. Risks including (but not limited to) bleeding, tearing of the lining (perforation), rupture of adjacent organs, problems with heart and lung function, infection, and medication reactions. A small percentage of complications may require surgery, hospitalization, repeat endoscopic procedure, and/or transfusion.  Patient understood and agreed.  Katrinka Blazing, MD Gastroenterology and Hepatology [facility]

## 2024-06-04 NOTE — Progress Notes (Signed)
 Pt drank and emptied most of the Bowel Prep for colonoscopy procedure this morning.   Administered 'Large Tap Water  Enema'. Pt tolerated well. Was able to produce two clear liquid stools this morning with some small pebbles of round formed brown stool.  Pt remained NPO at midnight for Colonoscopy procedure.

## 2024-06-05 ENCOUNTER — Telehealth: Payer: Self-pay | Admitting: Gastroenterology

## 2024-06-05 DIAGNOSIS — D62 Acute posthemorrhagic anemia: Secondary | ICD-10-CM | POA: Diagnosis not present

## 2024-06-05 LAB — BASIC METABOLIC PANEL WITH GFR
Anion gap: 7 (ref 5–15)
BUN: 8 mg/dL (ref 8–23)
CO2: 25 mmol/L (ref 22–32)
Calcium: 7.9 mg/dL — ABNORMAL LOW (ref 8.9–10.3)
Chloride: 106 mmol/L (ref 98–111)
Creatinine, Ser: 1.36 mg/dL — ABNORMAL HIGH (ref 0.61–1.24)
GFR, Estimated: 51 mL/min — ABNORMAL LOW (ref >60)
Glucose, Bld: 85 mg/dL (ref 70–99)
Potassium: 4.1 mmol/L (ref 3.5–5.1)
Sodium: 138 mmol/L (ref 135–145)

## 2024-06-05 LAB — CBC
HCT: 23.9 % — ABNORMAL LOW (ref 39.0–52.0)
HCT: 26 % — ABNORMAL LOW (ref 39.0–52.0)
Hemoglobin: 7.7 g/dL — ABNORMAL LOW (ref 13.0–17.0)
Hemoglobin: 8.5 g/dL — ABNORMAL LOW (ref 13.0–17.0)
MCH: 26.9 pg (ref 26.0–34.0)
MCH: 27.2 pg (ref 26.0–34.0)
MCHC: 32.2 g/dL (ref 30.0–36.0)
MCHC: 32.7 g/dL (ref 30.0–36.0)
MCV: 83.6 fL (ref 80.0–100.0)
MCV: 83.1 fL (ref 80.0–100.0)
Platelets: 150 K/uL (ref 150–400)
Platelets: 168 K/uL (ref 150–400)
RBC: 2.86 MIL/uL — ABNORMAL LOW (ref 4.22–5.81)
RBC: 3.13 MIL/uL — ABNORMAL LOW (ref 4.22–5.81)
RDW: 16.1 % — ABNORMAL HIGH (ref 11.5–15.5)
RDW: 16.5 % — ABNORMAL HIGH (ref 11.5–15.5)
WBC: 3.7 K/uL — ABNORMAL LOW (ref 4.0–10.5)
WBC: 4.6 K/uL (ref 4.0–10.5)
nRBC: 0 % (ref 0.0–0.2)
nRBC: 0 % (ref 0.0–0.2)

## 2024-06-05 LAB — MAGNESIUM: Magnesium: 2.3 mg/dL (ref 1.7–2.4)

## 2024-06-05 MED ORDER — MIRTAZAPINE 7.5 MG PO TABS: 3.7500 mg | ORAL_TABLET | Freq: Every day | ORAL | Status: DC

## 2024-06-05 NOTE — Progress Notes (Signed)
 Informed Consent for blood administration was obtained from the patient and placed in the chart. This Clinical research associate reviewed over consent with the patient as well as educating pt on the benefits and risks associated with receiving blood transfusion. Pt understands and wants to proceed forward with getting the unit of blood ordered by the MD.

## 2024-06-05 NOTE — Plan of Care (Signed)

## 2024-06-05 NOTE — Progress Notes (Signed)
 1 unit of PRBC has been inititated for infusion at this time with a second RN to verify. Pt was already aware that he was going to be getting a blood transfusion and stated to this writer I waited all day but it never came. Pt denies having any reactions to previous blood transfusions. Pts vitals are stable. Pt is resting in bed at this time with call light within reach. Will monitor for 15 minutes and recheck vital signs to ensure pt is able to tolerate blood transfusion without any adverse reactions and if pt is able to tolerate, this writer will continue to check in on the patient during transfusion. Pt educated to notify this Clinical research associate immediately if he felt as if he was having any side effects or reactions to the blood being transfused. Pt agreed, but denied having any complications.

## 2024-06-05 NOTE — Progress Notes (Signed)
 Patient completed blood transfusion earlier this shift without any adverse side effects or reactions noted. Pt tolerated well. Pt states I feel just fine. The lab did come by and thought that the patients blood was not complete, so the lab tech rescheduled for the  patients CBC to be collected this morning at 0700.

## 2024-06-05 NOTE — Discharge Summary (Signed)
 Physician Discharge Summary  [name] FMW:[id number] DOB: 10/06/1941  PCP: Antonetta Rollene BRAVO, MD  Admitted from: Home Discharged to: Home  Admit date: 06/02/2024 Discharge date: 06/05/2024  Recommendations for Outpatient Follow-up:    Follow-up Information     Antonetta Rollene BRAVO, MD. Schedule an appointment as soon as possible for a visit in 1 week(s).   Specialty: Family Medicine Why: To be seen with repeat labs (CBC & BMP).  Also follow-up to determine if 2 of the blood pressure medications that were held at hospital discharge can now be resumed. Contact information: [address] [place] [address] [phone]                  Home Health: None    Equipment/Devices: None    Discharge Condition: Improved and stable    Code Status: Full Code Diet recommendation:  Discharge Diet Orders (From admission, onward)     Start     Ordered   06/05/24 0000  Diet - low sodium heart healthy        06/05/24 1214             Discharge Diagnoses:  Principal Problem:   Rectal bleed Active Problems:   Near syncope   Hypothyroidism   Essential hypertension   Chronic systolic CHF (congestive heart failure) (HCC)   Frequent PVCs   Non-ischemic cardiomyopathy [facility])   Hematochezia   Brief Hospital Course:  83 year old male with PMH of chronic systolic CHF (EF 59-54% in 06/2023), non ischemic cardiomyopathy felt to be due to PVCs, post thyroidectomy hypothyroidism, HLD, HTN, prediabetes, prostate cancer s/p XRT, BPH, presented to the ED on 06/02/2024 with abrupt onset of multiple episodes of painless bright red rectal bleeding.  No nausea, vomiting, abdominal pain or dark stools.  Rarely takes NSAIDs.  He is on aspirin  81 mg daily chronically.  Also reported feeling dizzy when he got up but did not pass out.   Admitted for acute lower GI bleeding/rectal bleeding, acute blood loss anemia related to same and presyncope.  Transfused PRBC for  anemia.  GI consulted and underwent EGD/colonoscopy on 8/22.     Assessment & Plan:    Acute diverticular bleeding CT A/P 8/5: No acute findings.  No evidence of malignancy.  Colonic diverticulosis. Treated supportively with bowel rest/n.p.o., IV PPI and gentle IV fluids GI consulted and patient underwent EGD and colonoscopy on 8/22.  Findings as noted below.  Patient has had no BMs postprocedure.  Posttransfusion, hemoglobin improved and stable.  GI has seen and cleared for discharge.  I communicated with Dr. Eartha, okay to resume aspirin  81 mg daily at discharge and patient does not need PPI.  His office will arrange follow-up in 3 to 4 weeks.  EGD FINDINGS: - 1 cm hiatal hernia.  - Normal stomach.  - Normal examined duodenum.  COLONOSCOPY FINDINGS: - One 4 mm polyp in the ascending colon, removed with a cold snare.  Resected and retrieved.  -Scattered medium-mouthed diverticula were found in the sigmoid colon.  There was evidence of recent bleeding from the diverticular opening as there was presence of an adhered clot to a diverticulum. This was irrigated and there was presence of a slowly oozing vessel.  For hemostasis, one hemostatic clip was successfully placed (MR safe).  Clip manufacturer: [employer].  There was no bleeding at the end of the procedure.  -Non-bleeding internal hemorrhoids.    Acute posthemorrhagic anemia Hemoglobin 14.3 on 04/22/2023, 12.2 on 04/13/2024.  Presented to ED on 8/20 with hemoglobin of 11.1 which has drifted down gradually to 7.2 on 8/22 Secondary to GI bleeding S/p 1 unit PRBC transfusion on 8/22, hemoglobin appropriately improved to 7.7 > 8.5 on day of discharge. Close outpatient follow-up with repeat CBC.   Presyncope Secondary to acute blood loss anemia and vasovagal Sinus rhythm on telemetry. Serial troponins negative, ruled out for ACS.  TTE shows LVEF 45%, LV global hypokinesis, grade 1 diastolic dysfunction and no aortic stenosis.  No  significant change from prior echo. Orthostatic vital signs negative.   Chronic systolic CHF/NICM/frequent PVCs Compensated.  Resumed Farxiga  and as needed spironolactone  at DC.   Repeat echo results as above.   Frequent PVCs Last saw cardiology in office on 02/06/2024, recommendation was to continue amiodarone  200 mg once daily. Sinus rhythm on telemetry without significant PVCs Continue home dose of amiodarone  and resume Toprol -XL.   Essential hypertension Controlled.  Orthostatic vitals negative. In the context of acute GI bleed, in the hospital, held amlodipine , losartan  and Toprol -XL.  At time of discharge, resumed Toprol -XL but continue to hold amlodipine  and losartan  until close outpatient follow-up with PCP.   Hyperlipidemia Continue home dose of statins   Post thyroidectomy hypothyroidism Continue Synthroid .   Prolonged QTc EKG 06/02/2024 personally reviewed: Sinus rhythm, QTc 529 ms.  No acute findings. Attempt to keep K >4 and magnesium  >2.  Avoid QT prolonging medications. EKG 8/22 personally reviewed, QTc 508 ms.  Potassium and magnesium  are appropriately replaced. Outpatient follow-up.   CKD stage IIIa Baseline creatinine probably in the 1.4-1.5 range Creatinine has improved to 1.21 >1.36.  Avoid nephrotoxics. Periodically follow-up BMP as outpatient.   BPH/history of prostate cancer Continue home dose of Flomax .   Body mass index is 21.3 kg/m.       Consultants:   GI.     Procedures:       Discharge Instructions  Discharge Instructions     (HEART FAILURE PATIENTS) Call MD:  Anytime you have any of the following symptoms: 1) 3 pound weight gain in 24 hours or 5 pounds in 1 week 2) shortness of breath, with or without a dry hacking cough 3) swelling in the hands, feet or stomach 4) if you have to sleep on extra pillows at night in order to breathe.   Complete by: As directed    Call MD for:   Complete by: As directed    Recurrent rectal bleeding.    Call MD for:  difficulty breathing, headache or visual disturbances   Complete by: As directed    Call MD for:  extreme fatigue   Complete by: As directed    Call MD for:  persistant dizziness or light-headedness   Complete by: As directed    Call MD for:  persistant nausea and vomiting   Complete by: As directed    Diet - low sodium heart healthy   Complete by: As directed    Increase activity slowly   Complete by: As directed         Medication List     PAUSE taking these medications    amLODipine  10 MG tablet Wait to take this until your doctor or other care provider tells you to start again. Commonly known as: NORVASC  TAKE ONE TABLET BY MOUTH ONCE DAILY.   losartan  50 MG tablet Wait to take this until your doctor or other care provider tells you to start again. Commonly known as: COZAAR  Take 1 tablet (50 mg total) by  mouth daily.       TAKE these medications    amiodarone  200 MG tablet Commonly known as: PACERONE  Take 200 mg once daily except do not take on Sundays. What changed:  how much to take how to take this when to take this   aspirin EC 81 MG tablet Take 1 tablet (81 mg total) by mouth daily.   cetirizine 10 MG tablet Commonly known as: ZYRTEC TAKE 1 TABLET EVERY DAY   dapagliflozin propanediol 10 MG Tabs tablet Commonly known as: Farxiga Take 1 tablet (10 mg total) by mouth daily before breakfast.   EPINEPHrine 0.3 mg/0.3 mL Soaj injection Commonly known as: EPI-PEN Inject 0.3 mLs (0.3 mg total) into the muscle as needed for anaphylaxis.   levothyroxine 100 MCG tablet Commonly known as: SYNTHROID Take 100 mcg by mouth daily.   levothyroxine 75 MCG tablet Commonly known as: SYNTHROID Take 1 tablet (75 mcg total) by mouth daily.   lovastatin 20 MG tablet Commonly known as: MEVACOR Take 20 mg by mouth at bedtime.   metoprolol succinate 50 MG 24 hr tablet Commonly known as: TOPROL-XL Take 1 tablet (50 mg total) by mouth daily.    mirtazapine 7.5 MG tablet Commonly known as: REMERON Take 0.5 tablets (3.75 mg total) by mouth at bedtime.   multivitamin with minerals Tabs tablet Take 1 tablet by mouth daily.   spironolactone 25 MG tablet Commonly known as: ALDACTONE Take 25 mg by mouth daily as needed (swelling).   tadalafil 5 MG tablet Commonly known as: CIALIS TAKE ONE TABLET BY MOUTH ONCE DAILY.   tamsulosin 0.4 MG Caps capsule Commonly known as: FLOMAX Take 1 capsule (0.4 mg total) by mouth daily.       Allergies  Allergen Reactions   Peanut-Containing Drug Products Shortness Of Breath and Swelling   Egg-Derived Products Swelling   Iodine Swelling   Penicillins Hives, Rash and Other (See Comments)    Severe rash involving mucus membranes or skin necrosis   Shellfish Allergy Swelling      Procedures/Studies: ECHOCARDIOGRAM COMPLETE Result Date: 06/03/2024    ECHOCARDIOGRAM REPORT   Patient Name:   [name] Date of Exam: 06/03/2024 Medical Rec #:  [id number]            Height:       67.5 in Accession #:    [id number]           Weight:       139.6 lb Date of Birth:  06/16/1941             BSA:          1.745 m Patient Age:    83 years             BP:           127/95 mmHg Patient Gender: M                    HR:           69  bpm. Exam Location:  [facility] Procedure: 2D Echo, 3D Echo, Cardiac Doppler, Color Doppler and Strain Analysis            (Both Spectral and Color Flow Doppler were utilized during            procedure). Indications:    Syncope R55  History:        Patient has prior history of Echocardiogram examinations, most  recent 06/19/2023. CHF and Cardiomyopathy, Arrythmias:PVC; Risk                 Factors:Dyslipidemia, Hypertension, Prediabetes and Non-Smoker.  Sonographer:    Aida Pizza RCS Referring Phys: [phone] Khayla Koppenhaver D Jaser Fullen  Sonographer Comments: Global longitudinal strain was attempted. IMPRESSIONS  1. Left ventricular ejection fraction, by estimation, is 45%.  Left ventricular ejection fraction by 3D volume is 44 %. The left ventricle has mildly decreased function. The left ventricle demonstrates global hypokinesis. Left ventricular diastolic parameters are consistent with Grade I diastolic dysfunction (impaired relaxation). The average left ventricular global longitudinal strain is -13.4 %. The global longitudinal strain is abnormal.  2. Right ventricular systolic function is normal. The right ventricular size is normal. There is normal pulmonary artery systolic pressure.  3. Right atrial size was moderately dilated.  4. The mitral valve is normal in structure. Mild mitral valve regurgitation. No evidence of mitral stenosis.  5. The aortic valve is tricuspid. Aortic valve regurgitation is mild. No aortic stenosis is present.  6. Pulmonic valve regurgitation is moderate.  7. The inferior vena cava is normal in size with greater than 50% respiratory variability, suggesting right atrial pressure of 3 mmHg. Comparison(s): No significant change from prior study. FINDINGS  Left Ventricle: Left ventricular ejection fraction, by estimation, is 45%. Left ventricular ejection fraction by 3D volume is 44 %. The left ventricle has mildly decreased function. The left ventricle demonstrates global hypokinesis. The average left ventricular global longitudinal strain is -13.4 %. Strain was performed and the global longitudinal strain is abnormal. The left ventricular internal cavity size was normal in size. There is no left ventricular hypertrophy. Left ventricular diastolic parameters are consistent with Grade I diastolic dysfunction (impaired relaxation). Normal left ventricular filling pressure. Right Ventricle: The right ventricular size is normal. No increase in right ventricular wall thickness. Right ventricular systolic function is normal. There is normal pulmonary artery systolic pressure. The tricuspid regurgitant velocity is 2.40 m/s, and  with an assumed right atrial pressure  of 3 mmHg, the estimated right ventricular systolic pressure is 26.0 mmHg. Left Atrium: Left atrial size was normal in size. Right Atrium: Right atrial size was moderately dilated. Pericardium: There is no evidence of pericardial effusion. Mitral Valve: The mitral valve is normal in structure. Mild mitral valve regurgitation. No evidence of mitral valve stenosis. Tricuspid Valve: The tricuspid valve is normal in structure. Tricuspid valve regurgitation is mild . No evidence of tricuspid stenosis. Aortic Valve: The aortic valve is tricuspid. Aortic valve regurgitation is mild. No aortic stenosis is present. Pulmonic Valve: The pulmonic valve was not well visualized. Pulmonic valve regurgitation is moderate. No evidence of pulmonic stenosis. Aorta: The aortic root is normal in size and structure. Venous: The inferior vena cava is normal in size with greater than 50% respiratory variability, suggesting right atrial pressure of 3 mmHg. IAS/Shunts: No atrial level shunt detected by color flow Doppler. Additional Comments: 3D was performed not requiring image post processing on an independent workstation and was abnormal.  LEFT VENTRICLE PLAX 2D LVIDd:         5.30 cm         Diastology LVIDs:         3.90 cm         LV e' medial:    4.35 cm/s LV PW:         1.10 cm         LV E/e' medial:  13.2 LV IVS:  1.00 cm         LV e' lateral:   5.84 cm/s LVOT diam:     1.90 cm         LV E/e' lateral: 9.8 LV SV:         55 LV SV Index:   32              2D Longitudinal LVOT Area:     2.84 cm        Strain                                2D Strain GLS   -13.4 %                                Avg: LV Volumes (MOD) LV vol d, MOD    112.5 ml      3D Volume EF A2C:                           LV 3D EF:    Left LV vol d, MOD    117.0 ml                   ventricul A4C:                                        ar LV vol s, MOD    58.3 ml                    ejection A2C:                                        fraction LV vol s, MOD     58.2 ml                    by 3D A4C:                                        volume is LV SV MOD A2C:   54.2 ml                    44 %. LV SV MOD A4C:   117.0 ml LV SV MOD BP:    53.7 ml                                3D Volume EF:                                3D EF:        44 %                                LV EDV:       127 ml  LV ESV:       71 ml                                LV SV:        56 ml RIGHT VENTRICLE RV S prime:     17.00 cm/s TAPSE (M-mode): 3.1 cm LEFT ATRIUM             Index        RIGHT ATRIUM           Index LA diam:        3.20 cm 1.83 cm/m   RA Area:     21.60 cm LA Vol (A2C):   61.1 ml 35.01 ml/m  RA Volume:   71.10 ml  40.74 ml/m LA Vol (A4C):   48.8 ml 27.96 ml/m LA Biplane Vol: 54.8 ml 31.40 ml/m  AORTIC VALVE LVOT Vmax:   80.00 cm/s LVOT Vmean:  53.300 cm/s LVOT VTI:    0.194 m  AORTA Ao Root diam: 3.30 cm MITRAL VALVE               TRICUSPID VALVE MV Area (PHT): 2.60 cm    TR Peak grad:   23.0 mmHg MV Decel Time: 292 msec    TR Vmax:        240.00 cm/s MV E velocity: 57.50 cm/s MV A velocity: 94.00 cm/s  SHUNTS MV E/A ratio:  0.61        Systemic VTI:  0.19 m                            Systemic Diam: 1.90 cm Vishnu Priya Mallipeddi Electronically signed by Diannah Late Mallipeddi Signature Date/Time: 06/03/2024/12:15:08 PM    Final    CT ABDOMEN PELVIS W CONTRAST Result Date: 05/18/2024 CLINICAL DATA:  Unintentional weight loss. EXAM: CT ABDOMEN AND PELVIS WITH CONTRAST TECHNIQUE: Multidetector CT imaging of the abdomen and pelvis was performed using the standard protocol following bolus administration of intravenous contrast. RADIATION DOSE REDUCTION: This exam was performed according to the departmental dose-optimization program which includes automated exposure control, adjustment of the mA and/or kV according to patient size and/or use of iterative reconstruction technique. CONTRAST:  OMNIPAQUE  IOHEXOL  300 MG/ML  SOLN COMPARISON:   07/18/2011 FINDINGS: Lower Chest: No acute findings. Hepatobiliary: No suspicious hepatic masses identified. Multiple tiny appendix cysts are again noted. Tiny gallstones again seen, without findings of cholecystitis or biliary ductal dilatation. Pancreas:  No mass or inflammatory changes. Spleen: Within normal limits in size and appearance. Adrenals/Urinary Tract: Multiple benign-appearing left renal cysts are again seen, largest measuring approximately 15 cm. Several small benign-appearing right renal cysts are again noted. (No followup imaging is recommended). No suspicious masses identified. No evidence of ureteral calculi or hydronephrosis. Mild diffuse bladder wall thickening again seen, presumably due to chronic bladder outlet obstruction given enlarged prostate. Stomach/Bowel: No evidence of obstruction, inflammatory process or abnormal fluid collections. Diverticulosis is seen mainly involving the sigmoid colon, however there is no evidence of diverticulitis. Vascular/Lymphatic: No pathologically enlarged lymph nodes. No acute vascular findings. Reproductive: Stable mildly enlarged prostate, with prior TURP defect. Several fiducial markers again noted. Other:  None. Musculoskeletal: No suspicious bone lesions identified. Old compression fracture deformity of L3 vertebral body noted. IMPRESSION: No acute findings. No evidence of malignancy. Stable mildly enlarged prostate, with prior TURP defect, and findings of chronic  bladder obstruction. Cholelithiasis. No radiographic evidence of cholecystitis. Colonic diverticulosis, without radiographic evidence of diverticulitis. Electronically Signed   By: Norleen DELENA Kil M.D.   On: 05/18/2024 14:14      Subjective: Patient evaluated along with his RN at bedside.  Denies complaints.  No BM since procedures yesterday.  Denies any bleeding.  No chest pain, dyspnea, dizziness, lightheadedness even with ambulation in the room.  Eager for discharge.  Discharge  Exam:  Vitals:   06/05/24 0000 06/05/24 0028 06/05/24 0315 06/05/24 0348  BP: 110/78 109/75 120/66 119/77  Pulse: 76 71 72 76  Resp:  20  18  Temp: 98.7 F (37.1 C) 98.5 F (36.9 C) 98.7 F (37.1 C) 98.9 F (37.2 C)  TempSrc: Oral Oral Oral Oral  SpO2: 99% 98% 99% 96%  Weight:      Height:        General exam: Elderly male, moderately built and nourished comfortably supine in bed without distress. Respiratory system: Clear to auscultation. Respiratory effort normal.  Midline sternotomy scar. Cardiovascular system: S1 & S2 heard, RRR. No JVD, murmurs, rubs, gallops or clicks. No pedal edema.  Telemetry personally reviewed: Sinus rhythm with occasional PVCs. Gastrointestinal system: Abdomen is nondistended, soft and nontender. No organomegaly or masses felt. Normal bowel sounds heard. Central nervous system: Alert and oriented. No focal neurological deficits. Extremities: Symmetric 5 x 5 power. Skin: No rashes, lesions or ulcers Psychiatry: Judgement and insight appear normal. Mood & affect appropriate.     The results of significant diagnostics from this hospitalization (including imaging, microbiology, ancillary and laboratory) are listed below for reference.     Microbiology: No results found for this or any previous visit (from the past 240 hours).   Labs: CBC: Recent Labs  Lab 06/03/24 0958 06/03/24 1839 06/04/24 0403 06/05/24 0605 06/05/24 1146  WBC 4.7 6.1 4.7 3.7* 4.6  HGB 8.7* 7.7* 7.2* 7.7* 8.5*  HCT 28.3* 25.2* 22.4* 23.9* 26.0*  MCV 84.5 84.6 83.0 83.6 83.1  PLT 176 168 148* 150 168    Basic Metabolic Panel: Recent Labs  Lab 06/02/24 1353 06/02/24 1825 06/04/24 0403 06/05/24 0605  NA 143  --  137 138  K 3.8  --  3.5 4.1  CL 107  --  107 106  CO2 24  --  23 25  GLUCOSE 102*  --  70 85  BUN 17  --  12 8  CREATININE 1.39*  --  1.21 1.36*  CALCIUM 8.6*  --  8.0* 7.9*  MG  --  2.2 1.8 2.3    Liver Function Tests: Recent Labs  Lab  06/02/24 1353  AST 18  ALT 11  ALKPHOS 59  BILITOT 0.9  PROT 6.5  ALBUMIN 3.5    Unable to reach patient's spouse via phone, left VM message.  Unable to reach patient's son via phone either.   Time coordinating discharge: 35 minutes  SIGNED:  Trenda Mar, MD,  FACP, [facility], [facility], [facility]   [facility] [place]     To contact the attending provider between 7A-7P or the covering provider during after hours 7P-7A, please log into the web site [email or web address] and access using universal [place] password for that web site. If you do not have the password, please call the hospital operator.

## 2024-06-05 NOTE — Progress Notes (Signed)
 [name], M.D. Gastroenterology & Hepatology   Interval History:  Patient reports feeling well, denies having any complaints, no melena or hematochezia, states his stools will clear today.  No abdominal pain, nausea, vomiting, fever or chills. Received blood transfusion, repeat hemoglobin today was 7.7.  Inpatient Medications:  Current Facility-Administered Medications:    0.9 %  sodium chloride  infusion (Manually program via Guardrails IV Fluids), , Intravenous, Once, Hongalgi, Anand D, MD   acetaminophen  (TYLENOL ) tablet 650 mg, 650 mg, Oral, Q6H PRN **OR** acetaminophen  (TYLENOL ) suppository 650 mg, 650 mg, Rectal, Q6H PRN, Emokpae, Ejiroghene E, MD   amiodarone  (PACERONE ) tablet 200 mg, 200 mg, Oral, Daily, Emokpae, Ejiroghene E, MD, 200 mg at 06/05/24 9191   lactated ringers  infusion, , Intravenous, Continuous, Judeth Trenda BIRCH, MD, Last Rate: 50 mL/hr at 06/04/24 1340, New Bag at 06/04/24 1340   levothyroxine  (SYNTHROID ) tablet 175 mcg, 175 mcg, Oral, Q0600, Emokpae, Ejiroghene E, MD, 175 mcg at 06/05/24 9378   loratadine  (CLARITIN ) tablet 10 mg, 10 mg, Oral, Daily, Emokpae, Ejiroghene E, MD, 10 mg at 06/05/24 0807   mirtazapine  (REMERON ) tablet 3.75 mg, 3.75 mg, Oral, QHS, Emokpae, Ejiroghene E, MD, 3.75 mg at 06/04/24 2109   pantoprazole  (PROTONIX ) injection 40 mg, 40 mg, Intravenous, Q24H, Emokpae, Ejiroghene E, MD, 40 mg at 06/05/24 9191   polyethylene glycol (MIRALAX  / GLYCOLAX ) packet 17 g, 17 g, Oral, Daily PRN, Emokpae, Ejiroghene E, MD   pravastatin  (PRAVACHOL ) tablet 20 mg, 20 mg, Oral, q1800, Hongalgi, Anand D, MD, 20 mg at 06/04/24 1706   tamsulosin  (FLOMAX ) capsule 0.4 mg, 0.4 mg, Oral, Daily, Hongalgi, Anand D, MD, 0.4 mg at 06/05/24 9191   I/O    Intake/Output Summary (Last 24 hours) at 06/05/2024 0951 Last data filed at 06/05/2024 0900 Gross per 24 hour  Intake 2918.77 ml  Output 200 ml  Net 2718.77 ml     Physical Exam: Temp:  [97.5 F (36.4 C)-99 F  (37.2 C)] 98.9 F (37.2 C) (08/23 0348) Pulse Rate:  [71-95] 76 (08/23 0348) Resp:  [12-20] 18 (08/23 0348) BP: (106-144)/(56-87) 119/77 (08/23 0348) SpO2:  [96 %-100 %] 96 % (08/23 0348)  Temp (24hrs), Avg:98.5 F (36.9 C), Min:97.5 F (36.4 C), Max:99 F (37.2 C)  GENERAL: The patient is AO x3, in no acute distress. HEENT: Head is normocephalic and atraumatic. EOMI are intact. Mouth is well hydrated and without lesions. NECK: Supple. No masses LUNGS: Clear to auscultation. No presence of rhonchi/wheezing/rales. Adequate chest expansion HEART: RRR, normal s1 and s2. ABDOMEN: Soft, nontender, no guarding, no peritoneal signs, and nondistended. BS +. No masses. EXTREMITIES: Without any cyanosis, clubbing, rash, lesions or edema. NEUROLOGIC: AOx3, no focal motor deficit. SKIN: no jaundice, no rashes  Laboratory Data: CBC:     Component Value Date/Time   WBC 3.7 (L) 06/05/2024 0605   RBC 2.86 (L) 06/05/2024 0605   HGB 7.7 (L) 06/05/2024 0605   HGB 12.2 (L) 04/13/2024 1339   HCT 23.9 (L) 06/05/2024 0605   HCT 39.9 04/13/2024 1339   PLT 150 06/05/2024 0605   PLT 196 04/13/2024 1339   MCV 83.6 06/05/2024 0605   MCV 84 04/13/2024 1339   MCH 26.9 06/05/2024 0605   MCHC 32.2 06/05/2024 0605   RDW 16.1 (H) 06/05/2024 0605   RDW 15.8 (H) 04/13/2024 1339   LYMPHSABS 1.0 04/13/2024 1339   MONOABS 0.5 08/12/2017 1327   EOSABS 0.3 04/13/2024 1339   BASOSABS 0.0 04/13/2024 1339   COAG:  Lab Results  Component Value Date   INR 1.12 07/18/2011   INR 1.61 (H) 07/16/2011   INR 1.14 07/16/2011    BMP:     Latest Ref Rng & Units 06/05/2024    6:05 AM 06/04/2024    4:03 AM 06/02/2024    1:53 PM  BMP  Glucose 70 - 99 mg/dL 85  70  897   BUN 8 - 23 mg/dL 8  12  17    Creatinine 0.61 - 1.24 mg/dL 8.63  8.78  8.60   Sodium 135 - 145 mmol/L 138  137  143   Potassium 3.5 - 5.1 mmol/L 4.1  3.5  3.8   Chloride 98 - 111 mmol/L 106  107  107   CO2 22 - 32 mmol/L 25  23  24    Calcium 8.9  - 10.3 mg/dL 7.9  8.0  8.6     HEPATIC:     Latest Ref Rng & Units 06/02/2024    1:53 PM 02/27/2024   11:13 AM 06/11/2023   11:21 AM  Hepatic Function  Total Protein 6.5 - 8.1 g/dL 6.5  6.6  6.9   Albumin 3.5 - 5.0 g/dL 3.5  4.0  4.1   AST 15 - 41 U/L 18  15  14    ALT 0 - 44 U/L 11  10  8    Alk Phosphatase 38 - 126 U/L 59  71  70   Total Bilirubin 0.0 - 1.2 mg/dL 0.9  1.0  0.6     CARDIAC:  Lab Results  Component Value Date   CKTOTAL 777 (H) 07/16/2011   CKMB 8.9 (HH) 07/16/2011   TROPONINI <0.30 07/16/2011      Imaging: I personally reviewed and interpreted the available labs, imaging and endoscopic files.   Assessment/Plan: 83 year old male with history of prostate cancer, hypothyroidism, hypertension presented with hematochezia.  Given dropping hemoglobin down to 8.7, the patient underwent bidirectional endoscopy yesterday.  EGD was completely normal besides presence of hiatal hernia.  Colonoscopy showed presence of diverticular bleeding which was clipped x 1, 1 small polyp was removed from the ascending colon, there was presence of internal and external hemorrhoids.  Since procedure, the patient has remained hemodynamically stable without further drop in hemoglobin after transfusion.  No more episodes of hematochezia.  It appears that his diverticular bleeding has resolved after endoscopic intervention.  Will recommend continuing high-fiber diet and avoiding NSAIDs.  He will need to follow-up in the GI clinic upon discharge.  - High-fiber diet - Avoid NSAIDs - Patient will follow up in GI clinic in 3-4 weeks. - GI service will sign-off, please call us  back if you have any more questions.  Paul Fortune, MD Gastroenterology and Hepatology [facility]astroenter[facility]

## 2024-06-05 NOTE — Telephone Encounter (Signed)
 Hi [name],  Can you please schedule a follow up appointment for this patient in 3-4 weeks with Dr. Alita Irwin or any of the APPs?  Thanks,  Samantha Cress, MD Gastroenterology and Hepatology [facility] Gastroenterology

## 2024-06-05 NOTE — Discharge Instructions (Addendum)

## 2024-06-06 LAB — BPAM RBC
Blood Product Expiration Date: 202509172359
ISSUE DATE / TIME: 202508230002
Unit Type and Rh: 5100

## 2024-06-06 LAB — TYPE AND SCREEN
ABO/RH(D): O POS
Antibody Screen: NEGATIVE
Unit division: 0

## 2024-06-07 ENCOUNTER — Ambulatory Visit: Payer: Self-pay | Admitting: Gastroenterology

## 2024-06-07 ENCOUNTER — Telehealth: Payer: Self-pay

## 2024-06-07 LAB — SURGICAL PATHOLOGY

## 2024-06-07 NOTE — Transitions of Care (Post Inpatient/ED Visit) (Signed)
   06/07/2024  Name: [name] MRN: [id number] DOB: 1941/08/27  Today's TOC FU Call Status: Today's TOC FU Call Status:: Unsuccessful Call (2nd Attempt) Unsuccessful Call (1st Attempt) Date:  (Home number) Unsuccessful Call (2nd Attempt) Date: 06/07/24 (Cell number)  Attempted to reach the patient regarding the most recent Inpatient/ED visit.  Follow Up Plan: Additional outreach attempts will be made to reach the patient to complete the Transitions of Care (Post Inpatient/ED visit) call.    Bing Edison MSN, RN RN Case Sales executive Health  [facility] Office Hours M-F [phone] Direct Dial: [phone] Main Phone [phone]  Fax: [phone] .com

## 2024-06-07 NOTE — Transitions of Care (Post Inpatient/ED Visit) (Signed)
   06/07/2024  Name: Leelynn Whetsel MRN: [id number] DOB: 1940-11-10  Today's TOC FU Call Status: Today's TOC FU Call Status:: Unsuccessful Call (1st Attempt) Unsuccessful Call (1st Attempt) Date: 06/07/24  Attempted to reach the patient regarding the most recent Inpatient/ED visit.  Follow Up Plan: Additional outreach attempts will be made to reach the patient to complete the Transitions of Care (Post Inpatient/ED visit) call.    Bing Edison MSN, RN RN Case Sales executive Health  [facility] Office Hours M-F [phone] Direct Dial: [phone] Main Phone [phone]  Fax: [phone] [email or web address]

## 2024-06-08 ENCOUNTER — Encounter (HOSPITAL_COMMUNITY): Admission: RE | Payer: Self-pay | Source: Home / Self Care

## 2024-06-08 ENCOUNTER — Ambulatory Visit (HOSPITAL_COMMUNITY): Admission: RE | Admit: 2024-06-08 | Source: Home / Self Care | Admitting: Gastroenterology

## 2024-06-08 ENCOUNTER — Telehealth: Payer: Self-pay

## 2024-06-08 SURGERY — COLONOSCOPY
Anesthesia: Choice

## 2024-06-08 NOTE — Transitions of Care (Post Inpatient/ED Visit) (Signed)
 Today's TOC FU Call Status: Today's TOC FU Call Status:: Successful TOC FU Call Completed TOC FU Call Complete Date: 06/08/24 Patient's Name and Date of Birth confirmed.  Background: Recent discharge from hospital for acute onset rectal bleeding.  Past medical history per DC Summary: PMH of chronic systolic CHF (EF 59-54% in 06/2023), non ischemic cardiomyopathy felt to be due to PVCs, post thyroidectomy hypothyroidism, HLD, HTN, prediabetes, prostate cancer s/p XRT, BPH  Transition Care Management Follow-up Telephone Call Date of Discharge: 06/05/24 [facility] Type of Discharge: Inpatient Admission How have you been since you were released from the hospital?: Better Any questions or concerns?: No  Items Reviewed: Did you receive and understand the discharge instructions provided?: Yes Any new allergies since your discharge?: No Dietary orders reviewed?: Yes Type of Diet Ordered:: Low NA heart health--back on solids foods at home. Do you have support at home?: Yes People in Home [RPT]: spouse Name of Support/Comfort Primary Source: [place], Flight (Spouse)  [phone] (Home Phone)  Medications Reviewed Today: Medications Reviewed Today     Reviewed by Carolee Heron NOVAK, RN (Case Manager) on 06/08/24 at 1611  Med List Status: <None>   Medication Order Taking? Sig Documenting Provider Last Dose Status Informant  amiodarone  (PACERONE ) 200 MG tablet [id number] Yes Take 200 mg once daily except do not take on Sundays. Johnson Laymon HERO, PA-C  Active Self, Pharmacy Records  amLODipine  (NORVASC ) 10 MG tablet [id number]  TAKE ONE TABLET BY MOUTH ONCE DAILY. Antonetta Rollene BRAVO, MD  Active Self, Pharmacy Records  aspirin  EC 81 MG tablet [id number] Yes Take 1 tablet (81 mg total) by mouth daily. Antonetta Rollene BRAVO, MD  Active Self, Pharmacy Records  cetirizine  (ZYRTEC ) 10 MG tablet [id number] Yes TAKE 1 TABLET EVERY DAY Antonetta Rollene BRAVO, MD  Active Self, Pharmacy  Records  dapagliflozin  propanediol (FARXIGA ) 10 MG TABS tablet [id number] Yes Take 1 tablet (10 mg total) by mouth daily before breakfast. Antonetta Rollene BRAVO, MD  Active Self, Pharmacy Records  EPINEPHrine  0.3 mg/0.3 mL IJ SOAJ injection [id number] Yes Inject 0.3 mLs (0.3 mg total) into the muscle as needed for anaphylaxis. Antonetta Rollene BRAVO, MD  Active Self, Pharmacy Records  levothyroxine  (SYNTHROID ) 100 MCG tablet [id number] Yes Take 100 mcg by mouth daily. [provider]  Active Self, Pharmacy Records           Med Note (WARD, ANGELICA G   Wed Jun 02, 2024  4:39 PM) Pt takes both 100 mcg and 75 mcg  levothyroxine  (SYNTHROID ) 75 MCG tablet [id number] Yes Take 1 tablet (75 mcg total) by mouth daily. Antonetta Rollene BRAVO, MD  Active Self, Pharmacy Records           Med Note [place], ANGELICA G   Wed Jun 02, 2024  4:40 PM) Pt takes both 100 mcg and 75 mcg   losartan  (COZAAR ) 50 MG tablet [id number]  Take 1 tablet (50 mg total) by mouth daily. Kate Lonni CROME, MD  Active Self, Pharmacy Records  lovastatin  (MEVACOR ) 20 MG tablet [id number] Yes Take 20 mg by mouth at bedtime. [provider]  Active Self, Pharmacy Records  metoprolol  succinate (TOPROL -XL) 50 MG 24 hr tablet [id number] Yes Take 1 tablet (50 mg total) by mouth daily. Antonetta Rollene BRAVO, MD  Active Self, Pharmacy Records  mirtazapine  (REMERON ) 7.5 MG tablet [id number] Yes Take 0.5 tablets (3.75 mg total) by mouth at bedtime. Hongalgi, Anand D, MD  Active   Multiple Vitamin (MULTIVITAMIN WITH MINERALS) TABS tablet  [id number] Yes Take 1 tablet by mouth daily. [provider]  Active Self, Pharmacy Records  spironolactone  (ALDACTONE ) 25 MG tablet [id number] Yes Take 25 mg by mouth daily as needed (swelling). [provider]  Active Self, Pharmacy Records  tadalafil  (CIALIS ) 5 MG tablet [id number] Yes TAKE ONE TABLET BY MOUTH ONCE DAILY.  Patient taking differently: Take 5 mg by mouth at bedtime.   Antonetta Rollene BRAVO, MD  Active Self, Pharmacy Records  tamsulosin  (FLOMAX ) 0.4 MG CAPS capsule [id number] Yes Take 1 capsule (0.4 mg total) by mouth daily. Antonetta Rollene BRAVO, MD  Active Self, Pharmacy Records            Home Care and Equipment/Supplies: Were Home Health Services Ordered?: No Any new equipment or medical supplies ordered?: No  Functional Questionnaire: Do you need assistance with bathing/showering or dressing?: No Do you need assistance with meal preparation?: No Do you need assistance with eating?: No Do you have difficulty maintaining continence: No Do you need assistance with getting out of bed/getting out of a chair/moving?: No Do you have difficulty managing or taking your medications?: No  Follow up appointments reviewed: PCP Follow-up appointment confirmed?: Yes Date of PCP follow-up appointment?: 06/11/24 Follow-up Provider: Rollene Antonetta Specialist [facility] Follow-up appointment confirmed?: Yes Date of Specialist follow-up appointment?: 06/23/24 Follow-Up Specialty Provider:: Cardiology Danelle Birmingham Do you need transportation to your follow-up appointment?: No Do you understand care options if your condition(s) worsen?: Yes-patient verbalized understanding  SDOH Interventions Today    Flowsheet Row Most Recent Value  SDOH Interventions   Food Insecurity Interventions Intervention Not Indicated  Housing Interventions Intervention Not Indicated  Transportation Interventions Intervention Not Indicated  Utilities Interventions Intervention Not Indicated    Goals Addressed             This Visit's Progress    [facility] Transitions of Care (TOC) Care Plan       Problems:  Recent Hospitalization for treatment of Rectal Bleeding.  Knowledge Deficit Related to rectal bleeding and self care management issues related to chronic conditions of heart failure, hypertension  Goal:  Over the next 30 days, the patient will not experience hospital  readmission  Interventions:  Transitions of Care: Doctor Visits  - discussed the importance of doctor visits.  PCP HFU scheduled 06/11/24 Cardiology scheduled for 06/23/24 with Dr Danelle Birmingham.  GI consult referral remains pending.  Discussed rationale for taking blood pressure a few times a week at same time of day.  Reviewed AVS discharge Heart Failure instructions. Discussed rationale for daily weights for HF precautions and recent weight loss patient reported (30 lbs over 5-6 months).  Discussed speaking to PCP about weight loss/nutritional needs.  Discussed speaking to PCP about pending GI consult referral that has not yet been scheduled.  Reviewed follow up appointments, Medications review, allergy review, SDOH,functional status, and Review of systems per phone call/patient reported assessment completed.    Patient Self Care Activities:  Attend all scheduled provider appointments Attend church or other social activities Call pharmacy for medication refills 3-7 days in advance of running out of medications Call provider office for new concerns or questions  Notify RN Care Manager of TOC call rescheduling needs Participate in Transition of Care Program/Attend TOC scheduled calls Perform all self care activities independently  Take medications as prescribed   Call office if I gain more than 2 pounds in one day or 5 pounds in one week track weight in diary use salt in moderation watch for swelling  in feet, ankles and legs every day weigh myself daily for heart failure precautions and recent weight loss.  know when to call the doctor: Reviewed AVS discharge Heart Failure instructions check blood pressure 3 times per week choose a place to take my blood pressure (home, clinic or office, retail store) write blood pressure results in a log or diary learn about high blood pressure take blood pressure log to all doctor appointments keep all doctor appointments take medications for  blood pressure exactly as prescribed Report any recurrent rectal bleeding. Get up from sitting slowly to reduce dizziness/fall risks discussed.   Plan:  Telephone follow up appointment with care management team member scheduled for:  06/16/24 at 3 pm with Heron Edison RN CM.          Bing Edison MSN, RN RN Case Sales executive Health  [facility]Population [facility] Office Hours M-F [phone] Direct Dial: [phone] Main Phone [phone]  Fax: [phone] [email or web address]

## 2024-06-08 NOTE — Patient Instructions (Signed)
 Visit Information  Thank you for taking time to visit with me today. Please don't hesitate to contact me if I can be of assistance to you before our next scheduled telephone appointment.  Our next appointment is by telephone on 06/16/24 at 3 pm  Following is a copy of your care plan:   Goals Addressed             This Visit's Progress    VBCI Transitions of Care (TOC) Care Plan       Problems:  Recent Hospitalization for treatment of Rectal Bleeding.  Knowledge Deficit Related to rectal bleeding and self care management issues related to chronic conditions of heart failure, hypertension  Goal:  Over the next 30 days, the patient will not experience hospital readmission  Interventions:  Transitions of Care: Doctor Visits  - discussed the importance of doctor visits.  PCP HFU scheduled 06/11/24 Cardiology scheduled for 06/23/24 with Dr Danelle Birmingham.  GI consult referral remains pending.  Discussed rationale for taking blood pressure a few times a week at same time of day.  Reviewed AVS discharge Heart Failure instructions. Discussed rationale for daily weights for HF precautions and recent weight loss patient reported (30 lbs over 5-6 months).  Discussed speaking to PCP about weight loss/nutritional needs.  Discussed speaking to PCP about pending GI consult referral that has not yet been scheduled.  Reviewed follow up appointments, Medications review, allergy review, SDOH,functional status, and Review of systems per phone call/patient reported assessment completed.    Patient Self Care Activities:  Attend all scheduled provider appointments Attend church or other social activities Call pharmacy for medication refills 3-7 days in advance of running out of medications Call provider office for new concerns or questions  Notify RN Care Manager of TOC call rescheduling needs Participate in Transition of Care Program/Attend TOC scheduled calls Perform all self care activities  independently  Take medications as prescribed   Call office if I gain more than 2 pounds in one day or 5 pounds in one week track weight in diary use salt in moderation watch for swelling in feet, ankles and legs every day weigh myself daily for heart failure precautions and recent weight loss.  know when to call the doctor: Reviewed AVS discharge Heart Failure instructions check blood pressure 3 times per week choose a place to take my blood pressure (home, clinic or office, retail store) write blood pressure results in a log or diary learn about high blood pressure take blood pressure log to all doctor appointments keep all doctor appointments take medications for blood pressure exactly as prescribed Report any recurrent rectal bleeding. Get up from sitting slowly to reduce dizziness/fall risks discussed.   Plan:  Telephone follow up appointment with care management team member scheduled for:  06/16/24 at 3 pm with Heron Edison RN CM.         The patient verbalized understanding of instructions, educational materials, and care plan provided today and agreed to receive a mailed copy of patient instructions, educational materials, and care plan.   Telephone follow up appointment with care management team member scheduled for: June 16, 2024 at 3 pm.   Please call the care guide team at [phone] if you need to cancel or reschedule your appointment.   Please call the Suicide and Crisis Lifeline: 988 call 1-800-273-TALK (toll free, 24 hour hotline) call 911 if you are experiencing a Mental Health or Behavioral Health Crisis or need someone to talk to.   Bing Edison  MSN, RN RN Case Sales executive Health  [facility]Population [facility] Office Hours M-F [phone] Direct Dial: [phone] Main Phone [phone]  Fax: [phone] [email or web address]

## 2024-06-11 ENCOUNTER — Ambulatory Visit (INDEPENDENT_AMBULATORY_CARE_PROVIDER_SITE_OTHER): Admitting: Family Medicine

## 2024-06-11 ENCOUNTER — Encounter: Payer: Self-pay | Admitting: Family Medicine

## 2024-06-11 VITALS — BP 142/80 | HR 73 | Resp 16 | Ht 67.0 in | Wt 142.1 lb

## 2024-06-11 DIAGNOSIS — I10 Essential (primary) hypertension: Secondary | ICD-10-CM | POA: Diagnosis not present

## 2024-06-11 DIAGNOSIS — K5731 Diverticulosis of large intestine without perforation or abscess with bleeding: Secondary | ICD-10-CM

## 2024-06-11 DIAGNOSIS — I5032 Chronic diastolic (congestive) heart failure: Secondary | ICD-10-CM | POA: Diagnosis not present

## 2024-06-11 DIAGNOSIS — Z7689 Persons encountering health services in other specified circumstances: Secondary | ICD-10-CM

## 2024-06-11 DIAGNOSIS — D126 Benign neoplasm of colon, unspecified: Secondary | ICD-10-CM | POA: Diagnosis not present

## 2024-06-11 DIAGNOSIS — K625 Hemorrhage of anus and rectum: Secondary | ICD-10-CM

## 2024-06-11 DIAGNOSIS — K644 Residual hemorrhoidal skin tags: Secondary | ICD-10-CM

## 2024-06-11 DIAGNOSIS — D500 Iron deficiency anemia secondary to blood loss (chronic): Secondary | ICD-10-CM | POA: Diagnosis not present

## 2024-06-11 DIAGNOSIS — R634 Abnormal weight loss: Secondary | ICD-10-CM | POA: Diagnosis not present

## 2024-06-11 MED ORDER — DAPAGLIFLOZIN PROPANEDIOL 5 MG PO TABS: 5.0000 mg | ORAL_TABLET | Freq: Every day | ORAL | Status: DC

## 2024-06-11 MED ORDER — LOSARTAN POTASSIUM 50 MG PO TABS: ORAL_TABLET | ORAL | 2 refills | Status: AC

## 2024-06-11 NOTE — Progress Notes (Signed)
   [name]     MRN: [id number]      DOB: Dec 14, 1940  Chief Complaint  Patient presents with   Hospitalization Follow-up    Admitted 08/20. Pt is asking for farxiga  samples     HPI [name] is here for [facility] visit following hospitalization  from 8/20  to 06/05/2024 due to lower gI bleed Denies any visible rectal blood since d/c and no black stool , still feels weak, reports poor appetite also, states however that he will make an effort to increase his food intake  ROS Denies recent fever or chills. Denies sinus pressure, nasal congestion, ear pain or sore throat. Denies chest congestion, productive cough or wheezing. Denies chest pains, palpitations and leg swelling .   Denies dysuria, frequency, hesitancy or incontinence. Denies joint pain, swelling and limitation in mobility. Denies headaches, seizures, numbness, or tingling. Denies depression, anxiety or insomnia. Denies skin break down or rash.   PE  BP (!) 142/80   Pulse 73   Resp 16   Ht 5' 7 (1.702 m)   Wt 142 lb 1.9 oz (64.5 kg)   SpO2 97%   BMI 22.26 kg/m   Patient alert and oriented and in no cardiopulmonary distress.  HEENT: No facial asymmetry, EOMI,     Neck supple .  Chest: Clear to auscultation bilaterally.  CVS: S1, S2 no murmurs, no S3.Regular rate.  ABD: Soft non tender.   Ext: No edema  MS: decreased  ROM spine, shoulders, hips and knees.  Skin: Intact, no ulcerations or rash noted.  Psych: Good eye contact, normal affect. Memory intact not anxious or depressed appearing.  CNS: CN 2-12 intact, power,  normal throughout.no focal deficits noted.   Assessment & Plan  Rectal bleed Resolved , no visible rectal blood noted and no melena  External hemorrhoid Noted at  colonoscopy in 05/2024  Tubular adenoma of colon N o polyps seen in 05/2024  Weight loss, abnormal Plans to intentionally increase oral intake  Hemorrhage of sigmoid colon due to diverticulosis Resolved  while in hospitl and no visible bleeding since d/c  Heart failure with improved ejection fraction (HFimpEF) (HCC) Farxiga  samples provided  Encounter for support and coordination of transition of care Patient in for follow up of recent hospitalization.and TOC visit Discharge summary, and laboratory and radiology data are reviewed, and any questions or concerns  are discussed. Specific issues requiring follow up are specifically addressed.   Essential hypertension R[place]esume losartan  at lower dose  half tyab daily

## 2024-06-11 NOTE — Assessment & Plan Note (Signed)
 Resolved , no visible rectal blood noted and no melena

## 2024-06-11 NOTE — Patient Instructions (Signed)
 Pls reschedule annual exam to January at checkout  CBC, bmp and EGFR today  ONLY TAKE LOSARTAN  50 mg HALF tablet daily for blood pressure  Ensure 1 can daily as able but certainly start eating breakfast!  Farxiga  5 mg samples available take TWO DAILY TO stay on  YOUR DOSE OF 10 MG DAILY  Please stay OFF RIDING MOWER / TRACTOR AND AWAY FROM THE DITCH!  Thanks for choosing [facility], we consider it a privelige to serve you.

## 2024-06-12 LAB — CBC WITH DIFFERENTIAL/PLATELET
Basophils Absolute: 0 x10E3/uL (ref 0.0–0.2)
Basos: 1 %
EOS (ABSOLUTE): 0.5 x10E3/uL — ABNORMAL HIGH (ref 0.0–0.4)
Eos: 10 %
Hematocrit: 30 % — ABNORMAL LOW (ref 37.5–51.0)
Hemoglobin: 9 g/dL — ABNORMAL LOW (ref 13.0–17.7)
Immature Grans (Abs): 0 x10E3/uL (ref 0.0–0.1)
Immature Granulocytes: 0 %
Lymphocytes Absolute: 0.9 x10E3/uL (ref 0.7–3.1)
Lymphs: 20 %
MCH: 26 pg — ABNORMAL LOW (ref 26.6–33.0)
MCHC: 30 g/dL — ABNORMAL LOW (ref 31.5–35.7)
MCV: 87 fL (ref 79–97)
Monocytes Absolute: 0.4 x10E3/uL (ref 0.1–0.9)
Monocytes: 8 %
Neutrophils Absolute: 2.7 x10E3/uL (ref 1.4–7.0)
Neutrophils: 61 %
Platelets: 269 x10E3/uL (ref 150–450)
RBC: 3.46 x10E6/uL — ABNORMAL LOW (ref 4.14–5.80)
RDW: 14.6 % (ref 11.6–15.4)
WBC: 4.5 x10E3/uL (ref 3.4–10.8)

## 2024-06-12 LAB — BMP8+EGFR
BUN/Creatinine Ratio: 9 — ABNORMAL LOW (ref 10–24)
BUN: 13 mg/dL (ref 8–27)
CO2: 22 mmol/L (ref 20–29)
Calcium: 8.9 mg/dL (ref 8.6–10.2)
Chloride: 106 mmol/L (ref 96–106)
Creatinine, Ser: 1.52 mg/dL — ABNORMAL HIGH (ref 0.76–1.27)
Glucose: 98 mg/dL (ref 70–99)
Potassium: 4.7 mmol/L (ref 3.5–5.2)
Sodium: 144 mmol/L (ref 134–144)
eGFR: 45 mL/min/1.73 — ABNORMAL LOW (ref >59)

## 2024-06-12 NOTE — Anesthesia Postprocedure Evaluation (Signed)
 Anesthesia Post Note  Patient: [name]  Procedure(s) Performed: COLONOSCOPY EGD (ESOPHAGOGASTRODUODENOSCOPY)  Patient location during evaluation: Phase II Anesthesia Type: MAC Level of consciousness: awake Pain management: pain level controlled Vital Signs Assessment: post-procedure vital signs reviewed and stable Respiratory status: spontaneous breathing and respiratory function stable Cardiovascular status: blood pressure returned to baseline and stable Postop Assessment: no headache and no apparent nausea or vomiting Anesthetic complications: no Comments: Late entry   No notable events documented.   Last Vitals:  Vitals:   06/05/24 0348 06/05/24 1425  BP: 119/77 128/80  Pulse: 76 85  Resp: 18 20  Temp: 37.2 C 36.7 C  SpO2: 96% 96%    Last Pain:  Vitals:   06/05/24 1425  TempSrc: Oral  PainSc:                  Yvonna JINNY Bosworth

## 2024-06-13 ENCOUNTER — Ambulatory Visit: Payer: Self-pay | Admitting: Family Medicine

## 2024-06-13 DIAGNOSIS — Z7689 Persons encountering health services in other specified circumstances: Secondary | ICD-10-CM | POA: Insufficient documentation

## 2024-06-13 DIAGNOSIS — K644 Residual hemorrhoidal skin tags: Secondary | ICD-10-CM | POA: Insufficient documentation

## 2024-06-13 DIAGNOSIS — K5731 Diverticulosis of large intestine without perforation or abscess with bleeding: Secondary | ICD-10-CM | POA: Insufficient documentation

## 2024-06-13 NOTE — Assessment & Plan Note (Signed)
 Resolved while in hospitl and no visible bleeding since d/c

## 2024-06-13 NOTE — Assessment & Plan Note (Signed)
Patient in for follow up of recent hospitalization.and TOC visit Discharge summary, and laboratory and radiology data are reviewed, and any questions or concerns  are discussed. Specific issues requiring follow up are specifically addressed.

## 2024-06-13 NOTE — Assessment & Plan Note (Signed)
 R[place]es[place] losartan  at lower dose  half tyab daily

## 2024-06-13 NOTE — Assessment & Plan Note (Signed)
 Plans to intentionally increase oral intake

## 2024-06-13 NOTE — Assessment & Plan Note (Signed)
 N o polyps seen in 05/2024

## 2024-06-13 NOTE — Assessment & Plan Note (Signed)
 Noted at  colonoscopy in 05/2024

## 2024-06-13 NOTE — Assessment & Plan Note (Signed)
 Farxiga  samples provided

## 2024-06-16 ENCOUNTER — Other Ambulatory Visit: Payer: Self-pay

## 2024-06-16 NOTE — Patient Instructions (Signed)
 Visit Information  Thank you for taking time to visit with me today. Please don't hesitate to contact me if I can be of assistance to you before our next scheduled telephone appointment.  Our next appointment is by telephone on 06/23/24 at 3 pm  Following is a copy of your care plan:   Goals Addressed             This Visit's Progress    VBCI Transitions of Care (TOC) Care Plan   On track    Problems:  Recent Hospitalization for treatment of Rectal Bleeding.  Knowledge Deficit Related to rectal bleeding and self care management issues related to chronic conditions of heart failure, hypertension Patient reports no further rectal bleeding.   Goal:  Over the next 30 days, the patient will not experience hospital readmission  Interventions:  Transitions of Care: Doctor Visits  - discussed the importance of doctor visits.  PCP HFU scheduled 06/11/24: Completed, reviewed with patient, needs to call to get labwork results.  Cardiology scheduled for 06/23/24 with Dr Danelle Birmingham.  Pending, reviewed with patient.  GI consult referral scheduled, reviewed with patient.   Heart Failure Interventions:  Reviewed AVS discharge Heart Failure instructions. Discussed rationale for taking blood pressure a few times a week at same time of day.  Discussed rationale for daily weights for HF precautions and recent weight loss patient reported (30 lbs over 5-6 months).  Appetite improving, states I am eating well and feel stronger.  Reviewed follow up appointments, Medications review, allergy review, SDOH,functional status, and Review of systems per phone call/patient reported assessment completed.  To start taking blood pressure when spouse takes theirs along with daily weights.   Patient Self Care Activities:  Attend all scheduled provider appointments Attend church or other social activities Call pharmacy for medication refills 3-7 days in advance of running out of medications Call provider  office for new concerns or questions  Notify RN Care Manager of TOC call rescheduling needs Participate in Transition of Care Program/Attend TOC scheduled calls Perform all self care activities independently  Take medications as prescribed   Call office if I gain more than 2 pounds in one day or 5 pounds in one week track weight in diary use salt in moderation watch for swelling in feet, ankles and legs every day weigh myself daily for heart failure precautions and recent weight loss.  know when to call the doctor: Reviewed AVS discharge Heart Failure instructions check blood pressure 3 times per week choose a place to take my blood pressure (home, clinic or office, retail store) write blood pressure results in a log or diary learn about high blood pressure take blood pressure log to all doctor appointments keep all doctor appointments take medications for blood pressure exactly as prescribed Report any recurrent rectal bleeding. Get up from sitting slowly to reduce dizziness/fall risks discussed.   Plan:  Telephone follow up appointment with care management team member scheduled for:  06/23/24 at 3 pm with Heron Edison RN CM.       The patient has been provided with contact information for the care management team and has been advised to call with any non urgent or emergency health-related questions or concerns. The patient is directed to their insurance card regarding availability of benefits coverage.   The patient verbalized understanding of instructions, educational materials, and care plan provided today and agreed to receive a mailed copy of patient instructions, educational materials, and care plan.  MyChart Help Desk [phone]  given to patient.   Telephone follow up appointment with care management team member scheduled for: 06/23/24 at 3pm.   Please call the care guide team at [phone] if you need to cancel or reschedule your appointment.   MyChart Help Desk  [phone]  Please call the Suicide and Crisis Lifeline: 988 call 1-800-273-TALK (toll free, 24 hour hotline) call 911 if you are experiencing a Mental Health or Behavioral Health Crisis or need someone to talk to.   Bing Edison MSN, RN RN Case Sales executive Health  VBCI-Population Health Office Hours M-F [phone] Direct Dial: [phone] Main Phone [phone]  Fax: [phone] [email or web address]

## 2024-06-16 NOTE — Transitions of Care (Post Inpatient/ED Visit) (Signed)
 Transition of Care week 2  Visit Note  06/16/2024  Name: [name] MRN: [id number]          DOB: 09-20-1941  Situation: Patient enrolled in [facility] 30-day program. Visit completed with patient by telephone.   Background:   Initial Transition Care Management Follow-up Telephone Call    Past Medical History:  Diagnosis Date   Allergic rhinitis, seasonal    BPH (benign prostatic hypertrophy)    Erectile dysfunction    Heart failure with improved ejection fraction (HFimpEF) (HCC)    a. EF 25-30% by echo in 04/2022 with cath showing normal cors b. EF at 30-35% in 10/2022 c. EF 40-45% in 06/2023 and cardiomyopathy felt to be due to PVC's   History of kidney stones    Hx of thyroidectomy    Hyperlipidemia    Hypertension    Hypothyroidism    Pre-diabetes    Prostate cancer (HCC) 2011   treated with radiation   PVC's (premature ventricular contractions)    Urosepsis 07/2011   ICU admission [facility]    Assessment: Patient Reported Symptoms: Cognitive Cognitive Status: No symptoms reported, Alert and oriented to person, place, and time, Insightful and able to interpret abstract concepts, Normal speech and language skills      Neurological Neurological Review of Symptoms: No symptoms reported    HEENT HEENT Symptoms Reported: No symptoms reported      Cardiovascular Cardiovascular Symptoms Reported: No symptoms reported Does patient have uncontrolled Hypertension?: No Cardiovascular Management Strategies: Medication therapy, Routine screening, Medical device Cardiovascular Comment: Encouraged to weigh daily and take blood pressure at 3x/week and log to take to provider.  Respiratory Respiratory Symptoms Reported: No symptoms reported    Endocrine Endocrine Symptoms Reported: No symptoms reported Is patient diabetic?:  (pre-diabetes on medical history)    Gastrointestinal Gastrointestinal Symptoms Reported: No symptoms reported Gastrointestinal Management Strategies:  Exercise, Coping strategies, Medication therapy Gastrointestinal Self-Management Outcome: 4 (good) Gastrointestinal Comment: Appetite improving, eatin well, no bloody stools, normal BM after initial post discharge BM 5 days later.    Genitourinary Genitourinary Symptoms Reported: No symptoms reported Genitourinary Management Strategies: Medication therapy  Integumentary Integumentary Symptoms Reported: No symptoms reported    Musculoskeletal Musculoskelatal Symptoms Reviewed: Joint pain Musculoskeletal Management Strategies: Activity, Exercise, Medication therapy, Routine screening, Coping strategies Musculoskeletal Self-Management Outcome: 4 (good) Falls in the past year?: Yes Patient at Risk for Falls Due to: Medication side effect, Orthopedic patient Fall risk Follow up: Falls prevention discussed  Psychosocial Psychosocial Symptoms Reported: No symptoms reported         There were no vitals filed for this visit.  Medications Reviewed Today     Reviewed by Carolee Heron NOVAK, RN (Case Manager) on 06/16/24 at 1516  Med List Status: <None>   Medication Order Taking? Sig Documenting Provider Last Dose Status Informant  amiodarone  (PACERONE ) 200 MG tablet [id number] Yes Take 200 mg once daily except do not take on Sundays. Johnson Laymon HERO, PA-C  Active Self, Pharmacy Records  aspirin  EC 81 MG tablet [id number] Yes Take 1 tablet (81 mg total) by mouth daily. Antonetta Rollene BRAVO, MD  Active Self, Pharmacy Records  cetirizine  (ZYRTEC ) 10 MG tablet [id number] Yes TAKE 1 TABLET EVERY DAY Antonetta Rollene BRAVO, MD  Active Self, Pharmacy Records  dapagliflozin  propanediol (FARXIGA ) 10 MG TABS tablet [id number] Yes Take 1 tablet (10 mg total) by mouth daily before breakfast. Antonetta Rollene BRAVO, MD  Active Self, Pharmacy Records  dapagliflozin  propanediol (FARXIGA ) 5 MG TABS tablet [id number]  Yes Take 1 tablet (5 mg total) by mouth daily before breakfast. Antonetta Rollene BRAVO, MD  Active   EPINEPHrine   0.3 mg/0.3 mL IJ SOAJ injection [id number] Yes Inject 0.3 mLs (0.3 mg total) into the muscle as needed for anaphylaxis. Antonetta Rollene BRAVO, MD  Active Self, Pharmacy Records  levothyroxine  (SYNTHROID ) 100 MCG tablet [id number] Yes Take 100 mcg by mouth daily. [provider]  Active Self, Pharmacy Records           Med Note (WARD, ANGELICA G   Wed Jun 02, 2024  4:39 PM) Pt takes both 100 mcg and 75 mcg  levothyroxine  (SYNTHROID ) 75 MCG tablet [id number] Yes Take 1 tablet (75 mcg total) by mouth daily. Antonetta Rollene BRAVO, MD  Active Self, Pharmacy Records           Med Note Nora, ANGELICA G   Wed Jun 02, 2024  4:40 PM) Pt takes both 100 mcg and 75 mcg   losartan  (COZAAR ) 50 MG tablet [id number] Yes Take half tablet by mouth once daily for blood pressure Antonetta Rollene BRAVO, MD  Active   lovastatin  (MEVACOR ) 20 MG tablet [id number] Yes Take 20 mg by mouth at bedtime. [provider]  Active Self, Pharmacy Records  metoprolol  succinate (TOPROL -XL) 50 MG 24 hr tablet [id number] Yes Take 1 tablet (50 mg total) by mouth daily. Antonetta Rollene BRAVO, MD  Active Self, Pharmacy Records  mirtazapine  (REMERON ) 7.5 MG tablet [id number] Yes Take 0.5 tablets (3.75 mg total) by mouth at bedtime. Hongalgi, Anand D, MD  Active   Multiple Vitamin (MULTIVITAMIN WITH MINERALS) TABS tablet [id number] Yes Take 1 tablet by mouth daily. [provider]  Active Self, Pharmacy Records  spironolactone  (ALDACTONE ) 25 MG tablet [id number] Yes Take 25 mg by mouth daily as needed (swelling). [provider]  Active Self, Pharmacy Records  tadalafil  (CIALIS ) 5 MG tablet [id number] Yes TAKE ONE TABLET BY MOUTH ONCE DAILY. Antonetta Rollene BRAVO, MD  Active Self, Pharmacy Records  tamsulosin  (FLOMAX ) 0.4 MG CAPS capsule [id number] Yes Take 1 capsule (0.4 mg total) by mouth daily. Antonetta Rollene BRAVO, MD  Active Self, Pharmacy Records            Goals Addressed             This Visit's Progress     VBCI Transitions of Care [facility]) Care Plan   On track    Problems:  Recent Hospitalization for treatment of Rectal Bleeding.  Knowledge Deficit Related to rectal bleeding and self care management issues related to chronic conditions of heart failure, hypertension Patient reports no further rectal bleeding.   Goal:  Over the next 30 days, the patient will not experience hospital readmission  Interventions:  Transitions of Care: Doctor Visits  - discussed the importance of doctor visits.  PCP HFU scheduled 06/11/24: Completed, reviewed with patient, needs to call to get labwork results.  Cardiology scheduled for 06/23/24 with Dr Danelle Birmingham.  Pending, reviewed with patient.  GI consult referral scheduled, reviewed with patient.   Heart Failure Interventions:  Reviewed AVS discharge Heart Failure instructions. Discussed rationale for taking blood pressure a few times a week at same time of day.  Discussed rationale for daily weights for HF precautions and recent weight loss patient reported (30 lbs over 5-6 months).  Appetite improving, states I am eating well and feel stronger.  Reviewed follow up appointments, Medications review, allergy review, SDOH,functional status, and Review of systems per phone call/patient reported assessment completed.  To  start taking blood pressure when spouse takes theirs along with daily weights.   Patient Self Care Activities:  Attend all scheduled provider appointments Attend church or other social activities Call pharmacy for medication refills 3-7 days in advance of running out of medications Call provider office for new concerns or questions  Notify RN Care Manager of TOC call rescheduling needs Participate in Transition of Care Program/Attend TOC scheduled calls Perform all self care activities independently  Take medications as prescribed   Call office if I gain more than 2 pounds in one day or 5 pounds in one week track weight in diary use salt  in moderation watch for swelling in feet, ankles and legs every day weigh myself daily for heart failure precautions and recent weight loss.  know when to call the doctor: Reviewed AVS discharge Heart Failure instructions check blood pressure 3 times per week choose a place to take my blood pressure (home, clinic or office, retail store) write blood pressure results in a log or diary learn about high blood pressure take blood pressure log to all doctor appointments keep all doctor appointments take medications for blood pressure exactly as prescribed Report any recurrent rectal bleeding. Get up from sitting slowly to reduce dizziness/fall risks discussed.   Plan:  Telephone follow up appointment with care management team member scheduled for:  06/23/24 at 3 pm with Heron Edison RN CM.         Recommendation:   Specialty provider follow-up on 06/23/24 Dr. Waddell Continue Current Plan of Care  Follow Up Plan:   Telephone follow-up in 1 week. 06/23/24 at 3 pm   Bing Edison MSN, RN RN Case Manager [facility] Health  VBCI-Population Health Office Hours M-F [phone] Direct Dial: [phone] Main Phone [phone]  Fax: [phone] [email or web address]

## 2024-06-17 ENCOUNTER — Telehealth: Payer: Self-pay | Admitting: Family Medicine

## 2024-06-17 NOTE — Telephone Encounter (Signed)
Mailed results to him.

## 2024-06-17 NOTE — Telephone Encounter (Signed)
 Patient asked for Dr Antonetta nurse to return his call, he came into office waited 20 minutes so he asked if nurse could just give him a call back.

## 2024-06-21 ENCOUNTER — Encounter (INDEPENDENT_AMBULATORY_CARE_PROVIDER_SITE_OTHER): Payer: Self-pay | Admitting: *Deleted

## 2024-06-21 NOTE — Progress Notes (Signed)
 Patient result letter mailed

## 2024-06-22 ENCOUNTER — Telehealth: Payer: Self-pay

## 2024-06-22 NOTE — Progress Notes (Signed)
 Ok to make the change thanks

## 2024-06-22 NOTE — Progress Notes (Signed)
   06/22/2024  Patient ID: Ricardo Mickey Scarce, male   DOB: 1941-07-07, 83 y.o.   MRN: [id number]  This patient is appearing on a report for being at risk of failing the adherence measure for identified medications this calendar year.   Medication Adherence Summary (STAR/HEDIS Monitoring): Adherence Category: cholesterol (statin)    Drug Name: LOVASTATIN  20 MG TABLET Last Fill or Sold Date:02/02/2024 Days' Supply: 12 - I called [facility] and confirmed the above last filled date.    Notes: ? Adherence data pulled from pharmacy claims portal Dr. Annemarie. ? Patient was contacted but unable to reach. Left a message asking to return my call.  ? Reviewed barriers to adherence: N/A.   Dorcas Solian, PharmD Clinical Pharmacist Cell: [phone]

## 2024-06-23 ENCOUNTER — Telehealth: Payer: Self-pay

## 2024-06-23 ENCOUNTER — Ambulatory Visit: Attending: Internal Medicine | Admitting: Internal Medicine

## 2024-06-23 VITALS — BP 138/80 | HR 65 | Ht 67.0 in | Wt 147.0 lb

## 2024-06-23 DIAGNOSIS — I10 Essential (primary) hypertension: Secondary | ICD-10-CM

## 2024-06-23 NOTE — Progress Notes (Signed)
 HPI Mr. Byrns returns for ongoing evaluation of PVC's. He has a h/o non-ischemic CM, and has class 1 symtoms on maximal medical therapy. The patient has not had syncope. He does not feel his palpitaitons. He had a 25% burden and I started him on amiodarone .  He has more than one morphology. He has been on a beta blocker. His EF was 25-30% in the setting of the PVC's. He was started on amio and repeat zio shows a burden of less than 1%. His EF most recently improved to 45%. He had a diverticular bleed several weeks ago.  Allergies  Allergen Reactions   Peanut-Containing Drug Products Shortness Of Breath and Swelling   Egg-Derived Products Swelling   Iodine  Swelling   Penicillins Hives, Rash and Other (See Comments)    Severe rash involving mucus membranes or skin necrosis   Shellfish Allergy Swelling     Current Outpatient Medications  Medication Sig Dispense Refill   amiodarone  (PACERONE ) 200 MG tablet Take 200 mg once daily except do not take on Sundays. 180 tablet 3   aspirin  EC 81 MG tablet Take 1 tablet (81 mg total) by mouth daily. 30 tablet 5   dapagliflozin  propanediol (FARXIGA ) 10 MG TABS tablet Take 1 tablet (10 mg total) by mouth daily before breakfast.     dapagliflozin  propanediol (FARXIGA ) 5 MG TABS tablet Take 1 tablet (5 mg total) by mouth daily before breakfast. 56 tablet    EPINEPHrine  0.3 mg/0.3 mL IJ SOAJ injection Inject 0.3 mLs (0.3 mg total) into the muscle as needed for anaphylaxis. 1 each 2   levothyroxine  (SYNTHROID ) 100 MCG tablet Take 100 mcg by mouth daily.     levothyroxine  (SYNTHROID ) 75 MCG tablet Take 1 tablet (75 mcg total) by mouth daily. 90 tablet 3   losartan  (COZAAR ) 50 MG tablet Take half tablet by mouth once daily for blood pressure 30 tablet 2   lovastatin  (MEVACOR ) 20 MG tablet Take 20 mg by mouth at bedtime.     metoprolol  succinate (TOPROL -XL) 50 MG 24 hr tablet Take 1 tablet (50 mg total) by mouth daily. 90 tablet 1   mirtazapine   (REMERON ) 7.5 MG tablet Take 0.5 tablets (3.75 mg total) by mouth at bedtime.     Multiple Vitamin (MULTIVITAMIN WITH MINERALS) TABS tablet Take 1 tablet by mouth daily.     spironolactone  (ALDACTONE ) 25 MG tablet Take 25 mg by mouth daily as needed (swelling).     tadalafil  (CIALIS ) 5 MG tablet TAKE ONE TABLET BY MOUTH ONCE DAILY. 30 tablet 11   tamsulosin  (FLOMAX ) 0.4 MG CAPS capsule Take 1 capsule (0.4 mg total) by mouth daily. 30 capsule 5   cetirizine  (ZYRTEC ) 10 MG tablet TAKE 1 TABLET EVERY DAY (Patient not taking: Reported on 06/23/2024) 90 tablet 10   No current facility-administered medications for this visit.     Past Medical History:  Diagnosis Date   Allergic rhinitis, seasonal    BPH (benign prostatic hypertrophy)    Erectile dysfunction    Heart failure with improved ejection fraction (HFimpEF) (HCC)    a. EF 25-30% by echo in 04/2022 with cath showing normal cors b. EF at 30-35% in 10/2022 c. EF 40-45% in 06/2023 and cardiomyopathy felt to be due to PVC's   History of kidney stones    Hx of thyroidectomy    Hyperlipidemia    Hypertension    Hypothyroidism    Pre-diabetes    Prostate cancer (HCC) 2011   treated  with radiation   PVC's (premature ventricular contractions)    Urosepsis 07/2011   ICU admission [facility]    ROS:   All systems reviewed and negative except as noted in the HPI.   Past Surgical History:  Procedure Laterality Date   COLONOSCOPY N/A 09/03/2013   Procedure: COLONOSCOPY;  Surgeon: Claudis RAYMOND Rivet, MD;  Location[facility];  Service: Endoscopy;  Laterality: N/A;  1225   COLONOSCOPY N/A 11/08/2015   Procedure: COLONOSCOPY;  Surgeon: Claudis RAYMOND Rivet, MD;  Location[facility];  Service: Endoscopy;  Laterality: N/A;  1200   COLONOSCOPY N/A 06/04/2024   Procedure: COLONOSCOPY;  Surgeon: Eartha Angelia Sieving, MD;  Location: [facility];  Service: Gastroenterology;  Laterality: N/A;   ESOPHAGOGASTRODUODENOSCOPY N/A 06/04/2024   Procedure:  EGD (ESOPHAGOGASTRODUODENOSCOPY);  Surgeon: Eartha Angelia, Sieving, MD;  Location: [facility];  Service: Gastroenterology;  Laterality: N/A;   MASS EXCISION N/A 08/15/2017   Procedure: EXCISION SEBACEOUS CYST NECK;  Surgeon: Mavis Anes, MD;  Location: [facility];  Service: General;  Laterality: N/A;   PROSTATE SURGERY     RIGHT/LEFT HEART CATH AND CORONARY ANGIOGRAPHY N/A 04/30/2022   Procedure: RIGHT/LEFT HEART CATH AND CORONARY ANGIOGRAPHY;  Surgeon: [place], Peter M, MD;  Location: [facility] INVASIVE CV LAB;  Service: Cardiovascular;  Laterality: N/A;   THYROIDECTOMY  2003   transurethral resection of thr prostate  2003   ureteral stone extraction  08/2011     Family History  Problem Relation Age of Onset   Stroke Mother    Diabetes Father    Hypertension Father    Kidney disease Brother    Stroke Brother    Diabetes Brother    Gout Brother    Diabetes Brother      Social History   Socioeconomic History   Marital status: Married    Spouse name: Not on file   Number of children: 5   Years of education: Not on file   Highest education level: Not on file  Occupational History   Occupation: retired  Tobacco Use   Smoking status: Never    Passive exposure: Never   Smokeless tobacco: Never  Vaping Use   Vaping status: Never Used  Substance and Sexual Activity   Alcohol use: No    Alcohol/week: 0.0 standard drinks of alcohol   Drug use: No   Sexual activity: Yes  Other Topics Concern   Not on file  Social History Narrative   Not on file   Social Drivers of Health   Financial Resource Strain: Low Risk  (07/30/2023)   Overall Financial Resource Strain (CARDIA)    Difficulty of Paying Living Expenses: Not very hard  Food Insecurity: No Food Insecurity (06/16/2024)   Hunger Vital Sign    Worried About Running Out of Food in the Last Year: Never true    Ran Out of Food in the Last Year: Never true  Transportation Needs: No Transportation Needs (06/16/2024)   PRAPARE -  Administrator, Civil Service (Medical): No    Lack of Transportation (Non-Medical): No  Physical Activity: Insufficiently Active (07/30/2023)   Exercise Vital Sign    Days of Exercise per Week: 3 days    Minutes of Exercise per Session: 20 min  Stress: No Stress Concern Present (07/30/2023)   [employer] of Occupational Health - Occupational Stress Questionnaire    Feeling of Stress : Not at all  Social Connections: Moderately Integrated (06/16/2024)   Social Connection and Isolation Panel  Frequency of Communication with Friends and Family: More than three times a week    Frequency of Social Gatherings with Friends and Family: More than three times a week    Attends Religious Services: More than 4 times per year    Active Member of [employer] or Organizations: No    Attends Banker Meetings: Never    Marital Status: Married  Catering manager Violence: Not At Risk (06/16/2024)   Humiliation, Afraid, Rape, and Kick questionnaire    Fear of Current or Ex-Partner: No    Emotionally Abused: No    Physically Abused: No    Sexually Abused: No     BP 138/80 (BP Location: Left Arm, Patient Position: Sitting, Cuff Size: Normal)   Pulse 65   Ht 5' 7 (1.702 m)   Wt 147 lb (66.7 kg)   SpO2 98%   BMI 23.02 kg/m   Physical Exam:  Well appearing 83 yo man, NAD HEENT: Unremarkable Neck:  No JVD, no thyromegally Lymphatics:  No adenopathy Back:  No CVA tenderness Lungs:  Clear with no wheezes HEART:  Regular rate rhythm, no murmurs, no rubs, no clicks Abd:  soft, positive bowel sounds, no organomegally, no rebound, no guarding Ext:  2 plus pulses, no edema, no cyanosis, no clubbing Skin:  No rashes no nodules Neuro:  CN II through XII intact, motor grossly intact  EKG - NSR  Assess/Plan:  non-ischemic CM - His EF has gone from 30% to 40% and no 45% with reduction of the PVC burden. He will continue GDMT. PVC's -on amio, his PVC burden has gone down to  less than 1%.  His EF has improved. Continue amiodarone . He is on 6 days of amio a week. When he returns in a year I would recommend reducing to 5 days a week.  Dyslipidemia - I asked him to reduce his dose of lovastatin  to 20 mg daily.  Danelle Lamisha Roussell,MD

## 2024-06-23 NOTE — Patient Instructions (Signed)

## 2024-07-05 ENCOUNTER — Encounter (INDEPENDENT_AMBULATORY_CARE_PROVIDER_SITE_OTHER): Payer: Self-pay | Admitting: Gastroenterology

## 2024-07-05 ENCOUNTER — Ambulatory Visit (INDEPENDENT_AMBULATORY_CARE_PROVIDER_SITE_OTHER): Admitting: Gastroenterology

## 2024-07-05 VITALS — BP 141/72 | HR 62 | Temp 97.2°F | Ht 67.0 in | Wt 145.5 lb

## 2024-07-05 DIAGNOSIS — D500 Iron deficiency anemia secondary to blood loss (chronic): Secondary | ICD-10-CM

## 2024-07-05 DIAGNOSIS — Z8719 Personal history of other diseases of the digestive system: Secondary | ICD-10-CM | POA: Diagnosis not present

## 2024-07-05 DIAGNOSIS — Z860100 Personal history of colon polyps, unspecified: Secondary | ICD-10-CM | POA: Diagnosis not present

## 2024-07-05 DIAGNOSIS — R634 Abnormal weight loss: Secondary | ICD-10-CM

## 2024-07-05 MED ORDER — FERROUS SULFATE 325 (65 FE) MG PO TABS: 325.0000 mg | ORAL_TABLET | ORAL | 0 refills | Status: AC

## 2024-07-05 NOTE — Progress Notes (Signed)
 Chadd Tollison Faizan Marthe Dant , M.D. Gastroenterology & Hepatology [facility]enter[facility] [address] [place], [place] [address] Primary Care Physician: Antonetta Rollene BRAVO, MD [address] [place] [address]  Chief Complaint: Iron deficiency anemia, unintentional weight loss  History of Present Illness: [name] is a 83 y.o. male  history of CABG, thyroidectomy  who presents for follow-up after recent hospitalization for acute posthemorrhagic anemia secondary to diverticular bleed. Patient also with iron deficiency anemia and unintentional weight loss  Patient underwent bidirectional endoscopy.EGD was completely normal besides presence of hiatal hernia. Colonoscopy showed presence of diverticular bleeding which was clipped x 1, 1 small polyp was removed from the ascending colon, there was presence of internal and external hemorrhoids.  Given unintentional weight loss patient had a CT abdomen pelvis which was negative for any acute findings or malignancy .patient reports that he is feeling well no further blood per rectum having regular bowel movements The patient denies having any nausea, vomiting, fever, chills, hematochezia, melena, hematemesis, abdominal distention, abdominal pain, diarrhea, jaundice, pruritus.  Patient reports continued gradual weight loss.  Only has a good appetite  Labs from 04/2024 ferritin 19  Last EGD/Last Colonoscopy:05/2024  EGD FINDINGS: - 1 cm hiatal hernia.  - Normal stomach.  - Normal examined duodenum.    COLONOSCOPY FINDINGS: - One 4 mm polyp in the ascending colon, removed with a cold snare.  Resected and retrieved.  -Scattered medium-mouthed diverticula were found in the sigmoid colon.  There was evidence of recent bleeding from the diverticular opening as there was presence of an adhered clot to a diverticulum. This was irrigated and there was presence of a slowly oozing vessel.  For hemostasis, one  hemostatic clip was successfully placed (MR safe).  Clip manufacturer: [employer].  There was no bleeding at the end of the procedure.  -Non-bleeding internal hemorrhoids.    Note: diverticular bleeding is source  Past Medical History: Past Medical History:  Diagnosis Date   Allergic rhinitis, seasonal    BPH (benign prostatic hypertrophy)    Erectile dysfunction    Heart failure with improved ejection fraction (HFimpEF) (HCC)    a. EF 25-30% by echo in 04/2022 with cath showing normal cors b. EF at 30-35% in 10/2022 c. EF 40-45% in 06/2023 and cardiomyopathy felt to be due to PVC's   History of kidney stones    Hx of thyroidectomy    Hyperlipidemia    Hypertension    Hypothyroidism    Pre-diabetes    Prostate cancer (HCC) 2011   treated with radiation   PVC's (premature ventricular contractions)    Urosepsis 07/2011   ICU admission [facility]    Past Surgical History: Past Surgical History:  Procedure Laterality Date   COLONOSCOPY N/A 09/03/2013   Procedure: COLONOSCOPY;  Surgeon: Claudis RAYMOND Rivet, MD;  [facility];  Service: Endoscopy;  Laterality: N/A;  1225   COLONOSCOPY N/A 11/08/2015   Procedure: COLONOSCOPY;  Surgeon: Claudis RAYMOND Rivet, MD;  [facility];  Service: Endoscopy;  Laterality: N/A;  1200   COLONOSCOPY N/A 06/04/2024   Procedure: COLONOSCOPY;  Surgeon: Eartha Angelia Sieving, MD;  [facility];  Service: Gastroenterology;  Laterality: N/A;   ESOPHAGOGASTRODUODENOSCOPY N/A 06/04/2024   Procedure: EGD (ESOPHAGOGASTRODUODENOSCOPY);  Surgeon: Eartha Angelia, Sieving, MD;  [facility];  Service: Gastroenterology;  Laterality: N/A;   MASS EXCISION N/A 08/15/2017   Procedure: EXCISION SEBACEOUS CYST NECK;  Surgeon: Mavis Anes,  MD;  Location: [facility];  Service: General;  Laterality: N/A;   PROSTATE SURGERY     RIGHT/LEFT HEART CATH AND CORONARY ANGIOGRAPHY N/A 04/30/2022   Procedure: RIGHT/LEFT HEART CATH AND CORONARY  ANGIOGRAPHY;  Surgeon: Swazil[place], Peter M, MD;  Location: [facility] INVASIVE CV LAB;  Service: Cardiovascular;  Laterality: N/A;   THYROIDECTOMY  2003   transurethral resection of thr prostate  2003   ureteral stone extraction  08/2011    Family History: Family History  Problem Relation Age of Onset   Stroke Mother    Diabetes Father    Hypertension Father    Kidney disease Brother    Stroke Brother    Diabetes Brother    Gout Brother    Diabetes Brother     Social History: Social History   Tobacco Use  Smoking Status Never   Passive exposure: Never  Smokeless Tobacco Never   Social History   Substance and Sexual Activity  Alcohol Use No   Alcohol/week: 0.0 standard drinks of alcohol   Social History   Substance and Sexual Activity  Drug Use No    Allergies: Allergies  Allergen Reactions   Peanut-Containing Drug Products Shortness Of Breath and Swelling   Egg-Derived Products Swelling   Iodine  Swelling   Penicillins Hives, Rash and Other (See Comments)    Severe rash involving mucus membranes or skin necrosis   Shellfish Allergy Swelling    Medications: Current Outpatient Medications  Medication Sig Dispense Refill   amiodarone  (PACERONE ) 200 MG tablet Take 200 mg once daily except do not take on Sundays. 180 tablet 3   aspirin  EC 81 MG tablet Take 1 tablet (81 mg total) by mouth daily. 30 tablet 5   cetirizine  (ZYRTEC ) 10 MG tablet TAKE 1 TABLET EVERY DAY 90 tablet 10   dapagliflozin  propanediol (FARXIGA ) 10 MG TABS tablet Take 1 tablet (10 mg total) by mouth daily before breakfast.     EPINEPHrine  0.3 mg/0.3 mL IJ SOAJ injection Inject 0.3 mLs (0.3 mg total) into the muscle as needed for anaphylaxis. 1 each 2   levothyroxine  (SYNTHROID ) 75 MCG tablet Take 1 tablet (75 mcg total) by mouth daily. 90 tablet 3   losartan  (COZAAR ) 50 MG tablet Take half tablet by mouth once daily for blood pressure 30 tablet 2   lovastatin  (MEVACOR ) 20 MG tablet Take 20 mg by mouth at  bedtime.     metoprolol  succinate (TOPROL -XL) 50 MG 24 hr tablet Take 1 tablet (50 mg total) by mouth daily. 90 tablet 1   mirtazapine  (REMERON ) 7.5 MG tablet Take 0.5 tablets (3.75 mg total) by mouth at bedtime.     Multiple Vitamin (MULTIVITAMIN WITH MINERALS) TABS tablet Take 1 tablet by mouth daily.     spironolactone  (ALDACTONE ) 25 MG tablet Take 25 mg by mouth daily as needed (swelling).     tadalafil  (CIALIS ) 5 MG tablet TAKE ONE TABLET BY MOUTH ONCE DAILY. 30 tablet 11   tamsulosin  (FLOMAX ) 0.4 MG CAPS capsule Take 1 capsule (0.4 mg total) by mouth daily. 30 capsule 5   levothyroxine  (SYNTHROID ) 100 MCG tablet Take 100 mcg by mouth daily. (Patient not taking: Reported on 07/05/2024)     No current facility-administered medications for this visit.    Review of Systems: GENERAL: negative for malaise, night sweats HEENT: No changes in hearing or vision, no nose bleeds or other nasal problems. NECK: Negative for lumps, goiter, pain and significant neck swelling RESPIRATORY: Negative for cough, wheezing CARDIOVASCULAR: Negative for chest  pain, leg swelling, palpitations, orthopnea GI: SEE HPI MUSCULOSKELETAL: Negative for joint pain or swelling, back pain, and muscle pain. SKIN: Negative for lesions, rash HEMATOLOGY Negative for prolonged bleeding, bruising easily, and swollen nodes. ENDOCRINE: Negative for cold or heat intolerance, polyuria, polydipsia and goiter. NEURO: negative for tremor, gait imbalance, syncope and seizures. The remainder of the review of systems is noncontributory.   Physical Exam: BP (!) 160/81   Pulse 62   Temp (!) 97.2 F (36.2 C)   Ht 5' 7 (1.702 m)   Wt 145 lb 8 oz (66 kg)   BMI 22.79 kg/m  GENERAL: The patient is AO x3, in no acute distress. HEENT: Head is normocephalic and atraumatic. EOMI are intact. Mouth is well hydrated and without lesions. NECK: Supple. No masses LUNGS: Clear to auscultation. No presence of rhonchi/wheezing/rales. Adequate  chest expansion HEART: RRR, normal s1 and s2. ABDOMEN: Soft, nontender, no guarding, no peritoneal signs, and nondistended. BS +. No masses.   Imaging/Labs: as above     Latest Ref Rng & Units 06/11/2024    9:00 AM 06/05/2024   11:46 AM 06/05/2024    6:05 AM  CBC  WBC 3.4 - 10.8 x10E3/uL 4.5  4.6  3.7   Hemoglobin 13.0 - 17.7 g/dL 9.0  8.5  7.7   Hematocrit 37.5 - 51.0 % 30.0  26.0  23.9   Platelets 150 - 450 x10E3/uL 269  168  150    Lab Results  Component Value Date   IRON 58 04/13/2024   FERRITIN 19 (L) 04/13/2024    I personally reviewed and interpreted the available labs, imaging and endoscopic files.  CT Abdomen and Pelvis  IMPRESSION: No acute findings. No evidence of malignancy.   Stable mildly enlarged prostate, with prior TURP defect, and findings of chronic bladder obstruction.   Cholelithiasis. No radiographic evidence of cholecystitis.   Colonic diverticulosis, without radiographic evidence of diverticulitis.  Impression and Plan:  [name] is a 83 y.o. male  history of CABG, thyroidectomy  who presents for follow-up after recent hospitalization for acute posthemorrhagic anemia secondary to diverticular bleed. Patient also with iron deficiency anemia and unintentional weight loss  # Iron deficiency anemia # Unintentional weight loss # Diverticular bleed  Patient underwent bidirectional endoscopy.EGD was completely normal besides presence of hiatal hernia. Colonoscopy showed presence of diverticular bleeding which was clipped x 1, 1 small polyp was removed from the ascending colon, there was presence of internal and external hemorrhoids.  Given unintentional weight loss patient had a CT abdomen pelvis which was negative for any acute findings or malignancy  I reiterated importance of having a regular bowel movement and avoiding constipation as well as avoiding NSAIDs   Will start patient on p.o. iron every 48 hours with MiraLAX  daily to avoid  constipation Avoid NSAIDs Hematology referral and depending on iron supplementation response will reassess need for video capsule endoscopy in future  all questions were answered.      Yexalen Deike Faizan Hibba Schram, MD Gastroenterology and Hepatology [facility] Gastroenterology   This chart has been completed using [facility] Dictation software, and while attempts have been made to ensure accuracy , certain words and phrases may not be transcribed as intended

## 2024-07-05 NOTE — Patient Instructions (Signed)
 It was very nice to meet you today, as dicussed with will plan for the following :  1)iron every other day and miralax  daily   2) see hematologist

## 2024-07-14 ENCOUNTER — Other Ambulatory Visit: Payer: Self-pay | Admitting: Family Medicine

## 2024-07-15 ENCOUNTER — Encounter: Admitting: Family Medicine

## 2024-07-23 ENCOUNTER — Inpatient Hospital Stay

## 2024-07-23 ENCOUNTER — Inpatient Hospital Stay: Attending: Oncology | Admitting: Oncology

## 2024-07-23 ENCOUNTER — Encounter: Payer: Self-pay | Admitting: Oncology

## 2024-07-23 ENCOUNTER — Other Ambulatory Visit: Payer: Self-pay | Admitting: Family Medicine

## 2024-07-23 VITALS — BP 165/87 | HR 62 | Resp 18

## 2024-07-23 DIAGNOSIS — Z8546 Personal history of malignant neoplasm of prostate: Secondary | ICD-10-CM | POA: Diagnosis not present

## 2024-07-23 DIAGNOSIS — K5730 Diverticulosis of large intestine without perforation or abscess without bleeding: Secondary | ICD-10-CM | POA: Diagnosis not present

## 2024-07-23 DIAGNOSIS — D500 Iron deficiency anemia secondary to blood loss (chronic): Secondary | ICD-10-CM

## 2024-07-23 DIAGNOSIS — Z7982 Long term (current) use of aspirin: Secondary | ICD-10-CM | POA: Insufficient documentation

## 2024-07-23 DIAGNOSIS — Z79890 Hormone replacement therapy (postmenopausal): Secondary | ICD-10-CM | POA: Diagnosis not present

## 2024-07-23 DIAGNOSIS — K625 Hemorrhage of anus and rectum: Secondary | ICD-10-CM | POA: Insufficient documentation

## 2024-07-23 DIAGNOSIS — N400 Enlarged prostate without lower urinary tract symptoms: Secondary | ICD-10-CM | POA: Insufficient documentation

## 2024-07-23 DIAGNOSIS — G479 Sleep disorder, unspecified: Secondary | ICD-10-CM | POA: Diagnosis not present

## 2024-07-23 DIAGNOSIS — Z7984 Long term (current) use of oral hypoglycemic drugs: Secondary | ICD-10-CM | POA: Insufficient documentation

## 2024-07-23 DIAGNOSIS — I493 Ventricular premature depolarization: Secondary | ICD-10-CM | POA: Diagnosis not present

## 2024-07-23 DIAGNOSIS — E039 Hypothyroidism, unspecified: Secondary | ICD-10-CM | POA: Insufficient documentation

## 2024-07-23 DIAGNOSIS — Z87442 Personal history of urinary calculi: Secondary | ICD-10-CM | POA: Insufficient documentation

## 2024-07-23 DIAGNOSIS — Z79899 Other long term (current) drug therapy: Secondary | ICD-10-CM | POA: Diagnosis not present

## 2024-07-23 DIAGNOSIS — E785 Hyperlipidemia, unspecified: Secondary | ICD-10-CM | POA: Insufficient documentation

## 2024-07-23 DIAGNOSIS — N1831 Chronic kidney disease, stage 3a: Secondary | ICD-10-CM

## 2024-07-23 DIAGNOSIS — I10 Essential (primary) hypertension: Secondary | ICD-10-CM | POA: Diagnosis not present

## 2024-07-23 DIAGNOSIS — K449 Diaphragmatic hernia without obstruction or gangrene: Secondary | ICD-10-CM | POA: Diagnosis not present

## 2024-07-23 DIAGNOSIS — Z923 Personal history of irradiation: Secondary | ICD-10-CM | POA: Diagnosis not present

## 2024-07-23 DIAGNOSIS — I509 Heart failure, unspecified: Secondary | ICD-10-CM | POA: Diagnosis not present

## 2024-07-23 DIAGNOSIS — I5032 Chronic diastolic (congestive) heart failure: Secondary | ICD-10-CM | POA: Diagnosis not present

## 2024-07-23 DIAGNOSIS — K635 Polyp of colon: Secondary | ICD-10-CM | POA: Diagnosis not present

## 2024-07-23 DIAGNOSIS — I130 Hypertensive heart and chronic kidney disease with heart failure and stage 1 through stage 4 chronic kidney disease, or unspecified chronic kidney disease: Secondary | ICD-10-CM | POA: Insufficient documentation

## 2024-07-23 DIAGNOSIS — D509 Iron deficiency anemia, unspecified: Secondary | ICD-10-CM | POA: Insufficient documentation

## 2024-07-23 DIAGNOSIS — N183 Chronic kidney disease, stage 3 unspecified: Secondary | ICD-10-CM | POA: Diagnosis not present

## 2024-07-23 DIAGNOSIS — K649 Unspecified hemorrhoids: Secondary | ICD-10-CM | POA: Diagnosis not present

## 2024-07-23 LAB — COMPREHENSIVE METABOLIC PANEL WITH GFR
ALT: 5 U/L (ref 0–44)
AST: 15 U/L (ref 15–41)
Albumin: 4.1 g/dL (ref 3.5–5.0)
Alkaline Phosphatase: 65 U/L (ref 38–126)
Anion gap: 13 (ref 5–15)
BUN: 14 mg/dL (ref 8–23)
CO2: 26 mmol/L (ref 22–32)
Calcium: 8.7 mg/dL — ABNORMAL LOW (ref 8.9–10.3)
Chloride: 107 mmol/L (ref 98–111)
Creatinine, Ser: 1.49 mg/dL — ABNORMAL HIGH (ref 0.61–1.24)
GFR, Estimated: 46 mL/min — ABNORMAL LOW (ref >60)
Glucose, Bld: 87 mg/dL (ref 70–99)
Potassium: 4 mmol/L (ref 3.5–5.1)
Sodium: 145 mmol/L (ref 135–145)
Total Bilirubin: 0.5 mg/dL (ref 0.0–1.2)
Total Protein: 7 g/dL (ref 6.5–8.1)

## 2024-07-23 LAB — CBC WITH DIFFERENTIAL/PLATELET
Abs Immature Granulocytes: 0.02 K/uL (ref 0.00–0.07)
Basophils Absolute: 0.1 K/uL (ref 0.0–0.1)
Basophils Relative: 1 %
Eosinophils Absolute: 0.5 K/uL (ref 0.0–0.5)
Eosinophils Relative: 10 %
HCT: 32.9 % — ABNORMAL LOW (ref 39.0–52.0)
Hemoglobin: 9.7 g/dL — ABNORMAL LOW (ref 13.0–17.0)
Immature Granulocytes: 0 %
Lymphocytes Relative: 17 %
Lymphs Abs: 0.8 K/uL (ref 0.7–4.0)
MCH: 24.6 pg — ABNORMAL LOW (ref 26.0–34.0)
MCHC: 29.5 g/dL — ABNORMAL LOW (ref 30.0–36.0)
MCV: 83.3 fL (ref 80.0–100.0)
Monocytes Absolute: 0.4 K/uL (ref 0.1–1.0)
Monocytes Relative: 8 %
Neutro Abs: 2.8 K/uL (ref 1.7–7.7)
Neutrophils Relative %: 64 %
Platelets: 254 K/uL (ref 150–400)
RBC: 3.95 MIL/uL — ABNORMAL LOW (ref 4.22–5.81)
RDW: 19.9 % — ABNORMAL HIGH (ref 11.5–15.5)
WBC: 4.5 K/uL (ref 4.0–10.5)
nRBC: 0 % (ref 0.0–0.2)

## 2024-07-23 LAB — IRON AND TIBC
Iron: 22 ug/dL — ABNORMAL LOW (ref 45–182)
Saturation Ratios: 6 % — ABNORMAL LOW (ref 17.9–39.5)
TIBC: 356 ug/dL (ref 250–450)
UIBC: 334 ug/dL

## 2024-07-23 LAB — VITAMIN B12: Vitamin B-12: 596 pg/mL (ref 180–914)

## 2024-07-23 LAB — FOLATE: Folate: >20 ng/mL (ref >5.9)

## 2024-07-23 LAB — FERRITIN: Ferritin: 54 ng/mL (ref 24–336)

## 2024-07-23 NOTE — Addendum Note (Signed)
 Addended by: Kaysa Roulhac R on: 07/23/2024 03:11 PM   Modules accepted: Orders

## 2024-07-23 NOTE — Progress Notes (Signed)
 [facility]  [phone] [phone]    INITIAL HEMATOLOGY CONSULTATION  Referring MD: Dr. Deatrice Dine   Reason for Referral: Iron deficiency anemia   HPI: [name] is an 83 year old male with a past medical history significant for congestive heart failure, cardiomyopathy, hypothyroidism, hyperlipidemia, hypertension.  The patient has been referred to hematology for evaluation of iron deficiency anemia.    His most recent lab work was completed on 06/11/2024 which showed a WBC of 4.5, hemoglobin 9.0, MCV 87, platelets 269,000, creatinine 1.52 with an eGFR of 45, normal liver function studies.  On 06/02/2024, stool for occult blood was positive.  His last ferritin level was drawn on 04/13/2024 and was low at 19.  Patient was hospitalized from 06/02/2024 through 06/05/2024 for rectal bleeding.  He received 1 unit PRBCs during his hospitalization for hemoglobin of 7.2.  The patient has been seen by GI and underwent EGD and colonoscopy on 06/04/2024.  EGD showed a 1 cm hiatal hernia, normal stomach, normal duodenum.  Colonoscopy showed hemorrhoids, one 4 mm polyp in the ascending colon, diverticulosis in the sigmoid colon, evidence of recent bleeding from the diverticular opening.  The patient reports that he feels somewhat better after receiving the blood transfusion.  However, he still has fatigue.  He is also having some difficulty sleeping but reports that he was given medication by his primary care provider which seems to be helping.  He is not having any fevers, chills, night sweats.  No lack of appetite or unintentional weight loss.  He denies chest pain or shortness of breath.  Denies abdominal pain, nausea, vomiting.  He has not seen any bleeding such as epistaxis, hematuria, melena, hematochezia.  The patient is unsure if he is taking an iron tablet, however, I can see if this was prescribed for him by his GI doctor.  He recalls getting the blood  transfusion during his hospitalization in August.  He does not think that he ever received IV iron.  Past Medical History:  Diagnosis Date   Allergic rhinitis, seasonal    BPH (benign prostatic hypertrophy)    Erectile dysfunction    Heart failure with improved ejection fraction (HFimpEF) (HCC)    a. EF 25-30% by echo in 04/2022 with cath showing normal cors b. EF at 30-35% in 10/2022 c. EF 40-45% in 06/2023 and cardiomyopathy felt to be due to PVC's   History of kidney stones    Hx of thyroidectomy    Hyperlipidemia    Hypertension    Hypothyroidism    Pre-diabetes    Prostate cancer (HCC) 2011   treated with radiation   PVC's (premature ventricular contractions)    Urosepsis 07/2011   ICU admission [facility]  :  Past Surgical History:  Procedure Laterality Date   COLONOSCOPY N/A 09/03/2013   Procedure: COLONOSCOPY;  Surgeon: Claudis RAYMOND Rivet, MD;  [facility];  Service: Endoscopy;  Laterality: N/A;  1225   COLONOSCOPY N/A 11/08/2015   Procedure: COLONOSCOPY;  Surgeon: Claudis RAYMOND Rivet, MD;  [facility];  Service: Endoscopy;  Laterality: N/A;  1200   COLONOSCOPY N/A 06/04/2024   Procedure: COLONOSCOPY;  Surgeon: Eartha Angelia Sieving, MD;  [facility];  Service: Gastroenterology;  Laterality: N/A;   ESOPHAGOGASTRODUODENOSCOPY N/A 06/04/2024   Procedure: EGD (ESOPHAGOGASTRODUODENOSCOPY);  Surgeon: Eartha Angelia, Sieving, MD;  [facility];  Service: Gastroenterology;  Laterality: N/A;   MASS EXCISION N/A 08/15/2017   Procedure: EXCISION SEBACEOUS CYST NECK;  Surgeon: Mavis Anes, MD;  Location: [facility];  Service: General;  Laterality: N/A;   PROSTATE SURGERY     RIGHT/LEFT HEART CATH AND CORONARY ANGIOGRAPHY N/A 04/30/2022   Procedure: RIGHT/LEFT HEART CATH AND CORONARY ANGIOGRAPHY;  Surgeon: Swazil[place], Peter M, MD;  Location: [facility] INVASIVE CV LAB;  Service: Cardiovascular;  Laterality: N/A;   THYROIDECTOMY  2003   transurethral resection of  thr prostate  2003   ureteral stone extraction  08/2011  :   CURRENT MEDS: Current Outpatient Medications  Medication Sig Dispense Refill   GAVILYTE-G 236 g solution Take 4,000 mLs by mouth once.     amiodarone  (PACERONE ) 200 MG tablet Take 200 mg once daily except do not take on Sundays. 180 tablet 3   aspirin  EC 81 MG tablet Take 1 tablet (81 mg total) by mouth daily. 30 tablet 5   cetirizine  (ZYRTEC ) 10 MG tablet TAKE 1 TABLET EVERY DAY 90 tablet 10   dapagliflozin  propanediol (FARXIGA ) 10 MG TABS tablet Take 1 tablet (10 mg total) by mouth daily before breakfast.     EPINEPHrine  0.3 mg/0.3 mL IJ SOAJ injection Inject 0.3 mLs (0.3 mg total) into the muscle as needed for anaphylaxis. 1 each 2   ferrous sulfate  325 (65 FE) MG tablet Take 1 tablet (325 mg total) by mouth every other day. 30 tablet 0   levothyroxine  (SYNTHROID ) 100 MCG tablet Take 100 mcg by mouth daily. (Patient not taking: Reported on 07/05/2024)     levothyroxine  (SYNTHROID ) 75 MCG tablet Take 1 tablet (75 mcg total) by mouth daily. 90 tablet 3   losartan  (COZAAR ) 50 MG tablet Take half tablet by mouth once daily for blood pressure 30 tablet 2   lovastatin  (MEVACOR ) 20 MG tablet Take 20 mg by mouth at bedtime.     metoprolol  succinate (TOPROL -XL) 50 MG 24 hr tablet Take 1 tablet (50 mg total) by mouth daily. 90 tablet 1   mirtazapine  (REMERON ) 7.5 MG tablet Take 0.5 tablets (3.75 mg total) by mouth at bedtime.     Multiple Vitamin (MULTIVITAMIN WITH MINERALS) TABS tablet Take 1 tablet by mouth daily.     spironolactone  (ALDACTONE ) 25 MG tablet Take 25 mg by mouth daily as needed (swelling).     tadalafil  (CIALIS ) 5 MG tablet TAKE ONE TABLET BY MOUTH ONCE DAILY. 30 tablet 2   tamsulosin  (FLOMAX ) 0.4 MG CAPS capsule Take 1 capsule (0.4 mg total) by mouth daily. 30 capsule 5   No current facility-administered medications for this visit.    Allergies  Allergen Reactions   Peanut-Containing Drug Products Shortness Of  Breath and Swelling   Egg Protein-Containing Drug Products Swelling   Iodine  Swelling   Penicillins Hives, Rash and Other (See Comments)    Severe rash involving mucus membranes or skin necrosis   Shellfish Allergy Swelling  :   Family History  Problem Relation Age of Onset   Stroke Mother    Diabetes Father    Hypertension Father    Kidney disease Brother    Stroke Brother    Diabetes Brother    Gout Brother    Diabetes Brother   :  Social History   Socioeconomic History   Marital status: Married    Spouse name: Not on file   Number of children: 4   Years of education: Not on file   Highest education level: Not on file  Occupational History   Occupation: retired  Tobacco Use   Smoking status: Never    Passive  exposure: Never   Smokeless tobacco: Never  Vaping Use   Vaping status: Never Used  Substance and Sexual Activity   Alcohol use: No    Alcohol/week: 0.0 standard drinks of alcohol   Drug use: No   Sexual activity: Yes  Other Topics Concern   Not on file  Social History Narrative   Not on file   Social Drivers of Health   Financial Resource Strain: Low Risk  (07/30/2023)   Overall Financial Resource Strain (CARDIA)    Difficulty of Paying Living Expenses: Not very hard  Food Insecurity: No Food Insecurity (06/16/2024)   Hunger Vital Sign    Worried About Running Out of Food in the Last Year: Never true    Ran Out of Food in the Last Year: Never true  Transportation Needs: No Transportation Needs (06/16/2024)   PRAPARE - Administrator, Civil Service (Medical): No    Lack of Transportation (Non-Medical): No  Physical Activity: Insufficiently Active (07/30/2023)   Exercise Vital Sign    Days of Exercise per Week: 3 days    Minutes of Exercise per Session: 20 min  Stress: No Stress Concern Present (07/30/2023)   [employer] of Occupational Health - Occupational Stress Questionnaire    Feeling of Stress : Not at all  Social Connections:  Moderately Integrated (06/16/2024)   Social Connection and Isolation Panel    Frequency of Communication with Friends and Family: More than three times a week    Frequency of Social Gatherings with Friends and Family: More than three times a week    Attends Religious Services: More than 4 times per year    Active Member of [employer] or Organizations: No    Attends Banker Meetings: Never    Marital Status: Married  Catering manager Violence: Not At Risk (06/16/2024)   Humiliation, Afraid, Rape, and Kick questionnaire    Fear of Current or Ex-Partner: No    Emotionally Abused: No    Physically Abused: No    Sexually Abused: No  :  REVIEW OF SYSTEMS:  A comprehensive 14 point review of systems was negative except as noted in the HPI.    Exam: BP (!) 165/87 (BP Location: Right Arm, Patient Position: Sitting)   Pulse 62   Resp 18   SpO2 97%    Physical Exam Vitals reviewed.  Constitutional:      General: He is not in acute distress. HENT:     Head: Normocephalic.  Eyes:     General: No scleral icterus.    Conjunctiva/sclera: Conjunctivae normal.  Cardiovascular:     Rate and Rhythm: Normal rate and regular rhythm.  Pulmonary:     Effort: Pulmonary effort is normal. No respiratory distress.     Breath sounds: Normal breath sounds.  Abdominal:     General: Bowel sounds are normal. There is no distension.     Palpations: Abdomen is soft.  Musculoskeletal:        General: Normal range of motion.     Right lower leg: No edema.     Left lower leg: No edema.  Lymphadenopathy:     Cervical: No cervical adenopathy.  Skin:    General: Skin is warm and dry.  Neurological:     Mental Status: He is alert and oriented to person, place, and time.  Psychiatric:        Mood and Affect: Mood normal.        Behavior: Behavior normal.  Thought Content: Thought content normal.        Judgment: Judgment normal.     LABS:  Lab Results  Component Value Date   WBC 4.5  06/11/2024   HGB 9.0 (L) 06/11/2024   HCT 30.0 (L) 06/11/2024   PLT 269 06/11/2024   GLUCOSE 98 06/11/2024   CHOL 159 04/13/2024   TRIG 51 04/13/2024   HDL 70 04/13/2024   LDLCALC 78 04/13/2024   ALT 11 06/02/2024   AST 18 06/02/2024   NA 144 06/11/2024   K 4.7 06/11/2024   CL 106 06/11/2024   CREATININE 1.52 (H) 06/11/2024   BUN 13 06/11/2024   CO2 22 06/11/2024   PSA 0.1 04/03/2020   INR 1.12 07/18/2011   HGBA1C 5.9 (H) 04/13/2024   MICROALBUR 56.96 (H) 03/21/2010    No results found.   ASSESSMENT AND PLAN:  1.  Iron deficiency anemia. Prior lab results have been reviewed and discussed with the patient.  The patient has iron deficiency anemia due to recent GI bleed.  He underwent EGD and colonoscopy during his hospitalization in August 2025 and was found to have a recent diverticular bleed.  He is not currently seeing any bleeding.  The patient was given oral iron and received 1 unit PRBCs.  However, he is not sure if he is taking the oral iron.  Today, I discussed proceeding with IV iron.  Adverse effects have been discussed with the patient and his wife including but not limited to risk of infusion reaction, nausea, arthralgias.  He agrees to proceed.  Will schedule IV iron once approved by his insurance.  I will obtain a repeat CBC, iron studies today.  Will also check for other nutritional deficiencies such as vitamin B12, folate, copper deficiency.  2.  History of rectal bleed. The patient is status post EGD and colonoscopy.  He denies any current bleeding.  Continue to monitor.  3.  Stage IIIa chronic kidney disease. Most recent creatinine was 1.52 with an eGFR 45.  I discussed with the patient and his wife that CKD can contribute to anemia.  We will need to monitor this in the future and consider treatment with ESA therapy if his renal function worsens.  Follow-up: The patient will return next week for IV iron.  Lab/office visit in about 8 weeks.  Thank you for this  referral.  Dantae Meunier, DNP, AGPCNP-BC, AOCNP

## 2024-07-26 LAB — COPPER, SERUM: Copper: 93 ug/dL (ref 69–132)

## 2024-07-30 ENCOUNTER — Inpatient Hospital Stay

## 2024-07-30 VITALS — BP 154/90 | HR 69 | Temp 97.4°F | Resp 18

## 2024-07-30 DIAGNOSIS — D500 Iron deficiency anemia secondary to blood loss (chronic): Secondary | ICD-10-CM

## 2024-07-30 DIAGNOSIS — D509 Iron deficiency anemia, unspecified: Secondary | ICD-10-CM | POA: Diagnosis not present

## 2024-07-30 MED ORDER — METHYLPREDNISOLONE SODIUM SUCC 125 MG IJ SOLR
125.0000 mg | Freq: Once | INTRAMUSCULAR | Status: AC
  Administered 2024-07-30: 125 mg via INTRAVENOUS
  Filled 2024-07-30: qty 2

## 2024-07-30 MED ORDER — ACETAMINOPHEN 325 MG PO TABS
650.0000 mg | ORAL_TABLET | Freq: Once | ORAL | Status: AC
  Administered 2024-07-30: 650 mg via ORAL
  Filled 2024-07-30: qty 2

## 2024-07-30 MED ORDER — SODIUM CHLORIDE 0.9 % IV SOLN
50.0000 mg | Freq: Once | INTRAVENOUS | Status: AC
  Administered 2024-07-30: 50 mg via INTRAVENOUS
  Filled 2024-07-30: qty 1

## 2024-07-30 MED ORDER — CETIRIZINE HCL 10 MG/ML IV SOLN
10.0000 mg | Freq: Once | INTRAVENOUS | Status: AC
  Administered 2024-07-30: 10 mg via INTRAVENOUS
  Filled 2024-07-30: qty 1

## 2024-07-30 MED ORDER — FAMOTIDINE IN NACL 20-0.9 MG/50ML-% IV SOLN
20.0000 mg | Freq: Once | INTRAVENOUS | Status: AC
  Administered 2024-07-30: 20 mg via INTRAVENOUS
  Filled 2024-07-30: qty 50

## 2024-07-30 MED ORDER — SODIUM CHLORIDE 0.9 % IV SOLN
950.0000 mg | Freq: Once | INTRAVENOUS | Status: AC
  Administered 2024-07-30: 950 mg via INTRAVENOUS
  Filled 2024-07-30: qty 19

## 2024-07-30 MED ORDER — SODIUM CHLORIDE 0.9 % IV SOLN: INTRAVENOUS | Status: DC

## 2024-07-30 NOTE — Patient Instructions (Signed)
 [facility] [place] - A DEPT OF [facility]  Discharge Instructions: Thank you for choosing [facility] to provide your oncology and hematology care.  If you have a lab appointment with the Cancer Center - please note that after April 8th, 2024, all labs will be drawn in the cancer center.  You do not have to check in or register with the main entrance as you have in the past but will complete your check-in in the cancer center.  Wear comfortable clothing and clothing appropriate for easy access to any Portacath or PICC line.   We strive to give you quality time with your provider. You may need to reschedule your appointment if you arrive late (15 or more minutes).  Arriving late affects you and other patients whose appointments are after yours.  Also, if you miss three or more appointments without notifying the office, you may be dismissed from the clinic at the provider's discretion.      For prescription refill requests, have your pharmacy contact our office and allow 72 hours for refills to be completed.    Today you received the following Infed, return as scheduled.   To help prevent nausea and vomiting after your treatment, we encourage you to take your nausea medication as directed.  BELOW ARE SYMPTOMS THAT SHOULD BE REPORTED IMMEDIATELY: *FEVER GREATER THAN 100.4 F (38 C) OR HIGHER *CHILLS OR SWEATING *NAUSEA AND VOMITING THAT IS NOT CONTROLLED WITH YOUR NAUSEA MEDICATION *UNUSUAL SHORTNESS OF BREATH *UNUSUAL BRUISING OR BLEEDING *URINARY PROBLEMS (pain or burning when urinating, or frequent urination) *BOWEL PROBLEMS (unusual diarrhea, constipation, pain near the anus) TENDERNESS IN MOUTH AND THROAT WITH OR WITHOUT PRESENCE OF ULCERS (sore throat, sores in mouth, or a toothache) UNUSUAL RASH, SWELLING OR PAIN  UNUSUAL VAGINAL DISCHARGE OR ITCHING   Items with * indicate a potential emergency and should be followed up as soon as possible or go  to the Emergency Department if any problems should occur.  Please show the CHEMOTHERAPY ALERT CARD or IMMUNOTHERAPY ALERT CARD at check-in to the Emergency Department and triage nurse.  Should you have questions after your visit or need to cancel or reschedule your appointment, please contact [facility] [place] - A DEPT OF [facility] [phone]  and follow the prompts.  Office hours are 8:00 a.m. to 4:30 p.m. Monday - Friday. Please note that voicemails left after 4:00 p.m. may not be returned until the following business day.  We are closed weekends and major holidays. You have access to a nurse at all times for urgent questions. Please call the main number to the clinic [phone] and follow the prompts.  For any non-urgent questions, you may also contact your provider using MyChart. We now offer e-Visits for anyone 16 and older to request care online for non-urgent symptoms. For details visit [email or web address].   Also download the MyChart app! Go to the app store, search "MyChart", open the app, select , and log in with your MyChart username and password.

## 2024-07-30 NOTE — Progress Notes (Signed)
 Patient tolerated iron infusion with no complaints voiced.  Peripheral IV site clean and dry with good blood return noted before and after infusion.  Band aid applied.  VSS with discharge and left in satisfactory condition with no s/s of distress noted.

## 2024-08-02 ENCOUNTER — Ambulatory Visit: Payer: PPO

## 2024-08-02 ENCOUNTER — Telehealth: Payer: Self-pay | Admitting: Family Medicine

## 2024-08-02 VITALS — Ht 67.0 in | Wt 142.0 lb

## 2024-08-02 DIAGNOSIS — Z0000 Encounter for general adult medical examination without abnormal findings: Secondary | ICD-10-CM | POA: Diagnosis not present

## 2024-08-02 NOTE — Patient Instructions (Signed)
 [name],  Thank you for taking the time for your Medicare Wellness Visit. I appreciate your continued commitment to your health goals. Please review the care plan we discussed, and feel free to reach out if I can assist you further.  Medicare recommends these wellness visits once per year to help you and your care team stay ahead of potential health issues. These visits are designed to focus on prevention, allowing your provider to concentrate on managing your acute and chronic conditions during your regular appointments.  Please note that Annual Wellness Visits do not include a physical exam. Some assessments may be limited, especially if the visit was conducted virtually. If needed, we may recommend a separate in-person follow-up with your provider.  Wishing you excellent health and many blessings in the year to come!  -Shakhia Gramajo, CMA  Ongoing Care Seeing your primary care provider every 3 to 6 months helps us  monitor your health and provide consistent, personalized care.   Recommended Screenings:  Health Maintenance  Topic Date Due   DTaP/Tdap/Td vaccine (2 - Tdap) 03/19/2014   COVID-19 Vaccine (6 - 2025-26 season) 06/14/2024   Yearly kidney health urinalysis for diabetes  10/12/2027*   Hemoglobin A1C  10/14/2024   Yearly kidney function blood test for diabetes  07/23/2025   Medicare Annual Wellness Visit  08/02/2025   Colon Cancer Screening  06/05/2027   Pneumococcal Vaccine for age over 37  Completed   Zoster (Shingles) Vaccine  Completed   Meningitis B Vaccine  Aged Out   Flu Shot  Discontinued  *Topic was postponed. The date shown is not the original due date.       07/30/2024    9:33 AM  Advanced Directives  Does Patient Have a Medical Advance Directive? No  Would patient like information on creating a medical advance directive? No - Patient declined   Advance Care Planning is important because it: Ensures you receive medical care that aligns with your values, goals,  and preferences. Provides guidance to your family and loved ones, reducing the emotional burden of decision-making during critical moments.  Vision: Annual vision screenings are recommended for early detection of glaucoma, cataracts, and diabetic retinopathy. These exams can also reveal signs of chronic conditions such as diabetes and high blood pressure.  Dental: Annual dental screenings help detect early signs of oral cancer, gum disease, and other conditions linked to overall health, including heart disease and diabetes.  Please see the attached documents for additional preventive care recommendations.

## 2024-08-02 NOTE — Telephone Encounter (Signed)
 Copied from CRM [id number]. Topic: Appointments - Scheduling Inquiry for Clinic >> Aug 02, 2024  2:17 PM Berwyn MATSU wrote: Reason for CRM: patient called in to reschedule missed AWV.   May you please assist.

## 2024-08-02 NOTE — Progress Notes (Signed)
 Please attest and cosign this visit due to patients primary care provider not being immediately available at the time the visit was completed.    Provider covering for patient's pcp is aware of needed labs. Provider consulted with me during patient's AWV regarding medication concerns. Per Dr. Tobie, an appointment was scheduled with him to do a complete medication reconciliation and assist patient in setting up pill packaging and delivery through [facility] to ensure patient is taking medications are prescribed. Patient is aware of appointment date and time and he understands he needs to take all of his medications with him for this appointment   Patient is in agreement with treatment plan.  Subjective:   [name] is a 83 y.o. who presents for a Medicare Wellness preventive visit. As a reminder, Annual Wellness Visits don't include a physical exam, and some assessments may be limited, especially if this visit is performed virtually. We may recommend an in-person follow-up visit with your provider if needed.  Visit Complete: Virtual I connected with  Ricardo Mickey Scarce on 08/02/24 by a audio enabled telemedicine application and verified that I am speaking with the correct person using two identifiers.  Patient Location: Home  Provider Location: Home Office  I discussed the limitations of evaluation and management by telemedicine. The patient expressed understanding and agreed to proceed.  Vital Signs: Because this visit was a virtual/telehealth visit, some criteria may be missing or patient reported. Any vitals not documented were not able to be obtained and vitals that have been documented are patient reported. VideoDeclined- This patient declined Librarian, academic. Therefore the visit was completed with audio only.  Persons Participating in Visit: Patient.  AWV Questionnaire: No: Patient Medicare AWV questionnaire was not  completed prior to this visit. Cardiac Risk Factors include: advanced age (>32men, >32 women);dyslipidemia;hypertension;male gender;sedentary lifestyle;Other (see comment), Risk factor comments: CHF     Objective:    Today's Vitals   08/02/24 1554  Weight: 142 lb (64.4 kg)  Height: 5' 7 (1.702 m)  PainSc: 0-No pain   Body mass index is 22.24 kg/m.    07/30/2024    9:33 AM 07/23/2024    1:00 PM 06/03/2024   12:33 PM 06/02/2024    1:53 PM 07/30/2023    1:40 PM 11/11/2022    2:20 PM 06/24/2022    1:14 PM  Advanced Directives  Does Patient Have a Medical Advance Directive? No No  No No No No  Would patient like information on creating a medical advance directive? No - Patient declined No - Patient declined No - Patient declined  No - Patient declined Yes (MAU/Ambulatory/Procedural Areas - Information given) Yes (ED - Information included in AVS)    Current Medications (verified) Outpatient Encounter Medications as of 08/02/2024  Medication Sig   amiodarone  (PACERONE ) 200 MG tablet Take 200 mg once daily except do not take on Sundays.   aspirin  EC 81 MG tablet Take 1 tablet (81 mg total) by mouth daily.   cetirizine  (ZYRTEC ) 10 MG tablet TAKE 1 TABLET EVERY DAY   dapagliflozin  propanediol (FARXIGA ) 10 MG TABS tablet Take 1 tablet (10 mg total) by mouth daily before breakfast.   EPINEPHrine  0.3 mg/0.3 mL IJ SOAJ injection Inject 0.3 mLs (0.3 mg total) into the muscle as needed for anaphylaxis.   levothyroxine  (SYNTHROID ) 75 MCG tablet Take 1 tablet (75 mcg total) by mouth daily.   losartan  (COZAAR ) 50 MG tablet Take half tablet by mouth once daily  for blood pressure   lovastatin  (MEVACOR ) 20 MG tablet Take 20 mg by mouth at bedtime.   metoprolol  succinate (TOPROL -XL) 50 MG 24 hr tablet Take 1 tablet (50 mg total) by mouth daily.   mirtazapine  (REMERON ) 7.5 MG tablet Take 0.5 tablets (3.75 mg total) by mouth at bedtime.   Multiple Vitamin (MULTIVITAMIN WITH MINERALS) TABS tablet Take 1  tablet by mouth daily.   spironolactone  (ALDACTONE ) 25 MG tablet Take 25 mg by mouth daily as needed (swelling).   tadalafil  (CIALIS ) 5 MG tablet TAKE ONE TABLET BY MOUTH ONCE DAILY.   tamsulosin  (FLOMAX ) 0.4 MG CAPS capsule Take 1 capsule (0.4 mg total) by mouth daily.   ferrous sulfate  325 (65 FE) MG tablet Take 1 tablet (325 mg total) by mouth every other day. (Patient not taking: Reported on 08/02/2024)   levothyroxine  (SYNTHROID ) 100 MCG tablet Take 100 mcg by mouth daily. (Patient not taking: Reported on 08/02/2024)   [DISCONTINUED] GAVILYTE-G 236 g solution Take 4,000 mLs by mouth once. (Patient not taking: Reported on 08/02/2024)   No facility-administered encounter medications on file as of 08/02/2024.    Allergies (verified) Peanut-containing drug products, Egg protein-containing drug products, Iodine , Penicillins, and Shellfish allergy   History: Past Medical History:  Diagnosis Date   Allergic rhinitis, seasonal    BPH (benign prostatic hypertrophy)    Erectile dysfunction    Heart failure with improved ejection fraction (HFimpEF) (HCC)    a. EF 25-30% by echo in 04/2022 with cath showing normal cors b. EF at 30-35% in 10/2022 c. EF 40-45% in 06/2023 and cardiomyopathy felt to be due to PVC's   History of kidney stones    Hx of thyroidectomy    Hyperlipidemia    Hypertension    Hypothyroidism    Pre-diabetes    Prostate cancer (HCC) 2011   treated with radiation   PVC's (premature ventricular contractions)    Urosepsis 07/2011   ICU admission [facility]   Past Surgical History:  Procedure Laterality Date   COLONOSCOPY N/A 09/03/2013   Procedure: COLONOSCOPY;  Surgeon: Claudis RAYMOND Rivet, MD;  [facility];  Service: Endoscopy;  Laterality: N/A;  1225   COLONOSCOPY N/A 11/08/2015   Procedure: COLONOSCOPY;  Surgeon: Claudis RAYMOND Rivet, MD;  Location: [facility];  Service: Endoscopy;  Laterality: N/A;  1200   COLONOSCOPY N/A 06/04/2024   Procedure: COLONOSCOPY;   Surgeon: Eartha Angelia Sieving, MD;  Location: [facility];  Service: Gastroenterology;  Laterality: N/A;   ESOPHAGOGASTRODUODENOSCOPY N/A 06/04/2024   Procedure: EGD (ESOPHAGOGASTRODUODENOSCOPY);  Surgeon: Eartha Angelia, Sieving, MD;  Location: [facility];  Service: Gastroenterology;  Laterality: N/A;   MASS EXCISION N/A 08/15/2017   Procedure: EXCISION SEBACEOUS CYST NECK;  Surgeon: Mavis Anes, MD;  Location: [facility] ORS;  Service: General;  Laterality: N/A;   PROSTATE SURGERY     RIGHT/LEFT HEART CATH AND CORONARY ANGIOGRAPHY N/A 04/30/2022   Procedure: RIGHT/LEFT HEART CATH AND CORONARY ANGIOGRAPHY;  Surgeon: Swazil[place], Peter M, MD;  Location: [facility] INVASIVE CV LAB;  Service: Cardiovascular;  Laterality: N/A;   THYROIDECTOMY  2003   transurethral resection of thr prostate  2003   ureteral stone extraction  08/2011   Family History  Problem Relation Age of Onset   Stroke Mother    Diabetes Father    Hypertension Father    Kidney disease Brother    Stroke Brother    Diabetes Brother    Gout Brother    Diabetes Brother    Social  History   Socioeconomic History   Marital status: Married    Spouse name: Not on file   Number of children: 4   Years of education: Not on file   Highest education level: Not on file  Occupational History   Occupation: retired  Tobacco Use   Smoking status: Never    Passive exposure: Never   Smokeless tobacco: Never  Vaping Use   Vaping status: Never Used  Substance and Sexual Activity   Alcohol use: No    Alcohol/week: 0.0 standard drinks of alcohol   Drug use: No   Sexual activity: Yes  Other Topics Concern   Not on file  Social History Narrative   Not on file   Social Drivers of Health   Financial Resource Strain: Low Risk  (08/02/2024)   Overall Financial Resource Strain (CARDIA)    Difficulty of Paying Living Expenses: Not very hard  Food Insecurity: No Food Insecurity (08/02/2024)   Hunger Vital Sign    Worried About Running Out  of Food in the Last Year: Never true    Ran Out of Food in the Last Year: Never true  Transportation Needs: No Transportation Needs (08/02/2024)   PRAPARE - Administrator, Civil Service (Medical): No    Lack of Transportation (Non-Medical): No  Physical Activity: Inactive (08/02/2024)   Exercise Vital Sign    Days of Exercise per Week: 0 days    Minutes of Exercise per Session: 0 min  Stress: No Stress Concern Present (08/02/2024)   [employer] of Occupational Health - Occupational Stress Questionnaire    Feeling of Stress: Not at all  Social Connections: Moderately Integrated (08/02/2024)   Social Connection and Isolation Panel    Frequency of Communication with Friends and Family: More than three times a week    Frequency of Social Gatherings with Friends and Family: More than three times a week    Attends Religious Services: More than 4 times per year    Active Member of [employer] or Organizations: No    Attends Banker Meetings: Never    Marital Status: Married    Tobacco Counseling Counseling given: Yes   Clinical Intake:   Pain Score: 0-No pain     Lab Results  Component Value Date   HGBA1C 5.9 (H) 04/13/2024   HGBA1C 6.1 (H) 04/22/2023   HGBA1C 6.2 (H) 10/10/2022           Activities of Daily Living     08/02/2024    4:02 PM 06/03/2024   12:35 PM  In your present state of health, do you have any difficulty performing the following activities:  Hearing? 0 0  Vision? 0 0  Difficulty concentrating or making decisions? 0 0  Walking or climbing stairs? 0   Dressing or bathing? 0   Doing errands, shopping? 0   Preparing Food and eating ? N   Using the Toilet? N   In the past six months, have you accidently leaked urine? N   Do you have problems with loss of bowel control? N   Managing your Medications? N   Managing your Finances? N   Housekeeping or managing your Housekeeping? N    Patient Care Team: Antonetta Rollene BRAVO, MD as  PCP - General (Family Medicine) Waddell Danelle ORN, MD as PCP - Electrophysiology (Cardiology) Mallipeddi, Diannah SQUIBB, MD as PCP - Cardiology (Cardiology) Sharleen Carry, MD as Attending Physician (Urology) Ahmed, Deatrice FALCON, MD as Consulting Physician (Gastroenterology)  I  have updated your Care Teams any recent Medical Services you may have received from other providers in the past year.     Assessment:   This is a routine wellness examination for [name].  Hearing/Vision screen Hearing Screening - Comments:: Patient denies any hearing difficulties.   Vision Screening - Comments:: Wears rx glasses - up to date with routine eye exams with  Dr. Serena Morita   Goals Addressed               This Visit's Progress     I want to get back in shape so I can work like I used to be able to. (pt-stated)          Depression Screen     08/02/2024    4:03 PM 07/23/2024   12:57 PM 06/16/2024    3:25 PM 06/11/2024    8:20 AM 06/08/2024    4:37 PM 04/13/2024    1:01 PM 01/13/2024   11:09 AM  PHQ 2/9 Scores  PHQ - 2 Score 0 0 0 0 0 0 0  PHQ- 9 Score 0  1 1        Fall Risk     08/02/2024    4:00 PM 06/16/2024    3:24 PM 06/11/2024    8:20 AM 06/08/2024    4:15 PM 04/13/2024    1:01 PM  Fall Risk   Falls in the past year? 1 1 1  -- 0  Comment    Bloody stools, tear in intestine patient stated. Next morning still having bloody stools, took bath & when dressed, stood up and was dizzy. Fell across the edge of bed, not floor.   Number falls in past yr: 0  0  0  Injury with Fall? 1  0  0  Risk for fall due to : History of fall(s);Impaired balance/gait;Impaired mobility Medication side effect;Orthopedic patient  Medication side effect;Other (Comment)   Risk for fall due to: Comment    Dizziness from rectal bleeding and fell across the bed, did not hit the floor.   Follow up Falls evaluation completed;Education provided;Falls prevention discussed Falls prevention discussed Falls evaluation  completed Falls prevention discussed Falls evaluation completed    MEDICARE RISK AT HOME:  Medicare Risk at Home Any stairs in or around the home?: Yes If so, are there any without handrails?: No Home free of loose throw rugs in walkways, pet beds, electrical cords, etc?: Yes Adequate lighting in your home to reduce risk of falls?: Yes Life alert?: No Use of a cane, walker or w/c?: No Grab bars in the bathroom?: No Shower chair or bench in shower?: No Elevated toilet seat or a handicapped toilet?: No  TIMED UP AND GO: Was the test performed?  No  Cognitive Function: 6CIT completed        08/02/2024    4:01 PM 07/30/2023    1:43 PM 06/24/2022    1:32 PM 06/13/2021    3:44 PM 06/12/2020    3:33 PM  6CIT Screen  What Year? 0 points 0 points 0 points 0 points 0 points  What month? 0 points 0 points 0 points 0 points 0 points  What time? 0 points 0 points 0 points 0 points 0 points  Count back from 20 0 points 0 points 0 points 0 points 0 points  Months in reverse 0 points 0 points 0 points 2 points 0 points  Repeat phrase 4 points 0 points 0 points 0 points 4 points  Total  Score 4 points 0 points 0 points 2 points 4 points    Immunizations Immunization History  Administered Date(s) Administered   Fluad Trivalent(High Dose 65+) 09/24/2023   Influenza Whole 09/28/2004   MODERNA COVID-19 SARS-COV-2 PEDS BIVALENT BOOSTER 64yr-78yr 10/02/2021   Moderna SARS-COV2 Booster Vaccination 08/02/2022   Moderna Sars-Covid-2 Vaccination 12/01/2019, 12/29/2019, 08/29/2020, 04/10/2021, 10/02/2021   Pneumococcal Conjugate-13 09/05/2014   Pneumococcal Polysaccharide-23 03/19/2004, 07/11/2009   Td 03/19/2004   Zoster Recombinant(Shingrix) 10/02/2021, 01/24/2022    Screening Tests Health Maintenance  Topic Date Due   DTaP/Tdap/Td (2 - Tdap) 03/19/2014   COVID-19 Vaccine (6 - 2025-26 season) 06/14/2024   Diabetic kidney evaluation - Urine ACR  10/12/2027 (Originally 03/22/2011)   HEMOGLOBIN  A1C  10/14/2024   Diabetic kidney evaluation - eGFR measurement  07/23/2025   Medicare Annual Wellness (AWV)  08/02/2025   Colonoscopy  06/05/2027   Pneumococcal Vaccine: 50+ Years  Completed   Zoster Vaccines- Shingrix  Completed   Meningococcal B Vaccine  Aged Out   Influenza Vaccine  Discontinued    Health Maintenance Health Maintenance Due  Topic Date Due   DTaP/Tdap/Td (2 - Tdap) 03/19/2014   COVID-19 Vaccine (6 - 2025-26 season) 06/14/2024   Health Maintenance Items Addressed: Provider covering for patient's pcp is aware of needed labs. Provider consulted with me during patient's AWV regarding medication concerns. Per Dr. Tobie, an appointment was scheduled with him to do a complete medication reconciliation and assist patient in setting up pill packaging and delivery through [facility] to ensure patient is taking medications are prescribed. Patient is aware of appointment date and time and he understands he needs to take all of his medications with him for this appointment   Patient is in agreement with treatment plan.   Additional Screening: Vision Screening: Recommended annual ophthalmology exams for early detection of glaucoma and other disorders of the eye. Would you like a referral to an eye doctor? No    Dental Screening: Recommended annual dental exams for proper oral hygiene  Community Resource Referral / Chronic Care Management: CRR required this visit?  No   CCM required this visit?  Appt scheduled with PCP  Plan:   I have personally reviewed and noted the following in the patient's chart:   Medical and social history Use of alcohol, tobacco or illicit drugs  Current medications and supplements including opioid prescriptions. Patient is not currently taking opioid prescriptions. Functional ability and status Nutritional status Physical activity Advanced directives List of other physicians Hospitalizations, surgeries, and ER visits in  previous 12 months Vitals Screenings to include cognitive, depression, and falls Referrals and appointments  In addition, I have reviewed and discussed with patient certain preventive protocols, quality metrics, and best practice recommendations. A written personalized care plan for preventive services as well as general preventive health recommendations were provided to patient.   Zlata Alcaide, CMA   08/02/2024   After Visit Summary: (Mail) Due to this being a telephonic visit, the after visit summary with patients personalized plan was offered to patient via mail   Notes: PCP Follow Up Recommendations: appointment scheduled for complete medication reconciliation on 08/05/2024

## 2024-08-05 ENCOUNTER — Ambulatory Visit (INDEPENDENT_AMBULATORY_CARE_PROVIDER_SITE_OTHER): Admitting: Internal Medicine

## 2024-08-05 ENCOUNTER — Encounter: Payer: Self-pay | Admitting: Internal Medicine

## 2024-08-05 VITALS — BP 138/68 | HR 67 | Ht 67.0 in | Wt 146.6 lb

## 2024-08-05 DIAGNOSIS — R7303 Prediabetes: Secondary | ICD-10-CM | POA: Diagnosis not present

## 2024-08-05 DIAGNOSIS — Z7189 Other specified counseling: Secondary | ICD-10-CM

## 2024-08-05 DIAGNOSIS — I110 Hypertensive heart disease with heart failure: Secondary | ICD-10-CM | POA: Diagnosis not present

## 2024-08-05 DIAGNOSIS — E782 Mixed hyperlipidemia: Secondary | ICD-10-CM

## 2024-08-05 DIAGNOSIS — I5022 Chronic systolic (congestive) heart failure: Secondary | ICD-10-CM | POA: Diagnosis not present

## 2024-08-05 DIAGNOSIS — E039 Hypothyroidism, unspecified: Secondary | ICD-10-CM

## 2024-08-05 DIAGNOSIS — I10 Essential (primary) hypertension: Secondary | ICD-10-CM

## 2024-08-05 MED ORDER — DAPAGLIFLOZIN PROPANEDIOL 5 MG PO TABS: 5.0000 mg | ORAL_TABLET | Freq: Every day | ORAL | Status: DC

## 2024-08-05 NOTE — Patient Instructions (Signed)
Please continue to take medications as prescribed. ? ?Please continue to follow low salt diet and ambulate as tolerated. ?

## 2024-08-05 NOTE — Assessment & Plan Note (Signed)
 Appears euvolemic currently On losartan  50 mg QD, metoprolol  50 mg QD and spironolactone  12.5 mg QD Supposed to be on Farxiga  10 mg QD, provided sample of 5 mg today as we did not have 10 mg samples in the office today -advised to contact cardiology office for further assistance with sample Referred to clinical pharmacist for medication adherence counseling and patient assistance program for Farxiga 

## 2024-08-05 NOTE — Progress Notes (Signed)
 Established Patient Office Visit  Subjective:  Patient ID: [name], male    DOB: June 13, 1941  Age: 83 y.o. MRN: [id number]  CC:  Chief Complaint  Patient presents with   Medical Management of Chronic Issues    Needs to have medications reviewed would like refills to be sent to [facility] long pharmacy.     HPI [name] is a 83 y.o. male with past medical history of HTN, CHF, hypothyroidism, HLD, prostate cancer and insomnia who presents for f/u of his chronic medical conditions and clarification of his medications.  He is a patient of Dr. Antonetta.  His BP is WNL.  He takes losartan  50 mg QD, spironolactone  12.5 mg QD, metoprolol  50 mg QD currently.  He is followed by cardiology for CHF.  He is supposed to take Farxiga  10 mg QD, but has cost concern.  Denies for dyspnea or fatigue currently.  He currently takes levothyroxine  75 mcg QD.  Chart review also had previous dose of 100 mcg, which was discontinued from the chart today.  He denies any recent change in weight or appetite.  He takes mirtazapine  3.75 mg nightly for insomnia.  He also takes tamsulosin  0.4 mg QD for nocturia and urinary hesitancy.  Extensive medication review was done with the patient today.  Past Medical History:  Diagnosis Date   Allergic rhinitis, seasonal    BPH (benign prostatic hypertrophy)    Erectile dysfunction    Heart failure with improved ejection fraction (HFimpEF) (HCC)    a. EF 25-30% by echo in 04/2022 with cath showing normal cors b. EF at 30-35% in 10/2022 c. EF 40-45% in 06/2023 and cardiomyopathy felt to be due to PVC's   History of kidney stones    Hx of thyroidectomy    Hyperlipidemia    Hypertension    Hypothyroidism    Pre-diabetes    Prostate cancer (HCC) 2011   treated with radiation   PVC's (premature ventricular contractions)    Urosepsis 07/2011   ICU admission [facility]    Past Surgical History:  Procedure Laterality Date   COLONOSCOPY N/A 09/03/2013    Procedure: COLONOSCOPY;  Surgeon: Claudis RAYMOND Rivet, MD;  Location: [facility];  Service: Endoscopy;  Laterality: N/A;  1225   COLONOSCOPY N/A 11/08/2015   Procedure: COLONOSCOPY;  Surgeon: Claudis RAYMOND Rivet, MD;  Location: [facility];  Service: Endoscopy;  Laterality: N/A;  1200   COLONOSCOPY N/A 06/04/2024   Procedure: COLONOSCOPY;  Surgeon: Eartha Angelia Sieving, MD;  Location: [facility];  Service: Gastroenterology;  Laterality: N/A;   ESOPHAGOGASTRODUODENOSCOPY N/A 06/04/2024   Procedure: EGD (ESOPHAGOGASTRODUODENOSCOPY);  Surgeon: Eartha Angelia, Sieving, MD;  Location: [facility];  Service: Gastroenterology;  Laterality: N/A;   MASS EXCISION N/A 08/15/2017   Procedure: EXCISION SEBACEOUS CYST NECK;  Surgeon: Mavis Anes, MD;  Location: [facility] ORS;  Service: General;  Laterality: N/A;   PROSTATE SURGERY     RIGHT/LEFT HEART CATH AND CORONARY ANGIOGRAPHY N/A 04/30/2022   Procedure: RIGHT/LEFT HEART CATH AND CORONARY ANGIOGRAPHY;  Surgeon: Swaziland, Peter M, MD;  Location: [facility] INVASIVE CV LAB;  Service: Cardiovascular;  Laterality: N/A;   THYROIDECTOMY  2003   transurethral resection of thr prostate  2003   ureteral stone extraction  08/2011    Family History  Problem Relation Age of Onset   Stroke Mother    Diabetes Father    Hypertension Father    Kidney disease Brother    Stroke Brother  Diabetes Brother    Gout Brother    Diabetes Brother     Social History   Socioeconomic History   Marital status: Married    Spouse name: Not on file   Number of children: 4   Years of education: Not on file   Highest education level: Not on file  Occupational History   Occupation: retired  Tobacco Use   Smoking status: Never    Passive exposure: Never   Smokeless tobacco: Never  Vaping Use   Vaping status: Never Used  Substance and Sexual Activity   Alcohol use: No    Alcohol/week: 0.0 standard drinks of alcohol   Drug use: No   Sexual activity: Yes  Other Topics  Concern   Not on file  Social History Narrative   Not on file   Social Drivers of Health   Financial Resource Strain: Low Risk  (08/02/2024)   Overall Financial Resource Strain (CARDIA)    Difficulty of Paying Living Expenses: Not very hard  Food Insecurity: No Food Insecurity (08/02/2024)   Hunger Vital Sign    Worried About Running Out of Food in the Last Year: Never true    Ran Out of Food in the Last Year: Never true  Transportation Needs: No Transportation Needs (08/02/2024)   PRAPARE - Administrator, Civil Service (Medical): No    Lack of Transportation (Non-Medical): No  Physical Activity: Inactive (08/02/2024)   Exercise Vital Sign    Days of Exercise per Week: 0 days    Minutes of Exercise per Session: 0 min  Stress: No Stress Concern Present (08/02/2024)   [employer] of Occupational Health - Occupational Stress Questionnaire    Feeling of Stress: Not at all  Social Connections: Moderately Integrated (08/02/2024)   Social Connection and Isolation Panel    Frequency of Communication with Friends and Family: More than three times a week    Frequency of Social Gatherings with Friends and Family: More than three times a week    Attends Religious Services: More than 4 times per year    Active Member of [employer] or Organizations: No    Attends Banker Meetings: Never    Marital Status: Married  Catering manager Violence: Not At Risk (08/02/2024)   Humiliation, Afraid, Rape, and Kick questionnaire    Fear of Current or Ex-Partner: No    Emotionally Abused: No    Physically Abused: No    Sexually Abused: No    Outpatient Medications Prior to Visit  Medication Sig Dispense Refill   amiodarone  (PACERONE ) 200 MG tablet Take 200 mg once daily except do not take on Sundays. 180 tablet 3   aspirin  EC 81 MG tablet Take 1 tablet (81 mg total) by mouth daily. 30 tablet 5   cetirizine  (ZYRTEC ) 10 MG tablet TAKE 1 TABLET EVERY DAY 90 tablet 10    dapagliflozin  propanediol (FARXIGA ) 10 MG TABS tablet Take 1 tablet (10 mg total) by mouth daily before breakfast.     EPINEPHrine  0.3 mg/0.3 mL IJ SOAJ injection Inject 0.3 mLs (0.3 mg total) into the muscle as needed for anaphylaxis. 1 each 2   ferrous sulfate  325 (65 FE) MG tablet Take 1 tablet (325 mg total) by mouth every other day. 30 tablet 0   levothyroxine  (SYNTHROID ) 75 MCG tablet Take 1 tablet (75 mcg total) by mouth daily. 90 tablet 3   losartan  (COZAAR ) 50 MG tablet Take half tablet by mouth once daily for blood pressure 30  tablet 2   lovastatin  (MEVACOR ) 20 MG tablet Take 20 mg by mouth at bedtime.     metoprolol  succinate (TOPROL -XL) 50 MG 24 hr tablet Take 1 tablet (50 mg total) by mouth daily. 90 tablet 1   mirtazapine  (REMERON ) 7.5 MG tablet Take 0.5 tablets (3.75 mg total) by mouth at bedtime.     Multiple Vitamin (MULTIVITAMIN WITH MINERALS) TABS tablet Take 1 tablet by mouth daily.     spironolactone  (ALDACTONE ) 25 MG tablet Take 12.5 mg by mouth daily.     tadalafil  (CIALIS ) 5 MG tablet TAKE ONE TABLET BY MOUTH ONCE DAILY. 30 tablet 2   tamsulosin  (FLOMAX ) 0.4 MG CAPS capsule Take 1 capsule (0.4 mg total) by mouth daily. 30 capsule 5   levothyroxine  (SYNTHROID ) 100 MCG tablet Take 100 mcg by mouth daily. (Patient not taking: Reported on 08/05/2024)     No facility-administered medications prior to visit.    Allergies  Allergen Reactions   Peanut-Containing Drug Products Shortness Of Breath and Swelling   Egg Protein-Containing Drug Products Swelling   Iodine  Swelling   Penicillins Hives, Rash and Other (See Comments)    Severe rash involving mucus membranes or skin necrosis   Shellfish Allergy Swelling    ROS Review of Systems  Constitutional:  Negative for chills and fever.  HENT:  Negative for congestion and sore throat.   Eyes:  Negative for pain and discharge.  Respiratory:  Negative for cough and shortness of breath.   Cardiovascular:  Negative for chest  pain and palpitations.  Gastrointestinal:  Negative for diarrhea, nausea and vomiting.  Endocrine: Negative for polydipsia and polyuria.  Genitourinary:  Negative for dysuria and hematuria.  Musculoskeletal:  Negative for neck pain and neck stiffness.  Skin:  Negative for rash.  Neurological:  Negative for dizziness and weakness.  Psychiatric/Behavioral:  Negative for agitation and behavioral problems.       Objective:    Physical Exam Vitals reviewed.  Constitutional:      General: He is not in acute distress.    Appearance: He is not diaphoretic.  HENT:     Head: Normocephalic and atraumatic.     Nose: Nose normal.     Mouth/Throat:     Mouth: Mucous membranes are moist.  Eyes:     General: No scleral icterus.    Extraocular Movements: Extraocular movements intact.  Cardiovascular:     Rate and Rhythm: Normal rate and regular rhythm.     Heart sounds: Normal heart sounds. No murmur heard. Pulmonary:     Breath sounds: Normal breath sounds. No wheezing or rales.  Musculoskeletal:     Cervical back: Neck supple. No tenderness.     Right lower leg: No edema.     Left lower leg: No edema.  Skin:    General: Skin is warm.     Findings: No rash.  Neurological:     General: No focal deficit present.     Mental Status: He is alert and oriented to person, place, and time.  Psychiatric:        Mood and Affect: Mood normal.        Behavior: Behavior normal.     BP 138/68 (BP Location: Left Arm)   Pulse 67   Ht 5' 7 (1.702 m)   Wt 146 lb 9.6 oz (66.5 kg)   SpO2 97%   BMI 22.96 kg/m  Wt Readings from Last 3 Encounters:  08/05/24 146 lb 9.6 oz (66.5 kg)  08/02/24 142  lb (64.4 kg)  07/05/24 145 lb 8 oz (66 kg)    Lab Results  Component Value Date   TSH 3.100 01/07/2024   Lab Results  Component Value Date   WBC 4.5 07/23/2024   HGB 9.7 (L) 07/23/2024   HCT 32.9 (L) 07/23/2024   MCV 83.3 07/23/2024   PLT 254 07/23/2024   Lab Results  Component Value Date    NA 145 07/23/2024   K 4.0 07/23/2024   CO2 26 07/23/2024   GLUCOSE 87 07/23/2024   BUN 14 07/23/2024   CREATININE 1.49 (H) 07/23/2024   BILITOT 0.5 07/23/2024   ALKPHOS 65 07/23/2024   AST 15 07/23/2024   ALT 5 07/23/2024   PROT 7.0 07/23/2024   ALBUMIN 4.1 07/23/2024   CALCIUM 8.7 (L) 07/23/2024   ANIONGAP 13 07/23/2024   EGFR 45 (L) 06/11/2024   Lab Results  Component Value Date   CHOL 159 04/13/2024   Lab Results  Component Value Date   HDL 70 04/13/2024   Lab Results  Component Value Date   LDLCALC 78 04/13/2024   Lab Results  Component Value Date   TRIG 51 04/13/2024   Lab Results  Component Value Date   CHOLHDL 1.9 06/11/2023   Lab Results  Component Value Date   HGBA1C 5.9 (H) 04/13/2024      Assessment & Plan:   Problem List Items Addressed This Visit       Cardiovascular and Mediastinum   Chronic systolic CHF (congestive heart failure) (HCC) - Primary (Chronic)   Appears euvolemic currently On losartan  50 mg QD, metoprolol  50 mg QD and spironolactone  12.5 mg QD Supposed to be on Farxiga  10 mg QD, provided sample of 5 mg today as we did not have 10 mg samples in the office today -advised to contact cardiology office for further assistance with sample Referred to clinical pharmacist for medication adherence counseling and patient assistance program for Farxiga       Relevant Orders   AMB Referral VBCI Care Management   Essential hypertension   BP Readings from Last 1 Encounters:  08/05/24 138/68   Well-controlled for his age Counseled for compliance with the medications Advised DASH diet and ambulate as tolerated      Relevant Orders   AMB Referral VBCI Care Management     Endocrine   Hypothyroidism   Lab Results  Component Value Date   TSH 3.100 01/07/2024   Continue levothyroxine  75 mcg QD Check TSH and free T4      Relevant Orders   TSH + free T4   AMB Referral VBCI Care Management     Other   Hyperlipidemia   Continue  lovastatin  20 mg QD      Prediabetes   Lab Results  Component Value Date   HGBA1C 5.9 (H) 04/13/2024   Continue to follow low-carb diet      Relevant Medications   dapagliflozin  propanediol (FARXIGA ) 5 MG TABS tablet   Encounter for medication review and counseling   Check medications and reviewed with the patient Advised to remain compliant to the medication regimen Referred to clinical pharmacist for medication adherence counseling and patient assistance program       Meds ordered this encounter  Medications   dapagliflozin  propanediol (FARXIGA ) 5 MG TABS tablet    Sig: Take 1 tablet (5 mg total) by mouth daily.    Lot Number?:   [id number]    Expiration Date?:   05/13/2026    Quantity:   2  Follow-up: Return if symptoms worsen or fail to improve.    Suzzane MARLA Blanch, MD

## 2024-08-06 ENCOUNTER — Ambulatory Visit: Payer: Self-pay | Admitting: Internal Medicine

## 2024-08-06 ENCOUNTER — Telehealth: Payer: Self-pay

## 2024-08-06 ENCOUNTER — Other Ambulatory Visit: Payer: Self-pay | Admitting: Internal Medicine

## 2024-08-06 ENCOUNTER — Ambulatory Visit (INDEPENDENT_AMBULATORY_CARE_PROVIDER_SITE_OTHER)

## 2024-08-06 DIAGNOSIS — Z23 Encounter for immunization: Secondary | ICD-10-CM

## 2024-08-06 DIAGNOSIS — E039 Hypothyroidism, unspecified: Secondary | ICD-10-CM

## 2024-08-06 DIAGNOSIS — Z7189 Other specified counseling: Secondary | ICD-10-CM | POA: Insufficient documentation

## 2024-08-06 LAB — TSH+FREE T4
Free T4: 1.51 ng/dL (ref 0.82–1.77)
TSH: 7.76 u[IU]/mL — ABNORMAL HIGH (ref 0.450–4.500)

## 2024-08-06 MED ORDER — LEVOTHYROXINE SODIUM 88 MCG PO TABS: 88.0000 ug | ORAL_TABLET | Freq: Every day | ORAL | 1 refills | Status: AC

## 2024-08-06 NOTE — Assessment & Plan Note (Signed)
 Check medications and reviewed with the patient Advised to remain compliant to the medication regimen Referred to clinical pharmacist for medication adherence counseling and patient assistance program

## 2024-08-06 NOTE — Progress Notes (Signed)
 Care Guide Pharmacy Note  08/06/2024 Name: [name] MRN: [id number] DOB: 12/20/1940  Referred By: Antonetta Rollene BRAVO, MD Reason for referral: Complex Care Management (Outreach to schedule with Pharm d )   [name] is a 83 y.o. year old male who is a primary care patient of Antonetta Rollene BRAVO, MD.  [name] was referred to the pharmacist for assistance related to: CHF and HTN  An unsuccessful telephone outreach was attempted today to contact the patient who was referred to the pharmacy team for assistance with medication assistance. Additional attempts will be made to contact the patient.  [name] , RMA     [facility]  [facility], [facility] Guide  Direct Dial: [phone]  Website: [email or web address]

## 2024-08-06 NOTE — Assessment & Plan Note (Addendum)
 Lab Results  Component Value Date   TSH 3.100 01/07/2024   Continue levothyroxine  75 mcg QD Check TSH and free T4

## 2024-08-06 NOTE — Assessment & Plan Note (Signed)
 BP Readings from Last 1 Encounters:  08/05/24 138/68   Well-controlled for his age Counseled for compliance with the medications Advised DASH diet and ambulate as tolerated

## 2024-08-06 NOTE — Assessment & Plan Note (Signed)
 Continue lovastatin  20 mg QD

## 2024-08-06 NOTE — Assessment & Plan Note (Signed)
 Lab Results  Component Value Date   HGBA1C 5.9 (H) 04/13/2024   Continue to follow low-carb diet

## 2024-08-10 ENCOUNTER — Other Ambulatory Visit: Payer: Self-pay | Admitting: Family Medicine

## 2024-08-16 NOTE — Progress Notes (Unsigned)
 Care Guide Pharmacy Note  08/16/2024 Name: [name] MRN: [id number] DOB: 07-21-1941  Referred By: Antonetta Rollene BRAVO, MD Reason for referral: Complex Care Management (Outreach to schedule with Pharm d )   [name] is a 83 y.o. year old male who is a primary care patient of Antonetta Rollene BRAVO, MD.  [name] was referred to the pharmacist for assistance related to: CHF and HTN  A second unsuccessful telephone outreach was attempted today to contact the patient who was referred to the pharmacy team for assistance with medication assistance. Additional attempts will be made to contact the patient.  [name] , RMA     [facility]  [facility], [facility] Guide  Direct Dial: [phone]  Website: [email or web address]

## 2024-08-16 NOTE — Telephone Encounter (Signed)
 Patients visit was completed same day

## 2024-08-19 ENCOUNTER — Encounter (INDEPENDENT_AMBULATORY_CARE_PROVIDER_SITE_OTHER): Payer: Self-pay | Admitting: Gastroenterology

## 2024-08-20 NOTE — Progress Notes (Signed)
 Care Guide Pharmacy Note  08/20/2024 Name: [name] MRN: [id number] DOB: 08/05/1941  Referred By: Antonetta Rollene BRAVO, MD Reason for referral: Complex Care Management (Outreach to schedule with Pharm d )   [name] is a 83 y.o. year old male who is a primary care patient of Antonetta Rollene BRAVO, MD.  [name] was referred to the pharmacist for assistance related to: CHF and HTN  A third unsuccessful telephone outreach was attempted today to contact the patient who was referred to the pharmacy team for assistance with medication assistance. The Population Health team is pleased to engage with this patient at any time in the future upon receipt of referral and should he/she be interested in assistance from the [employer] [facility] Health team.  [name] , RMA     [facility]  [facility], [facility] Guide  Direct Dial: [phone]  Website: [email or web address]

## 2024-08-24 DIAGNOSIS — H35033 Hypertensive retinopathy, bilateral: Secondary | ICD-10-CM | POA: Diagnosis not present

## 2024-08-24 DIAGNOSIS — H25813 Combined forms of age-related cataract, bilateral: Secondary | ICD-10-CM | POA: Diagnosis not present

## 2024-08-24 DIAGNOSIS — H35363 Drusen (degenerative) of macula, bilateral: Secondary | ICD-10-CM | POA: Diagnosis not present

## 2024-08-24 DIAGNOSIS — H40052 Ocular hypertension, left eye: Secondary | ICD-10-CM | POA: Diagnosis not present

## 2024-08-24 DIAGNOSIS — H524 Presbyopia: Secondary | ICD-10-CM | POA: Diagnosis not present

## 2024-08-24 DIAGNOSIS — H18893 Other specified disorders of cornea, bilateral: Secondary | ICD-10-CM | POA: Diagnosis not present

## 2024-08-24 LAB — OPHTHALMOLOGY REPORT-SCANNED

## 2024-09-03 ENCOUNTER — Telehealth: Payer: Self-pay

## 2024-09-03 ENCOUNTER — Encounter: Payer: Self-pay | Admitting: Internal Medicine

## 2024-09-03 ENCOUNTER — Ambulatory Visit: Attending: Internal Medicine | Admitting: Internal Medicine

## 2024-09-03 ENCOUNTER — Other Ambulatory Visit (HOSPITAL_COMMUNITY): Payer: Self-pay

## 2024-09-03 ENCOUNTER — Telehealth: Payer: Self-pay | Admitting: Pharmacy Technician

## 2024-09-03 ENCOUNTER — Encounter: Payer: Self-pay | Admitting: Oncology

## 2024-09-03 VITALS — BP 161/87 | HR 74 | Ht 67.0 in | Wt 146.8 lb

## 2024-09-03 DIAGNOSIS — R7303 Prediabetes: Secondary | ICD-10-CM

## 2024-09-03 DIAGNOSIS — Z5181 Encounter for therapeutic drug level monitoring: Secondary | ICD-10-CM | POA: Diagnosis not present

## 2024-09-03 DIAGNOSIS — Z79899 Other long term (current) drug therapy: Secondary | ICD-10-CM

## 2024-09-03 MED ORDER — FUROSEMIDE 20 MG PO TABS: 20.0000 mg | ORAL_TABLET | Freq: Every day | ORAL | 3 refills | Status: AC | PRN

## 2024-09-03 MED ORDER — DAPAGLIFLOZIN PROPANEDIOL 5 MG PO TABS: 5.0000 mg | ORAL_TABLET | Freq: Every day | ORAL | 11 refills | Status: AC

## 2024-09-03 NOTE — Telephone Encounter (Signed)
 Patient notified he was given a grant to get Farxiga . He will return in 1 week for bmet here at [facility]

## 2024-09-03 NOTE — Telephone Encounter (Signed)
 Patient Advocate Encounter   The patient was approved for a [employer] grant that will help cover the cost of jardiance Total amount awarded, 7500.  Effective: 08/04/24 - 08/03/25   APW:[id number] ERW:[id number] Hmnle:[id number] PI:[id number] [employer] ID: [id number]   Pharmacy provided with approval and processing information.

## 2024-09-03 NOTE — Patient Instructions (Signed)
 Medication Instructions:   Take Lasix  20 mg daily as needed for swelling  Labwork: None today  Testing/Procedures: Your physician has recommended that you have a pulmonary function test. Pulmonary Function Tests are a group of tests that measure how well air moves in and out of your lungs.   Follow-Up: 1 year Dr.Mallipeddi  Any Other Special Instructions Will Be Listed Below (If Applicable).  If you need a refill on your cardiac medications before your next appointment, please call your pharmacy.

## 2024-09-03 NOTE — Telephone Encounter (Signed)
    Ran test claim for jardiance. For a 30 day supply and the co-pay is 47.00 . PA is not needed at this time. This test claim was processed through [facility]- copay amounts may vary at other pharmacies due to pharmacy/plan contracts, or as the patient moves through the different stages of their insurance plan.      I can get him a grant if that is too expensive

## 2024-09-03 NOTE — Progress Notes (Addendum)
 Cardiology Office Note  Date: 09/03/2024   ID: [name] 04-Sep-1941, MRN [id number]  PCP:  Antonetta Rollene BRAVO, MD  Cardiologist:  Diannah SHAUNNA Maywood, MD Electrophysiologist:  Danelle Birmingham, MD    History of Present Illness: [name] is a 83 y.o. male known to have chronic systolic heart failure (LVEF improved from 25 to 30% in 2023 to 45% in 2025), frequent PVC burden (initially it was for 24%, now resolved on amiodarone ), HTN, HLD, hypothyroidism is here today for follow-up visit.  Currently on p.o. amiodarone  200 mg once daily for 6 days a week.  Follows up with electrophysiology.  Plan is to switch to 5 days regimen next year.  Denies having any angina or DOE.  He is asking to refill Farxiga  as he did notice improvement in his stamina levels last year.  Due to cost issues, it had to be discontinued.  No dizziness, syncope, palpitations.  He did report leg swelling.  Currently on amiodarone .  TSH elevated but free T4 normal.  Recently saw his eye doctor, had retinopathy.  No corneal issues.  No PFTs on file.  Past Medical History:  Diagnosis Date   Allergic rhinitis, seasonal    BPH (benign prostatic hypertrophy)    Erectile dysfunction    Heart failure with improved ejection fraction (HFimpEF) (HCC)    a. EF 25-30% by echo in 04/2022 with cath showing normal cors b. EF at 30-35% in 10/2022 c. EF 40-45% in 06/2023 and cardiomyopathy felt to be due to PVC's   History of kidney stones    Hx of thyroidectomy    Hyperlipidemia    Hypertension    Hypothyroidism    Pre-diabetes    Prostate cancer (HCC) 2011   treated with radiation   PVC's (premature ventricular contractions)    Urosepsis 07/2011   ICU admission [facility]    Past Surgical History:  Procedure Laterality Date   COLONOSCOPY N/A 09/03/2013   Procedure: COLONOSCOPY;  Surgeon: Claudis RAYMOND Rivet, MD;  Location: [facility];  Service: Endoscopy;  Laterality: N/A;  1225   COLONOSCOPY N/A  11/08/2015   Procedure: COLONOSCOPY;  Surgeon: Claudis RAYMOND Rivet, MD;  Location: [facility];  Service: Endoscopy;  Laterality: N/A;  1200   COLONOSCOPY N/A 06/04/2024   Procedure: COLONOSCOPY;  Surgeon: Eartha Angelia Sieving, MD;  Location: [facility];  Service: Gastroenterology;  Laterality: N/A;   ESOPHAGOGASTRODUODENOSCOPY N/A 06/04/2024   Procedure: EGD (ESOPHAGOGASTRODUODENOSCOPY);  Surgeon: Eartha Angelia, Sieving, MD;  Location: [facility];  Service: Gastroenterology;  Laterality: N/A;   MASS EXCISION N/A 08/15/2017   Procedure: EXCISION SEBACEOUS CYST NECK;  Surgeon: Mavis Anes, MD;  Location: [facility];  Service: General;  Laterality: N/A;   PROSTATE SURGERY     RIGHT/LEFT HEART CATH AND CORONARY ANGIOGRAPHY N/A 04/30/2022   Procedure: RIGHT/LEFT HEART CATH AND CORONARY ANGIOGRAPHY;  Surgeon: Jordan, Peter M, MD;  Location: [facility] INVASIVE CV LAB;  Service: Cardiovascular;  Laterality: N/A;   THYROIDECTOMY  2003   transurethral resection of thr prostate  2003   ureteral stone extraction  08/2011    Current Outpatient Medications  Medication Sig Dispense Refill   amiodarone  (PACERONE ) 200 MG tablet Take 200 mg once daily except do not take on Sundays. 180 tablet 3   aspirin  EC 81 MG tablet Take 1 tablet (81 mg total) by mouth daily. 30 tablet 5   cetirizine  (ZYRTEC ) 10 MG tablet TAKE 1 TABLET EVERY DAY 90 tablet 10  dapagliflozin  propanediol (FARXIGA ) 10 MG TABS tablet Take 1 tablet (10 mg total) by mouth daily before breakfast.     dapagliflozin  propanediol (FARXIGA ) 5 MG TABS tablet Take 1 tablet (5 mg total) by mouth daily.     EPINEPHrine  0.3 mg/0.3 mL IJ SOAJ injection Inject 0.3 mLs (0.3 mg total) into the muscle as needed for anaphylaxis. 1 each 2   ferrous sulfate  325 (65 FE) MG tablet Take 1 tablet (325 mg total) by mouth every other day. 30 tablet 0   levothyroxine  (SYNTHROID ) 88 MCG tablet Take 1 tablet (88 mcg total) by mouth daily. 90 tablet 1   losartan  (COZAAR )  50 MG tablet Take half tablet by mouth once daily for blood pressure 30 tablet 2   lovastatin  (MEVACOR ) 20 MG tablet Take 20 mg by mouth at bedtime.     metoprolol  succinate (TOPROL -XL) 50 MG 24 hr tablet Take 1 tablet (50 mg total) by mouth daily. 90 tablet 1   mirtazapine  (REMERON ) 7.5 MG tablet Take 0.5 tablets (3.75 mg total) by mouth at bedtime.     Multiple Vitamin (MULTIVITAMIN WITH MINERALS) TABS tablet Take 1 tablet by mouth daily.     spironolactone  (ALDACTONE ) 25 MG tablet Take 12.5 mg by mouth daily.     tadalafil  (CIALIS ) 5 MG tablet TAKE ONE TABLET BY MOUTH ONCE DAILY. 30 tablet 2   tamsulosin  (FLOMAX ) 0.4 MG CAPS capsule Take 1 capsule (0.4 mg total) by mouth daily. 30 capsule 5   No current facility-administered medications for this visit.   Allergies:  Peanut-containing drug products, Egg protein-containing drug products, Iodine , Penicillins, and Shellfish allergy   Social History: The patient  reports that he has never smoked. He has never been exposed to tobacco smoke. He has never used smokeless tobacco. He reports that he does not drink alcohol and does not use drugs.   Family History: The patient's family history includes Diabetes in his brother, brother, and father; Gout in his brother; Hypertension in his father; Kidney disease in his brother; Stroke in his brother and mother.   ROS:  Please see the history of present illness. Otherwise, complete review of systems is positive for none.  All other systems are reviewed and negative.   Physical Exam: VS:  BP (!) 170/90 (BP Location: Right Arm)   Pulse 74   Ht 5' 7 (1.702 m)   Wt 146 lb 12.8 oz (66.6 kg)   SpO2 98%   BMI 22.99 kg/m , BMI Body mass index is 22.99 kg/m.  Wt Readings from Last 3 Encounters:  09/03/24 146 lb 12.8 oz (66.6 kg)  08/05/24 146 lb 9.6 oz (66.5 kg)  08/02/24 142 lb (64.4 kg)    General: Patient appears comfortable at rest. HEENT: Conjunctiva and lids normal, oropharynx clear with moist  mucosa. Neck: Supple, no elevated JVP or carotid bruits, no thyromegaly. Lungs: Clear to auscultation, nonlabored breathing at rest. Cardiac: Regular rate and rhythm, no S3 or significant systolic murmur, no pericardial rub. Abdomen: Soft, nontender, no hepatomegaly, bowel sounds present, no guarding or rebound. Extremities: 1-2+ pitting edema, distal pulses 2+. Skin: Warm and dry. Musculoskeletal: No kyphosis. Neuropsychiatric: Alert and oriented x3, affect grossly appropriate.  ECG:  NSR and PVC on 10/25/22  Recent Labwork: 06/05/2024: Magnesium  2.3 07/23/2024: ALT 5; AST 15; BUN 14; Creatinine, Ser 1.49; Hemoglobin 9.7; Platelets 254; Potassium 4.0; Sodium 145 08/05/2024: TSH 7.760     Component Value Date/Time   CHOL 159 04/13/2024 1339   TRIG 51 04/13/2024  1339   HDL 70 04/13/2024 1339   CHOLHDL 1.9 06/11/2023 1121   CHOLHDL 2.2 04/03/2020 1122   VLDL 15 01/06/2017 1416   LDLCALC 78 04/13/2024 1339   LDLCALC 64 04/03/2020 1122    Assessment and Plan:  # Chronic systolic heart failure # NICM LVEF 45% (improved from 25 to 30% in 2023) - No DOE but has leg swelling, 1+ pitting edema.  Currently not on any diuretics.  Start p.o. Lasix  20 mg as needed for leg swelling/DOE. - Continue metoprolol  succinate 50 mg once daily. - Continue losartan  50 mg once daily. - Continue spironolactone  12.5 mg once daily. - Previously on Farxiga  that improved his quality of life.  Will restart.  Previously had cost issues.  Will reach out to patient assistance program.  Obtain BMP in 5 days after starting Farxiga . - LHC in May 2023 showed angiographically normal coronaries and normal LV filling pressures. - LV systolic dysfunction likely secondary to frequent PVCs.  Improved after suppressing PVCs.  # PVC burden 24%, resolved - PVCs completely suppressed with amiodarone  therapy.  Continue p.o. amiodarone  200 mg once daily for 6 days a week.  Follows up with electrophysiology.  # Chronic  amiodarone  therapy/drug monitoring - TSH mildly elevated but free T4 within normal limits, on levothyroxine  therapy chronically.  Ophthalmology evaluation performed recently, no corneal deposits.  He had retinopathy.  PFTs not on file.  Will obtain PFTs.  40-minute spent in reviewing prior medical records, specialist notes, more than 3 labs, discussion and documentation.  Amiodarone  monitoring required.  Medication Changes: No orders of the defined types were placed in this encounter.   Disposition:  Follow up 1 year  Signed, Reinette Cuneo Arleta Maywood, MD, 09/03/2024 10:47 AM    [facility] [address], [place], [place] [address]

## 2024-09-03 NOTE — Telephone Encounter (Signed)
-----   Message from [facility] sent at 09/03/2024  1:45 PM EST ----- Regarding: RE: help with jardiance I got him a grant and sent it to [employer] if the prescription can be sent there please  Effective: 08/04/24 - 08/03/25   APW:[id number] ERW:[id number] Hmnle:[id number] PI:[id number] ----- Message ----- From: Bernett Dorothyann LABOR, RN Sent: 09/03/2024  12:25 PM EST To: Corean Maiden, CPhT Subject: RE: help with jardiance                        Can we get him assistance? I know money is tight for them, thanks ----- Message ----- From: Maiden Corean, CPhT Sent: 09/03/2024  11:46 AM EST To: Dorothyann LABOR Bernett, RN Subject: RE: help with jardiance                        Ran test claim for jardiance. For a 30 day supply and the co-pay is 47.00 . PA is not needed at this time. This test claim was processed through [facility]- copay amounts may vary at other pharmacies due to pharmacy/plan contracts, or as the patient moves through the different stages of their insurance plan.      I can get him a grant if that is too expensive ----- Message ----- From: Bernett Dorothyann LABOR, RN Sent: 09/03/2024  11:26 AM EST To: Rx Med Assistance Team Subject: help with jardiance                            Per [email or web address], needs help

## 2024-09-03 NOTE — Addendum Note (Signed)
 Addended by: Conrado Nance A on: 09/03/2024 12:39 PM   Modules accepted: Orders

## 2024-09-12 ENCOUNTER — Other Ambulatory Visit: Payer: Self-pay | Admitting: Family Medicine

## 2024-09-14 ENCOUNTER — Other Ambulatory Visit: Payer: Self-pay

## 2024-09-14 DIAGNOSIS — D500 Iron deficiency anemia secondary to blood loss (chronic): Secondary | ICD-10-CM

## 2024-09-15 ENCOUNTER — Other Ambulatory Visit (HOSPITAL_COMMUNITY)
Admission: RE | Admit: 2024-09-15 | Discharge: 2024-09-15 | Disposition: A | Discharge status: Home / Self Care | Source: Ambulatory Visit | Attending: Internal Medicine | Admitting: Internal Medicine

## 2024-09-15 ENCOUNTER — Inpatient Hospital Stay: Attending: Oncology

## 2024-09-15 DIAGNOSIS — D500 Iron deficiency anemia secondary to blood loss (chronic): Secondary | ICD-10-CM

## 2024-09-15 DIAGNOSIS — Z79899 Other long term (current) drug therapy: Secondary | ICD-10-CM | POA: Insufficient documentation

## 2024-09-15 DIAGNOSIS — D509 Iron deficiency anemia, unspecified: Secondary | ICD-10-CM | POA: Insufficient documentation

## 2024-09-15 LAB — CBC WITH DIFFERENTIAL/PLATELET
Abs Immature Granulocytes: 0.01 K/uL (ref 0.00–0.07)
Basophils Absolute: 0 K/uL (ref 0.0–0.1)
Basophils Relative: 1 %
Eosinophils Absolute: 0.4 K/uL (ref 0.0–0.5)
Eosinophils Relative: 11 %
HCT: 43.9 % (ref 39.0–52.0)
Hemoglobin: 13.3 g/dL (ref 13.0–17.0)
Immature Granulocytes: 0 %
Lymphocytes Relative: 29 %
Lymphs Abs: 1 K/uL (ref 0.7–4.0)
MCH: 26 pg (ref 26.0–34.0)
MCHC: 30.3 g/dL (ref 30.0–36.0)
MCV: 85.9 fL (ref 80.0–100.0)
Monocytes Absolute: 0.2 K/uL (ref 0.1–1.0)
Monocytes Relative: 7 %
Neutro Abs: 1.9 K/uL (ref 1.7–7.7)
Neutrophils Relative %: 52 %
Platelets: 163 K/uL (ref 150–400)
RBC: 5.11 MIL/uL (ref 4.22–5.81)
RDW: 19.6 % — ABNORMAL HIGH (ref 11.5–15.5)
WBC: 3.5 K/uL — ABNORMAL LOW (ref 4.0–10.5)
nRBC: 0 % (ref 0.0–0.2)

## 2024-09-15 LAB — COMPREHENSIVE METABOLIC PANEL WITH GFR
ALT: 8 U/L (ref 0–44)
AST: 18 U/L (ref 15–41)
Albumin: 4.4 g/dL (ref 3.5–5.0)
Alkaline Phosphatase: 62 U/L (ref 38–126)
Anion gap: 13 (ref 5–15)
BUN: 17 mg/dL (ref 8–23)
CO2: 24 mmol/L (ref 22–32)
Calcium: 9.2 mg/dL (ref 8.9–10.3)
Chloride: 106 mmol/L (ref 98–111)
Creatinine, Ser: 1.27 mg/dL — ABNORMAL HIGH (ref 0.61–1.24)
GFR, Estimated: 55 mL/min — ABNORMAL LOW (ref >60)
Glucose, Bld: 118 mg/dL — ABNORMAL HIGH (ref 70–99)
Potassium: 3.9 mmol/L (ref 3.5–5.1)
Sodium: 143 mmol/L (ref 135–145)
Total Bilirubin: 0.5 mg/dL (ref 0.0–1.2)
Total Protein: 7.1 g/dL (ref 6.5–8.1)

## 2024-09-15 LAB — IRON AND TIBC
Iron: 44 ug/dL — ABNORMAL LOW (ref 45–182)
Saturation Ratios: 18 % (ref 17.9–39.5)
TIBC: 249 ug/dL — ABNORMAL LOW (ref 250–450)
UIBC: 206 ug/dL

## 2024-09-15 LAB — BASIC METABOLIC PANEL WITH GFR
Anion gap: 12 (ref 5–15)
BUN: 18 mg/dL (ref 8–23)
CO2: 26 mmol/L (ref 22–32)
Calcium: 9.3 mg/dL (ref 8.9–10.3)
Chloride: 106 mmol/L (ref 98–111)
Creatinine, Ser: 1.34 mg/dL — ABNORMAL HIGH (ref 0.61–1.24)
GFR, Estimated: 52 mL/min — ABNORMAL LOW (ref >60)
Glucose, Bld: 130 mg/dL — ABNORMAL HIGH (ref 70–99)
Potassium: 4.3 mmol/L (ref 3.5–5.1)
Sodium: 143 mmol/L (ref 135–145)

## 2024-09-15 LAB — FERRITIN: Ferritin: 159 ng/mL (ref 24–336)

## 2024-09-22 ENCOUNTER — Inpatient Hospital Stay: Admitting: Oncology

## 2024-10-05 ENCOUNTER — Encounter: Payer: Self-pay | Admitting: Family Medicine

## 2024-10-08 ENCOUNTER — Ambulatory Visit: Payer: Self-pay | Admitting: Internal Medicine

## 2024-10-12 ENCOUNTER — Ambulatory Visit (HOSPITAL_COMMUNITY)
Admission: RE | Admit: 2024-10-12 | Discharge: 2024-10-12 | Disposition: A | Source: Ambulatory Visit | Attending: Internal Medicine

## 2024-10-12 ENCOUNTER — Other Ambulatory Visit: Payer: Self-pay | Admitting: Internal Medicine

## 2024-10-12 ENCOUNTER — Encounter (INDEPENDENT_AMBULATORY_CARE_PROVIDER_SITE_OTHER): Payer: Self-pay | Admitting: Gastroenterology

## 2024-10-12 DIAGNOSIS — Z79899 Other long term (current) drug therapy: Secondary | ICD-10-CM | POA: Diagnosis present

## 2024-10-12 LAB — PULMONARY FUNCTION TEST
DL/VA % pred: 114 %
DL/VA: 4.38 ml/min/mmHg/L
DLCO unc % pred: 99 %
DLCO unc: 21.21 ml/min/mmHg
FEF 25-75 Pre: 1.36 L/s
FEF2575-%Pred-Pre: 93 %
FEV1-%Pred-Pre: 107 %
FEV1-Pre: 2.46 L
FEV1FVC-%Pred-Pre: 96 %
FEV6-%Pred-Pre: 115 %
FEV6-Pre: 3.51 L
FEV6FVC-%Pred-Pre: 105 %
FVC-%Pred-Pre: 108 %
FVC-Pre: 3.6 L
Pre FEV1/FVC ratio: 68 %
Pre FEV6/FVC Ratio: 97 %
RV % pred: 73 %
RV: 1.91 L
TLC % pred: 80 %
TLC: 5.19 L

## 2024-10-24 ENCOUNTER — Other Ambulatory Visit: Payer: Self-pay | Admitting: Family Medicine

## 2024-10-29 ENCOUNTER — Encounter: Admitting: Family Medicine

## 2024-12-10 ENCOUNTER — Encounter: Payer: Self-pay | Admitting: Family Medicine

## 2025-08-03 ENCOUNTER — Ambulatory Visit: Payer: Self-pay

## 2025-08-03 ENCOUNTER — Ambulatory Visit

## 2025-08-05 ENCOUNTER — Ambulatory Visit
